# Patient Record
Sex: Female | Born: 1950
Health system: Southern US, Community
[De-identification: ages and names within clinical notes are randomized; demographics above are authoritative.]

## PROBLEM LIST (undated history)

## (undated) DIAGNOSIS — J329 Chronic sinusitis, unspecified: Secondary | ICD-10-CM

## (undated) DIAGNOSIS — E039 Hypothyroidism, unspecified: Secondary | ICD-10-CM

## (undated) DIAGNOSIS — E785 Hyperlipidemia, unspecified: Secondary | ICD-10-CM

## (undated) DIAGNOSIS — G4700 Insomnia, unspecified: Secondary | ICD-10-CM

## (undated) DIAGNOSIS — K649 Unspecified hemorrhoids: Secondary | ICD-10-CM

## (undated) DIAGNOSIS — R519 Headache, unspecified: Secondary | ICD-10-CM

## (undated) DIAGNOSIS — M8580 Other specified disorders of bone density and structure, unspecified site: Secondary | ICD-10-CM

## (undated) DIAGNOSIS — F411 Generalized anxiety disorder: Secondary | ICD-10-CM

## (undated) DIAGNOSIS — F909 Attention-deficit hyperactivity disorder, unspecified type: Secondary | ICD-10-CM

## (undated) DIAGNOSIS — R112 Nausea with vomiting, unspecified: Secondary | ICD-10-CM

## (undated) DIAGNOSIS — I10 Essential (primary) hypertension: Secondary | ICD-10-CM

## (undated) DIAGNOSIS — F32A Depression, unspecified: Secondary | ICD-10-CM

## (undated) DIAGNOSIS — J302 Other seasonal allergic rhinitis: Secondary | ICD-10-CM

## (undated) DIAGNOSIS — R7303 Prediabetes: Secondary | ICD-10-CM

## (undated) HISTORY — DX: Hyperlipidemia, unspecified: E78.5

## (undated) HISTORY — PX: AUGMENTATION MAMMAPLASTY: SUR837

## (undated) HISTORY — DX: Other specified disorders of bone density and structure, unspecified site: M85.80

## (undated) HISTORY — DX: Hypothyroidism, unspecified: E03.9

## (undated) HISTORY — DX: Generalized anxiety disorder: F41.1

## (undated) HISTORY — PX: LASIK: SHX215

## (undated) HISTORY — PX: VAGINAL HYSTERECTOMY: SHX2639

## (undated) HISTORY — DX: Chronic sinusitis, unspecified: J32.9

---

## 2003-02-24 ENCOUNTER — Other Ambulatory Visit: Admission: RE | Admit: 2003-02-24 | Discharge: 2003-02-24 | Payer: Self-pay | Admitting: Obstetrics and Gynecology

## 2003-02-26 ENCOUNTER — Encounter: Payer: Self-pay | Admitting: Obstetrics and Gynecology

## 2003-02-26 ENCOUNTER — Encounter: Admission: RE | Admit: 2003-02-26 | Discharge: 2003-02-26 | Payer: Self-pay | Admitting: Obstetrics and Gynecology

## 2004-04-19 ENCOUNTER — Other Ambulatory Visit: Admission: RE | Admit: 2004-04-19 | Discharge: 2004-04-19 | Payer: Self-pay | Admitting: Obstetrics and Gynecology

## 2004-04-22 ENCOUNTER — Encounter: Admission: RE | Admit: 2004-04-22 | Discharge: 2004-04-22 | Payer: Self-pay | Admitting: Obstetrics and Gynecology

## 2004-07-10 ENCOUNTER — Emergency Department: Payer: Self-pay | Admitting: Emergency Medicine

## 2004-07-25 ENCOUNTER — Ambulatory Visit (HOSPITAL_COMMUNITY): Admission: RE | Admit: 2004-07-25 | Discharge: 2004-07-25 | Payer: Self-pay | Admitting: Gastroenterology

## 2005-05-23 ENCOUNTER — Other Ambulatory Visit: Admission: RE | Admit: 2005-05-23 | Discharge: 2005-05-23 | Payer: Self-pay | Admitting: Obstetrics and Gynecology

## 2006-01-25 ENCOUNTER — Emergency Department (HOSPITAL_COMMUNITY): Admission: EM | Admit: 2006-01-25 | Discharge: 2006-01-25 | Payer: Self-pay | Admitting: Emergency Medicine

## 2006-03-18 ENCOUNTER — Emergency Department: Payer: Self-pay | Admitting: Emergency Medicine

## 2006-11-15 ENCOUNTER — Encounter: Admission: RE | Admit: 2006-11-15 | Discharge: 2006-11-15 | Payer: Self-pay | Admitting: Obstetrics and Gynecology

## 2010-03-16 ENCOUNTER — Ambulatory Visit: Payer: Self-pay | Admitting: Family Medicine

## 2010-03-16 DIAGNOSIS — H68019 Acute Eustachian salpingitis, unspecified ear: Secondary | ICD-10-CM | POA: Insufficient documentation

## 2010-05-20 ENCOUNTER — Ambulatory Visit: Payer: Self-pay | Admitting: Family Medicine

## 2010-07-10 ENCOUNTER — Ambulatory Visit: Payer: Self-pay | Admitting: Family Medicine

## 2010-07-10 DIAGNOSIS — J309 Allergic rhinitis, unspecified: Secondary | ICD-10-CM | POA: Insufficient documentation

## 2010-10-02 ENCOUNTER — Ambulatory Visit
Admission: RE | Admit: 2010-10-02 | Discharge: 2010-10-02 | Payer: Self-pay | Source: Home / Self Care | Attending: Family Medicine | Admitting: Family Medicine

## 2010-10-25 NOTE — Assessment & Plan Note (Signed)
Summary: EAR ACHE, SINUS/EVM   Vital Signs:  Patient profile:   60 year old female Height:      62.25 inches Weight:      135 pounds O2 Sat:      96 % on Room air Temp:     97.6 degrees F oral Pulse rate:   77 / minute Pulse rhythm:   regular Resp:     18 per minute BP sitting:   119 / 81  (right arm)  Vitals Entered By: Levonne Spiller EMT-P (May 20, 2010 11:42 AM)  O2 Flow:  Room air  Reason for Visit Ear Ache, Sinuses / rwt  Allergies (verified): No Known Drug Allergies   Complete Medication List: 1)  Celexa 10 Mg Tabs (Citalopram hydrobromide) 2)  Lipitor 10 Mg Tabs (Atorvastatin calcium) 3)  Azithromycin 250 Mg Tabs (Azithromycin) .... 2 by mouth day 1 then 1 by mouth daily x 7 days for infection  Other Orders: Solumedrol up to 125mg  (Z6109) Admin of Therapeutic Inj (IM or [place]) ([address]) Prescriptions: AZITHROMYCIN 250 MG TABS (AZITHROMYCIN) 2 by mouth day 1 then 1 by mouth daily x 7 days for infection  #9 x 0   Entered and Authorized by:   Tacey Ruiz MD   Signed by:   Tacey Ruiz MD on 05/20/2010   Method used:   [facility] to        [id number]       [address]       [place], [place]  [address]       Ph: [phone]       Fax: [phone]   RxID:   [id number]   History of Present Illness History from: patient Reason for visit: see chief complaint Chief Complaint: Ear Pain/sinus pressure History of Present Illness: For about a week patient has had pain and pressure behind her eyes and swelling. She has had bilateral ear pain, L > R. Has had thick post nasal drainage. No f/c. + halitosis and coated tongue. Has been around her 51 month old grandson and he was sick. Did feel better since last visit. She is using nasonex and claritin daily. Reports that pain is worse this time.     Plan New Medications/Changes: AZITHROMYCIN 250 MG TABS (AZITHROMYCIN) 2 by mouth day 1 then 1 by mouth daily x 7 days for infection  #9 x 0,  05/20/2010, Tacey Ruiz MD  New Orders: Solumedrol up to 125mg  [J2930] Admin of Therapeutic Inj (IM or [place]) [[address]644] Est. Patient Level II [03474] Planning Comments:   She was told that she may continue to take mucinex with increased fluid intake, nasal spray and loratadine. If the pain continues or recurs then it was suggested that she follow with her ENT.   The patient and/or caregiver has been counseled thoroughly with regard to medications prescribed including dosage, schedule, interactions, rationale for use, and possible side effects and they verbalize understanding.  Diagnoses and expected course of recovery discussed and will return if not improved as expected or if the condition worsens. Patient and/or caregiver verbalized understanding.       Medication Administration  Injection # 1:    Medication: Solumedrol up to 125mg     Diagnosis: ACUTE EUSTACHIAN SALPINGITIS (ICD-381.51)    Route: IM    Site: RUOQ gluteus    Exp Date: 10/25/2012    Lot #: Velva Harman    Mfr: Pfizer    Patient tolerated injection without complications    Given by:  Levonne Spiller EMT-P (May 20, 2010 12:06 PM)  Orders Added: 1)  Solumedrol up to 125mg  [J2930] 2)  Admin of Therapeutic Inj (IM or [place]) [96372] 3)  Est. Patient Level II [47425]     REVIEW OF SYSTEMS Constitutional Symptoms       Complains of fatigue.     Denies fever, chills, and night sweats.  Eyes       Complains of eye drainage.      Denies change in vision and eye pain. Ear/Nose/Throat/Mouth       Complains of ear pain, frequent runny nose, sinus problems, and hoarseness.      Denies ear discharge, dizziness, frequent nose bleeds, sore throat, and tooth pain or bleeding.  Respiratory       Denies dry cough, productive cough, wheezing, and shortness of breath.     Neurological       Complains of headaches.     Physical Exam General appearance: well developed, well nourished, no acute distress Eyes: swollen lids and below eyes +  swollen Ears: bilateral TMs retracted - left canal with erythema and painful bulge on bottom - tender with tug on the ear Oral/Pharynx: tongue normal, posterior pharynx without erythema or exudate Neck: neck supple,  trachea midline, no masses - tender below both ears, no nodes appreciated.  Chest/Lungs: no rales, wheezes, or rhonchi bilateral, breath sounds equal without effort Heart: regular rate and  rhythm, no murmur Skin: no obvious rashes or lesions MSE: oriented to time, place, and person   The patient was informed that there is no on-call provider or services available at this clinic during off-hours (when the clinic is closed).  If the patient developed a problem or concern that required immediate attention, the patient was advised to go the the nearest available urgent care or emergency department for medical care.  The patient verbalized understanding.     The risks, benefits and possible side effects of the treatments and tests were explained clearly to the patient and the patient verbalized understanding.

## 2010-10-25 NOTE — Assessment & Plan Note (Signed)
Summary: EAR INFECTION/EVM   Vital Signs:  Patient profile:   60 year old female Height:      62.25 inches Weight:      133 pounds BMI:     24.22 O2 Sat:      97 % on Room air Pulse rate:   78 / minute Resp:     16 per minute BP sitting:   123 / 83  (left arm)  Vitals Entered By: Adella Hare LPN (July 10, 2010 1:40 PM)  O2 Flow:  Room air CC: left ear pain, sinus pressure and drainage x 1 week Is Patient Diabetic? No Pain Assessment Patient in pain? no        Chief Complaint:  left ear pain and sinus pressure and drainage x 1 week.  History of Present Illness: This patient presented today because she's been concerned about left ear pain and sinus pressure with a lot of drainage. She reports that it has been present for approximately 10 days to 2 weeks. She reports that it is not improving. She reports that she typically will get these types of symptoms around this time of the year. She does have a history of allergic rhinitis. She does plan to travel next week to see family members and came to be evaluated for sinus infection. She says that when her teeth start her she knows that she is in trouble for having infection.  She reported that when she got an infection a couple months ago and had a shot of steroid that seemed to help her symptoms considerably. she received this when she saw Dr. Tacey Ruiz.     Current Medications (verified): 1)  Celexa 10 Mg Tabs (Citalopram Hydrobromide) 2)  Lipitor 10 Mg Tabs (Atorvastatin Calcium) 3)  Levothyroxine Sodium 50 Mcg Tabs (Levothyroxine Sodium) .... One Tab By Mouth Once Daily  Allergies (verified): No Known Drug Allergies  Past History:  Past Medical History: Allergic rhinitis history of eustachian tube dysfunction  Review of Systems       The patient complains of hoarseness and headaches.  The patient denies anorexia, fever, weight loss, weight gain, vision loss, decreased hearing, chest pain, syncope, dyspnea on  exertion, peripheral edema, prolonged cough, hemoptysis, abdominal pain, melena, hematochezia, severe indigestion/heartburn, hematuria, incontinence, genital sores, muscle weakness, suspicious skin lesions, transient blindness, difficulty walking, depression, unusual weight change, abnormal bleeding, enlarged lymph nodes, angioedema, breast masses, and testicular masses.         complains of tooth pain, left ear pain and congestion and postnasal drainage ENT:  Complains of decreased hearing, earache, nasal congestion, postnasal drainage, sinus pressure, and sore throat.  Physical Exam  General:  well developed, well nourished, no acute distress Head:  Normocephalic and atraumatic without obvious abnormalities. No apparent alopecia or balding. Eyes:  No corneal or conjunctival inflammation noted. EOMI. Perrla. Funduscopic exam benign, without hemorrhages, exudates or papilledema. Vision grossly normal. Ears:  External ear exam shows no significant lesions or deformities.  Otoscopic examination reveals clear canals, tympanic membranes are intact bilaterally without bulging, retraction, inflammation or discharge. Hearing is grossly normal bilaterally. Nose:  external erythema, nasal dischargemucosal pallor, mucosal erythema, mucosal edema, L frontal sinus tenderness, and L maxillary sinus tenderness.   Mouth:  no gingival abnormalities, fair dentition, pharyngeal exudate, posterior lymphoid hypertrophy, and postnasal drip.   Neck:  neck supple,  trachea midline, no masses - tender below both ears, no nodes appreciated.  Chest Wall:  No deformities, masses, or tenderness noted. Lungs:  Normal  respiratory effort, chest expands symmetrically. Lungs are clear to auscultation, no crackles or wheezes. Heart:  Normal rate and regular rhythm. S1 and S2 normal without gallop, murmur, click, rub or other extra sounds. Abdomen:  Bowel sounds positive,abdomen soft and non-tender without masses, organomegaly or  hernias noted. Pulses:  R and L carotid,radial,femoral,dorsalis pedis and posterior tibial pulses are full and equal bilaterally Neurologic:  No cranial nerve deficits noted. Station and gait are normal. Plantar reflexes are down-going bilaterally. DTRs are symmetrical throughout. Sensory, motor and coordinative functions appear intact.   Impression & Recommendations:  Problem # 1:  ACUTE SINUSITIS, UNSPECIFIED (ICD-461.9)  Her updated medication list for this problem includes:    Amoxicillin 875 Mg Tabs (Amoxicillin) .Marland Kitchen... Take 1 by mouth two times a day until all completed    Flonase 50 Mcg/act Susp (Fluticasone propionate) .Marland Kitchen..Marland Kitchen Two sprays per nostril once daily  Orders: Admin of Therapeutic Inj  intramuscular or subcutaneous (16109)  Problem # 2:  EAR PAIN, LEFT (ICD-388.70)  Her updated medication list for this problem includes:    Amoxicillin 875 Mg Tabs (Amoxicillin) .Marland Kitchen... Take 1 by mouth two times a day until all completed  Problem # 3:  ALLERGIC RHINITIS (ICD-477.9)  Her updated medication list for this problem includes:    Flonase 50 Mcg/act Susp (Fluticasone propionate) .Marland Kitchen..Marland Kitchen Two sprays per nostril once daily  Problem # 4:  ACUTE EUSTACHIAN SALPINGITIS (ICD-381.51)  Complete Medication List: 1)  Celexa 10 Mg Tabs (Citalopram hydrobromide) 2)  Lipitor 10 Mg Tabs (Atorvastatin calcium) 3)  Levothyroxine Sodium 50 Mcg Tabs (Levothyroxine sodium) .... One tab by mouth once daily 4)  Amoxicillin 875 Mg Tabs (Amoxicillin) .... Take 1 by mouth two times a day until all completed 5)  Flonase 50 Mcg/act Susp (Fluticasone propionate) .... Two sprays per nostril once daily  Patient Instructions: 1)  Take 400-600mg  of Ibuprofen (Advil, Motrin) with food every 4-6 hours as needed for relief of pain or comfort of fever. 2)  Oral Rehydration Solution: drink 1/2 ounce every 15 minutes. If tolerated afert 1 hour, drink 1 ounce every 15 minutes. As you can tolerate, keep adding 1/2  ounce every 15 minutes, up to a total of 2-4 ounces. Contact the office if unable to tolerate oral solution, if you keep vomiting, or you continue to have signs of dehydration. 3)  Take your antibiotic as prescribed until ALL of it is gone, but stop if you develop a rash or swelling and contact our office as soon as possible. 4)  Please schedule an appointment with your primary doctor in : 5-7 days 5)  The risks, benefits and possible side effects were clearly explained and discussed with the patient.  The patient verbalized clear understanding.  The patient was given instructions to return if symptoms don't improve, worsen or new changes develop.  If it is not during clinic hours and the patient cannot get back to this clinic then the patient was told to seek medical care at an available urgent care or emergency department.  The patient verbalized understanding.   6)  The patient was informed that there is no on-call provider or services available at this clinic during off-hours (when the clinic is closed).  If the patient developed a problem or concern that required immediate attention, the patient was advised to go the the nearest available urgent care or emergency department for medical care.  The patient verbalized understanding.   7)  It was clearly explained to the patient that this  [facility] is not intended to be a primary care clinic.  The patient is always better served by the continuity of care and the provider/patient relationships developed with their dedicated primary care provider.  The patient was told to be sure to follow up as soon as possible with their primary care provider to discuss treatments received and to receive further examination and testing.  The pt said she understood. Prescriptions: FLONASE 50 MCG/ACT SUSP (FLUTICASONE PROPIONATE) two sprays per nostril once daily  #1 x 1   Entered and Authorized by:   Standley Dakins MD   Signed by:   Standley Dakins MD on  07/10/2010   Method used:   Electronically to        [id number]       [address]       [place], [place]  [address]       Ph: [phone]       Fax: [phone]   RxID:   [id number] AMOXICILLIN 875 MG TABS (AMOXICILLIN) take 1 by mouth two times a day until all completed  #20 x 1   Entered and Authorized by:   Standley Dakins MD   Signed by:   Standley Dakins MD on 07/10/2010   Method used:   Electronically to        [id number]       [address]       [place], [place]  [address]       Ph: [phone]       Fax: [phone]   RxID:   [id number]    Medication Administration  Injection # 1:    Medication: Depo- Medrol 40mg     Diagnosis: ACUTE SINUSITIS, UNSPECIFIED (ICD-461.9)    Route: IM    Site: RUOQ gluteus    Exp Date: 4/12    Lot #: OBPUT    Mfr: Pharmacia    Comments: 40mg  given from 80 mg vial    Patient tolerated injection without complications    Given by: Adella Hare LPN (July 10, 2010 2:10 PM)  Orders Added: 1)  Admin of Therapeutic Inj  intramuscular or subcutaneous [96372]     Medication Administration  Injection # 1:    Medication: Depo- Medrol 40mg     Diagnosis: ACUTE SINUSITIS, UNSPECIFIED (ICD-461.9)    Route: IM    Site: RUOQ gluteus    Exp Date: 4/12    Lot #: OBPUT    Mfr: Pharmacia    Comments: 40mg  given from 80 mg vial    Patient tolerated injection without complications    Given by: Adella Hare LPN (July 10, 2010 2:10 PM)  Orders Added: 1)  Admin of Therapeutic Inj  intramuscular or subcutaneous [09323]

## 2010-10-25 NOTE — Assessment & Plan Note (Signed)
Summary: SINUS/EAR/EVM   Vital Signs:  Patient Profile:   60 Years Old Female CC:      Cold & URI symptoms Height:     62.25 inches Weight:      133 pounds BMI:     24.22 O2 Sat:      98 % O2 treatment:    Room Air Temp:     98.1 degrees F oral Pulse rate:   74 / minute Pulse rhythm:   regular Resp:     18 per minute BP sitting:   141 / 99  (left arm)  Pt. in pain?   yes    Location:   head    Intensity:   5    Type:       aching  Vitals Entered By: Levonne Spiller EMT-P (March 16, 2010 12:23 PM)              Is Patient Diabetic? No      Current Allergies: No known allergies History of Present Illness History from: patient Reason for visit: see chief complaint Chief Complaint: Cold & URI symptoms History of Present Illness: For about 1 week, has been having pain below her left ear. She has had sore throat and hoarsness for last 2-3 days. No f/c. + fatigue. + thick post nasal drainage. She takes loratadine every night. H/o sinus surgery years ago. No headaches now, had facial pressure when it first started.   REVIEW OF SYSTEMS Constitutional Symptoms      Denies fever, chills, night sweats, weight loss, weight gain, and fatigue.  Eyes       Denies change in vision, eye pain, eye discharge, glasses, contact lenses, and eye surgery. Ear/Nose/Throat/Mouth       Complains of ear pain, sinus problems, sore throat, and hoarseness.      Denies hearing loss/aids, change in hearing, ear discharge, dizziness, frequent runny nose, frequent nose bleeds, and tooth pain or bleeding.  Respiratory       Complains of productive cough.      Denies dry cough, wheezing, shortness of breath, asthma, bronchitis, and emphysema/COPD.      Comments: green Cardiovascular       Denies murmurs, chest pain, and tires easily with exhertion.    Gastrointestinal       Denies stomach pain, nausea/vomiting, diarrhea, constipation, blood in bowel movements, and indigestion. Genitourniary       Denies  painful urination, kidney stones, and loss of urinary control. Neurological       Denies paralysis, seizures, and fainting/blackouts. Musculoskeletal       Denies muscle pain, joint pain, joint stiffness, decreased range of motion, redness, swelling, muscle weakness, and gout.  Skin       Denies bruising, unusual mles/lumps or sores, and hair/skin or nail changes.  Psych       Denies mood changes, temper/anger issues, anxiety/stress, speech problems, depression, and sleep problems.  Past History:  Past Surgical History: Sinus surgery : about 2000  Family History: Mother: [id number] Father: [id number] Parkinsons  Social History: Alcohol use-yes Married Physical Exam General appearance: well developed, well nourished, no acute distress Head: non tender maxillary sinuses Ears: fluid noted without inflammation bilateral TM L>R Oral/Pharynx: tongue normal, posterior pharynx without erythema or exudate Neck: neck supple,  trachea midline, no masses - tender left cervical lymphnodes Chest/Lungs: no rales, wheezes, or rhonchi bilateral, breath sounds equal without effort Heart: regular rate and  rhythm, no murmur MSE: oriented to time, place, and person Assessment  New Problems: ACUTE EUSTACHIAN SALPINGITIS (ICD-381.51)   Plan New Medications/Changes: ZITHROMAX Z-PAK 250 MG TABS (AZITHROMYCIN) as directed  #1 pk x 0, 03/16/2010, Tacey Ruiz MD  New Orders: Solumedrol up to 125mg  [J2930] Admin of Therapeutic Inj (IM or [place]) [46962] Planning Comments:   Advised patient to continue taking loratadine OTC or to try zytec otc.   The patient and/or caregiver has been counseled thoroughly with regard to medications prescribed including dosage, schedule, interactions, rationale for use, and possible side effects and they verbalize understanding.  Diagnoses and expected course of recovery discussed and will return if not improved as expected or if the condition worsens. Patient and/or caregiver  verbalized understanding.  Prescriptions: ZITHROMAX Z-PAK 250 MG TABS (AZITHROMYCIN) as directed  #1 pk x 0   Entered and Authorized by:   Tacey Ruiz MD   Signed by:   Tacey Ruiz MD on 03/16/2010   Method used:   Electronically to        [id number]       [address]       [place], [place]  [address]       Ph: [phone]       Fax: [phone]   RxID:   [id number]   Orders Added: 1)  Solumedrol up to 125mg  [J2930] 2)  Admin of Therapeutic Inj (IM or [place]) [84166]  The risks, benefits and possible side effects of the treatments and tests were explained clearly to the patient and the patient verbalized understanding.  The patient was informed that there is no on-call provider or services available at this clinic during off-hours (when the clinic is closed).  If the patient developed a problem or concern that required immediate attention, the patient was advised to go the the nearest available urgent care or emergency department for medical care.  The patient verbalized understanding.

## 2010-10-27 NOTE — Assessment & Plan Note (Signed)
Summary: SINUS INFECTION/EVM   Vital Signs:  Patient profile:   60 year old female Height:      63 inches Weight:      138 pounds BMI:     24.53 O2 Sat:      96 % on Room air Temp:     98.2 degrees F oral Pulse rate:   87 / minute BP sitting:   139 / 79  (left arm) Cuff size:   regular  Vitals Entered By: Haze Boyden, CMA (October 02, 2010 3:19 PM)  O2 Flow:  Room air  Serial Vital Signs/Assessments:  Time      Position  BP       Pulse  Resp  Temp     By 330                 129/78                         Haze Boyden, CMA  CC: sinus infection   Chief Complaint:  sinus infection.  History of Present Illness: The patient is presenting today with complaints of 10 days of sinus pressure and drainage.  She also reports frontal and maxillary pain and pressure. She has been taking Sudafed with only minimal relief.  She has been having allergy symptoms as well including sneezing, runny nose and ears feel full especially the left ear.  No fever but pt has had pain in the teeth.  She also feels tired.  She has been using Xyzal 5 mg every night.  She has not been using the fluticasone spray recently because of having run out of the medication.    Current Medications (verified): 1)  Celexa 10 Mg Tabs (Citalopram Hydrobromide) 2)  Lipitor 10 Mg Tabs (Atorvastatin Calcium) 3)  Levothyroxine Sodium 50 Mcg Tabs (Levothyroxine Sodium) .... One Tab By Mouth Once Daily 4)  Xyzal 5 Mg Tabs (Levocetirizine Dihydrochloride) .... Take 1/2-1 Tablet At Bedtime As Needed  Allergies (verified): No Known Drug Allergies  Past History:  Family History: Last updated: 03/16/2010 Mother: A85 Father: D18 Parkinsons  Social History: Last updated: 03/16/2010 Alcohol use-yes Married  Past medical, surgical, family and social histories (including risk factors) reviewed, and no changes noted (except as noted below).  Past Medical History: Reviewed history from 07/10/2010 and no changes  required. Allergic rhinitis history of eustachian tube dysfunction  Past Surgical History: Reviewed history from 03/16/2010 and no changes required. Sinus surgery : about 2000 History of Present Illness Chief Complaint: sinus infection    Family History: Reviewed history from 03/16/2010 and no changes required. Mother: A73 Father: D78 Parkinsons  Social History: Reviewed history from 03/16/2010 and no changes required. Alcohol use-yes Married  Review of Systems Eyes:  Denies blurring, discharge, double vision, eye irritation, eye pain, halos, itching, light sensitivity, red eye, vision loss-1 eye, and vision loss-both eyes. ENT:  Complains of earache, nasal congestion, postnasal drainage, and sinus pressure; denies difficulty swallowing, ear discharge, hoarseness, nosebleeds, and ringing in ears. CV:  Denies bluish discoloration of lips or nails, chest pain or discomfort, difficulty breathing at night, difficulty breathing while lying down, fainting, fatigue, leg cramps with exertion, lightheadness, near fainting, palpitations, shortness of breath with exertion, swelling of feet, swelling of hands, and weight gain. Resp:  Denies chest discomfort, chest pain with inspiration, cough, coughing up blood, excessive snoring, hypersomnolence, morning headaches, pleuritic, shortness of breath, sputum productive, and wheezing. GI:  Denies abdominal pain, bloody  stools, change in bowel habits, constipation, dark tarry stools, diarrhea, excessive appetite, gas, hemorrhoids, indigestion, loss of appetite, nausea, vomiting, vomiting blood, and yellowish skin color. MS:  Denies joint pain, joint redness, joint swelling, loss of strength, low back pain, mid back pain, muscle aches, muscle , cramps, muscle weakness, stiffness, and thoracic pain.  Physical Exam  General:  Well-developed,well-nourished,in no acute distress; alert,appropriate and cooperative throughout examination Head:  Normocephalic  and atraumatic without obvious abnormalities. No apparent alopecia or balding. Eyes:  No corneal or conjunctival inflammation noted. EOMI. Perrla. Funduscopic exam benign, without hemorrhages, exudates or papilledema. Vision grossly normal. Ears:  fluid behind both TMs  Nose:  bilateral swollen, red nasal turbinates with congestion seen that is thick and yellow Mouth:  Oral mucosa and oropharynx without lesions or exudates.  Teeth in good repair. Neck:  No deformities, masses, or tenderness noted. Lungs:  Normal respiratory effort, chest expands symmetrically. Lungs are clear to auscultation, no crackles or wheezes. Heart:  Normal rate and regular rhythm. S1 and S2 normal without gallop, murmur, click, rub or other extra sounds. Neurologic:  No cranial nerve deficits noted. Station and gait are normal. Plantar reflexes are down-going bilaterally. DTRs are symmetrical throughout. Sensory, motor and coordinative functions appear intact. Cervical Nodes:  No lymphadenopathy noted Psych:  Cognition and judgment appear intact. Alert and cooperative with normal attention span and concentration. No apparent delusions, illusions, hallucinations   Problems:  Medical Problems Added: 1)  Dx of Acute Sinusitis, Unspecified  (ICD-461.9)  Impression & Recommendations:  Problem # 1:  ACUTE SINUSITIS, UNSPECIFIED (ICD-461.9)  Her updated medication list for this problem includes:    Amoxicillin 500 Mg Caps (Amoxicillin) .Marland Kitchen... 2 by mouth q 8 hours until completed    Fluticasone Propionate 50 Mcg/act Susp (Fluticasone propionate) .Marland Kitchen... 2 sprays per nostril once daily  Problem # 2:  ALLERGIC RHINITIS (ICD-477.9)  Her updated medication list for this problem includes:    Xyzal 5 Mg Tabs (Levocetirizine dihydrochloride) .Marland Kitchen... Take 1/2-1 tablet at bedtime as needed    Fluticasone Propionate 50 Mcg/act Susp (Fluticasone propionate) .Marland Kitchen... 2 sprays per nostril once daily  Orders: Depo- Medrol 40mg   (J1030)  Complete Medication List: 1)  Celexa 10 Mg Tabs (Citalopram hydrobromide) 2)  Lipitor 10 Mg Tabs (Atorvastatin calcium) 3)  Levothyroxine Sodium 50 Mcg Tabs (Levothyroxine sodium) .... One tab by mouth once daily 4)  Xyzal 5 Mg Tabs (Levocetirizine dihydrochloride) .... Take 1/2-1 tablet at bedtime as needed 5)  Amoxicillin 500 Mg Caps (Amoxicillin) .... 2 by mouth q 8 hours until completed 6)  Fluticasone Propionate 50 Mcg/act Susp (Fluticasone propionate) .... 2 sprays per nostril once daily  Patient Instructions: 1)  Take your antibiotic as prescribed until ALL of it is gone, but stop if you develop a rash or swelling and contact our office as soon as possible. 2)  Acute sinusitis symptoms for less than 10 days are not helped by antibiotics.Use warm moist compresses, and over the counter decongestants ( only as directed). Call if no improvement in 5-7 days, sooner if increasing pain, fever, or new symptoms. 3)  Check your Blood Pressure regularly. If it is above: 140/90 you should make an appointment. 4)  Go to the pharmacy and pick up your prescription (s).  It may take up to 30 mins for electronic prescriptions to be delivered to the pharmacy.  Please call if your pharmacy has not received your prescriptions after 30 minutes.   5)  The patient was informed that  there is no on-call provider or services available at this clinic during off-hours (when the clinic is closed).  If the patient developed a problem or concern that required immediate attention, the patient was advised to go the the nearest available urgent care or emergency department for medical care.  The patient verbalized understanding.    Prescriptions: FLUTICASONE PROPIONATE 50 MCG/ACT SUSP (FLUTICASONE PROPIONATE) 2 sprays per nostril once daily  #1 x 1   Entered and Authorized by:   Standley Dakins MD   Signed by:   Standley Dakins MD on 10/02/2010   Method used:   Electronically to        [id number]       [address]       [place], [place]  [address]       Ph: [phone]       Fax: [phone]   RxID:   [id number] AMOXICILLIN 500 MG CAPS (AMOXICILLIN) 2 by mouth q 8 hours until completed  #60 x 0   Entered and Authorized by:   Standley Dakins MD   Signed by:   Standley Dakins MD on 10/02/2010   Method used:   Electronically to        [id number] (retail)       [address]       [place], [place]  [address]       Ph: [phone]       Fax: [phone]   RxID:   [id number]    The risks, benefits and possible side effects were clearly explained and discussed with the patient.  The patient verbalized clear understanding.  The patient was given instructions to return if symptoms don't improve, worsen or new changes develop.  If it is not during clinic hours and the patient cannot get back to this clinic then the patient was told to seek medical care at an available urgent care or emergency department.  The patient verbalized understanding.    Medication Administration  Injection # 1:    Medication: Depo- Medrol 40mg     Route: IM    Site: RUOQ gluteus    Exp Date: 04/25/2011    Lot #: [id number]    Mfr: [employer]    Comments: pt showed no adverse reaction to the injection    Given by: Haze Boyden, CMA (October 02, 2010 3:45 PM)  Orders Added: 1)  Depo- Medrol 40mg  [J1030]

## 2010-11-01 ENCOUNTER — Telehealth (INDEPENDENT_AMBULATORY_CARE_PROVIDER_SITE_OTHER): Payer: Self-pay | Admitting: *Deleted

## 2010-11-10 NOTE — Progress Notes (Signed)
  Phone Note Call from Patient   Caller: Patient Reason for Call: Refill Medication Summary of Call: The patient called stating she needs a refill on antibiotics and nose spray.  You can send the prescription to [facility].  You can contact the patient at [phone] Initial call taken by: Dorna Leitz,  November 01, 2010 2:19 PM  Follow-up for Phone Call        Please call pt. back and have to get her RX to fax a Med refill to our office. Follow-up by: Levonne Spiller EMT-P,  November 01, 2010 2:57 PM

## 2010-12-14 ENCOUNTER — Ambulatory Visit: Payer: BC Managed Care – PPO | Admitting: Internal Medicine

## 2010-12-14 ENCOUNTER — Encounter: Payer: Self-pay | Admitting: Internal Medicine

## 2010-12-14 DIAGNOSIS — J309 Allergic rhinitis, unspecified: Secondary | ICD-10-CM

## 2010-12-14 DIAGNOSIS — H6980 Other specified disorders of Eustachian tube, unspecified ear: Secondary | ICD-10-CM

## 2010-12-22 NOTE — Assessment & Plan Note (Signed)
Summary: ear stop up    Prior Medication List:  CELEXA 10 MG TABS (CITALOPRAM HYDROBROMIDE)  LIPITOR 10 MG TABS (ATORVASTATIN CALCIUM)  LEVOTHYROXINE SODIUM 50 MCG TABS (LEVOTHYROXINE SODIUM) one tab by mouth once daily XYZAL 5 MG TABS (LEVOCETIRIZINE DIHYDROCHLORIDE) take 1/2-1 tablet at bedtime as needed FLUTICASONE PROPIONATE 50 MCG/ACT SUSP (FLUTICASONE PROPIONATE) 2 sprays per nostril once daily   Current Allergies: No known allergies History of Present Illness Chief Complaint: ears stopped up since facelift few days ago History of Present Illness: allergies particularly bad this spring and ran out of flonase. Ears feel full and wants them checked. Hearing sl altered. No pain  REVIEW OF SYSTEMS Constitutional Symptoms      Denies fever, chills, night sweats, weight loss, weight gain, and fatigue.  Eyes       Denies change in vision, eye pain, eye discharge, glasses, contact lenses, and eye surgery. Ear/Nose/Throat/Mouth       Denies hearing loss/aids, change in hearing, ear pain, ear discharge, dizziness, frequent runny nose, frequent nose bleeds, sinus problems, sore throat, hoarseness, and tooth pain or bleeding.  Respiratory       Denies dry cough, productive cough, wheezing, shortness of breath, asthma, bronchitis, and emphysema/COPD.  Cardiovascular       Denies murmurs, chest pain, and tires easily with exhertion.    Gastrointestinal       Denies stomach pain, nausea/vomiting, diarrhea, constipation, blood in bowel movements, and indigestion. Genitourniary       Denies painful urination, kidney stones, and loss of urinary control. Neurological       Denies paralysis, seizures, and fainting/blackouts. Musculoskeletal       Denies muscle pain, joint pain, joint stiffness, decreased range of motion, redness, swelling, muscle weakness, and gout.  Skin       Denies bruising, unusual mles/lumps or sores, and hair/skin or nail changes.  Psych       Denies mood changes,  temper/anger issues, anxiety/stress, speech problems, depression, and sleep problems.  Past History:  Past Medical History: Last updated: 07/10/2010 Allergic rhinitis history of eustachian tube dysfunction  Past Surgical History: Sinus surgery : about 2000 facelift 3/12 [facility] Physical Exam General appearance: well developed, well nourished, no acute distress Head: some bruising of face, mild from surg. Min swelling Eyes: conjunctivae and lids normal Pupils: equal, round, reactive to light Ears: normal TM's, move with some effort. Surg scars extend into EAC's. Not red or discharge Nasal: pale, boggy, swollen nasal turbinates Oral/Pharynx: tongue normal, posterior pharynx with clear pnd Neck: neck supple,  trachea midline, no nodes Chest/Lungs: no rales, wheezes, or rhonchi bilateral, breath sounds equal without effort Neurological: grossly intact and non-focal MSE: oriented to time, place, and person Assessment New Problems: DYSFUNCTION OF EUSTACHIAN TUBE (ICD-381.81) ALLERGIC RHINITIS (ICD-477.9)   Plan New Medications/Changes: FLUTICASONE PROPIONATE 50 MCG/ACT SUSP (FLUTICASONE PROPIONATE) 2 sprays per nostril once daily  #1 x 2, 12/14/2010, J. Juline Patch MD   The patient and/or caregiver has been counseled thoroughly with regard to medications prescribed including dosage, schedule, interactions, rationale for use, and possible side effects and they verbalize understanding.  Diagnoses and expected course of recovery discussed and will return if not improved as expected or if the condition worsens. Patient and/or caregiver verbalized understanding.  Prescriptions: FLUTICASONE PROPIONATE 50 MCG/ACT SUSP (FLUTICASONE PROPIONATE) 2 sprays per nostril once daily  #1 x 2   Entered and Authorized by:   J. Juline Patch MD   Signed by:   Shela Commons. Juline Patch MD  on 12/14/2010   Method used:   Electronically to        [id number]       [address]        [place], [place]  [address]       Ph: [phone]       Fax: [phone]   RxID:   [id number]   Patient Instructions: 1)  mucinex-D every 12 hrs. 2)  afrin generic 2 sprays in each nostril every 12 hours for 3 days maximum followed by 3 days of non-use. Continue cycle as needed to control nasal or ear congestion  3)  cont antihistamine.\par 4)  steam

## 2010-12-27 NOTE — Letter (Signed)
Summary: History Form  History Form   Imported By: Eugenio Hoes 12/19/2010 11:00:25  _____________________________________________________________________  External Attachment:    Type:   Image     Comment:   External Document

## 2011-02-10 NOTE — Op Note (Signed)
NAME:  Andrea [name], Andrea [name] NO.:  [id number]   MEDICAL RECORD NO.:  [id number]          PATIENT TYPE:  AMB   LOCATION:  ENDO                         FACILITY:  [facility]   PHYSICIAN:  Anselmo Rod, M.D.  DATE OF BIRTH:  [birth year]   DATE OF PROCEDURE:  07/25/2004  DATE OF DISCHARGE:                                 OPERATIVE REPORT   PROCEDURE:  Screening colonoscopy.   ENDOSCOPIST:  Anselmo Rod, M.D.   INSTRUMENT:  Olympus video colonoscope.   INDICATIONS FOR PROCEDURE:  A 60 year old white female with a family history  of colon cancer in a maternal aunt, undergoing screening colonoscopy to rule  out any polyps, masses, etc.   PRE-PROCEDURE PREPARATION:  Informed consent was procured from the patient.  Patient fasted for 8 hours prior to the procedure and prepped with a bottle  of magnesium citrate and a gallon of NuLytely the night prior to the  procedure.  Pre-procedure physical:  Patient had stable vital signs, neck  supple, chest clear to auscultation, S1/S2 regular, abdomen soft with normal  bowel sounds.   DESCRIPTION OF PROCEDURE:  The patient was placed in the left lateral  decubitus position, sedated with 100 mg of Demerol and 7 mg of Versed in  slow, incremental doses.  Once the patient was adequately sedated and  maintained on low flow oxygen, continuous cardiac monitoring; the Olympus  video colonoscope was advanced from the rectum to the cecum.  The patient  had a somewhat tortuous colon. Gentle abdominal pressure was applied to  reach the cecum. The appendiceal orifice and the ileocecal valve were  clearly visualized, photographed.  No masses or polyps were seen.  Sigmoid  diverticula was noted.  Small internal hemorrhoids were seen on retroflexion  in the rectum.  The patient tolerated the procedure well without  complications.   IMPRESSION:  1.  Small internal hemorrhoids.  2.  Sigmoid diverticulosis.  3.  No masses or polyps seen.   RECOMMENDATIONS:  1.  Continue a high fiber diet with liberal fluids intake.  2.  Repeat colonoscopy in the next 5 years unless the patient develops any      abnormal symptoms in the interim.  3.  Outpatient follow up as the need arises in the future.       JNM/MEDQ  D:  07/25/2004  T:  07/25/2004  Job:  [id number]   cc:   Marcelino Duster L. Vincente Poli, M.D.  [address]  [place]  [place] [address]  Fax: [phone]   Roland Earl  [address] [place]  [place] [address]  Fax: [phone]

## 2011-09-26 HISTORY — PX: HUMERUS FRACTURE SURGERY: SHX670

## 2012-04-09 ENCOUNTER — Emergency Department: Payer: Self-pay | Admitting: Internal Medicine

## 2012-04-16 ENCOUNTER — Ambulatory Visit: Payer: Self-pay | Admitting: Orthopedic Surgery

## 2013-10-08 ENCOUNTER — Other Ambulatory Visit: Payer: Self-pay | Admitting: Obstetrics and Gynecology

## 2013-10-08 DIAGNOSIS — R928 Other abnormal and inconclusive findings on diagnostic imaging of breast: Secondary | ICD-10-CM

## 2013-10-09 ENCOUNTER — Ambulatory Visit
Admission: RE | Admit: 2013-10-09 | Discharge: 2013-10-09 | Disposition: A | Discharge status: Home / Self Care | Payer: BC Managed Care – PPO | Source: Ambulatory Visit | Attending: Obstetrics and Gynecology | Admitting: Obstetrics and Gynecology

## 2013-10-09 DIAGNOSIS — R928 Other abnormal and inconclusive findings on diagnostic imaging of breast: Secondary | ICD-10-CM

## 2013-10-13 ENCOUNTER — Other Ambulatory Visit: Payer: BC Managed Care – PPO

## 2013-10-17 ENCOUNTER — Other Ambulatory Visit: Payer: BC Managed Care – PPO

## 2013-11-18 ENCOUNTER — Ambulatory Visit: Payer: Self-pay | Admitting: Otolaryngology

## 2014-05-29 ENCOUNTER — Other Ambulatory Visit: Payer: Self-pay | Admitting: Obstetrics and Gynecology

## 2014-05-29 DIAGNOSIS — N6489 Other specified disorders of breast: Secondary | ICD-10-CM

## 2014-07-01 ENCOUNTER — Ambulatory Visit
Admission: RE | Admit: 2014-07-01 | Discharge: 2014-07-01 | Disposition: A | Discharge status: Home / Self Care | Payer: BC Managed Care – PPO | Source: Ambulatory Visit | Attending: Obstetrics and Gynecology | Admitting: Obstetrics and Gynecology

## 2014-07-01 ENCOUNTER — Other Ambulatory Visit: Payer: Self-pay | Admitting: Obstetrics and Gynecology

## 2014-07-01 ENCOUNTER — Encounter (INDEPENDENT_AMBULATORY_CARE_PROVIDER_SITE_OTHER): Payer: Self-pay

## 2014-07-01 DIAGNOSIS — N6489 Other specified disorders of breast: Secondary | ICD-10-CM

## 2014-08-17 ENCOUNTER — Other Ambulatory Visit: Payer: Self-pay

## 2014-08-17 DIAGNOSIS — Z1231 Encounter for screening mammogram for malignant neoplasm of breast: Secondary | ICD-10-CM

## 2014-10-05 ENCOUNTER — Ambulatory Visit
Admission: RE | Admit: 2014-10-05 | Discharge: 2014-10-05 | Disposition: A | Discharge status: Home / Self Care | Payer: BLUE CROSS/BLUE SHIELD | Source: Ambulatory Visit

## 2014-10-05 ENCOUNTER — Other Ambulatory Visit: Payer: Self-pay

## 2014-10-05 DIAGNOSIS — Z1231 Encounter for screening mammogram for malignant neoplasm of breast: Secondary | ICD-10-CM

## 2015-01-12 NOTE — Op Note (Signed)
PATIENT NAME:  Andrea [name], Andrea [name] MR#:  [id number] DATE OF BIRTH:  [birth year]  DATE OF PROCEDURE:  04/16/2012  PREOPERATIVE DIAGNOSIS: Right proximal humerus fracture, comminuted, displaced.   POSTOPERATIVE DIAGNOSIS: Right proximal humerus fracture, comminuted, displaced.   PROCEDURE: Open reduction and internal fixation of right proximal humerus.   ANESTHESIA: General.   SURGEON: Laurene Footman, MD   DESCRIPTION OF PROCEDURE:  The patient was brought to the operating room and after adequate anesthesia was obtained, the patient was placed in the semirecumbent position with the shoulder table with the right arm draped free and the spider attachment to hold the arm during the procedure. After prepping and draping in the usual sterile fashion and C-arm being brought in to make sure we had good visualization of the fracture, appropriate patient identification and time-out procedures were completed. A lateral incision was made over the distal acromion extending down the arm and subcutaneous flaps developed. The junction between the anterior and lateral thirds of the deltoid were identified without much difficulty and this interval developed. The axillary nerve was identified approximately 65 mm from the distal end of the acromion, and a vessel loop placed around this to protect it during the remainder of the procedure. It was elevated off the bone for subsequent plate placement. Distal to the nerve, the lateral aspect of the humeral shaft was also exposed for subsequent placement of cortical screws. Next, at the proximal end of the incision bursa was debrided and a large hematoma evacuated to provide exposure of the proximal fragment. There was some comminution. There was a very large anterior medial spike of the shaft. After sutures were placed within the tuberosities, head rotation could be controlled better, and with a reduction clamp to hold the shaft fragment at its apex, the shaft could be reduced to  the head on both AP and lateral imaging. The Biomet S3 locking plate was then placed in the appropriate position with a K-wire fixing it in the center of the head. Two distal cortical screws were placed and there was solid fixation with these two screws. The proximal locking pegs were then placed, drilling through the fast guides, drilling the outer cortex, then hand drilling, and then placing the smooth locking pegs. After the six peg holes were filled and the arm was placed through a range of motion under fluoroscopy, it appeared that there was essentially anatomical reduction. The sutures placed through the greater tuberosity and supraspinatus were attached to the superior aspect of the plate, through one of the suture holes at the proximal end of the plate to help maintain alignment and prevent any possible displacement of the cuff. The wound was thoroughly irrigated. The axillary nerve was intact at the close of the case overlying the plate. The wound was closed using 0 Vicryl to close the interval on the deltoid superficially, 2-0 Vicryl subcutaneously. Another subcutaneous 4-0 Vicryl and a subcuticular 4-0 Monocryl followed by Dermabond. After this had dried, Telfa, ABD, and tape were applied along with a sling. The patient was sent to the recovery room in stable condition.   ESTIMATED BLOOD LOSS: 250 mL.   COMPLICATIONS: None.   SPECIMEN: None.   IMPLANT: Biomet S3 locking proximal humeral plate, three-hole.   CONDITION: To recovery room stable.    ____________________________ Laurene Footman, MD [email or web address] D: 04/16/2012 21:17:57 ET T: 04/17/2012 09:33:03 ET JOB#: [id number]  cc: Laurene Footman, MD, <Dictator> Laurene Footman MD ELECTRONICALLY SIGNED 04/17/2012 12:50

## 2015-09-02 ENCOUNTER — Other Ambulatory Visit: Payer: Self-pay

## 2015-09-02 DIAGNOSIS — Z9882 Breast implant status: Secondary | ICD-10-CM

## 2015-09-02 DIAGNOSIS — Z1231 Encounter for screening mammogram for malignant neoplasm of breast: Secondary | ICD-10-CM

## 2015-10-19 ENCOUNTER — Ambulatory Visit
Admission: RE | Admit: 2015-10-19 | Discharge: 2015-10-19 | Disposition: A | Discharge status: Home / Self Care | Payer: BLUE CROSS/BLUE SHIELD | Source: Ambulatory Visit

## 2015-10-19 DIAGNOSIS — Z1231 Encounter for screening mammogram for malignant neoplasm of breast: Secondary | ICD-10-CM

## 2015-10-19 DIAGNOSIS — Z9882 Breast implant status: Secondary | ICD-10-CM

## 2016-07-10 ENCOUNTER — Other Ambulatory Visit: Payer: Self-pay | Admitting: Obstetrics and Gynecology

## 2016-07-10 DIAGNOSIS — Z1231 Encounter for screening mammogram for malignant neoplasm of breast: Secondary | ICD-10-CM

## 2016-10-19 ENCOUNTER — Ambulatory Visit
Admission: RE | Admit: 2016-10-19 | Discharge: 2016-10-19 | Disposition: A | Discharge status: Home / Self Care | Payer: 59 | Source: Ambulatory Visit | Attending: Obstetrics and Gynecology | Admitting: Obstetrics and Gynecology

## 2016-10-19 DIAGNOSIS — Z1231 Encounter for screening mammogram for malignant neoplasm of breast: Secondary | ICD-10-CM

## 2017-07-16 ENCOUNTER — Other Ambulatory Visit: Payer: Self-pay | Admitting: Obstetrics and Gynecology

## 2017-07-16 DIAGNOSIS — Z1231 Encounter for screening mammogram for malignant neoplasm of breast: Secondary | ICD-10-CM

## 2017-10-23 ENCOUNTER — Ambulatory Visit
Admission: RE | Admit: 2017-10-23 | Discharge: 2017-10-23 | Disposition: A | Discharge status: Home / Self Care | Payer: 59 | Source: Ambulatory Visit | Attending: Obstetrics and Gynecology | Admitting: Obstetrics and Gynecology

## 2017-10-23 ENCOUNTER — Ambulatory Visit: Payer: 59

## 2017-10-23 DIAGNOSIS — Z1231 Encounter for screening mammogram for malignant neoplasm of breast: Secondary | ICD-10-CM

## 2018-03-05 DIAGNOSIS — R6882 Decreased libido: Secondary | ICD-10-CM | POA: Diagnosis not present

## 2018-03-13 ENCOUNTER — Ambulatory Visit (INDEPENDENT_AMBULATORY_CARE_PROVIDER_SITE_OTHER): Payer: 59 | Admitting: Primary Care

## 2018-03-13 ENCOUNTER — Encounter (INDEPENDENT_AMBULATORY_CARE_PROVIDER_SITE_OTHER): Payer: Self-pay

## 2018-03-13 ENCOUNTER — Encounter: Payer: Self-pay | Admitting: Primary Care

## 2018-03-13 VITALS — BP 119/64 | HR 82 | Temp 98.3°F | Ht 62.25 in | Wt 140.2 lb

## 2018-03-13 DIAGNOSIS — E785 Hyperlipidemia, unspecified: Secondary | ICD-10-CM

## 2018-03-13 DIAGNOSIS — B9689 Other specified bacterial agents as the cause of diseases classified elsewhere: Secondary | ICD-10-CM | POA: Diagnosis not present

## 2018-03-13 DIAGNOSIS — J019 Acute sinusitis, unspecified: Secondary | ICD-10-CM | POA: Diagnosis not present

## 2018-03-13 DIAGNOSIS — M8580 Other specified disorders of bone density and structure, unspecified site: Secondary | ICD-10-CM

## 2018-03-13 DIAGNOSIS — E039 Hypothyroidism, unspecified: Secondary | ICD-10-CM

## 2018-03-13 DIAGNOSIS — F411 Generalized anxiety disorder: Secondary | ICD-10-CM | POA: Diagnosis not present

## 2018-03-13 MED ORDER — AMOXICILLIN-POT CLAVULANATE 875-125 MG PO TABS
1.0000 | ORAL_TABLET | Freq: Two times a day (BID) | ORAL | 0 refills | Status: DC
Start: 2018-03-13 — End: 2018-05-15

## 2018-03-13 NOTE — Progress Notes (Signed)
Subjective:    Patient ID: Andrea [name], female    DOB: Apr 20, 1951, 67 y.o.   MRN: [id number]  HPI  Andrea [name] is a 67 year old female who presents today to establish care and discuss the problems mentioned below. Will obtain old records. She follows with her Gynecologist for her annual exams with her last evaluation being in January 2019  1) Hyperlipidemia: Currently managed on atorvastatin 20 mg. Last lipid panel was in January 2019 and was normal.  2) GAD: Currently managed on Lexapro 20 mg. She is currently following with Dr. Clovis Pu annually. Overall feels well managed.   3) Hypothyroidism: Currently managed on levothyroxine 50 mcg tablets. She takes her levothyroxine at bedtime.   4) Osteopenia: Currently managed on Evista and calcium and vitamin D. She exercises daily. She endorses that her last bone density scan was earlier this year and improved.   5) Sinus Pressure: Located to bilateral maxillary sinuses for which she's noticed for the past 2 weeks. She's doing sinus rinses and Flonase with some improvement. She's getting thick green mucous from her nasal cavity. She denies fevers, sore throat, cough. She does follow with ENT on an irregular basis for recurrent sinus infections, no recent evaluation.  Review of Systems  Eyes: Negative for visual disturbance.  Respiratory: Negative for shortness of breath.   Cardiovascular: Negative for chest pain.  Neurological: Negative for dizziness and headaches.  Psychiatric/Behavioral: The patient is not nervous/anxious.        Past Medical History:  Diagnosis Date  . GAD (generalized anxiety disorder)   . Hyperlipidemia   . Hypothyroidism   . Osteopenia   . Sinusitis      Social History   Socioeconomic History  . Marital status: Married    Spouse name: Not on file  . Number of children: Not on file  . Years of education: Not on file  . Highest education level: Not on file  Occupational History  . Not on file  Social Needs    . Financial resource strain: Not on file  . Food insecurity:    Worry: Not on file    Inability: Not on file  . Transportation needs:    Medical: Not on file    Non-medical: Not on file  Tobacco Use  . Smoking status: Not on file  Substance and Sexual Activity  . Alcohol use: Not on file  . Drug use: Not on file  . Sexual activity: Not on file  Lifestyle  . Physical activity:    Days per week: Not on file    Minutes per session: Not on file  . Stress: Not on file  Relationships  . Social connections:    Talks on phone: Not on file    Gets together: Not on file    Attends religious service: Not on file    Active member of club or organization: Not on file    Attends meetings of clubs or organizations: Not on file    Relationship status: Not on file  . Intimate partner violence:    Fear of current or ex partner: Not on file    Emotionally abused: Not on file    Physically abused: Not on file    Forced sexual activity: Not on file  Other Topics Concern  . Not on file  Social History Narrative   Married.   1 child. 2 grandchildren.   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   Enjoys exercising, spending time with family  Past Surgical History:  Procedure Laterality Date  . AUGMENTATION MAMMAPLASTY    . HUMERUS FRACTURE SURGERY  2013    Family History  Problem Relation Age of Onset  . Arthritis Mother   . COPD Mother   . Hypercholesterolemia Mother   . Dementia Mother   . Stroke Mother   . Prostate cancer Brother   . Cancer Maternal Grandmother        Oral    No Known Allergies  Current Outpatient Medications on File Prior to Visit  Medication Sig Dispense Refill  . atorvastatin (LIPITOR) 20 MG tablet     . calcium citrate-vitamin D (CITRACAL+D) 315-200 MG-UNIT tablet Take by mouth.    . cycloSPORINE (RESTASIS) 0.05 % ophthalmic emulsion Restasis 0.05 % eye drops in a dropperette    . escitalopram (LEXAPRO) 20 MG tablet     . fluticasone (FLONASE) 50  MCG/ACT nasal spray fluticasone propionate 50 mcg/actuation nasal spray,suspension    . levothyroxine (SYNTHROID, LEVOTHROID) 50 MCG tablet Take 50 mcg by mouth daily before breakfast.    . raloxifene (EVISTA) 60 MG tablet raloxifene 60 mg tablet    . RESTASIS 0.05 % ophthalmic emulsion      No current facility-administered medications on file prior to visit.     BP 119/64   Pulse 82   Temp 98.3 F (36.8 C) (Oral)   Ht 5' 2.25" (1.581 m)   Wt 140 lb 4 oz (63.6 kg)   SpO2 96%   BMI 25.45 kg/m    Objective:   Physical Exam  Constitutional: She appears well-nourished. She does not appear ill.  HENT:  Right Ear: Tympanic membrane and ear canal normal.  Left Ear: Tympanic membrane and ear canal normal.  Nose: Mucosal edema present. Right sinus exhibits maxillary sinus tenderness. Right sinus exhibits no frontal sinus tenderness. Left sinus exhibits maxillary sinus tenderness. Left sinus exhibits no frontal sinus tenderness.  Mouth/Throat: Oropharynx is clear and moist.  Neck: Neck supple.  Cardiovascular: Normal rate and regular rhythm.  Respiratory: Effort normal and breath sounds normal. She has no wheezes.  Skin: Skin is warm and dry.  Psychiatric: She has a normal mood and affect.           Assessment & Plan:  Acute Sinusitis:  Sinus pressure x 2 weeks, temporary improvement with OTC treatment. Exam today suspicious for acute sinusitis. Rx for Augmentin course sent to pharmacy. Continue nasal rinses and Flonase PRN.  Pleas Koch, NP

## 2018-03-13 NOTE — Patient Instructions (Signed)
Start Augmentin antibiotics for the infection Take 1 tablet by mouth twice daily for 10 days.  It was a pleasure to meet you today! Please don't hesitate to call or message me with any questions. Welcome to [facility]!

## 2018-03-13 NOTE — Assessment & Plan Note (Signed)
Managed on atorvastatin, continue same. Will obtain records for recent lipid panel .

## 2018-03-13 NOTE — Assessment & Plan Note (Signed)
Doing well on Evista, endorses last bone density can improved. Continue to monitor, follows with GYN

## 2018-03-13 NOTE — Assessment & Plan Note (Signed)
Discussed to take levothyroxine every morning with water only, no food or other medications for 30 minutes. She verbalized understanding. Endorses TSH from January 2019 normal, will obtain records.

## 2018-03-13 NOTE — Assessment & Plan Note (Signed)
Feels well managed on on Lexapro, continue same.

## 2018-05-14 ENCOUNTER — Ambulatory Visit: Payer: 59 | Admitting: Family Medicine

## 2018-05-14 DIAGNOSIS — Z0289 Encounter for other administrative examinations: Secondary | ICD-10-CM

## 2018-05-15 ENCOUNTER — Ambulatory Visit (INDEPENDENT_AMBULATORY_CARE_PROVIDER_SITE_OTHER): Payer: Medicare Other | Admitting: Family Medicine

## 2018-05-15 ENCOUNTER — Encounter: Payer: Self-pay | Admitting: Family Medicine

## 2018-05-15 VITALS — BP 126/68 | HR 72 | Temp 98.1°F | Ht 62.25 in | Wt 138.8 lb

## 2018-05-15 DIAGNOSIS — J0100 Acute maxillary sinusitis, unspecified: Secondary | ICD-10-CM | POA: Diagnosis not present

## 2018-05-15 DIAGNOSIS — J019 Acute sinusitis, unspecified: Secondary | ICD-10-CM | POA: Diagnosis not present

## 2018-05-15 DIAGNOSIS — B9689 Other specified bacterial agents as the cause of diseases classified elsewhere: Secondary | ICD-10-CM | POA: Diagnosis not present

## 2018-05-15 MED ORDER — AMOXICILLIN-POT CLAVULANATE 875-125 MG PO TABS: 1.0000 | ORAL_TABLET | Freq: Two times a day (BID) | ORAL | 0 refills | Status: DC

## 2018-05-15 NOTE — Progress Notes (Signed)
Subjective:    Patient ID: Andrea [name], female    DOB: 07-09-1951, 67 y.o.   MRN: [id number]  HPI Here for uri symptoms including cough and nasal congestion   Caught her sister's cold   Has had symptoms for 5-7 days  Not a smoker   Very congested  Green nasal d/c and phlegm Prod cough  No wheezing   Facial pain under eyes and her upper teeth hurt  Temp: 98.1 F (36.7 C)   Took some dayquil/nyquil   Drinking a lot of water    Patient Active Problem List   Diagnosis Date Noted  . Acute sinusitis 05/15/2018  . Hypothyroidism 03/13/2018  . Hyperlipidemia 03/13/2018  . GAD (generalized anxiety disorder) 03/13/2018  . Osteopenia 03/13/2018  . DYSFUNCTION OF EUSTACHIAN TUBE 12/14/2010  . ALLERGIC RHINITIS 07/10/2010   Past Medical History:  Diagnosis Date  . GAD (generalized anxiety disorder)   . Hyperlipidemia   . Hypothyroidism   . Osteopenia   . Sinusitis    Past Surgical History:  Procedure Laterality Date  . AUGMENTATION MAMMAPLASTY    . HUMERUS FRACTURE SURGERY  2013   Social History   Tobacco Use  . Smoking status: Never Smoker  . Smokeless tobacco: Never Used  Substance Use Topics  . Alcohol use: Not on file  . Drug use: Not on file   Family History  Problem Relation Age of Onset  . Arthritis Mother   . COPD Mother   . Hypercholesterolemia Mother   . Dementia Mother   . Stroke Mother   . Prostate cancer Brother   . Cancer Maternal Grandmother        Oral   No Known Allergies Current Outpatient Medications on File Prior to Visit  Medication Sig Dispense Refill  . atorvastatin (LIPITOR) 20 MG tablet     . calcium citrate-vitamin D (CITRACAL+D) 315-200 MG-UNIT tablet Take by mouth.    . cycloSPORINE (RESTASIS) 0.05 % ophthalmic emulsion Restasis 0.05 % eye drops in a dropperette    . escitalopram (LEXAPRO) 20 MG tablet     . fluticasone (FLONASE) 50 MCG/ACT nasal spray fluticasone propionate 50 mcg/actuation nasal spray,suspension    .  levothyroxine (SYNTHROID, LEVOTHROID) 50 MCG tablet Take 50 mcg by mouth daily before breakfast.    . raloxifene (EVISTA) 60 MG tablet raloxifene 60 mg tablet     No current facility-administered medications on file prior to visit.     Review of Systems  Constitutional: Positive for appetite change. Negative for fatigue and fever.  HENT: Positive for congestion, ear pain, postnasal drip, rhinorrhea, sinus pressure and sore throat. Negative for nosebleeds.   Eyes: Negative for pain, redness and itching.  Respiratory: Positive for cough. Negative for shortness of breath and wheezing.   Cardiovascular: Negative for chest pain.  Gastrointestinal: Negative for abdominal pain, diarrhea, nausea and vomiting.  Endocrine: Negative for polyuria.  Genitourinary: Negative for dysuria, frequency and urgency.  Musculoskeletal: Negative for arthralgias and myalgias.  Allergic/Immunologic: Negative for immunocompromised state.  Neurological: Positive for headaches. Negative for dizziness, tremors, syncope, weakness and numbness.  Hematological: Negative for adenopathy. Does not bruise/bleed easily.  Psychiatric/Behavioral: Negative for dysphoric mood. The patient is not nervous/anxious.        Objective:   Physical Exam  Constitutional: She appears well-developed and well-nourished. No distress.  Well appearing   HENT:  Head: Normocephalic and atraumatic.  Right Ear: External ear normal.  Left Ear: External ear normal.  Mouth/Throat: Oropharynx is clear and moist.  No oropharyngeal exudate.  Nares are injected and congested  Bilateral maxillary sinus tenderness (worse on the L)  Mild sinus tenderness- frontal bilat also Post nasal drip   Eyes: Pupils are equal, round, and reactive to light. Conjunctivae and EOM are normal. Right eye exhibits no discharge. Left eye exhibits no discharge.  Neck: Normal range of motion. Neck supple.  Cardiovascular: Normal rate and regular rhythm.  Pulmonary/Chest:  Effort normal and breath sounds normal. No stridor. No respiratory distress. She has no wheezes. She has no rales.  Good air exch  Lymphadenopathy:    She has no cervical adenopathy.  Neurological: She is alert. No cranial nerve deficit.  Skin: Skin is warm and dry. No rash noted.  Psychiatric: She has a normal mood and affect.          Assessment & Plan:   Problem List Items Addressed This Visit      Respiratory   Acute sinusitis - Primary    Maxillary primarily - s/p uri  Disc symptomatic care - see instructions on AVS  Cover with augmentin as directed Disc use of saline/warm compresses and expectorant prn Update if not starting to improve in a week or if worsening    Meds ordered this encounter  Medications  . amoxicillin-clavulanate (AUGMENTIN) 875-125 MG tablet    Sig: Take 1 tablet by mouth 2 (two) times daily.    Dispense:  14 tablet    Refill:  0          Relevant Medications   amoxicillin-clavulanate (AUGMENTIN) 875-125 MG tablet    Other Visit Diagnoses    Acute bacterial sinusitis       Relevant Medications   amoxicillin-clavulanate (AUGMENTIN) 875-125 MG tablet

## 2018-05-15 NOTE — Assessment & Plan Note (Signed)
Maxillary primarily - s/p uri  Disc symptomatic care - see instructions on AVS  Cover with augmentin as directed Disc use of saline/warm compresses and expectorant prn Update if not starting to improve in a week or if worsening    Meds ordered this encounter  Medications  . amoxicillin-clavulanate (AUGMENTIN) 875-125 MG tablet    Sig: Take 1 tablet by mouth 2 (two) times daily.    Dispense:  14 tablet    Refill:  0

## 2018-05-15 NOTE — Patient Instructions (Signed)
Drink fluids Breathe steam  Use netti pot or nasal saline  mucinex or other expectorant will help clear the mucous from sinuses and lungs Extra rest   Update if not starting to improve in a week or if worsening

## 2018-05-17 ENCOUNTER — Ambulatory Visit: Payer: No Typology Code available for payment source | Admitting: Family Medicine

## 2018-05-29 ENCOUNTER — Other Ambulatory Visit: Payer: Self-pay | Admitting: Primary Care

## 2018-05-29 DIAGNOSIS — J019 Acute sinusitis, unspecified: Principal | ICD-10-CM

## 2018-05-29 DIAGNOSIS — B9689 Other specified bacterial agents as the cause of diseases classified elsewhere: Secondary | ICD-10-CM

## 2018-05-29 MED ORDER — AMOXICILLIN-POT CLAVULANATE 875-125 MG PO TABS: 1.0000 | ORAL_TABLET | Freq: Two times a day (BID) | ORAL | 0 refills | Status: DC

## 2018-05-29 NOTE — Telephone Encounter (Signed)
Pt notified Rx sent and advise of Dr. Tower's comments  

## 2018-05-29 NOTE — Telephone Encounter (Signed)
I spoke with pt; pt still has head congestion in sinus area, still has prod cough with green phlegm, still facial pain, not as bad but still there. No wheezing or fever. [facility].

## 2018-05-29 NOTE — Telephone Encounter (Signed)
Copied from [place] [id number]. Topic: Quick Communication - Rx Refill/Question >> May 29, 2018  1:54 PM Reyne Dumas L wrote: Medication: amoxicillin-clavulanate (AUGMENTIN) 875-125 MG tablet  Has the patient contacted their pharmacy? No - pt isn't better and would like another round of antibiotics. (Agent: If no, request that the patient contact the pharmacy for the refill.) (Agent: If yes, when and what did the pharmacy advise?)  Preferred Pharmacy (with phone number or street name): [id number] [name], [place] [phone] (Phone) [phone] (Fax)  Agent: Please be advised that RX refills may take up to 3 business days. We ask that you follow-up with your pharmacy.

## 2018-05-29 NOTE — Telephone Encounter (Signed)
Let her know I extended it for 5 more days  If still no improvement after that please f/u with pcp

## 2018-07-23 DIAGNOSIS — Z23 Encounter for immunization: Secondary | ICD-10-CM | POA: Diagnosis not present

## 2018-08-08 ENCOUNTER — Encounter: Payer: Self-pay | Admitting: Psychiatry

## 2018-08-08 ENCOUNTER — Ambulatory Visit (INDEPENDENT_AMBULATORY_CARE_PROVIDER_SITE_OTHER): Payer: Medicare Other | Admitting: Psychiatry

## 2018-08-08 DIAGNOSIS — F325 Major depressive disorder, single episode, in full remission: Secondary | ICD-10-CM | POA: Diagnosis not present

## 2018-08-08 DIAGNOSIS — F411 Generalized anxiety disorder: Secondary | ICD-10-CM | POA: Diagnosis not present

## 2018-08-08 MED ORDER — ALPRAZOLAM 0.5 MG PO TABS: 0.5000 mg | ORAL_TABLET | Freq: Every evening | ORAL | 5 refills | Status: DC | PRN

## 2018-08-08 MED ORDER — ESCITALOPRAM OXALATE 20 MG PO TABS: 20.0000 mg | ORAL_TABLET | Freq: Every day | ORAL | 3 refills | Status: DC

## 2018-08-08 NOTE — Progress Notes (Signed)
[name] [id number] [birth year] 67 y.o.  Subjective:   Patient ID:  Andrea [name] is a 67 y.o. (DOB 1951/05/05) female.  Chief Complaint:  Chief Complaint  Patient presents with  . Follow-up    depression and anxiety    HPI Andrea [name] presents to the office today for follow-up of MDE and anxiety disorder. Good response to medication and no SE.  Exercising is good medicine.  If doesn't then doesn't feel as good. Satisfied.  Patient reports stable mood and denies depressed or irritable moods.  Patient denies any recent difficulty with anxiety.  Patient denies difficulty with sleep initiation or maintenance. Denies appetite disturbance.  Patient reports that energy and motivation have been good.  Patient denies any difficulty with concentration.  Patient denies any suicidal ideation.     Review of Systems:  Review of Systems  Neurological: Negative for tremors and weakness.  Psychiatric/Behavioral: Negative for agitation, behavioral problems, confusion, decreased concentration, dysphoric mood, hallucinations, self-injury, sleep disturbance and suicidal ideas. The patient is not nervous/anxious and is not hyperactive.     Medications: I have reviewed the patient's current medications.  Current Outpatient Medications  Medication Sig Dispense Refill  . atorvastatin (LIPITOR) 20 MG tablet     . calcium citrate-vitamin D (CITRACAL+D) 315-200 MG-UNIT tablet Take by mouth.    . cycloSPORINE (RESTASIS) 0.05 % ophthalmic emulsion Restasis 0.05 % eye drops in a dropperette    . escitalopram (LEXAPRO) 20 MG tablet Take 20 mg by mouth daily.     . fluticasone (FLONASE) 50 MCG/ACT nasal spray fluticasone propionate 50 mcg/actuation nasal spray,suspension    . levothyroxine (SYNTHROID, LEVOTHROID) 50 MCG tablet Take 50 mcg by mouth daily before breakfast.    . raloxifene (EVISTA) 60 MG tablet raloxifene 60 mg tablet    . ALPRAZolam (XANAX) 0.5 MG tablet Take 0.5 mg by mouth at bedtime as  needed for anxiety.    Marland Kitchen amoxicillin-clavulanate (AUGMENTIN) 875-125 MG tablet Take 1 tablet by mouth 2 (two) times daily. (Patient not taking: Reported on 08/08/2018) 10 tablet 0   No current facility-administered medications for this visit.     Medication Side Effects: None  Allergies: No Known Allergies  Past Medical History:  Diagnosis Date  . GAD (generalized anxiety disorder)   . Hyperlipidemia   . Hypothyroidism   . Osteopenia   . Sinusitis     Family History  Problem Relation Age of Onset  . Arthritis Mother   . COPD Mother   . Hypercholesterolemia Mother   . Dementia Mother   . Stroke Mother   . Prostate cancer Brother   . Cancer Maternal Grandmother        Oral    Social History   Socioeconomic History  . Marital status: Married    Spouse name: Not on file  . Number of children: Not on file  . Years of education: Not on file  . Highest education level: Not on file  Occupational History  . Not on file  Social Needs  . Financial resource strain: Not on file  . Food insecurity:    Worry: Not on file    Inability: Not on file  . Transportation needs:    Medical: Not on file    Non-medical: Not on file  Tobacco Use  . Smoking status: Never Smoker  . Smokeless tobacco: Never Used  Substance and Sexual Activity  . Alcohol use: Not on file  . Drug use: Not on file  . Sexual activity: Not on  file  Lifestyle  . Physical activity:    Days per week: Not on file    Minutes per session: Not on file  . Stress: Not on file  Relationships  . Social connections:    Talks on phone: Not on file    Gets together: Not on file    Attends religious service: Not on file    Active member of club or organization: Not on file    Attends meetings of clubs or organizations: Not on file    Relationship status: Not on file  . Intimate partner violence:    Fear of current or ex partner: Not on file    Emotionally abused: Not on file    Physically abused: Not on file     Forced sexual activity: Not on file  Other Topics Concern  . Not on file  Social History Narrative   Married.   1 child. 2 grandchildren.   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   Enjoys exercising, spending time with family    Past Medical History, Surgical history, Social history, and Family history were reviewed and updated as appropriate.   3 grandsons.  Please see review of systems for further details on the patient's review from today.   Objective:   Physical Exam:  There were no vitals taken for this visit.  Physical Exam  Constitutional: She is oriented to person, place, and time. She appears well-developed. No distress.  Musculoskeletal: She exhibits no deformity.  Neurological: She is alert and oriented to person, place, and time. She displays no tremor. Coordination and gait normal.  Psychiatric: She has a normal mood and affect. Her speech is normal and behavior is normal. Judgment and thought content normal. Her mood appears not anxious. Her affect is not angry, not blunt, not labile and not inappropriate. Cognition and memory are normal. She does not exhibit a depressed mood. She expresses no homicidal and no suicidal ideation. She expresses no suicidal plans and no homicidal plans.  Insight intact. No auditory or visual hallucinations. No delusions.     Lab Review:  No results found for: NA, K, CL, CO2, GLUCOSE, BUN, CREATININE, CALCIUM, PROT, ALBUMIN, AST, ALT, ALKPHOS, BILITOT, GFRNONAA, GFRAA  No results found for: WBC, RBC, HGB, HCT, PLT, MCV, MCH, MCHC, RDW, LYMPHSABS, MONOABS, EOSABS, BASOSABS  No results found for: POCLITH, LITHIUM   No results found for: PHENYTOIN, PHENOBARB, VALPROATE, CBMZ   .res Assessment: Plan:    Major depression in complete remission ([place])  Generalized anxiety disorder   Greater than 50% of face to face time with patient was spent on counseling and coordination of care. We discussed her recurrent depression is  well-controlled and anxiety.  No complaints and doesn't want any changes.  Disc poss FU with PCP.  She wants to continue here.  No Bz abuse.  Using very little.  This appointment was 15 minutes  FU 1 year.  Lynder Parents, MD, DFAPA   Please see After Visit Summary for patient specific instructions.  No future appointments.  No orders of the defined types were placed in this encounter.     -------------------------------

## 2018-08-14 ENCOUNTER — Other Ambulatory Visit: Payer: Self-pay | Admitting: Obstetrics and Gynecology

## 2018-08-14 DIAGNOSIS — Z1231 Encounter for screening mammogram for malignant neoplasm of breast: Secondary | ICD-10-CM

## 2018-09-20 DIAGNOSIS — J019 Acute sinusitis, unspecified: Secondary | ICD-10-CM | POA: Diagnosis not present

## 2018-09-20 DIAGNOSIS — R05 Cough: Secondary | ICD-10-CM | POA: Diagnosis not present

## 2018-10-30 ENCOUNTER — Ambulatory Visit: Payer: Medicare Other

## 2018-10-31 ENCOUNTER — Ambulatory Visit: Payer: Medicare Other

## 2018-11-06 DIAGNOSIS — Z131 Encounter for screening for diabetes mellitus: Secondary | ICD-10-CM | POA: Diagnosis not present

## 2018-11-06 DIAGNOSIS — Z1321 Encounter for screening for nutritional disorder: Secondary | ICD-10-CM | POA: Diagnosis not present

## 2018-11-06 DIAGNOSIS — E785 Hyperlipidemia, unspecified: Secondary | ICD-10-CM | POA: Diagnosis not present

## 2018-11-06 DIAGNOSIS — E039 Hypothyroidism, unspecified: Secondary | ICD-10-CM | POA: Diagnosis not present

## 2018-11-06 DIAGNOSIS — Z13228 Encounter for screening for other metabolic disorders: Secondary | ICD-10-CM | POA: Diagnosis not present

## 2018-12-04 ENCOUNTER — Ambulatory Visit
Admission: RE | Admit: 2018-12-04 | Discharge: 2018-12-04 | Disposition: A | Discharge status: Home / Self Care | Payer: Medicare Other | Source: Ambulatory Visit | Attending: Obstetrics and Gynecology | Admitting: Obstetrics and Gynecology

## 2018-12-04 ENCOUNTER — Other Ambulatory Visit: Payer: Self-pay

## 2018-12-04 DIAGNOSIS — Z1231 Encounter for screening mammogram for malignant neoplasm of breast: Secondary | ICD-10-CM

## 2018-12-04 DIAGNOSIS — Z01419 Encounter for gynecological examination (general) (routine) without abnormal findings: Secondary | ICD-10-CM | POA: Diagnosis not present

## 2018-12-04 DIAGNOSIS — Z6825 Body mass index (BMI) 25.0-25.9, adult: Secondary | ICD-10-CM | POA: Diagnosis not present

## 2019-02-02 DIAGNOSIS — F419 Anxiety disorder, unspecified: Secondary | ICD-10-CM | POA: Diagnosis not present

## 2019-02-02 DIAGNOSIS — R911 Solitary pulmonary nodule: Secondary | ICD-10-CM | POA: Diagnosis not present

## 2019-02-02 DIAGNOSIS — A419 Sepsis, unspecified organism: Secondary | ICD-10-CM | POA: Diagnosis not present

## 2019-02-02 DIAGNOSIS — E785 Hyperlipidemia, unspecified: Secondary | ICD-10-CM | POA: Diagnosis not present

## 2019-02-02 DIAGNOSIS — R1031 Right lower quadrant pain: Secondary | ICD-10-CM | POA: Diagnosis not present

## 2019-02-02 DIAGNOSIS — N390 Urinary tract infection, site not specified: Secondary | ICD-10-CM | POA: Diagnosis not present

## 2019-02-02 DIAGNOSIS — J019 Acute sinusitis, unspecified: Secondary | ICD-10-CM | POA: Diagnosis not present

## 2019-02-02 DIAGNOSIS — Z79899 Other long term (current) drug therapy: Secondary | ICD-10-CM | POA: Diagnosis not present

## 2019-02-02 DIAGNOSIS — R509 Fever, unspecified: Secondary | ICD-10-CM | POA: Diagnosis not present

## 2019-02-02 DIAGNOSIS — E039 Hypothyroidism, unspecified: Secondary | ICD-10-CM | POA: Diagnosis not present

## 2019-02-02 DIAGNOSIS — N201 Calculus of ureter: Secondary | ICD-10-CM | POA: Diagnosis not present

## 2019-02-02 DIAGNOSIS — N10 Acute tubulo-interstitial nephritis: Secondary | ICD-10-CM | POA: Diagnosis not present

## 2019-02-02 DIAGNOSIS — N1330 Unspecified hydronephrosis: Secondary | ICD-10-CM | POA: Diagnosis not present

## 2019-02-03 DIAGNOSIS — J019 Acute sinusitis, unspecified: Secondary | ICD-10-CM | POA: Diagnosis present

## 2019-02-03 DIAGNOSIS — B9620 Unspecified Escherichia coli [E. coli] as the cause of diseases classified elsewhere: Secondary | ICD-10-CM | POA: Diagnosis not present

## 2019-02-03 DIAGNOSIS — E039 Hypothyroidism, unspecified: Secondary | ICD-10-CM | POA: Diagnosis not present

## 2019-02-03 DIAGNOSIS — N390 Urinary tract infection, site not specified: Secondary | ICD-10-CM | POA: Diagnosis not present

## 2019-02-03 DIAGNOSIS — R509 Fever, unspecified: Secondary | ICD-10-CM | POA: Diagnosis not present

## 2019-02-03 DIAGNOSIS — K5730 Diverticulosis of large intestine without perforation or abscess without bleeding: Secondary | ICD-10-CM | POA: Diagnosis present

## 2019-02-03 DIAGNOSIS — R911 Solitary pulmonary nodule: Secondary | ICD-10-CM | POA: Diagnosis present

## 2019-02-03 DIAGNOSIS — R5381 Other malaise: Secondary | ICD-10-CM | POA: Diagnosis present

## 2019-02-03 DIAGNOSIS — F419 Anxiety disorder, unspecified: Secondary | ICD-10-CM | POA: Diagnosis present

## 2019-02-03 DIAGNOSIS — R05 Cough: Secondary | ICD-10-CM | POA: Diagnosis not present

## 2019-02-03 DIAGNOSIS — R1031 Right lower quadrant pain: Secondary | ICD-10-CM | POA: Diagnosis not present

## 2019-02-03 DIAGNOSIS — Z79899 Other long term (current) drug therapy: Secondary | ICD-10-CM | POA: Diagnosis not present

## 2019-02-03 DIAGNOSIS — E785 Hyperlipidemia, unspecified: Secondary | ICD-10-CM | POA: Diagnosis not present

## 2019-02-03 DIAGNOSIS — N111 Chronic obstructive pyelonephritis: Secondary | ICD-10-CM | POA: Diagnosis not present

## 2019-02-03 DIAGNOSIS — A419 Sepsis, unspecified organism: Secondary | ICD-10-CM | POA: Diagnosis present

## 2019-02-06 DIAGNOSIS — R509 Fever, unspecified: Secondary | ICD-10-CM | POA: Diagnosis not present

## 2019-02-06 DIAGNOSIS — R05 Cough: Secondary | ICD-10-CM | POA: Diagnosis not present

## 2019-02-19 DIAGNOSIS — E039 Hypothyroidism, unspecified: Secondary | ICD-10-CM | POA: Diagnosis not present

## 2019-03-18 ENCOUNTER — Encounter: Payer: Self-pay | Admitting: Primary Care

## 2019-03-18 ENCOUNTER — Ambulatory Visit (INDEPENDENT_AMBULATORY_CARE_PROVIDER_SITE_OTHER): Payer: Medicare Other | Admitting: Primary Care

## 2019-03-18 DIAGNOSIS — R911 Solitary pulmonary nodule: Secondary | ICD-10-CM

## 2019-03-18 NOTE — Progress Notes (Signed)
Subjective:    Patient ID: Andrea [name], female    DOB: [birth year], 68 y.o.   MRN: [id number]  HPI  Virtual Visit via Video Note  I connected with Andrea [name] on 03/18/19 at  2:20 PM EDT by a video enabled telemedicine application and verified that I am speaking with the correct person using two identifiers.  Location: Patient: Home Provider: Office   I discussed the limitations of evaluation and management by telemedicine and the availability of in person appointments. The patient expressed understanding and agreed to proceed.  History of Present Illness:  Andrea [name] is a 68 year old female who presents today for emergency department follow up.  She was hospitalized in [facility]) May 10th-14th for renal stones, UTI, and severe sinusitis. During her hospital stay she underwent a CT can of her abdomen/pelvis to rule out appendicitis and the scan noted an incidental finding of pulmonary nodule to left lower lobe. She was told during her hospital stay that she would need repeat CT scan in 12 months.   Since her hospital stay she is feeling much better. She has no symptoms of fatigue, fever, urinary symptoms. She also denies hemoptysis, cough, unexplained weight loss. She has never smoked. She does not have her hospital records, nor do I. She is wondering if 12 months is too long to wait for a repeat CT scan.   Observations/Objective:  Alert and oriented. Appears well, not sickly. No distress. Speaking in complete sentences.   Assessment and Plan:  Admitted to [facility] in [place] for four days. Since then has recovered well. Pulmonary nodule as an incidental finding per patient. We will have her sign a release form so we can obtain records. Discussed that typically 12 months is recommended when monitoring pulmonary nodules due to slow growth. I will obtain CT scan records and review.  For now we will plan on repeat CT chest in May 2021. No alarm  signs during exam and HPI.  Follow Up Instructions:  Please return to our office to sign a release form for hospital records.  I will be in touch regarding your repeat CT scan once I've reviewed.  It was a pleasure to see you today!    I discussed the assessment and treatment plan with the patient. The patient was provided an opportunity to ask questions and all were answered. The patient agreed with the plan and demonstrated an understanding of the instructions.   The patient was advised to call back or seek an in-person evaluation if the symptoms worsen or if the condition fails to improve as anticipated.     Andrea Koch, NP    Review of Systems  Constitutional: Negative for unexpected weight change.  HENT: Negative for congestion.   Respiratory: Negative for cough and shortness of breath.   Genitourinary: Negative for dysuria, frequency and hematuria.  Neurological: Negative for dizziness and weakness.       Past Medical History:  Diagnosis Date  . GAD (generalized anxiety disorder)   . Hyperlipidemia   . Hypothyroidism   . Osteopenia   . Sinusitis      Social History   Socioeconomic History  . Marital status: Married    Spouse name: Not on file  . Number of children: Not on file  . Years of education: Not on file  . Highest education level: Not on file  Occupational History  . Not on file  Social Needs  . Financial resource [name]: Not on  file  . Food insecurity    Worry: Not on file    Inability: Not on file  . Transportation needs    Medical: Not on file    Non-medical: Not on file  Tobacco Use  . Smoking status: Never Smoker  . Smokeless tobacco: Never Used  Substance and Sexual Activity  . Alcohol use: Not on file  . Drug use: Not on file  . Sexual activity: Not on file  Lifestyle  . Physical activity    Days per week: Not on file    Minutes per session: Not on file  . Stress: Not on file  Relationships  . Social Product manager on phone: Not on file    Gets together: Not on file    Attends religious service: Not on file    Active member of club or organization: Not on file    Attends meetings of clubs or organizations: Not on file    Relationship status: Not on file  . Intimate partner violence    Fear of current or ex partner: Not on file    Emotionally abused: Not on file    Physically abused: Not on file    Forced sexual activity: Not on file  Other Topics Concern  . Not on file  Social History Narrative   Married.   1 child. 2 grandchildren.   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   Enjoys exercising, spending time with family    Past Surgical History:  Procedure Laterality Date  . AUGMENTATION MAMMAPLASTY    . HUMERUS FRACTURE SURGERY  2013    Family History  Problem Relation Age of Onset  . Arthritis Mother   . COPD Mother   . Hypercholesterolemia Mother   . Dementia Mother   . Stroke Mother   . Prostate cancer Brother   . Cancer Maternal Grandmother        Oral    No Known Allergies  Current Outpatient Medications on File Prior to Visit  Medication Sig Dispense Refill  . ALPRAZolam (XANAX) 0.5 MG tablet Take 1 tablet (0.5 mg total) by mouth at bedtime as needed for anxiety. 30 tablet 5  . amoxicillin-clavulanate (AUGMENTIN) 875-125 MG tablet Take 1 tablet by mouth 2 (two) times daily. (Patient not taking: Reported on 08/08/2018) 10 tablet 0  . atorvastatin (LIPITOR) 20 MG tablet     . calcium citrate-vitamin D (CITRACAL+D) 315-200 MG-UNIT tablet Take by mouth.    . cycloSPORINE (RESTASIS) 0.05 % ophthalmic emulsion Restasis 0.05 % eye drops in a dropperette    . escitalopram (LEXAPRO) 20 MG tablet Take 1 tablet (20 mg total) by mouth daily. 90 tablet 3  . fluticasone (FLONASE) 50 MCG/ACT nasal spray fluticasone propionate 50 mcg/actuation nasal spray,suspension    . levothyroxine (SYNTHROID, LEVOTHROID) 50 MCG tablet Take 50 mcg by mouth daily before breakfast.    .  raloxifene (EVISTA) 60 MG tablet raloxifene 60 mg tablet     No current facility-administered medications on file prior to visit.     There were no vitals taken for this visit.   Objective:   Physical Exam  Constitutional: She is oriented to person, place, and time. She appears well-nourished. She does not have a sickly appearance. She does not appear ill.  Respiratory: Effort normal.  Neurological: She is alert and oriented to person, place, and time.  Psychiatric: She has a normal mood and affect.  Assessment & Plan:

## 2019-03-18 NOTE — Patient Instructions (Signed)
Please return to our office to sign a release form for hospital records.  I will be in touch regarding your repeat CT scan once I've reviewed.  It was a pleasure to see you today!

## 2019-03-18 NOTE — Assessment & Plan Note (Signed)
  Admitted to [facility] in [place] for four days. Since then has recovered well. Pulmonary nodule as an incidental finding per patient. We will have her sign a release form so we can obtain records. Discussed that typically 12 months is recommended when monitoring pulmonary nodules due to slow growth. I will obtain CT scan records and review.  For now we will plan on repeat CT chest in May 2021. No alarm signs during exam and HPI.

## 2019-03-21 ENCOUNTER — Telehealth: Payer: Self-pay | Admitting: Primary Care

## 2019-03-21 NOTE — Telephone Encounter (Signed)
Please notify patient that I reviewed her CT scan from her hospital stay in May and saw the lung nodule. We will repeat the CT of her chest in May 2021.

## 2019-03-24 NOTE — Telephone Encounter (Signed)
Spoken and notified patient of Kate Clark's comments. Patient verbalized understanding.  

## 2019-04-23 ENCOUNTER — Other Ambulatory Visit: Payer: Self-pay

## 2019-04-28 ENCOUNTER — Other Ambulatory Visit: Payer: Self-pay | Admitting: Psychiatry

## 2019-04-28 DIAGNOSIS — B078 Other viral warts: Secondary | ICD-10-CM | POA: Diagnosis not present

## 2019-04-28 DIAGNOSIS — Z09 Encounter for follow-up examination after completed treatment for conditions other than malignant neoplasm: Secondary | ICD-10-CM | POA: Diagnosis not present

## 2019-04-28 DIAGNOSIS — D485 Neoplasm of uncertain behavior of skin: Secondary | ICD-10-CM | POA: Diagnosis not present

## 2019-04-28 DIAGNOSIS — Z08 Encounter for follow-up examination after completed treatment for malignant neoplasm: Secondary | ICD-10-CM | POA: Diagnosis not present

## 2019-04-28 DIAGNOSIS — L538 Other specified erythematous conditions: Secondary | ICD-10-CM | POA: Diagnosis not present

## 2019-04-28 DIAGNOSIS — R238 Other skin changes: Secondary | ICD-10-CM | POA: Diagnosis not present

## 2019-04-28 DIAGNOSIS — Z872 Personal history of diseases of the skin and subcutaneous tissue: Secondary | ICD-10-CM | POA: Diagnosis not present

## 2019-04-28 DIAGNOSIS — Z85828 Personal history of other malignant neoplasm of skin: Secondary | ICD-10-CM | POA: Diagnosis not present

## 2019-04-28 DIAGNOSIS — D2262 Melanocytic nevi of left upper limb, including shoulder: Secondary | ICD-10-CM | POA: Diagnosis not present

## 2019-05-29 DIAGNOSIS — Z20828 Contact with and (suspected) exposure to other viral communicable diseases: Secondary | ICD-10-CM | POA: Diagnosis not present

## 2019-06-24 ENCOUNTER — Encounter: Payer: Self-pay | Admitting: Family Medicine

## 2019-06-24 ENCOUNTER — Ambulatory Visit (INDEPENDENT_AMBULATORY_CARE_PROVIDER_SITE_OTHER): Payer: Medicare Other | Admitting: Family Medicine

## 2019-06-24 ENCOUNTER — Other Ambulatory Visit: Payer: Self-pay

## 2019-06-24 VITALS — BP 120/72 | HR 77 | Temp 97.6°F | Ht 62.25 in | Wt 135.5 lb

## 2019-06-24 DIAGNOSIS — T2101XS Burn of unspecified degree of chest wall, sequela: Secondary | ICD-10-CM | POA: Diagnosis not present

## 2019-06-24 DIAGNOSIS — S21012A Laceration without foreign body of left breast, initial encounter: Secondary | ICD-10-CM

## 2019-06-24 DIAGNOSIS — Z23 Encounter for immunization: Secondary | ICD-10-CM

## 2019-06-24 HISTORY — DX: Burn of unspecified degree of chest wall, sequela: T21.01XS

## 2019-06-24 HISTORY — DX: Laceration without foreign body of left breast, initial encounter: S21.012A

## 2019-06-24 NOTE — Assessment & Plan Note (Signed)
L breast-2 already debrided blisters with open area in the middle and no s/s of infection  Continue soap/water cleanse Protect from clothing with clean gauze Continue neosporin bid Td shot today  Disc s/s of skin infection to watch for -inc size/redness/pain or drainage
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
## 2019-07-04 DIAGNOSIS — Z20828 Contact with and (suspected) exposure to other viral communicable diseases: Secondary | ICD-10-CM | POA: Diagnosis not present

## 2019-07-17 ENCOUNTER — Ambulatory Visit (INDEPENDENT_AMBULATORY_CARE_PROVIDER_SITE_OTHER): Payer: Medicare Other | Admitting: Family Medicine

## 2019-07-17 ENCOUNTER — Encounter: Payer: Self-pay | Admitting: Family Medicine

## 2019-07-17 ENCOUNTER — Other Ambulatory Visit: Payer: Self-pay

## 2019-07-17 VITALS — BP 116/66 | HR 81 | Temp 98.0°F | Ht 62.25 in | Wt 135.0 lb

## 2019-07-17 DIAGNOSIS — R350 Frequency of micturition: Secondary | ICD-10-CM | POA: Diagnosis not present

## 2019-07-17 LAB — POC URINALSYSI DIPSTICK (AUTOMATED)
Bilirubin, UA: NEGATIVE
Glucose, UA: POSITIVE — AB
Ketones, UA: NEGATIVE
Nitrite, UA: NEGATIVE
Protein, UA: NEGATIVE
Spec Grav, UA: 1.01 (ref 1.010–1.025)
Urobilinogen, UA: 0.2 E.U./dL
pH, UA: 6 (ref 5.0–8.0)

## 2019-07-17 MED ORDER — CEPHALEXIN 500 MG PO CAPS: 500.0000 mg | ORAL_CAPSULE | Freq: Two times a day (BID) | ORAL | 0 refills | Status: DC

## 2019-07-17 NOTE — Patient Instructions (Signed)
Drink plenty of water and start the antibiotics today.  We'll contact you with your lab report.  Take care.   

## 2019-07-17 NOTE — Progress Notes (Signed)
Dysuria: yes, urinary pressure and frequency.   duration of symptoms: since yesterday.  abdominal pain: some lower pressure Fevers: no back pain:no vomiting:no  Meds, vitals, and allergies reviewed.   Per HPI unless specifically indicated in ROS section   GEN: nad, alert and oriented HEENT: ncat NECK: supple CV: rrr.  PULM: ctab, no inc wob ABD: soft, +bs, suprapubic area not tender EXT: no edema SKIN: well perfused.  BACK: no CVA pain

## 2019-07-19 LAB — URINE CULTURE
MICRO NUMBER:: 1018933
SPECIMEN QUALITY:: ADEQUATE

## 2019-07-20 DIAGNOSIS — R350 Frequency of micturition: Secondary | ICD-10-CM | POA: Insufficient documentation

## 2019-07-20 NOTE — Assessment & Plan Note (Signed)
Presumed cystitis.  Nontoxic.  Still okay for outpatient follow-up.  Discussed options.  Check urine culture.  Urinalysis discussed with patient.  Start Keflex today.  Drink plenty fluids.  Update me as needed.  She agrees.

## 2019-08-09 ENCOUNTER — Other Ambulatory Visit: Payer: Self-pay | Admitting: Psychiatry

## 2019-08-11 DIAGNOSIS — Z1159 Encounter for screening for other viral diseases: Secondary | ICD-10-CM | POA: Diagnosis not present

## 2019-08-20 DIAGNOSIS — Z03818 Encounter for observation for suspected exposure to other biological agents ruled out: Secondary | ICD-10-CM | POA: Diagnosis not present

## 2019-09-09 ENCOUNTER — Telehealth: Payer: Self-pay | Admitting: Primary Care

## 2019-09-09 NOTE — Telephone Encounter (Addendum)
Please notify patient that I can enter these labs electronically and release to [facility] closer to her appointment. If so then have her call us 1 week prior to her appointment date with me. Will this work for her?

## 2019-09-09 NOTE — Telephone Encounter (Signed)
Pt called [facility]  and wanted to know if kate could do a pap for her.  She stated she is on medicare and wanted to know if she still needs complete physical with pap

## 2019-09-09 NOTE — Telephone Encounter (Signed)
Spoken and notified patient of Kate Clark's comments. Patient verbalized understanding.  

## 2019-09-09 NOTE — Telephone Encounter (Signed)
Pt called back scheduled her medicare wellness 3/17 and cpx medicare with kate 3/24.  She would like an order for her labs mailed to her so she can go to [facility] prior to her 3/24 appointment

## 2019-09-09 NOTE — Telephone Encounter (Signed)
Pap smears are discontinued after the age of 11 unless her last pap was abnormal.  I'm happy to order her annual mammograms and her bone density tests when due.

## 2019-09-10 ENCOUNTER — Other Ambulatory Visit: Payer: Self-pay | Admitting: Psychiatry

## 2019-09-10 NOTE — Telephone Encounter (Signed)
Spoken and notified patient of [name] comments. Patient stated that she would rather get the printed lab orders. She will call a couple weeks before her appointment and pick up the orders

## 2019-10-03 ENCOUNTER — Other Ambulatory Visit: Payer: Self-pay | Admitting: Psychiatry

## 2019-10-14 ENCOUNTER — Other Ambulatory Visit: Payer: Self-pay

## 2019-10-14 ENCOUNTER — Encounter: Payer: Self-pay | Admitting: Psychiatry

## 2019-10-14 ENCOUNTER — Ambulatory Visit (INDEPENDENT_AMBULATORY_CARE_PROVIDER_SITE_OTHER): Payer: Medicare Other | Admitting: Psychiatry

## 2019-10-14 DIAGNOSIS — F325 Major depressive disorder, single episode, in full remission: Secondary | ICD-10-CM

## 2019-10-14 DIAGNOSIS — F411 Generalized anxiety disorder: Secondary | ICD-10-CM | POA: Diagnosis not present

## 2019-10-14 MED ORDER — ESCITALOPRAM OXALATE 20 MG PO TABS: 20.0000 mg | ORAL_TABLET | Freq: Every day | ORAL | 3 refills | Status: DC

## 2019-10-14 MED ORDER — ALPRAZOLAM 0.5 MG PO TABS: 0.5000 mg | ORAL_TABLET | Freq: Every evening | ORAL | 0 refills | Status: DC | PRN

## 2019-10-14 NOTE — Progress Notes (Signed)
Andrea [name] [id number] 1951/02/12 69 y.o.  Subjective:   Patient ID:  Andrea [name] is a 69 y.o. (DOB 1951-04-21) female.  Chief Complaint:  Chief Complaint  Patient presents with  . Follow-up    Medication Management  . Depression    Medication Management  . Anxiety    Medication Management    HPI [name] presents to the office today for follow-up of MDE and anxiety disorder.  Last seen August 13, 2018.  No meds were changed.  Been on Lexapro 20 since early 2017.  Wonderful overall.  Good with [employer] diet.    Rare alprazolam for HS.  Stress moving to condo and would have crying spell over the move and some problems she was running into dealing with it.  Hard with change.  But taking time to adjust to the idea of moving from her house of 23 years.  Initially refused but he left it up to her and she's come to peace about it.  [name]    Good response to medication and no SE.  Exercising is good medicine.  If doesn't then doesn't feel as good. Satisfied.  Patient reports stable mood and denies depressed or irritable moods.  Patient denies any recent difficulty with anxiety.  Patient denies difficulty with sleep initiation or maintenance. Denies appetite disturbance.  Patient reports that energy and motivation have been good. Patient denies any difficulty with concentration.  Patient denies any suicidal ideation.   [name] with a little dementia.  Past Psychiatric Medication Trials: Sertraline, fluoxetine, citalopram 50, nefazodone, mirtazapine, Lexapro 20, Viibryd 30, Wellbutrin 300, methyl folate  buspirone, alprazolam, clonazepam, Under the care of [facility] since January 2001  Review of Systems:  Review of Systems  Neurological: Negative for tremors and weakness.  Psychiatric/Behavioral: Negative for agitation, behavioral problems, confusion, decreased concentration, dysphoric mood, hallucinations, self-injury, sleep disturbance and suicidal ideas.  The patient is not nervous/anxious and is not hyperactive.     Medications: I have reviewed the patient's current medications.  Current Outpatient Medications  Medication Sig Dispense Refill  . ALPRAZolam (XANAX) 0.5 MG tablet Take 1 tablet (0.5 mg total) by mouth at bedtime as needed for anxiety. 90 tablet 0  . atorvastatin (LIPITOR) 20 MG tablet Take 20 mg by mouth.     . calcium citrate-vitamin D (CITRACAL+D) 315-200 MG-UNIT tablet Take by mouth.    . cycloSPORINE (RESTASIS) 0.05 % ophthalmic emulsion Restasis 0.05 % eye drops in a dropperette    . escitalopram (LEXAPRO) 20 MG tablet Take 1 tablet (20 mg total) by mouth daily. 90 tablet 3  . fluticasone (FLONASE) 50 MCG/ACT nasal spray fluticasone propionate 50 mcg/actuation nasal spray,suspension    . levothyroxine (SYNTHROID, LEVOTHROID) 50 MCG tablet Take 50 mcg by mouth daily before breakfast.    . raloxifene (EVISTA) 60 MG tablet raloxifene 60 mg tablet     No current facility-administered medications for this visit.    Medication Side Effects: None  Allergies: No Known Allergies  Past Medical History:  Diagnosis Date  . GAD (generalized anxiety disorder)   . Hyperlipidemia   . Hypothyroidism   . Osteopenia   . Sinusitis     Family History  Problem Relation Age of Onset  . Arthritis Mother   . COPD Mother   . Hypercholesterolemia Mother   . Dementia Mother   . Stroke Mother   . Prostate cancer Brother   . Cancer Maternal Grandmother        Oral  Social History   Socioeconomic History  . Marital status: Married    Spouse name: Not on file  . Number of children: Not on file  . Years of education: Not on file  . Highest education level: Not on file  Occupational History  . Not on file  Tobacco Use  . Smoking status: Never Smoker  . Smokeless tobacco: Never Used  Substance and Sexual Activity  . Alcohol use: Not on file  . Drug use: Not on file  . Sexual activity: Not on file  Other Topics Concern   . Not on file  Social History Narrative   Married.   1 child. 2 grandchildren.   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   Enjoys exercising, spending time with family   Social Determinants of Health   Financial Resource Strain:   . Difficulty of Paying Living Expenses: Not on file  Food Insecurity:   . Worried About Charity fundraiser in the Last Year: Not on file  . Ran Out of Food in the Last Year: Not on file  Transportation Needs:   . Lack of Transportation (Medical): Not on file  . Lack of Transportation (Non-Medical): Not on file  Physical Activity:   . Days of Exercise per Week: Not on file  . Minutes of Exercise per Session: Not on file  Stress:   . Feeling of Stress : Not on file  Social Connections:   . Frequency of Communication with Friends and Family: Not on file  . Frequency of Social Gatherings with Friends and Family: Not on file  . Attends Religious Services: Not on file  . Active Member of Clubs or Organizations: Not on file  . Attends Archivist Meetings: Not on file  . Marital Status: Not on file  Intimate Partner Violence:   . Fear of Current or Ex-Partner: Not on file  . Emotionally Abused: Not on file  . Physically Abused: Not on file  . Sexually Abused: Not on file    Past Medical History, Surgical history, Social history, and Family history were reviewed and updated as appropriate.   3 grandsons.  Please see review of systems for further details on the patient's review from today.   Objective:   Physical Exam:  There were no vitals taken for this visit.  Physical Exam Constitutional:      General: She is not in acute distress.    Appearance: She is well-developed.  Musculoskeletal:        General: No deformity.  Neurological:     Mental Status: She is alert and oriented to person, place, and time.     Motor: No tremor.     Coordination: Coordination normal.     Gait: Gait normal.  Psychiatric:        Attention and  Perception: She is attentive. She does not perceive auditory hallucinations.        Mood and Affect: Mood is not anxious or depressed. Affect is not labile, blunt, angry or inappropriate.        Speech: Speech normal.        Behavior: Behavior normal.        Thought Content: Thought content normal. Thought content does not include homicidal or suicidal ideation. Thought content does not include homicidal or suicidal plan.        Cognition and Memory: Cognition and memory normal.        Judgment: Judgment normal.     Comments: Insight intact.  No auditory or visual hallucinations. No delusions.      Lab Review:  No results found for: NA, K, CL, CO2, GLUCOSE, BUN, CREATININE, CALCIUM, PROT, ALBUMIN, AST, ALT, ALKPHOS, BILITOT, GFRNONAA, GFRAA  No results found for: WBC, RBC, HGB, HCT, PLT, MCV, MCH, MCHC, RDW, LYMPHSABS, MONOABS, EOSABS, BASOSABS  No results found for: POCLITH, LITHIUM   No results found for: PHENYTOIN, PHENOBARB, VALPROATE, CBMZ   .res Assessment: Plan:    Major depression in complete remission ([place]) - Plan: escitalopram (LEXAPRO) 20 MG tablet  Generalized anxiety disorder - Plan: escitalopram (LEXAPRO) 20 MG tablet, ALPRAZolam (XANAX) 0.5 MG tablet   Greater than 50% of face to face time with patient was spent on counseling and coordination of care. We discussed her recurrent depression is well-controlled and anxiety.  No complaints and doesn't want any changes.  Disc poss FU with PCP.  She wants to continue here.  No Bz abuse.  Using very little.  Continue Lexapro 20  This appointment was 15 minutes  FU 1 year.  Lynder Parents, MD, DFAPA   Please see After Visit Summary for patient specific instructions.  Future Appointments  Date Time Provider [place]  12/10/2019  2:45 PM [facility] NURSE HEALTH ADVISOR [facility]  12/16/2019  2:40 PM Pleas Koch, NP [facility]    No orders of the defined types were placed in this encounter.      -------------------------------

## 2019-10-15 ENCOUNTER — Other Ambulatory Visit: Payer: Self-pay | Admitting: Obstetrics and Gynecology

## 2019-10-15 DIAGNOSIS — Z1231 Encounter for screening mammogram for malignant neoplasm of breast: Secondary | ICD-10-CM

## 2019-10-27 DIAGNOSIS — Z23 Encounter for immunization: Secondary | ICD-10-CM | POA: Diagnosis not present

## 2019-10-31 DIAGNOSIS — Z20822 Contact with and (suspected) exposure to covid-19: Secondary | ICD-10-CM | POA: Diagnosis not present

## 2019-11-07 ENCOUNTER — Ambulatory Visit: Payer: Medicare Other

## 2019-11-18 DIAGNOSIS — Z23 Encounter for immunization: Secondary | ICD-10-CM | POA: Diagnosis not present

## 2019-11-28 DIAGNOSIS — Z20828 Contact with and (suspected) exposure to other viral communicable diseases: Secondary | ICD-10-CM | POA: Diagnosis not present

## 2019-11-28 DIAGNOSIS — Z03818 Encounter for observation for suspected exposure to other biological agents ruled out: Secondary | ICD-10-CM | POA: Diagnosis not present

## 2019-12-10 ENCOUNTER — Ambulatory Visit: Payer: Medicare Other

## 2019-12-12 DIAGNOSIS — E785 Hyperlipidemia, unspecified: Secondary | ICD-10-CM | POA: Diagnosis not present

## 2019-12-12 DIAGNOSIS — Z131 Encounter for screening for diabetes mellitus: Secondary | ICD-10-CM | POA: Diagnosis not present

## 2019-12-12 DIAGNOSIS — Z13228 Encounter for screening for other metabolic disorders: Secondary | ICD-10-CM | POA: Diagnosis not present

## 2019-12-12 DIAGNOSIS — Z1321 Encounter for screening for nutritional disorder: Secondary | ICD-10-CM | POA: Diagnosis not present

## 2019-12-12 DIAGNOSIS — E039 Hypothyroidism, unspecified: Secondary | ICD-10-CM | POA: Diagnosis not present

## 2019-12-16 ENCOUNTER — Ambulatory Visit
Admission: RE | Admit: 2019-12-16 | Discharge: 2019-12-16 | Disposition: A | Discharge status: Home / Self Care | Payer: Medicare Other | Source: Ambulatory Visit | Attending: Obstetrics and Gynecology | Admitting: Obstetrics and Gynecology

## 2019-12-16 ENCOUNTER — Other Ambulatory Visit: Payer: Self-pay

## 2019-12-16 ENCOUNTER — Ambulatory Visit: Payer: Medicare Other | Admitting: Primary Care

## 2019-12-16 DIAGNOSIS — Z1231 Encounter for screening mammogram for malignant neoplasm of breast: Secondary | ICD-10-CM

## 2019-12-16 DIAGNOSIS — M955 Acquired deformity of pelvis: Secondary | ICD-10-CM | POA: Diagnosis not present

## 2019-12-16 DIAGNOSIS — Z6823 Body mass index (BMI) 23.0-23.9, adult: Secondary | ICD-10-CM | POA: Diagnosis not present

## 2019-12-16 DIAGNOSIS — Z124 Encounter for screening for malignant neoplasm of cervix: Secondary | ICD-10-CM | POA: Diagnosis not present

## 2019-12-16 DIAGNOSIS — M62830 Muscle spasm of back: Secondary | ICD-10-CM | POA: Diagnosis not present

## 2019-12-16 DIAGNOSIS — N958 Other specified menopausal and perimenopausal disorders: Secondary | ICD-10-CM | POA: Diagnosis not present

## 2019-12-16 DIAGNOSIS — M9903 Segmental and somatic dysfunction of lumbar region: Secondary | ICD-10-CM | POA: Diagnosis not present

## 2019-12-16 DIAGNOSIS — M9905 Segmental and somatic dysfunction of pelvic region: Secondary | ICD-10-CM | POA: Diagnosis not present

## 2019-12-16 DIAGNOSIS — M545 Low back pain: Secondary | ICD-10-CM | POA: Diagnosis not present

## 2019-12-16 DIAGNOSIS — M8588 Other specified disorders of bone density and structure, other site: Secondary | ICD-10-CM | POA: Diagnosis not present

## 2019-12-17 DIAGNOSIS — M62830 Muscle spasm of back: Secondary | ICD-10-CM | POA: Diagnosis not present

## 2019-12-17 DIAGNOSIS — M9903 Segmental and somatic dysfunction of lumbar region: Secondary | ICD-10-CM | POA: Diagnosis not present

## 2019-12-17 DIAGNOSIS — M9905 Segmental and somatic dysfunction of pelvic region: Secondary | ICD-10-CM | POA: Diagnosis not present

## 2019-12-17 DIAGNOSIS — M955 Acquired deformity of pelvis: Secondary | ICD-10-CM | POA: Diagnosis not present

## 2019-12-18 DIAGNOSIS — M9903 Segmental and somatic dysfunction of lumbar region: Secondary | ICD-10-CM | POA: Diagnosis not present

## 2019-12-18 DIAGNOSIS — M955 Acquired deformity of pelvis: Secondary | ICD-10-CM | POA: Diagnosis not present

## 2019-12-18 DIAGNOSIS — M9905 Segmental and somatic dysfunction of pelvic region: Secondary | ICD-10-CM | POA: Diagnosis not present

## 2019-12-18 DIAGNOSIS — M62830 Muscle spasm of back: Secondary | ICD-10-CM | POA: Diagnosis not present

## 2019-12-19 DIAGNOSIS — M62830 Muscle spasm of back: Secondary | ICD-10-CM | POA: Diagnosis not present

## 2019-12-19 DIAGNOSIS — M955 Acquired deformity of pelvis: Secondary | ICD-10-CM | POA: Diagnosis not present

## 2019-12-19 DIAGNOSIS — Z03818 Encounter for observation for suspected exposure to other biological agents ruled out: Secondary | ICD-10-CM | POA: Diagnosis not present

## 2019-12-19 DIAGNOSIS — Z20828 Contact with and (suspected) exposure to other viral communicable diseases: Secondary | ICD-10-CM | POA: Diagnosis not present

## 2019-12-19 DIAGNOSIS — M9905 Segmental and somatic dysfunction of pelvic region: Secondary | ICD-10-CM | POA: Diagnosis not present

## 2019-12-19 DIAGNOSIS — M9903 Segmental and somatic dysfunction of lumbar region: Secondary | ICD-10-CM | POA: Diagnosis not present

## 2019-12-31 ENCOUNTER — Encounter: Payer: Self-pay | Admitting: Primary Care

## 2019-12-31 ENCOUNTER — Ambulatory Visit (INDEPENDENT_AMBULATORY_CARE_PROVIDER_SITE_OTHER): Payer: Medicare Other | Admitting: Primary Care

## 2019-12-31 VITALS — BP 122/82 | HR 68 | Temp 96.8°F

## 2019-12-31 DIAGNOSIS — E785 Hyperlipidemia, unspecified: Secondary | ICD-10-CM

## 2019-12-31 DIAGNOSIS — M8580 Other specified disorders of bone density and structure, unspecified site: Secondary | ICD-10-CM | POA: Diagnosis not present

## 2019-12-31 DIAGNOSIS — F411 Generalized anxiety disorder: Secondary | ICD-10-CM | POA: Diagnosis not present

## 2019-12-31 DIAGNOSIS — E039 Hypothyroidism, unspecified: Secondary | ICD-10-CM | POA: Diagnosis not present

## 2019-12-31 DIAGNOSIS — R911 Solitary pulmonary nodule: Secondary | ICD-10-CM

## 2019-12-31 NOTE — Assessment & Plan Note (Signed)
Recent TSH of 2.5 on levothyroxine 50 mcg. Continue same.  Lab report scanned.

## 2019-12-31 NOTE — Progress Notes (Signed)
Subjective:    Patient ID: Andrea [name], female    DOB: 1951/07/17, 69 y.o.   MRN: [id number]  HPI  This visit occurred during the SARS-CoV-2 public health emergency.  Safety protocols were in place, including screening questions prior to the visit, additional usage of staff PPE, and extensive cleaning of exam room while observing appropriate contact time as indicated for disinfecting solutions.   [name] is a 69 year old female with a history of hypothyroidism, hyperlipidemia, GAD, pulmonary nodule who presents today for follow up of lung nodule.   Incidental finding of 4 mm subpleural nodule in the left lower lobe during a hospital visit in May 2020 through [facility] for pyelonephritis.  Recommendation was for repeat CT chest in 12 months.   She denies cough, shortness of breath, unexplained weight loss, family history of lung disease/cancer. She smoked socially for about one year in college.   She mostly follows with her GYN for routine physical and medication refills. She has labs with her today that were drawn in March 2021, TSH stable.   She does follow with psychiatry and feels well managed on Lexapro.   Review of Systems  Constitutional: Negative for appetite change and unexpected weight change.  HENT: Negative for congestion.   Respiratory: Negative for cough and shortness of breath.   Cardiovascular: Negative for chest pain.       Past Medical History:  Diagnosis Date  . GAD (generalized anxiety disorder)   . Hyperlipidemia   . Hypothyroidism   . Osteopenia   . Sinusitis      Social History   Socioeconomic History  . Marital status: Married    Spouse name: Not on file  . Number of children: Not on file  . Years of education: Not on file  . Highest education level: Not on file  Occupational History  . Not on file  Tobacco Use  . Smoking status: Never Smoker  . Smokeless tobacco: Never Used  Substance and Sexual Activity  . Alcohol use: Not on file  . Drug use:  Not on file  . Sexual activity: Not on file  Other Topics Concern  . Not on file  Social History Narrative   Married.   1 child. 2 grandchildren.   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   Enjoys exercising, spending time with family   Social Determinants of Health   Financial Resource Strain:   . Difficulty of Paying Living Expenses:   Food Insecurity:   . Worried About Charity fundraiser in the Last Year:   . Arboriculturist in the Last Year:   Transportation Needs:   . Film/video editor (Medical):   Marland Kitchen Lack of Transportation (Non-Medical):   Physical Activity:   . Days of Exercise per Week:   . Minutes of Exercise per Session:   Stress:   . Feeling of Stress :   Social Connections:   . Frequency of Communication with Friends and Family:   . Frequency of Social Gatherings with Friends and Family:   . Attends Religious Services:   . Active Member of Clubs or Organizations:   . Attends Archivist Meetings:   Marland Kitchen Marital Status:   Intimate Partner Violence:   . Fear of Current or Ex-Partner:   . Emotionally Abused:   Marland Kitchen Physically Abused:   . Sexually Abused:     Past Surgical History:  Procedure Laterality Date  . AUGMENTATION MAMMAPLASTY    . HUMERUS  FRACTURE SURGERY  2013    Family History  Problem Relation Age of Onset  . Arthritis Mother   . COPD Mother   . Hypercholesterolemia Mother   . Dementia Mother   . Stroke Mother   . Prostate cancer Brother   . Cancer Maternal Grandmother        Oral    No Known Allergies  Current Outpatient Medications on File Prior to Visit  Medication Sig Dispense Refill  . atorvastatin (LIPITOR) 20 MG tablet Take 20 mg by mouth.     . calcium citrate-vitamin D (CITRACAL+D) 315-200 MG-UNIT tablet Take by mouth.    . escitalopram (LEXAPRO) 20 MG tablet Take 1 tablet (20 mg total) by mouth daily. 90 tablet 3  . fluticasone (FLONASE) 50 MCG/ACT nasal spray fluticasone propionate 50 mcg/actuation nasal  spray,suspension    . levothyroxine (SYNTHROID, LEVOTHROID) 50 MCG tablet Take 50 mcg by mouth daily before breakfast.    . raloxifene (EVISTA) 60 MG tablet raloxifene 60 mg tablet    . ALPRAZolam (XANAX) 0.5 MG tablet Take 1 tablet (0.5 mg total) by mouth at bedtime as needed for anxiety. (Patient not taking: Reported on 12/31/2019) 90 tablet 0  . XIIDRA 5 % SOLN      No current facility-administered medications on file prior to visit.    BP 122/82   Pulse 68   Temp (!) 96.8 F (36 C) (Temporal)   SpO2 98%    Objective:   Physical Exam  Constitutional: She appears well-nourished.  Cardiovascular: Normal rate and regular rhythm.  Respiratory: Effort normal and breath sounds normal.  Musculoskeletal:     Cervical back: Neck supple.  Skin: Skin is warm and dry.  Psychiatric: She has a normal mood and affect.           Assessment & Plan:

## 2019-12-31 NOTE — Assessment & Plan Note (Signed)
Endorses recent bone density scan has improved. Continue Evista.

## 2019-12-31 NOTE — Assessment & Plan Note (Signed)
Not on medication, recent lipid panel unremarkable. See scanned report.

## 2019-12-31 NOTE — Assessment & Plan Note (Signed)
Doing well on Lexapro, not using Xanax.  Following with psychiatry.

## 2019-12-31 NOTE — Assessment & Plan Note (Signed)
CT chest reviewed from [facility]. Orders placed for lung nodule follow up, CT scan.

## 2019-12-31 NOTE — Patient Instructions (Signed)
You will be contacted regarding your CT scan.  Please let us know if you have not been contacted within two weeks.   It was a pleasure to see you today!

## 2020-01-07 ENCOUNTER — Ambulatory Visit: Payer: Medicare Other

## 2020-01-14 ENCOUNTER — Ambulatory Visit
Admission: RE | Admit: 2020-01-14 | Discharge: 2020-01-14 | Disposition: A | Discharge status: Home / Self Care | Payer: Medicare Other | Source: Ambulatory Visit | Attending: Primary Care | Admitting: Primary Care

## 2020-01-14 ENCOUNTER — Other Ambulatory Visit: Payer: Self-pay

## 2020-01-14 DIAGNOSIS — R918 Other nonspecific abnormal finding of lung field: Secondary | ICD-10-CM | POA: Diagnosis not present

## 2020-01-14 DIAGNOSIS — R911 Solitary pulmonary nodule: Secondary | ICD-10-CM | POA: Insufficient documentation

## 2020-01-19 ENCOUNTER — Telehealth: Payer: Self-pay

## 2020-01-19 NOTE — Telephone Encounter (Signed)
Rosaria Ferries, gave me the number to Radiology at [phone]  Study Notes shows  Dimple Casey, RT on 01/14/2020 12:57 PM  F/U CT Abd/Pelvis without done 02/02/2019 @ [place]. She states she is currently asymptomatic. NKi No hx CA. Hx Breast Implants for elective reasons. Never a smoker.       I was told that they are waiting for the prior CT from [facility] before competing.They did requested again this morning.

## 2020-01-19 NOTE — Telephone Encounter (Signed)
Andrea [name], Please notify patient that I have not yet received the results. We will look into this and get back to her. Andrea [name], Is there a reason? She had this done on 01/14/20.

## 2020-01-19 NOTE — Telephone Encounter (Signed)
Sorry. Yes, I did notified patient. Forget it in the prior notes.

## 2020-01-19 NOTE — Telephone Encounter (Signed)
Pt left v/m that she had a MRI of lung last wk and has not had results yet. Per chart review tab pt had CT chest on 01/14/20. Pt request cb with results. Sending note to Gentry Fitz NP and Vallarie Mare CMA.

## 2020-01-19 NOTE — Telephone Encounter (Signed)
Did we update the patient?

## 2020-02-27 ENCOUNTER — Telehealth: Payer: Self-pay | Admitting: Psychiatry

## 2020-02-27 NOTE — Telephone Encounter (Signed)
Tried to reach patient at 3 pm, no answer and unable to LM.

## 2020-02-27 NOTE — Telephone Encounter (Signed)
Patient called back and she's been having temporary stress, but it's not bad. Things are great, she's working out and feeling okay for the most part. It's been going on for a bit so she agrees to the increase Lexapro 20 mg 1.5 tablets daily. She has been taking alprazolam at hs occasionally to help sleep better and it does help.   Advised to call back if symptoms don't improved or worsen.

## 2020-02-27 NOTE — Telephone Encounter (Signed)
If the anxiety is just occasional then using alprazolam as needed would be an okay thing to do.  If it is been fairly persistent for a couple of weeks or more than the simplest solution would be to increase the Lexapro to 1-1/2 tablets daily.  Historically she has done well on Lexapro 20 mg daily for an extended period of time.  If she is under temporary stress then once that is resolved we could drop it back to 1 daily.  If this does not work as expected then she should schedule an earlier appointment.

## 2020-02-27 NOTE — Telephone Encounter (Signed)
Pt experiencing more anxiety than she normally has. Pt would like to know what are some options that she has as far as her medication. Pt will be in an appt today from 10-12. Please call later.

## 2020-03-25 ENCOUNTER — Telehealth: Payer: Self-pay | Admitting: Psychiatry

## 2020-03-25 ENCOUNTER — Other Ambulatory Visit: Payer: Self-pay

## 2020-03-25 DIAGNOSIS — F411 Generalized anxiety disorder: Secondary | ICD-10-CM

## 2020-03-25 DIAGNOSIS — F325 Major depressive disorder, single episode, in full remission: Secondary | ICD-10-CM

## 2020-03-25 MED ORDER — ESCITALOPRAM OXALATE 20 MG PO TABS: ORAL_TABLET | ORAL | 3 refills | Status: DC

## 2020-03-25 MED ORDER — ESCITALOPRAM OXALATE 10 MG PO TABS: ORAL_TABLET | ORAL | 3 refills | Status: DC

## 2020-03-25 NOTE — Telephone Encounter (Signed)
2 Rx's sent for Lexapro 20 mg and Lexapro 10 mg, 90 day supply with refills

## 2020-03-25 NOTE — Telephone Encounter (Signed)
Andrea [name] called to get refill of her Lexapro.  You increased her dose to 30mg  so she will run out early and the pharmacy won't fill it until 7/18 based on the present prescription.  If there isn't a 30mg  pill, can she have a 20mg  and a 10mg  so she doesn't have to split the pills.  [facility] [address] In [place]

## 2020-03-30 DIAGNOSIS — L570 Actinic keratosis: Secondary | ICD-10-CM | POA: Diagnosis not present

## 2020-04-07 IMAGING — MG DIGITAL SCREENING BILATERAL MAMMOGRAM WITH IMPLANTS, CAD AND TOM
8 of 12 series · 8 of 28 positions shown · non-contrast
Comparison: Previous exam(s).

CLINICAL DATA: Screening.

EXAM:
DIGITAL SCREENING BILATERAL MAMMOGRAM WITH IMPLANTS, CAD AND TOMO
The patient has retropectoral implants. Standard and implant
displaced views were performed.

[R MLO]
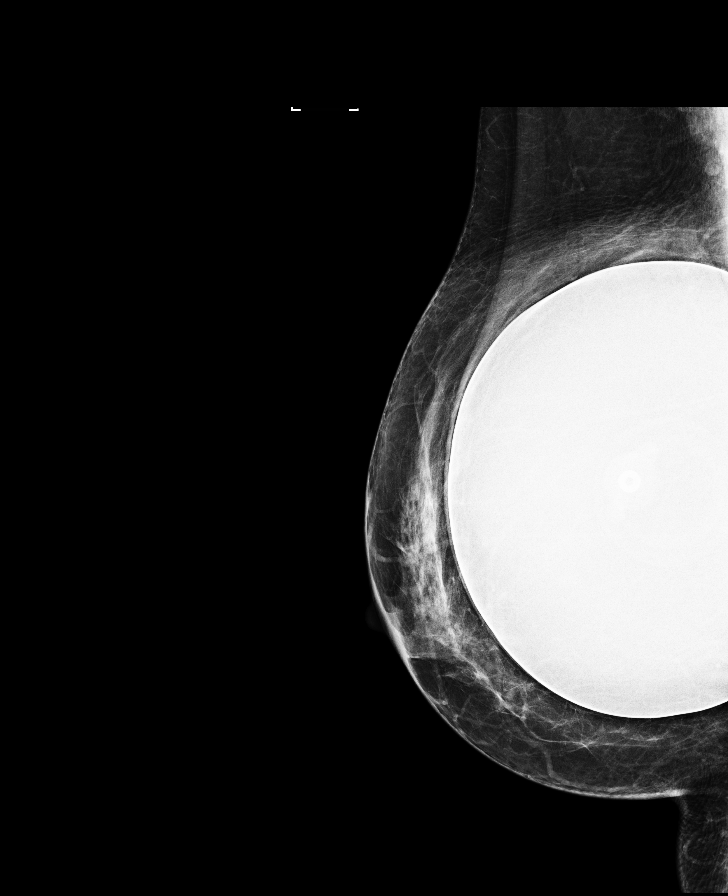

[L CC]
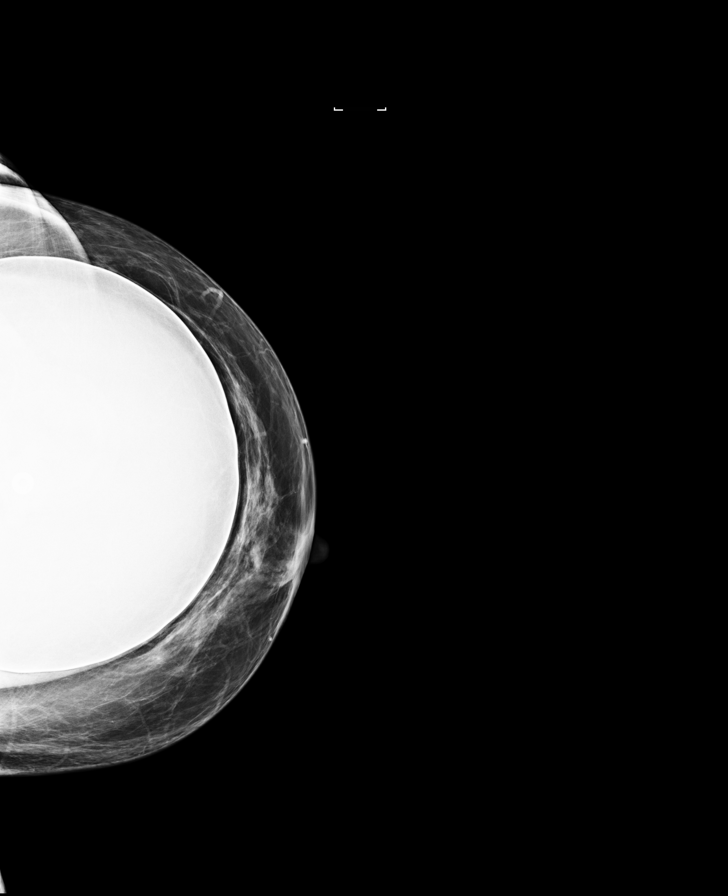

[L MLO]
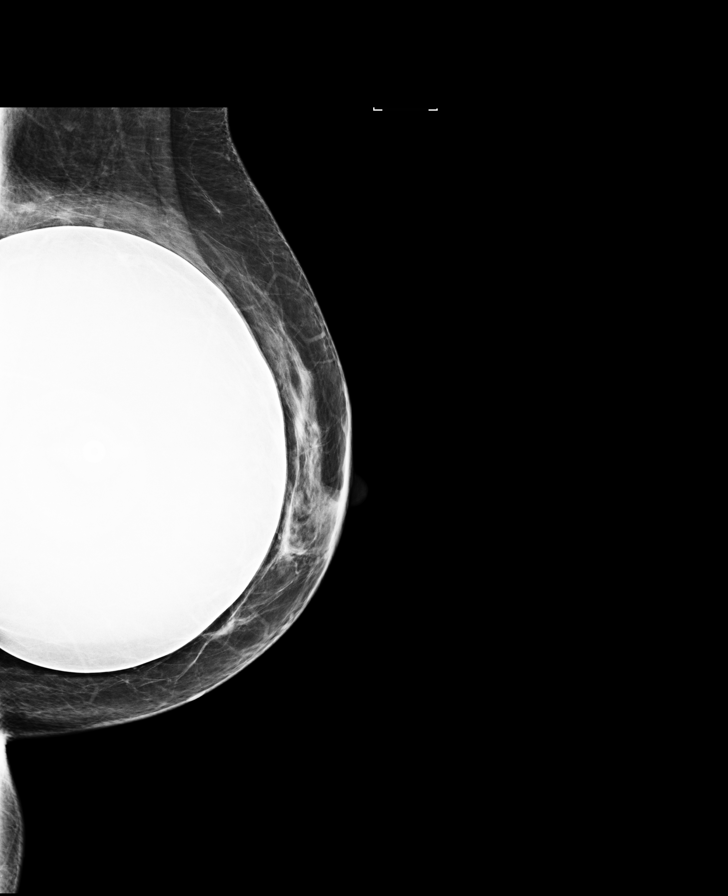

[R CC]
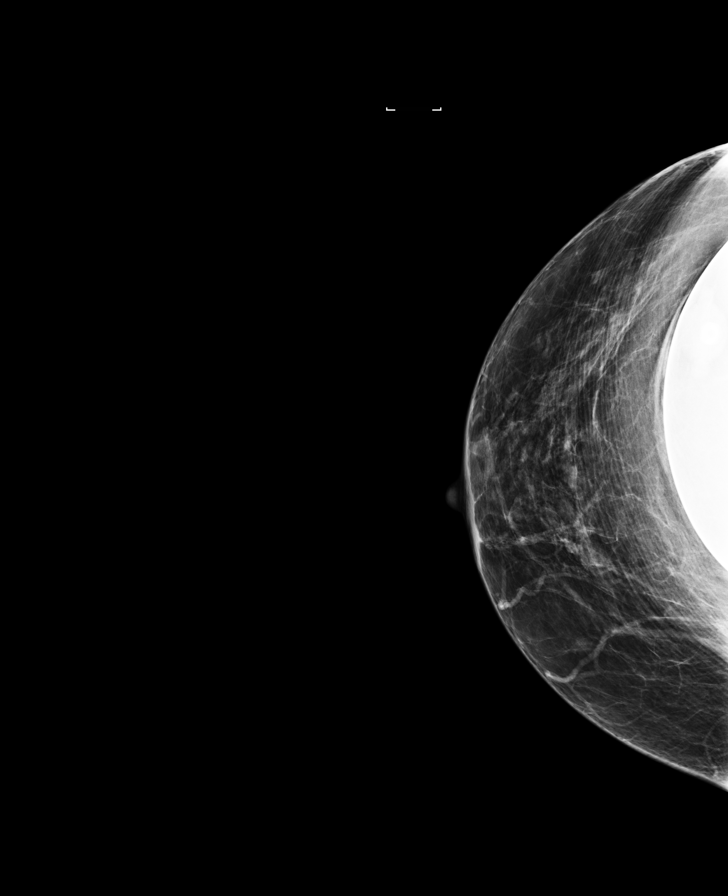

[R CC synth-2D]
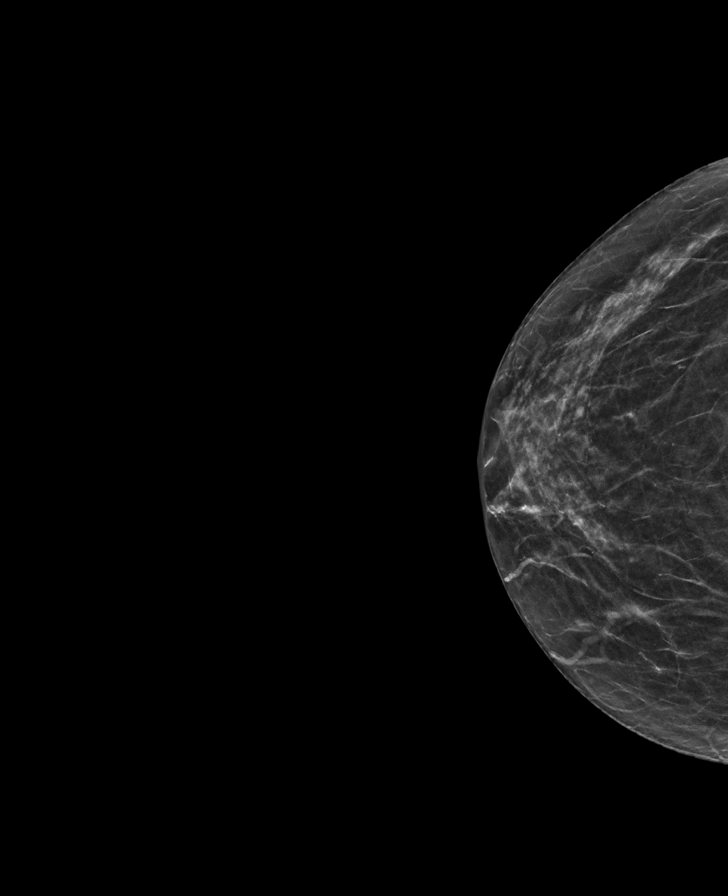

[L CC synth-2D]
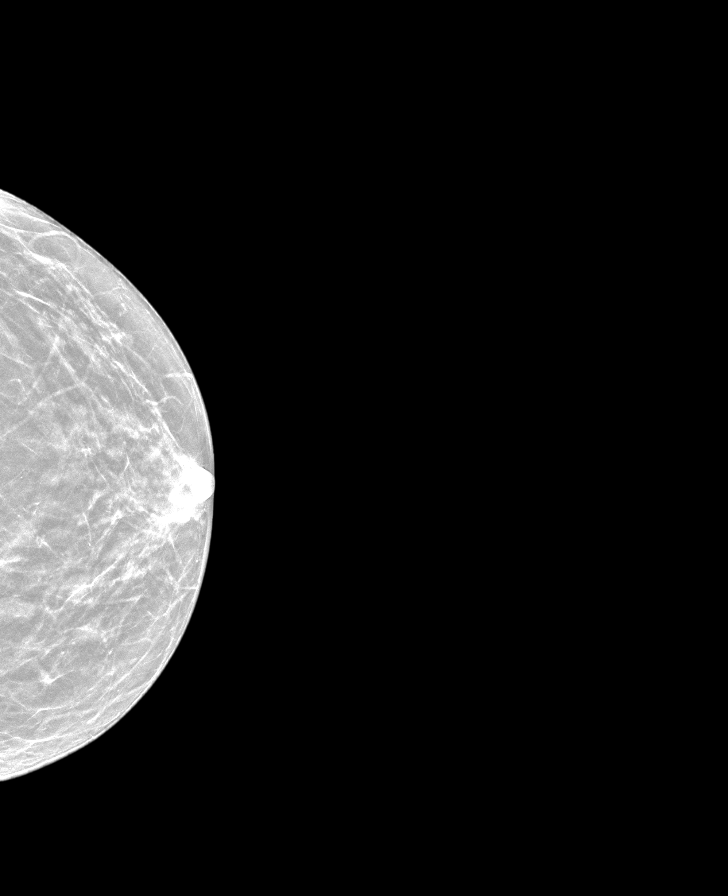

[L MLO synth-2D]
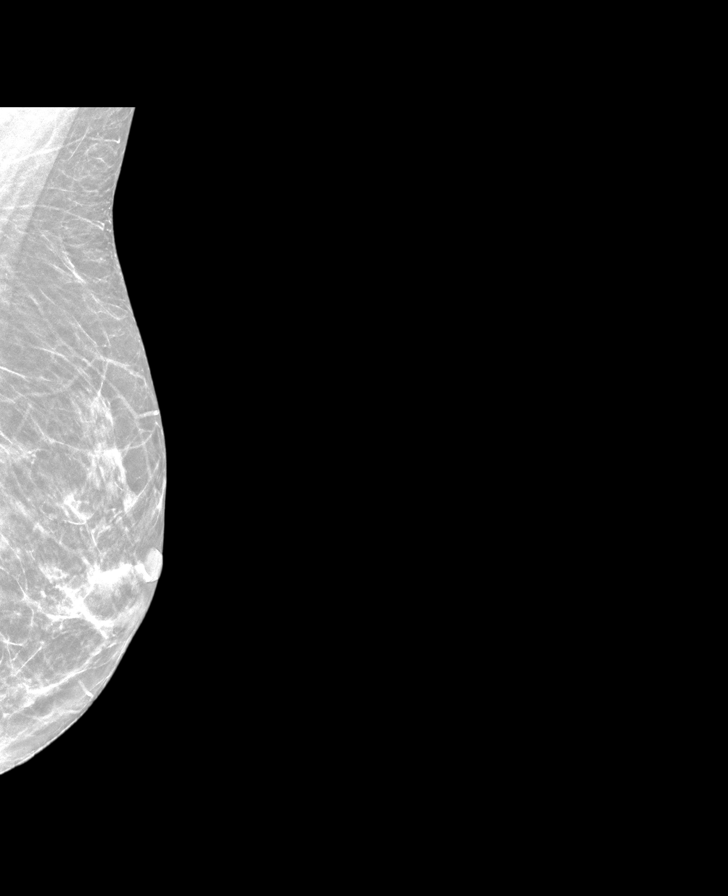

[R MLO synth-2D]
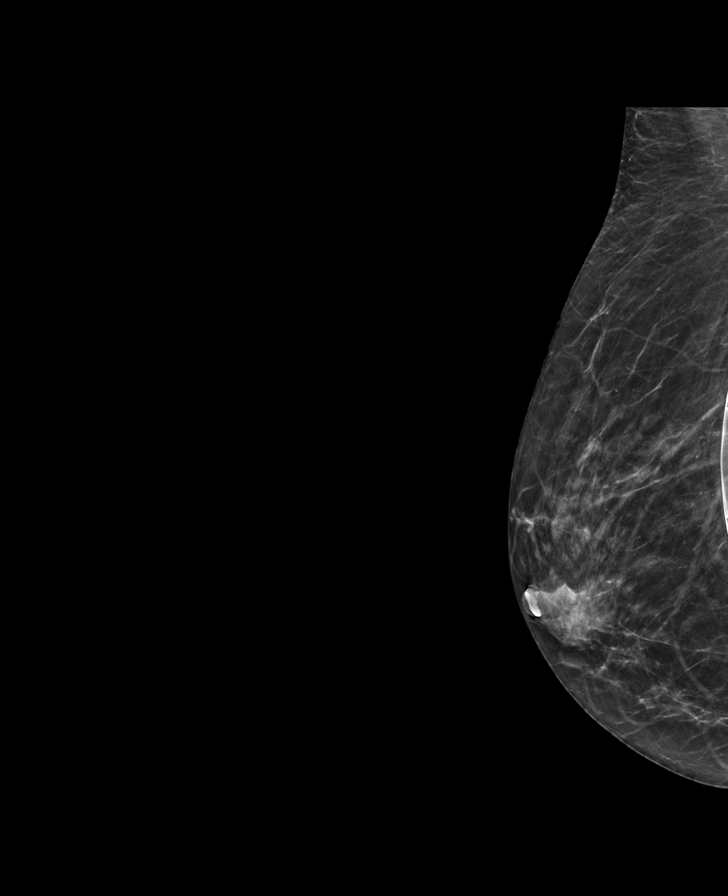

[8 of 28 positions shown; findings below may reference images not displayed]

ACR Breast Density Category b: There are scattered areas of
fibroglandular density.
FINDINGS: There are no findings suspicious for malignancy. Images were
processed with CAD.
IMPRESSION: No mammographic evidence of malignancy. A result letter of this
screening mammogram will be mailed directly to the patient.

RECOMMENDATION:
Screening mammogram in one year. (Code:60-[id number])

BI-RADS CATEGORY  1:  Negative.

## 2020-04-13 ENCOUNTER — Other Ambulatory Visit: Payer: Self-pay | Admitting: Psychiatry

## 2020-04-13 DIAGNOSIS — F411 Generalized anxiety disorder: Secondary | ICD-10-CM

## 2020-05-03 ENCOUNTER — Telehealth: Payer: Self-pay

## 2020-05-03 NOTE — Telephone Encounter (Signed)
Prior authorization submitted and approved for ALPRAZOLAM 0.5 MG #90/90 DAY, effective 04/03/2020-05/03/2021, PA# [id number] with [facility] [employer] ID# [id number]

## 2020-05-13 ENCOUNTER — Telehealth: Payer: Self-pay | Admitting: Primary Care

## 2020-05-13 DIAGNOSIS — R911 Solitary pulmonary nodule: Secondary | ICD-10-CM

## 2020-05-13 NOTE — Telephone Encounter (Signed)
Orders placed. Imaging center will contact her regarding scheduling.

## 2020-05-13 NOTE — Telephone Encounter (Signed)
Pt needs orders place to repeat CT Chest Scan per her last CT Scan. She said if possible can it be scheduled the week of September 13th. She is not in [place] much due to her renovating a house.

## 2020-05-26 ENCOUNTER — Ambulatory Visit: Payer: Medicare Other

## 2020-06-08 ENCOUNTER — Ambulatory Visit
Admission: RE | Admit: 2020-06-08 | Discharge: 2020-06-08 | Disposition: A | Discharge status: Home / Self Care | Payer: Medicare Other | Source: Ambulatory Visit | Attending: Primary Care | Admitting: Primary Care

## 2020-06-08 ENCOUNTER — Other Ambulatory Visit: Payer: Self-pay

## 2020-06-08 DIAGNOSIS — I700 Atherosclerosis of aorta: Secondary | ICD-10-CM | POA: Diagnosis not present

## 2020-06-08 DIAGNOSIS — R911 Solitary pulmonary nodule: Secondary | ICD-10-CM | POA: Diagnosis not present

## 2020-06-08 DIAGNOSIS — J984 Other disorders of lung: Secondary | ICD-10-CM | POA: Diagnosis not present

## 2020-06-08 DIAGNOSIS — R918 Other nonspecific abnormal finding of lung field: Secondary | ICD-10-CM | POA: Diagnosis not present

## 2020-06-15 ENCOUNTER — Other Ambulatory Visit: Payer: Self-pay | Admitting: Psychiatry

## 2020-06-15 DIAGNOSIS — H6692 Otitis media, unspecified, left ear: Secondary | ICD-10-CM | POA: Diagnosis not present

## 2020-06-15 DIAGNOSIS — F411 Generalized anxiety disorder: Secondary | ICD-10-CM

## 2020-07-01 DIAGNOSIS — J019 Acute sinusitis, unspecified: Secondary | ICD-10-CM | POA: Diagnosis not present

## 2020-07-05 DIAGNOSIS — L570 Actinic keratosis: Secondary | ICD-10-CM | POA: Diagnosis not present

## 2020-07-05 DIAGNOSIS — B078 Other viral warts: Secondary | ICD-10-CM | POA: Diagnosis not present

## 2020-07-05 DIAGNOSIS — L538 Other specified erythematous conditions: Secondary | ICD-10-CM | POA: Diagnosis not present

## 2020-09-02 ENCOUNTER — Other Ambulatory Visit: Payer: Self-pay | Admitting: Obstetrics and Gynecology

## 2020-09-02 DIAGNOSIS — Z1231 Encounter for screening mammogram for malignant neoplasm of breast: Secondary | ICD-10-CM

## 2020-09-24 DIAGNOSIS — Z23 Encounter for immunization: Secondary | ICD-10-CM | POA: Diagnosis not present

## 2020-10-07 DIAGNOSIS — Z86010 Personal history of colonic polyps: Secondary | ICD-10-CM | POA: Diagnosis not present

## 2020-10-07 DIAGNOSIS — Z800 Family history of malignant neoplasm of digestive organs: Secondary | ICD-10-CM | POA: Diagnosis not present

## 2020-10-07 DIAGNOSIS — Z1211 Encounter for screening for malignant neoplasm of colon: Secondary | ICD-10-CM | POA: Diagnosis not present

## 2020-10-07 DIAGNOSIS — K5904 Chronic idiopathic constipation: Secondary | ICD-10-CM | POA: Diagnosis not present

## 2020-10-07 DIAGNOSIS — R194 Change in bowel habit: Secondary | ICD-10-CM | POA: Diagnosis not present

## 2020-10-13 ENCOUNTER — Ambulatory Visit: Payer: Medicare Other | Admitting: Psychiatry

## 2020-10-13 DIAGNOSIS — D122 Benign neoplasm of ascending colon: Secondary | ICD-10-CM | POA: Diagnosis not present

## 2020-10-13 DIAGNOSIS — Z1211 Encounter for screening for malignant neoplasm of colon: Secondary | ICD-10-CM | POA: Diagnosis not present

## 2020-10-13 DIAGNOSIS — K635 Polyp of colon: Secondary | ICD-10-CM | POA: Diagnosis not present

## 2020-10-13 DIAGNOSIS — K5730 Diverticulosis of large intestine without perforation or abscess without bleeding: Secondary | ICD-10-CM | POA: Diagnosis not present

## 2020-10-13 HISTORY — PX: COLONOSCOPY: SHX174

## 2020-10-15 DIAGNOSIS — L720 Epidermal cyst: Secondary | ICD-10-CM | POA: Diagnosis not present

## 2020-10-15 DIAGNOSIS — D0462 Carcinoma in situ of skin of left upper limb, including shoulder: Secondary | ICD-10-CM | POA: Diagnosis not present

## 2020-10-15 DIAGNOSIS — D485 Neoplasm of uncertain behavior of skin: Secondary | ICD-10-CM | POA: Diagnosis not present

## 2020-11-02 DIAGNOSIS — C44629 Squamous cell carcinoma of skin of left upper limb, including shoulder: Secondary | ICD-10-CM | POA: Diagnosis not present

## 2020-11-05 ENCOUNTER — Ambulatory Visit: Payer: Self-pay

## 2020-11-05 ENCOUNTER — Other Ambulatory Visit: Payer: Self-pay

## 2020-11-17 DIAGNOSIS — M955 Acquired deformity of pelvis: Secondary | ICD-10-CM | POA: Diagnosis not present

## 2020-11-17 DIAGNOSIS — M9905 Segmental and somatic dysfunction of pelvic region: Secondary | ICD-10-CM | POA: Diagnosis not present

## 2020-11-17 DIAGNOSIS — M62830 Muscle spasm of back: Secondary | ICD-10-CM | POA: Diagnosis not present

## 2020-11-17 DIAGNOSIS — M9903 Segmental and somatic dysfunction of lumbar region: Secondary | ICD-10-CM | POA: Diagnosis not present

## 2020-11-22 ENCOUNTER — Ambulatory Visit: Payer: Medicare Other

## 2020-11-30 DIAGNOSIS — G510 Bell's palsy: Secondary | ICD-10-CM | POA: Diagnosis not present

## 2020-11-30 DIAGNOSIS — K131 Cheek and lip biting: Secondary | ICD-10-CM | POA: Diagnosis not present

## 2020-12-05 DIAGNOSIS — N390 Urinary tract infection, site not specified: Secondary | ICD-10-CM | POA: Diagnosis not present

## 2020-12-05 DIAGNOSIS — M549 Dorsalgia, unspecified: Secondary | ICD-10-CM | POA: Diagnosis not present

## 2020-12-05 DIAGNOSIS — J0100 Acute maxillary sinusitis, unspecified: Secondary | ICD-10-CM | POA: Diagnosis not present

## 2020-12-05 DIAGNOSIS — J3489 Other specified disorders of nose and nasal sinuses: Secondary | ICD-10-CM | POA: Diagnosis not present

## 2020-12-05 DIAGNOSIS — R300 Dysuria: Secondary | ICD-10-CM | POA: Diagnosis not present

## 2020-12-06 ENCOUNTER — Other Ambulatory Visit: Payer: Self-pay

## 2020-12-06 ENCOUNTER — Encounter: Payer: Self-pay | Admitting: Psychiatry

## 2020-12-06 ENCOUNTER — Ambulatory Visit (INDEPENDENT_AMBULATORY_CARE_PROVIDER_SITE_OTHER): Payer: Medicare Other | Admitting: Psychiatry

## 2020-12-06 DIAGNOSIS — F411 Generalized anxiety disorder: Secondary | ICD-10-CM

## 2020-12-06 DIAGNOSIS — F325 Major depressive disorder, single episode, in full remission: Secondary | ICD-10-CM | POA: Diagnosis not present

## 2020-12-06 MED ORDER — ESCITALOPRAM OXALATE 20 MG PO TABS: ORAL_TABLET | ORAL | 3 refills | Status: DC

## 2020-12-06 NOTE — Progress Notes (Signed)
Andrea [name] [id number] 08/18/1951 70 y.o.  Subjective:   Patient ID:  Andrea [name] is a 70 y.o. (DOB 08-23-1951) female.  Chief Complaint:  Chief Complaint  Patient presents with  . Follow-up  . Major depression in complete remission ([place])  . Depression  . Anxiety    HPI Andrea [name] presents to the office today for follow-up of MDE and anxiety disorder.  Last seen 09/2019.  No meds were changed.  Been on Lexapro 20 since early 2017.  02/27/2020 phone call from patient: Pt experiencing more anxiety than she normally has. Pt would like to know what are some options that she has as far as her medication. Pt will be in an appt  MD response: If the anxiety is just occasional then using alprazolam as needed would be an okay thing to do.  If it is been fairly persistent for a couple of weeks or more than the simplest solution would be to increase the Lexapro to 1-1/2 tablets daily.  Historically she has done well on Lexapro 20 mg daily for an extended period of time.  If she is under temporary stress then once that is resolved we could drop it back to 1 daily.  If this does not work as expected then she should schedule an earlier appointment. Pt response: Patient called back and she's been having temporary stress, but it's not bad. Things are great, she's working out and feeling okay for the most part. It's been going on for a bit so she agrees to the increase Lexapro 20 mg 1.5 tablets daily. She has been taking alprazolam at hs occasionally to help sleep better and it does help.   12/06/2020 appointment with the following noted:  No covid.  H Covid and recovered.   Had increased Lexapro to 30 mg for 90 days and saw a difference but started seeing weight gain and was doing oK and able to drop back and been OK. Still renovating a house for a year.  Sold her house and mourns the house. Calmed down.  Kept 3 gkids and dogs 10 days.   [place] for new house May 1.  Rare alprazolam for  HS.  Stress moving to condo and would have crying spell over the move and some problems she was running into dealing with it.  Hard with change.  But taking time to adjust to the idea of moving from her house of 23 years.  Initially refused but he left it up to her and she's come to peace about it.  [name]    Good response to medication and no SE.  Exercising is good medicine.  If doesn't then doesn't feel as good. Satisfied.  Patient reports stable mood and denies depressed or irritable moods.  Patient denies any recent difficulty with anxiety.  Patient denies difficulty with sleep initiation or maintenance. Denies appetite disturbance.  Patient reports that energy and motivation have been good. Patient denies any difficulty with concentration.  Patient denies any suicidal ideation.   [name] with a little dementia.  Past Psychiatric Medication Trials: Sertraline, fluoxetine, citalopram 50, nefazodone, mirtazapine, Lexapro 20, Viibryd 30, Wellbutrin 300, methyl folate  buspirone, alprazolam, clonazepam, Under the care of [facility] since January 2001  Review of Systems:  Review of Systems  Cardiovascular: Negative for palpitations.  Neurological: Negative for tremors and weakness.  Psychiatric/Behavioral: Negative for agitation, behavioral problems, confusion, decreased concentration, dysphoric mood, hallucinations, self-injury, sleep disturbance and suicidal ideas. The patient is not  nervous/anxious and is not hyperactive.     Medications: I have reviewed the patient's current medications.  Current Outpatient Medications  Medication Sig Dispense Refill  . ALPRAZolam (XANAX) 0.5 MG tablet TAKE 1 TABLET BY MOUTH AT BEDTIME AS NEEDED FOR ANXIETY. (Patient taking differently: Pt rarely takes.) 90 tablet 0  . atorvastatin (LIPITOR) 20 MG tablet Take 20 mg by mouth.     . calcium citrate-vitamin D (CITRACAL+D) 315-200 MG-UNIT tablet Take by mouth.    . fluticasone  (FLONASE) 50 MCG/ACT nasal spray fluticasone propionate 50 mcg/actuation nasal spray,suspension    . levothyroxine (SYNTHROID, LEVOTHROID) 50 MCG tablet Take 50 mcg by mouth daily before breakfast.    . raloxifene (EVISTA) 60 MG tablet raloxifene 60 mg tablet    . XIIDRA 5 % SOLN     . escitalopram (LEXAPRO) 20 MG tablet Pt takes 1 tab daily. 90 tablet 3   No current facility-administered medications for this visit.    Medication Side Effects: None  Allergies: No Known Allergies  Past Medical History:  Diagnosis Date  . GAD (generalized anxiety disorder)   . Hyperlipidemia   . Hypothyroidism   . Osteopenia   . Sinusitis     Family History  Problem Relation Age of Onset  . Arthritis Mother   . COPD Mother   . Hypercholesterolemia Mother   . Dementia Mother   . Stroke Mother   . Prostate cancer Brother   . Cancer Maternal Grandmother        Oral    Social History   Socioeconomic History  . Marital status: Married    Spouse name: Not on file  . Number of children: Not on file  . Years of education: Not on file  . Highest education level: Not on file  Occupational History  . Not on file  Tobacco Use  . Smoking status: Never Smoker  . Smokeless tobacco: Never Used  Substance and Sexual Activity  . Alcohol use: Not on file  . Drug use: Not on file  . Sexual activity: Not on file  Other Topics Concern  . Not on file  Social History Narrative   Married.   1 child. 2 grandchildren.   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   Enjoys exercising, spending time with family   Social Determinants of Health   Financial Resource Strain: Not on file  Food Insecurity: Not on file  Transportation Needs: Not on file  Physical Activity: Not on file  Stress: Not on file  Social Connections: Not on file  Intimate Partner Violence: Not on file    Past Medical History, Surgical history, Social history, and Family history were reviewed and updated as appropriate.   3  grandsons.  Please see review of systems for further details on the patient's review from today.   Objective:   Physical Exam:  There were no vitals taken for this visit.  Physical Exam Constitutional:      General: She is not in acute distress.    Appearance: She is well-developed.  Musculoskeletal:        General: No deformity.  Neurological:     Mental Status: She is alert and oriented to person, place, and time.     Motor: No tremor.     Coordination: Coordination normal.     Gait: Gait normal.  Psychiatric:        Attention and Perception: She is attentive. She does not perceive auditory hallucinations.  Mood and Affect: Mood is not anxious or depressed. Affect is not labile, blunt, angry or inappropriate.        Speech: Speech normal.        Behavior: Behavior normal.        Thought Content: Thought content normal. Thought content does not include homicidal or suicidal ideation. Thought content does not include homicidal or suicidal plan.        Cognition and Memory: Cognition and memory normal.        Judgment: Judgment normal.     Comments: Insight intact. No auditory or visual hallucinations. No delusions.      Lab Review:  No results found for: NA, K, CL, CO2, GLUCOSE, BUN, CREATININE, CALCIUM, PROT, ALBUMIN, AST, ALT, ALKPHOS, BILITOT, GFRNONAA, GFRAA  No results found for: WBC, RBC, HGB, HCT, PLT, MCV, MCH, MCHC, RDW, LYMPHSABS, MONOABS, EOSABS, BASOSABS  No results found for: POCLITH, LITHIUM   No results found for: PHENYTOIN, PHENOBARB, VALPROATE, CBMZ   .res Assessment: Plan:    Major depression in complete remission ([place]) - Plan: escitalopram (LEXAPRO) 20 MG tablet  Generalized anxiety disorder - Plan: escitalopram (LEXAPRO) 20 MG tablet   Greater than 50% of face to face time with patient was spent on counseling and coordination of care. We discussed her recurrent depression is well-controlled and anxiety.  No complaints and doesn't want any  changes.  Disc poss FU with PCP.  She wants to continue here.  No Bz abuse.  Using very little.  Continue Lexapro 20.    This appointment was 15 minutes  FU 1 year.  Lynder Parents, MD, DFAPA   Please see After Visit Summary for patient specific instructions.  Future Appointments  Date Time Provider [place]  12/28/2020  1:00 PM [facility] 2 [facility]    No orders of the defined types were placed in this encounter.     -------------------------------

## 2020-12-07 DIAGNOSIS — G510 Bell's palsy: Secondary | ICD-10-CM | POA: Diagnosis not present

## 2020-12-07 DIAGNOSIS — K131 Cheek and lip biting: Secondary | ICD-10-CM | POA: Diagnosis not present

## 2020-12-08 ENCOUNTER — Telehealth: Payer: Self-pay | Admitting: Primary Care

## 2020-12-08 NOTE — Telephone Encounter (Signed)
Pt called in wanted to know about getting a chest x-ray she normally gets it done every 6 months

## 2020-12-09 NOTE — Telephone Encounter (Signed)
Please notify patient that she is due for follow-up with me in April, we can discuss and schedule at that time.  Have her set up an appointment with me at her convenience.

## 2020-12-09 NOTE — Telephone Encounter (Signed)
Called patient reviewed message did not have callander will call back later to set up appointment.

## 2020-12-09 NOTE — Telephone Encounter (Signed)
Will route to PCP for review, should not have been sent to me

## 2020-12-17 DIAGNOSIS — E039 Hypothyroidism, unspecified: Secondary | ICD-10-CM | POA: Diagnosis not present

## 2020-12-17 DIAGNOSIS — Z13228 Encounter for screening for other metabolic disorders: Secondary | ICD-10-CM | POA: Diagnosis not present

## 2020-12-17 DIAGNOSIS — Z131 Encounter for screening for diabetes mellitus: Secondary | ICD-10-CM | POA: Diagnosis not present

## 2020-12-17 DIAGNOSIS — E559 Vitamin D deficiency, unspecified: Secondary | ICD-10-CM | POA: Diagnosis not present

## 2020-12-27 DIAGNOSIS — Z01419 Encounter for gynecological examination (general) (routine) without abnormal findings: Secondary | ICD-10-CM | POA: Diagnosis not present

## 2020-12-27 DIAGNOSIS — Z6824 Body mass index (BMI) 24.0-24.9, adult: Secondary | ICD-10-CM | POA: Diagnosis not present

## 2020-12-28 ENCOUNTER — Ambulatory Visit
Admission: RE | Admit: 2020-12-28 | Discharge: 2020-12-28 | Disposition: A | Discharge status: Home / Self Care | Payer: Medicare Other | Source: Ambulatory Visit | Attending: Obstetrics and Gynecology | Admitting: Obstetrics and Gynecology

## 2020-12-28 ENCOUNTER — Other Ambulatory Visit: Payer: Self-pay

## 2020-12-28 DIAGNOSIS — Z1231 Encounter for screening mammogram for malignant neoplasm of breast: Secondary | ICD-10-CM | POA: Diagnosis not present

## 2021-01-10 ENCOUNTER — Other Ambulatory Visit: Payer: Self-pay | Admitting: Psychiatry

## 2021-01-17 ENCOUNTER — Other Ambulatory Visit: Payer: Self-pay | Admitting: Psychiatry

## 2021-01-27 DIAGNOSIS — U071 COVID-19: Secondary | ICD-10-CM | POA: Diagnosis not present

## 2021-01-27 DIAGNOSIS — H6120 Impacted cerumen, unspecified ear: Secondary | ICD-10-CM | POA: Diagnosis not present

## 2021-02-07 DIAGNOSIS — J0140 Acute pansinusitis, unspecified: Secondary | ICD-10-CM | POA: Diagnosis not present

## 2021-02-27 DIAGNOSIS — R300 Dysuria: Secondary | ICD-10-CM | POA: Diagnosis not present

## 2021-02-27 DIAGNOSIS — N390 Urinary tract infection, site not specified: Secondary | ICD-10-CM | POA: Diagnosis not present

## 2021-03-16 ENCOUNTER — Other Ambulatory Visit: Payer: Self-pay | Admitting: Psychiatry

## 2021-04-05 DIAGNOSIS — D2271 Melanocytic nevi of right lower limb, including hip: Secondary | ICD-10-CM | POA: Diagnosis not present

## 2021-04-05 DIAGNOSIS — D2261 Melanocytic nevi of right upper limb, including shoulder: Secondary | ICD-10-CM | POA: Diagnosis not present

## 2021-04-05 DIAGNOSIS — Z872 Personal history of diseases of the skin and subcutaneous tissue: Secondary | ICD-10-CM | POA: Diagnosis not present

## 2021-04-05 DIAGNOSIS — L821 Other seborrheic keratosis: Secondary | ICD-10-CM | POA: Diagnosis not present

## 2021-04-05 DIAGNOSIS — D2262 Melanocytic nevi of left upper limb, including shoulder: Secondary | ICD-10-CM | POA: Diagnosis not present

## 2021-04-05 DIAGNOSIS — Z85828 Personal history of other malignant neoplasm of skin: Secondary | ICD-10-CM | POA: Diagnosis not present

## 2021-04-05 DIAGNOSIS — Z09 Encounter for follow-up examination after completed treatment for conditions other than malignant neoplasm: Secondary | ICD-10-CM | POA: Diagnosis not present

## 2021-04-09 ENCOUNTER — Other Ambulatory Visit: Payer: Self-pay | Admitting: Psychiatry

## 2021-04-12 DIAGNOSIS — U071 COVID-19: Secondary | ICD-10-CM | POA: Diagnosis not present

## 2021-04-20 ENCOUNTER — Telehealth: Payer: Self-pay | Admitting: Psychiatry

## 2021-04-20 NOTE — Telephone Encounter (Signed)
Pt called and said that she feels that the lexapro is not working. She feels like she needs a different medicine. She doesn't want to increase the lexapro anymore. Please call her at [phone]

## 2021-04-20 NOTE — Telephone Encounter (Signed)
LM to call back to discuss.

## 2021-04-21 NOTE — Telephone Encounter (Signed)
Pt stated she is not in a state of emergency or self harm and this can wait until you return.She thinks she should try a different med because she is worried and can't move past stuff.She thinks she is just burned out from recently renovating a house.She takes Lexapro 20 mg and 10 mg to equal 30 mg daily and she does not want to increase.

## 2021-04-26 ENCOUNTER — Telehealth: Payer: Self-pay | Admitting: Psychiatry

## 2021-04-26 NOTE — Telephone Encounter (Signed)
RTC LM Alianys Chacko Cottle, MD, DFAPA  

## 2021-04-26 NOTE — Telephone Encounter (Signed)
Pt  left a message stating that  she would like to talk to traci again. Please give her a call at [phone]

## 2021-04-29 NOTE — Telephone Encounter (Signed)
RTC twice.  LM

## 2021-05-02 NOTE — Telephone Encounter (Signed)
She called back and thanks for calling her. She feels that she is doing much better now. No need to call her

## 2021-05-03 NOTE — Telephone Encounter (Signed)
PT now says she does not need to be seen earlier than scheduled> remove her from cancellation list

## 2021-05-04 DIAGNOSIS — S80812A Abrasion, left lower leg, initial encounter: Secondary | ICD-10-CM | POA: Diagnosis not present

## 2021-05-04 DIAGNOSIS — S50312A Abrasion of left elbow, initial encounter: Secondary | ICD-10-CM | POA: Diagnosis not present

## 2021-05-04 DIAGNOSIS — S60811A Abrasion of right wrist, initial encounter: Secondary | ICD-10-CM | POA: Diagnosis not present

## 2021-05-04 DIAGNOSIS — W19XXXA Unspecified fall, initial encounter: Secondary | ICD-10-CM | POA: Diagnosis not present

## 2021-05-12 ENCOUNTER — Telehealth: Payer: Self-pay | Admitting: Psychiatry

## 2021-05-12 NOTE — Telephone Encounter (Signed)
LouAnn would like for someone to call her about her Lexapro. She has a couple of questions to ask. Her phone number is [phone].

## 2021-05-23 ENCOUNTER — Telehealth: Payer: Self-pay | Admitting: Primary Care

## 2021-05-23 NOTE — Telephone Encounter (Signed)
Pt called to have an order placed for her lung scan done at [facility] outpatient imaging

## 2021-05-24 NOTE — Telephone Encounter (Signed)
Called pt to schedule appt. Pt did not answer so I left a VM.

## 2021-05-24 NOTE — Telephone Encounter (Signed)
Please notify patient that I am very happy to place the order, however, I have not seen her since April of 2021 and she will need a follow up visit first.   Please schedule her for general follow up in any open slot.

## 2021-05-25 DIAGNOSIS — J019 Acute sinusitis, unspecified: Secondary | ICD-10-CM | POA: Diagnosis not present

## 2021-05-25 DIAGNOSIS — H6121 Impacted cerumen, right ear: Secondary | ICD-10-CM | POA: Diagnosis not present

## 2021-06-14 ENCOUNTER — Encounter: Payer: Self-pay | Admitting: Primary Care

## 2021-06-14 ENCOUNTER — Other Ambulatory Visit: Payer: Self-pay

## 2021-06-14 ENCOUNTER — Telehealth (INDEPENDENT_AMBULATORY_CARE_PROVIDER_SITE_OTHER): Payer: Medicare Other | Admitting: Primary Care

## 2021-06-14 VITALS — Ht 62.25 in | Wt 128.0 lb

## 2021-06-14 DIAGNOSIS — R911 Solitary pulmonary nodule: Secondary | ICD-10-CM | POA: Diagnosis not present

## 2021-06-14 DIAGNOSIS — E039 Hypothyroidism, unspecified: Secondary | ICD-10-CM

## 2021-06-14 DIAGNOSIS — M8580 Other specified disorders of bone density and structure, unspecified site: Secondary | ICD-10-CM

## 2021-06-14 DIAGNOSIS — F411 Generalized anxiety disorder: Secondary | ICD-10-CM | POA: Diagnosis not present

## 2021-06-14 NOTE — Assessment & Plan Note (Addendum)
Compliant to Evista 60 mg, though density scan up-to-date per patient.  Follows with GYN who is out of her system.

## 2021-06-14 NOTE — Assessment & Plan Note (Signed)
Well-controlled on Lexapro 20 mg daily, no recent use of alprazolam.  Follows with psychiatry, continue Lexapro 20 mg daily.

## 2021-06-14 NOTE — Progress Notes (Signed)
Patient ID: Andrea [name], female    DOB: 06-May-1951, 70 y.o.   MRN: [id number]  Virtual visit completed through [employer], a video enabled telemedicine application. Due to national recommendations of social distancing due to COVID-19, a virtual visit is felt to be most appropriate for this patient at this time. Reviewed limitations, risks, security and privacy concerns of performing a virtual visit and the availability of in person appointments. I also reviewed that there may be a patient responsible charge related to this service. The patient agreed to proceed.   Patient location: home Provider location: [place] at [facility], office Persons participating in this virtual visit: patient, provider   If any vitals were documented, they were collected by patient at home unless specified below.    Ht 5' 2.25" (1.581 m)   Wt 128 lb (58.1 kg)   BMI 23.22 kg/m    CC: Follow up Subjective:   HPI: Andrea [name] is a 70 y.o. female with a history of hypothyroidism, hyperlipidemia, GAD, pulmonary nodule presenting on 06/14/2021 for follow up of chronic conditions and for repeat CT chest.  Due for repeat CT chest for monitoring of bilateral pulmonary nodules with the largest measuring 6 mm to the left upper lobe.  She denies chest pain, shortness of breath, unexplained weight loss.  Follows with GYN for hypothyroidism, managed on levothyroxine 50 mcg. Last TSH was 1.5 in January 2022. Also managed on Evista 60 mg daily for osteopenia. Bone density scan is UTD per patient.   Following with psychiatry for anxiety. Currently managed on Lexapro 20 mg daily and alprazolam 0.5 mg PRN. She never uses her alprazolam.   BP Readings from Last 3 Encounters:  12/31/19 122/82  07/17/19 116/66  06/24/19 120/72            Relevant past medical, surgical, family and social history reviewed and updated as indicated. Interim medical history since our last visit reviewed. Allergies and medications  reviewed and updated. Outpatient Medications Prior to Visit  Medication Sig Dispense Refill   ALPRAZolam (XANAX) 0.5 MG tablet TAKE 1 TABLET BY MOUTH AT BEDTIME AS NEEDED FOR ANXIETY. (Patient taking differently: Pt rarely takes.) 90 tablet 0   atorvastatin (LIPITOR) 20 MG tablet Take 20 mg by mouth.      calcium citrate-vitamin D (CITRACAL+D) 315-200 MG-UNIT tablet Take by mouth.     escitalopram (LEXAPRO) 20 MG tablet Pt takes 1 tab daily. 90 tablet 3   fluticasone (FLONASE) 50 MCG/ACT nasal spray fluticasone propionate 50 mcg/actuation nasal spray,suspension     levothyroxine (SYNTHROID, LEVOTHROID) 50 MCG tablet Take 50 mcg by mouth daily before breakfast.     raloxifene (EVISTA) 60 MG tablet raloxifene 60 mg tablet     XIIDRA 5 % SOLN      No facility-administered medications prior to visit.     Per HPI unless specifically indicated in ROS section below Review of Systems  Constitutional:  Negative for unexpected weight change.  Respiratory:  Negative for shortness of breath.   Cardiovascular:  Negative for chest pain.  Psychiatric/Behavioral:  The patient is not nervous/anxious.   Objective:  Ht 5' 2.25" (1.581 m)   Wt 128 lb (58.1 kg)   BMI 23.22 kg/m   Wt Readings from Last 3 Encounters:  06/14/21 128 lb (58.1 kg)  07/17/19 135 lb (61.2 kg)  06/24/19 135 lb 8 oz (61.5 kg)       Physical exam: Gen: alert, NAD, not ill appearing Pulm: speaks in complete sentences without  increased work of breathing Psych: normal mood, normal thought content      Results for orders placed or performed in visit on 07/17/19  Urine Culture   Specimen: Urine  Result Value Ref Range   MICRO NUMBER: [id number]    SPECIMEN QUALITY: Adequate    Sample Source URINE    STATUS: FINAL    ISOLATE 1: Escherichia coli (A)       Susceptibility   Escherichia coli - URINE CULTURE, REFLEX    AMOX/CLAVULANIC 8 Sensitive     AMPICILLIN >=32 Resistant     AMPICILLIN/SULBACTAM 8 Sensitive      CEFAZOLIN* <=4 Not Reportable      * For infections other than uncomplicated UTIcaused by E. coli, K. pneumoniae or P. mirabilis:Cefazolin is resistant if MIC > or = 8 mcg/mL.(Distinguishing susceptible versus intermediatefor isolates with MIC < or = 4 mcg/mL requiresadditional testing.)For uncomplicated UTI caused by E. coli,K. pneumoniae or P. mirabilis: Cefazolin issusceptible if MIC <32 mcg/mL and predictssusceptible to the oral agents cefaclor, cefdinir,cefpodoxime, cefprozil, cefuroxime, cephalexinand loracarbef.    CEFEPIME <=1 Sensitive     CEFTRIAXONE <=1 Sensitive     CIPROFLOXACIN <=0.25 Sensitive     LEVOFLOXACIN <=0.12 Sensitive     ERTAPENEM <=0.5 Sensitive     GENTAMICIN <=1 Sensitive     IMIPENEM <=0.25 Sensitive     NITROFURANTOIN <=16 Sensitive     PIP/TAZO <=4 Sensitive     TOBRAMYCIN <=1 Sensitive     TRIMETH/SULFA* <=20 Sensitive      * For infections other than uncomplicated UTIcaused by E. coli, K. pneumoniae or P. mirabilis:Cefazolin is resistant if MIC > or = 8 mcg/mL.(Distinguishing susceptible versus intermediatefor isolates with MIC < or = 4 mcg/mL requiresadditional testing.)For uncomplicated UTI caused by E. coli,K. pneumoniae or P. mirabilis: Cefazolin issusceptible if MIC <32 mcg/mL and predictssusceptible to the oral agents cefaclor, cefdinir,cefpodoxime, cefprozil, cefuroxime, cephalexinand loracarbef.Legend:S = Susceptible  I = IntermediateR = Resistant  NS = Not susceptible* = Not tested  NR = Not reported**NN = See antimicrobic comments  POCT Urinalysis Dipstick (Automated)  Result Value Ref Range   Color, UA Yellow    Clarity, UA Cloudy    Glucose, UA Positive (A) Negative   Bilirubin, UA Neg    Ketones, UA Neg    Spec Grav, UA 1.010 1.010 - 1.025   Blood, UA 1+    pH, UA 6.0 5.0 - 8.0   Protein, UA Negative Negative   Urobilinogen, UA 0.2 0.2 or 1.0 E.U./dL   Nitrite, UA Neg    Leukocytes, UA Large (3+) (A) Negative   Assessment & Plan:   Problem  List Items Addressed This Visit       Endocrine   Hypothyroidism    Following with GYN, TSH from January 2022 stable. Continue levothyroxine 50 mcg        Musculoskeletal and Integument   Osteopenia    Compliant to Evista 60 mg, though density scan up-to-date per patient.  Follows with GYN who is out of her system.        Other   GAD (generalized anxiety disorder)    Well-controlled on Lexapro 20 mg daily, no recent use of alprazolam.  Follows with psychiatry, continue Lexapro 20 mg daily.      Pulmonary nodule, left - Primary    Due for follow-up CT chest without contrast. Orders placed.  Await results.      Relevant Orders   CT Chest Wo Contrast  No orders of the defined types were placed in this encounter.  Orders Placed This Encounter  Procedures   CT Chest Wo Contrast    70 year old female needing repeat CT chest for evaluation of left upper lobe lung nodule.    Standing Status:   Future    Standing Expiration Date:   06/14/2022    Order Specific Question:   Preferred imaging location?    Answer:   [facility]    I discussed the assessment and treatment plan with the patient. The patient was provided an opportunity to ask questions and all were answered. The patient agreed with the plan and demonstrated an understanding of the instructions. The patient was advised to call back or seek an in-person evaluation if the symptoms worsen or if the condition fails to improve as anticipated.  Follow up plan:  You will be contacted regarding your CT chest.  Please let us know if you have not been contacted within two weeks.   It was a pleasure to see you today!    Pleas Koch, NP

## 2021-06-14 NOTE — Assessment & Plan Note (Signed)
Following with GYN, TSH from January 2022 stable. Continue levothyroxine 50 mcg

## 2021-06-14 NOTE — Assessment & Plan Note (Signed)
Due for follow-up CT chest without contrast. Orders placed.  Await results.

## 2021-06-20 ENCOUNTER — Telehealth: Payer: Self-pay | Admitting: Psychiatry

## 2021-06-20 ENCOUNTER — Telehealth: Payer: Self-pay | Admitting: Primary Care

## 2021-06-20 NOTE — Telephone Encounter (Signed)
Pt called stating that Carlis Abbott was supposed to put a XRAY order in and to call if she hasn't heard anything, and would like to get it done for this week since she was in town

## 2021-06-20 NOTE — Telephone Encounter (Signed)
Left message to return call to our office.  Her CT has been approved. If she has not received call to make appointment yet. She can call [phone] option 3 then 2

## 2021-06-20 NOTE — Telephone Encounter (Signed)
Put on cancellation list 

## 2021-06-20 NOTE — Telephone Encounter (Signed)
Want me to call her? I know you have dealt with several phone messages.

## 2021-06-20 NOTE — Telephone Encounter (Signed)
Pt called and feels like the Lexapro she is taking isn't working.  Pls call her back.

## 2021-06-22 ENCOUNTER — Telehealth: Payer: Self-pay | Admitting: Primary Care

## 2021-06-22 ENCOUNTER — Ambulatory Visit
Admission: RE | Admit: 2021-06-22 | Discharge: 2021-06-22 | Disposition: A | Discharge status: Home / Self Care | Payer: Medicare Other | Source: Ambulatory Visit | Attending: Primary Care | Admitting: Primary Care

## 2021-06-22 ENCOUNTER — Other Ambulatory Visit: Payer: Self-pay

## 2021-06-22 DIAGNOSIS — R911 Solitary pulmonary nodule: Secondary | ICD-10-CM | POA: Diagnosis not present

## 2021-06-22 DIAGNOSIS — I700 Atherosclerosis of aorta: Secondary | ICD-10-CM | POA: Diagnosis not present

## 2021-06-22 DIAGNOSIS — R918 Other nonspecific abnormal finding of lung field: Secondary | ICD-10-CM | POA: Diagnosis not present

## 2021-06-22 NOTE — Telephone Encounter (Signed)
Pt returning call

## 2021-06-22 NOTE — Telephone Encounter (Signed)
Left message to return call to our office.  

## 2021-06-23 ENCOUNTER — Other Ambulatory Visit: Payer: Self-pay | Admitting: Primary Care

## 2021-06-23 DIAGNOSIS — E785 Hyperlipidemia, unspecified: Secondary | ICD-10-CM

## 2021-06-23 NOTE — Telephone Encounter (Signed)
See open message

## 2021-06-27 NOTE — Telephone Encounter (Signed)
Reviewed chart CT has been completed no further action needed at this time.

## 2021-06-30 ENCOUNTER — Other Ambulatory Visit: Payer: Self-pay | Admitting: *Deleted

## 2021-06-30 ENCOUNTER — Telehealth: Payer: Self-pay

## 2021-06-30 DIAGNOSIS — E785 Hyperlipidemia, unspecified: Secondary | ICD-10-CM | POA: Diagnosis not present

## 2021-06-30 NOTE — Telephone Encounter (Signed)
[place] Night - Client Nonclinical Telephone Record AccessNurse Client [facility] Night - Client Client Site [place] Physician Alma Friendly - NP Contact Type Call Who Is Calling Patient / Member / Family / Caregiver Caller Name [place] Phone Number [phone] Patient Name Andrea [name] Patient DOB 01/16/1951 Call Type Message Only Information Provided Reason for Call Request for General Office Information Initial Comment Needs to get order for blood test, is at the lab now waiting, pls call back Additional Comment Fax number to the lab# [phone] Disp. Time Disposition Final User 06/30/2021 7:53:58 AM General Information Provided Yes Donato Heinz Call Closed By: Donato Heinz Transaction Date/Time: 06/30/2021 7:49:31 AM (ET)

## 2021-06-30 NOTE — Telephone Encounter (Signed)
I tried calling pt x 2 with no answer; t;here is an active order for labs in pts chart; sending note to [facility] CMA.

## 2021-06-30 NOTE — Telephone Encounter (Signed)
Called patient she has had taken care of no further action needed by our office.

## 2021-07-01 LAB — LIPID PANEL
Chol/HDL Ratio: 2.9 ratio (ref 0.0–4.4)
Cholesterol, Total: 172 mg/dL (ref 100–199)
HDL: 59 mg/dL (ref >39)
LDL Chol Calc (NIH): 100 mg/dL — ABNORMAL HIGH (ref 0–99)
Triglycerides: 69 mg/dL (ref 0–149)
VLDL Cholesterol Cal: 13 mg/dL (ref 5–40)

## 2021-07-11 ENCOUNTER — Ambulatory Visit: Payer: Medicare Other | Admitting: Psychiatry

## 2021-08-01 ENCOUNTER — Ambulatory Visit (INDEPENDENT_AMBULATORY_CARE_PROVIDER_SITE_OTHER): Payer: Medicare Other | Admitting: Psychiatry

## 2021-08-01 ENCOUNTER — Encounter: Payer: Self-pay | Admitting: Psychiatry

## 2021-08-01 ENCOUNTER — Other Ambulatory Visit: Payer: Self-pay

## 2021-08-01 DIAGNOSIS — F411 Generalized anxiety disorder: Secondary | ICD-10-CM | POA: Diagnosis not present

## 2021-08-01 DIAGNOSIS — F3341 Major depressive disorder, recurrent, in partial remission: Secondary | ICD-10-CM

## 2021-08-01 MED ORDER — ARIPIPRAZOLE 5 MG PO TABS: ORAL_TABLET | ORAL | 1 refills | Status: DC

## 2021-08-01 NOTE — Progress Notes (Addendum)
Andrea [name] [id number] 03-31-1951 70 y.o.  Subjective:   Patient ID:  Andrea [name] is a 70 y.o. (DOB Jul 03, 1951) female.  Chief Complaint:  Chief Complaint  Patient presents with   Follow-up   Depression   Anxiety    HPI Andrea [name] presents to the office today for follow-up of MDE and anxiety disorder.  seen 09/2019.  No meds were changed.  Been on Lexapro 20 since early 2017.  02/27/2020 phone call from patient: Pt experiencing more anxiety than she normally has. Pt would like to know what are some options that she has as far as her medication. Pt will be in an appt  MD response: If the anxiety is just occasional then using alprazolam as needed would be an okay thing to do.  If it is been fairly persistent for a couple of weeks or more than the simplest solution would be to increase the Lexapro to 1-1/2 tablets daily.  Historically she has done well on Lexapro 20 mg daily for an extended period of time.  If she is under temporary stress then once that is resolved we could drop it back to 1 daily.  If this does not work as expected then she should schedule an earlier appointment. Pt response: Patient called back and she's been having temporary stress, but it's not bad. Things are great, she's working out and feeling okay for the most part. It's been going on for a bit so she agrees to the increase Lexapro 20 mg 1.5 tablets daily. She has been taking alprazolam at hs occasionally to help sleep better and it does help.   12/06/2020 appointment with the following noted:  No covid.  H Covid and recovered.   Had increased Lexapro to 30 mg for 90 days and saw a difference but started seeing weight gain and was doing oK and able to drop back and been OK. Still renovating a house for a year.  Sold her house and mourns the house. Calmed down.  Kept 3 gkids and dogs 10 days.   [place] for new house May 1. Rare alprazolam for HS. Stress moving to condo and would have crying spell over the  move and some problems she was running into dealing with it.  Hard with change.  But taking time to adjust to the idea of moving from her house of 23 years.  Initially refused but he left it up to her and she's come to peace about it.  [name]   Good response to medication and no SE.  Exercising is good medicine.  If doesn't then doesn't feel as good. Satisfied. Plan: no med changes  08/01/21  appt noted:  seen with [name] died 08-03-23.  A lot of ups and downs per D.  Hard dips. Got worse when retired and dealing with the new  house.  Had trouble getting rid of things from the old house causing regrets over sellling the house.  D notes doesn't do well with instability  and stressors.  Ruminates on past mistakes. Blamed [name] for the  difficulty renovating the house.  Neg self talk and beats herself up. When not enough sleep will get depressed.  Does better if busy.    Dreads things.  Patient denies difficulty with sleep initiation or maintenance. Denies appetite disturbance.  Patient reports that energy and motivation have been good. Patient denies any difficulty with concentration.  Patient denies any suicidal ideation.   [name] with a little dementia.  Past Psychiatric Medication Trials: Sertraline, fluoxetine, citalopram 50, nefazodone, mirtazapine, Lexapro 30 weight gain, Viibryd 30, Wellbutrin 300,  methyl folate buspirone, alprazolam, clonazepam, Under the care of [facility] since January 2001  Sister does well on lamotrigine.  Review of Systems:  Review of Systems  Cardiovascular:  Negative for palpitations.  Neurological:  Negative for tremors and weakness.  Psychiatric/Behavioral:  Positive for decreased concentration and dysphoric mood. Negative for agitation, behavioral problems, confusion, hallucinations, self-injury, sleep disturbance and suicidal ideas. The patient is nervous/anxious. The patient is not hyperactive.    Medications: I have  reviewed the patient's current medications.  Current Outpatient Medications  Medication Sig Dispense Refill   ALPRAZolam (XANAX) 0.5 MG tablet TAKE 1 TABLET BY MOUTH AT BEDTIME AS NEEDED FOR ANXIETY. (Patient taking differently: Pt rarely takes.) 90 tablet 0   atorvastatin (LIPITOR) 20 MG tablet Take 20 mg by mouth.      calcium citrate-vitamin D (CITRACAL+D) 315-200 MG-UNIT tablet Take by mouth.     escitalopram (LEXAPRO) 20 MG tablet Pt takes 1 tab daily. 90 tablet 3   fluticasone (FLONASE) 50 MCG/ACT nasal spray fluticasone propionate 50 mcg/actuation nasal spray,suspension     levothyroxine (SYNTHROID, LEVOTHROID) 50 MCG tablet Take 50 mcg by mouth daily before breakfast.     raloxifene (EVISTA) 60 MG tablet raloxifene 60 mg tablet     XIIDRA 5 % SOLN      No current facility-administered medications for this visit.    Medication Side Effects: None  Allergies:  Allergies  Allergen Reactions   Hydrocodone-Acetaminophen Other (See Comments)    Passes out   Morphine Other (See Comments)    Passes out    Past Medical History:  Diagnosis Date   GAD (generalized anxiety disorder)    Hyperlipidemia    Hypothyroidism    Osteopenia    Sinusitis     Family History  Problem Relation Age of Onset   Arthritis Mother    COPD Mother    Hypercholesterolemia Mother    Dementia Mother    Stroke Mother    Prostate cancer Brother    Cancer Maternal Grandmother        Oral    Social History   Socioeconomic History   Marital status: Married    Spouse name: Not on file   Number of children: Not on file   Years of education: Not on file   Highest education level: Not on file  Occupational History   Not on file  Tobacco Use   Smoking status: Never   Smokeless tobacco: Never  Substance and Sexual Activity   Alcohol use: Not on file   Drug use: Not on file   Sexual activity: Not on file  Other Topics Concern   Not on file  Social History Narrative   Married.   1 child. 2  grandchildren.   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   Enjoys exercising, spending time with family   Social Determinants of Health   Financial Resource Strain: Not on file  Food Insecurity: Not on file  Transportation Needs: Not on file  Physical Activity: Not on file  Stress: Not on file  Social Connections: Not on file  Intimate Partner Violence: Not on file    Past Medical History, Surgical history, Social history, and Family history were reviewed and updated as appropriate.   3 grandsons.  Please see review of systems for further details on the patient's review from today.   Objective:  Physical Exam:  There were no vitals taken for this visit.  Physical Exam Constitutional:      General: She is not in acute distress.    Appearance: She is well-developed.  Musculoskeletal:        General: No deformity.  Neurological:     Mental Status: She is alert and oriented to person, place, and time.     Motor: No tremor.     Coordination: Coordination normal.     Gait: Gait normal.  Psychiatric:        Attention and Perception: She is attentive. She does not perceive auditory hallucinations.        Mood and Affect: Mood is anxious and depressed. Affect is not labile, blunt, angry or inappropriate.        Speech: Speech normal.        Behavior: Behavior normal.        Thought Content: Thought content normal. Thought content does not include homicidal or suicidal ideation. Thought content does not include homicidal or suicidal plan.        Cognition and Memory: Cognition and memory normal.        Judgment: Judgment normal.     Comments: Insight intact. No auditory or visual hallucinations. No delusions.     Lab Review:  No results found for: NA, K, CL, CO2, GLUCOSE, BUN, CREATININE, CALCIUM, PROT, ALBUMIN, AST, ALT, ALKPHOS, BILITOT, GFRNONAA, GFRAA  No results found for: WBC, RBC, HGB, HCT, PLT, MCV, MCH, MCHC, RDW, LYMPHSABS, MONOABS, EOSABS, BASOSABS  No results  found for: POCLITH, LITHIUM   No results found for: PHENYTOIN, PHENOBARB, VALPROATE, CBMZ   .res Assessment: Plan:    Recurrent major depression in partial remission ([place])  Generalized anxiety disorder   Greater than 50% of face to face time with patient was spent on counseling and coordination of care. We discussed her recurrent depression worse lately No Bz abuse.  Using very little.  Abilify 2.5 mg daily for a week, then 5 mg daily. Discussed potential metabolic side effects associated with atypical antipsychotics, as well as potential risk for movement side effects. Advised pt to contact office if movement side effects occur.   Continue Lexapro 20.    Rec counseling to deal with grief.    FU 1 mo  Lynder Parents, MD, DFAPA   Please see After Visit Summary for patient specific instructions.  Future Appointments  Date Time Provider [place]  09/14/2021  2:30 PM Cottle, Billey Co., MD [facility] None  12/06/2021  1:00 PM Cottle, Billey Co., MD [facility] None    No orders of the defined types were placed in this encounter.     -------------------------------

## 2021-08-06 DIAGNOSIS — Z20822 Contact with and (suspected) exposure to covid-19: Secondary | ICD-10-CM | POA: Diagnosis not present

## 2021-08-14 ENCOUNTER — Ambulatory Visit: Payer: Self-pay

## 2021-08-14 DIAGNOSIS — J209 Acute bronchitis, unspecified: Secondary | ICD-10-CM | POA: Diagnosis not present

## 2021-08-14 DIAGNOSIS — J019 Acute sinusitis, unspecified: Secondary | ICD-10-CM | POA: Diagnosis not present

## 2021-08-14 DIAGNOSIS — B9689 Other specified bacterial agents as the cause of diseases classified elsewhere: Secondary | ICD-10-CM | POA: Diagnosis not present

## 2021-08-17 ENCOUNTER — Other Ambulatory Visit: Payer: Self-pay | Admitting: Psychiatry

## 2021-08-17 DIAGNOSIS — F411 Generalized anxiety disorder: Secondary | ICD-10-CM

## 2021-08-23 ENCOUNTER — Other Ambulatory Visit: Payer: Self-pay | Admitting: Psychiatry

## 2021-08-23 DIAGNOSIS — F411 Generalized anxiety disorder: Secondary | ICD-10-CM

## 2021-08-23 DIAGNOSIS — F3341 Major depressive disorder, recurrent, in partial remission: Secondary | ICD-10-CM

## 2021-08-27 DIAGNOSIS — J101 Influenza due to other identified influenza virus with other respiratory manifestations: Secondary | ICD-10-CM | POA: Diagnosis not present

## 2021-08-27 DIAGNOSIS — Z20822 Contact with and (suspected) exposure to covid-19: Secondary | ICD-10-CM | POA: Diagnosis not present

## 2021-08-31 ENCOUNTER — Other Ambulatory Visit: Payer: Self-pay

## 2021-08-31 ENCOUNTER — Ambulatory Visit (INDEPENDENT_AMBULATORY_CARE_PROVIDER_SITE_OTHER): Payer: Medicare Other | Admitting: Psychiatry

## 2021-08-31 ENCOUNTER — Encounter: Payer: Self-pay | Admitting: Psychiatry

## 2021-08-31 DIAGNOSIS — F3341 Major depressive disorder, recurrent, in partial remission: Secondary | ICD-10-CM | POA: Diagnosis not present

## 2021-08-31 DIAGNOSIS — F5105 Insomnia due to other mental disorder: Secondary | ICD-10-CM

## 2021-08-31 DIAGNOSIS — F411 Generalized anxiety disorder: Secondary | ICD-10-CM

## 2021-08-31 MED ORDER — ALPRAZOLAM 0.5 MG PO TABS: 0.5000 mg | ORAL_TABLET | Freq: Every evening | ORAL | 1 refills | Status: DC | PRN

## 2021-08-31 MED ORDER — ARIPIPRAZOLE 2 MG PO TABS: 2.0000 mg | ORAL_TABLET | Freq: Every day | ORAL | 0 refills | Status: DC

## 2021-08-31 NOTE — Progress Notes (Signed)
Andrea [name] [id number] [birth year] 70 y.o.  Subjective:   Patient ID:  Andrea [name] is a 70 y.o. (DOB 1951/06/11) female.  Chief Complaint:  Chief Complaint  Patient presents with   Follow-up   Depression   Sleeping Problem   Medication Problem    HPI Andrea [name] presents to the office today for follow-up of MDE and anxiety disorder.  seen 09/2019.  No meds were changed.  Been on Lexapro 20 since early 2017.  02/27/2020 phone call from patient: Pt experiencing more anxiety than she normally has. Pt would like to know what are some options that she has as far as her medication. Pt will be in an appt  MD response: If the anxiety is just occasional then using alprazolam as needed would be an okay thing to do.  If it is been fairly persistent for a couple of weeks or more than the simplest solution would be to increase the Lexapro to 1-1/2 tablets daily.  Historically she has done well on Lexapro 20 mg daily for an extended period of time.  If she is under temporary stress then once that is resolved we could drop it back to 1 daily.  If this does not work as expected then she should schedule an earlier appointment. Pt response: Patient called back and she's been having temporary stress, but it's not bad. Things are great, she's working out and feeling okay for the most part. It's been going on for a bit so she agrees to the increase Lexapro 20 mg 1.5 tablets daily. She has been taking alprazolam at hs occasionally to help sleep better and it does help.   12/06/2020 appointment with the following noted:  No covid.  H Covid and recovered.   Had increased Lexapro to 30 mg for 90 days and saw a difference but started seeing weight gain and was doing oK and able to drop back and been OK. Still renovating a house for a year.  Sold her house and mourns the house. Calmed down.  Kept 3 gkids and dogs 10 days.   [place] for new house May 1. Rare alprazolam for HS. Stress moving to condo and would  have crying spell over the move and some problems she was running into dealing with it.  Hard with change.  But taking time to adjust to the idea of moving from her house of 23 years.  Initially refused but he left it up to her and she's come to peace about it.  [name]   Good response to medication and no SE.  Exercising is good medicine.  If doesn't then doesn't feel as good. Satisfied. Plan: no med changes  08/01/21  appt noted:  seen with [name] died 08/17/2023.  A lot of ups and downs per D.  Hard dips. Got worse when retired and dealing with the new  house.  Had trouble getting rid of things from the old house causing regrets over sellling the house.  D notes doesn't do well with instability  and stressors.  Ruminates on past mistakes. Blamed [name] for the  difficulty renovating the house.  Neg self talk and beats herself up. When not enough sleep will get depressed.  Does better if busy.   Dreads things.  Patient denies difficulty with sleep initiation or maintenance. Denies appetite disturbance.  Patient reports that energy and motivation have been good. Patient denies any difficulty with concentration.  Patient denies any suicidal ideation.  Plan: Abilify 2.5  mg daily for a week, then 5 mg daily. Continue Lexapro 20 mg daily  08/31/2021 appt noted: It worked immediately.  Insomnia with EMA. Normally 9 hours.  It's like I'm high.  Not tired.  Average 3-4 hours. Not depressed and no crazy negative thoughts.  Good response.  Energetic but up in middle of night.   Sister has had cycling depression that has responded well to lamotrigine with poor response to SSRIs [name] with a little dementia.  Past Psychiatric Medication Trials: Sertraline, fluoxetine, citalopram 50, nefazodone, mirtazapine, Lexapro 30 weight gain, Viibryd 30, Wellbutrin 300,  methyl folate buspirone, alprazolam, clonazepam, Under the care of [facility] since January 2001  Sister does well on  lamotrigine.  Review of Systems:  Review of Systems  Cardiovascular:  Negative for palpitations.  Neurological:  Negative for tremors and weakness.  Psychiatric/Behavioral:  Positive for decreased concentration and dysphoric mood. Negative for agitation, behavioral problems, confusion, hallucinations, self-injury, sleep disturbance and suicidal ideas. The patient is nervous/anxious. The patient is not hyperactive.    Medications: I have reviewed the patient's current medications.  Current Outpatient Medications  Medication Sig Dispense Refill   ARIPiprazole (ABILIFY) 2 MG tablet Take 1 tablet (2 mg total) by mouth daily. 30 tablet 0   atorvastatin (LIPITOR) 20 MG tablet Take 20 mg by mouth.      calcium citrate-vitamin D (CITRACAL+D) 315-200 MG-UNIT tablet Take by mouth.     escitalopram (LEXAPRO) 20 MG tablet Pt takes 1 tab daily. 90 tablet 3   fluticasone (FLONASE) 50 MCG/ACT nasal spray fluticasone propionate 50 mcg/actuation nasal spray,suspension     levothyroxine (SYNTHROID, LEVOTHROID) 50 MCG tablet Take 50 mcg by mouth daily before breakfast.     raloxifene (EVISTA) 60 MG tablet raloxifene 60 mg tablet     XIIDRA 5 % SOLN      ALPRAZolam (XANAX) 0.5 MG tablet Take 1 tablet (0.5 mg total) by mouth at bedtime as needed for anxiety. (Patient not taking: Reported on 08/31/2021) 30 tablet 1   No current facility-administered medications for this visit.    Medication Side Effects: None  Allergies:  Allergies  Allergen Reactions   Hydrocodone-Acetaminophen Other (See Comments)    Passes out   Morphine Other (See Comments)    Passes out    Past Medical History:  Diagnosis Date   GAD (generalized anxiety disorder)    Hyperlipidemia    Hypothyroidism    Osteopenia    Sinusitis     Family History  Problem Relation Age of Onset   Arthritis Mother    COPD Mother    Hypercholesterolemia Mother    Dementia Mother    Stroke Mother    Prostate cancer Brother    Cancer  Maternal Grandmother        Oral    Social History   Socioeconomic History   Marital status: Married    Spouse name: Not on file   Number of children: Not on file   Years of education: Not on file   Highest education level: Not on file  Occupational History   Not on file  Tobacco Use   Smoking status: Never   Smokeless tobacco: Never  Substance and Sexual Activity   Alcohol use: Not on file   Drug use: Not on file   Sexual activity: Not on file  Other Topics Concern   Not on file  Social History Narrative   Married.   1 child. 2 grandchildren.   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   Enjoys exercising, spending time with family   Social Determinants of Health   Financial Resource Strain: Not on file  Food Insecurity: Not on file  Transportation Needs: Not on file  Physical Activity: Not on file  Stress: Not on file  Social Connections: Not on file  Intimate Partner Violence: Not on file    Past Medical History, Surgical history, Social history, and Family history were reviewed and updated as appropriate.   3 grandsons.  Please see review of systems for further details on the patient's review from today.   Objective:   Physical Exam:  There were no vitals taken for this visit.  Physical Exam Constitutional:      General: She is not in acute distress.    Appearance: She is well-developed.  Musculoskeletal:        General: No deformity.  Neurological:     Mental Status: She is alert and oriented to person, place, and time.     Motor: No tremor.     Coordination: Coordination normal.     Gait: Gait normal.  Psychiatric:        Attention and Perception: She is attentive. She does not perceive auditory hallucinations.        Mood and Affect: Mood is anxious and depressed. Affect is not labile, blunt, angry or inappropriate.        Speech: Speech normal.        Behavior: Behavior normal.        Thought Content: Thought content normal. Thought content  does not include homicidal or suicidal ideation. Thought content does not include homicidal or suicidal plan.        Cognition and Memory: Cognition and memory normal.        Judgment: Judgment normal.     Comments: Insight intact. No auditory or visual hallucinations. No delusions.     Lab Review:  No results found for: NA, K, CL, CO2, GLUCOSE, BUN, CREATININE, CALCIUM, PROT, ALBUMIN, AST, ALT, ALKPHOS, BILITOT, GFRNONAA, GFRAA  No results found for: WBC, RBC, HGB, HCT, PLT, MCV, MCH, MCHC, RDW, LYMPHSABS, MONOABS, EOSABS, BASOSABS  No results found for: POCLITH, LITHIUM   No results found for: PHENYTOIN, PHENOBARB, VALPROATE, CBMZ   .res Assessment: Plan:    Recurrent major depression in partial remission ([place]) - Plan: ARIPiprazole (ABILIFY) 2 MG tablet  Generalized anxiety disorder  Insomnia due to mental condition - Plan: ALPRAZolam (XANAX) 0.5 MG tablet   Greater than 50% of 30-minute face to face time with patient was spent on counseling and coordination of care.  Recent worsening mood as noted before.  Abilify 5 was added. Sister has had cycling depression that has responded well to lamotrigine with poor response to SSRIs Her depression responded very well to Abilify with complete remission within a few days.  The anhedonia irritability and negative thoughts resolved completely.  Her interest in things and social interest in particular improved dramatically.  However she had significant insomnia as a side effect.  We discussed the pros and cons of stopping it versus reducing it.  She wants to be on the least amount of medicine possible.  We discussed the concept of tachyphylaxis with SSRIs.  We discussed the possibility of switching to an alternative SSRI if she has recurrent depressive symptoms off of Abilify.  She wants to try to stop it.  Okay to stop Abilify because of insomnia.  If the depression recurs she can resume Abilify 2 mg daily  or every other day to try to avoid  the insomnia where she can contact our office and we can discussed the possibility of an alternative antidepressant such as Trintellix or duloxetine.  Recommend she take Xanax 0.5 mg nightly until the insomnia resolves and then taper off the Xanax.  Continue Lexapro 20.    Rec counseling to deal with grief.    FU 2 to 3 months depending on how she does without Abilify.  Lynder Parents, MD, DFAPA   Please see After Visit Summary for patient specific instructions.  Future Appointments  Date Time Provider [place]  12/06/2021  1:00 PM Cottle, Billey Co., MD [facility] None    No orders of the defined types were placed in this encounter.     -------------------------------

## 2021-09-14 ENCOUNTER — Ambulatory Visit: Payer: Medicare Other | Admitting: Psychiatry

## 2021-09-16 ENCOUNTER — Other Ambulatory Visit: Payer: Self-pay | Admitting: Psychiatry

## 2021-09-16 DIAGNOSIS — F3341 Major depressive disorder, recurrent, in partial remission: Secondary | ICD-10-CM

## 2021-09-17 DIAGNOSIS — N390 Urinary tract infection, site not specified: Secondary | ICD-10-CM | POA: Diagnosis not present

## 2021-09-21 NOTE — Telephone Encounter (Signed)
Please review note from [place]

## 2021-09-21 NOTE — Telephone Encounter (Signed)
Please check if pt still taking? Pt was to discontinue but restart if depression worsened

## 2021-09-21 NOTE — Telephone Encounter (Signed)
Called patient to see if she was still taking. She said she is not. She said it kept her up all night for days and that she does not see herself ever taking it again. She states all she is taking is the 20 mg Lexapro and is doing very well on that.

## 2021-09-29 ENCOUNTER — Encounter: Payer: Self-pay | Admitting: Psychiatry

## 2021-09-29 ENCOUNTER — Ambulatory Visit (INDEPENDENT_AMBULATORY_CARE_PROVIDER_SITE_OTHER): Payer: Medicare Other | Admitting: Psychiatry

## 2021-09-29 ENCOUNTER — Other Ambulatory Visit: Payer: Self-pay

## 2021-09-29 DIAGNOSIS — F5105 Insomnia due to other mental disorder: Secondary | ICD-10-CM | POA: Diagnosis not present

## 2021-09-29 DIAGNOSIS — F411 Generalized anxiety disorder: Secondary | ICD-10-CM

## 2021-09-29 DIAGNOSIS — F3341 Major depressive disorder, recurrent, in partial remission: Secondary | ICD-10-CM | POA: Diagnosis not present

## 2021-09-29 NOTE — Progress Notes (Signed)
Andrea [name] [id number] October 30, 1950 71 y.o.  Subjective:   Patient ID:  Andrea [name] is a 71 y.o. (DOB [birth year]) female.  Chief Complaint:  Chief Complaint  Patient presents with   Follow-up   Depression    HPI Andrea [name] presents to the office today for follow-up of MDE and anxiety disorder.  seen 09/2019.  No meds were changed.  Been on Lexapro 20 since early 2017.  02/27/2020 phone call from patient: Pt experiencing more anxiety than she normally has. Pt would like to know what are some options that she has as far as her medication. Pt will be in an appt  MD response: If the anxiety is just occasional then using alprazolam as needed would be an okay thing to do.  If it is been fairly persistent for a couple of weeks or more than the simplest solution would be to increase the Lexapro to 1-1/2 tablets daily.  Historically she has done well on Lexapro 20 mg daily for an extended period of time.  If she is under temporary stress then once that is resolved we could drop it back to 1 daily.  If this does not work as expected then she should schedule an earlier appointment. Pt response: Patient called back and she's been having temporary stress, but it's not bad. Things are great, she's working out and feeling okay for the most part. It's been going on for a bit so she agrees to the increase Lexapro 20 mg 1.5 tablets daily. She has been taking alprazolam at hs occasionally to help sleep better and it does help.   12/06/2020 appointment with the following noted:  No covid.  H Covid and recovered.   Had increased Lexapro to 30 mg for 90 days and saw a difference but started seeing weight gain and was doing oK and able to drop back and been OK. Still renovating a house for a year.  Sold her house and mourns the house. Calmed down.  Kept 3 gkids and dogs 10 days.   [place] for new house May 1. Rare alprazolam for HS. Stress moving to condo and would have crying spell over the move and some  problems she was running into dealing with it.  Hard with change.  But taking time to adjust to the idea of moving from her house of 23 years.  Initially refused but he left it up to her and she's come to peace about it.  [name]   Good response to medication and no SE.  Exercising is good medicine.  If doesn't then doesn't feel as good. Satisfied. Plan: no med changes  08/01/21  appt noted:  seen with [name] died 2023/08/01.  A lot of ups and downs per D.  Hard dips. Got worse when retired and dealing with the new  house.  Had trouble getting rid of things from the old house causing regrets over sellling the house.  D notes doesn't do well with instability  and stressors.  Ruminates on past mistakes. Blamed [name] for the  difficulty renovating the house.  Neg self talk and beats herself up. When not enough sleep will get depressed.  Does better if busy.   Dreads things.  Patient denies difficulty with sleep initiation or maintenance. Denies appetite disturbance.  Patient reports that energy and motivation have been good. Patient denies any difficulty with concentration.  Patient denies any suicidal ideation.  Plan: Abilify 2.5 mg daily for a week, then 5 mg  daily. Continue Lexapro 20 mg daily  08/31/2021 appt noted: It worked immediately.  Insomnia with EMA. Normally 9 hours.  It's like I'm high.  Not tired.  Average 3-4 hours. Not depressed and no crazy negative thoughts.  Good response.  Energetic but up in middle of night. Plan: Okay to stop Abilify because of insomnia.   09/29/2021 appointment with the following noted: Parties were great.  Sleep problem resolved.  [place] the [place]. Maybe the last week or 2 less motivation and socialization.  Not laying in the bed.  Just not as good as she was.  Not as outgoing as normal. No alcohol now.  Only had 1-2 at night.  Not ruminating.   Willing to restart Abilify at lower dose 2 mg daily. 3 grandsons   Sister has had cycling  depression that has responded well to lamotrigine with poor response to SSRIs [name] with a little dementia.  Past Psychiatric Medication Trials: Sertraline, fluoxetine, citalopram 50, nefazodone, mirtazapine, Lexapro 30 weight gain, Viibryd 30, Wellbutrin 300,  methyl folate buspirone, alprazolam, clonazepam, Under the care of [facility] since January 2001  Sister does well on lamotrigine.  Review of Systems:  Review of Systems  Cardiovascular:  Negative for palpitations.  Neurological:  Negative for tremors and weakness.  Psychiatric/Behavioral:  Positive for decreased concentration and dysphoric mood. Negative for agitation, behavioral problems, confusion, hallucinations, self-injury, sleep disturbance and suicidal ideas. The patient is nervous/anxious. The patient is not hyperactive.    Medications: I have reviewed the patient's current medications.  Current Outpatient Medications  Medication Sig Dispense Refill   ALPRAZolam (XANAX) 0.5 MG tablet Take 1 tablet (0.5 mg total) by mouth at bedtime as needed for anxiety. 30 tablet 1   atorvastatin (LIPITOR) 20 MG tablet Take 20 mg by mouth.      calcium citrate-vitamin D (CITRACAL+D) 315-200 MG-UNIT tablet Take by mouth.     escitalopram (LEXAPRO) 20 MG tablet Pt takes 1 tab daily. 90 tablet 3   fluticasone (FLONASE) 50 MCG/ACT nasal spray fluticasone propionate 50 mcg/actuation nasal spray,suspension     levothyroxine (SYNTHROID, LEVOTHROID) 50 MCG tablet Take 50 mcg by mouth daily before breakfast.     raloxifene (EVISTA) 60 MG tablet raloxifene 60 mg tablet     XIIDRA 5 % SOLN      ARIPiprazole (ABILIFY) 2 MG tablet Take 1 tablet (2 mg total) by mouth daily. (Patient not taking: Reported on 09/29/2021) 30 tablet 0   No current facility-administered medications for this visit.    Medication Side Effects: None  Allergies:  Allergies  Allergen Reactions   Hydrocodone-Acetaminophen Other (See Comments)    Passes  out   Morphine Other (See Comments)    Passes out    Past Medical History:  Diagnosis Date   GAD (generalized anxiety disorder)    Hyperlipidemia    Hypothyroidism    Osteopenia    Sinusitis     Family History  Problem Relation Age of Onset   Arthritis Mother    COPD Mother    Hypercholesterolemia Mother    Dementia Mother    Stroke Mother    Prostate cancer Brother    Cancer Maternal Grandmother        Oral    Social History   Socioeconomic History   Marital status: Married    Spouse name: Not on file   Number of children: Not on file   Years of education: Not on file   Highest  education level: Not on file  Occupational History   Not on file  Tobacco Use   Smoking status: Never   Smokeless tobacco: Never  Substance and Sexual Activity   Alcohol use: Not on file   Drug use: Not on file   Sexual activity: Not on file  Other Topics Concern   Not on file  Social History Narrative   Married.   1 child. 2 grandchildren.   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   Enjoys exercising, spending time with family   Social Determinants of Health   Financial Resource Strain: Not on file  Food Insecurity: Not on file  Transportation Needs: Not on file  Physical Activity: Not on file  Stress: Not on file  Social Connections: Not on file  Intimate Partner Violence: Not on file    Past Medical History, Surgical history, Social history, and Family history were reviewed and updated as appropriate.   3 grandsons.  Please see review of systems for further details on the patient's review from today.   Objective:   Physical Exam:  There were no vitals taken for this visit.  Physical Exam Constitutional:      General: She is not in acute distress.    Appearance: She is well-developed.  Musculoskeletal:        General: No deformity.  Neurological:     Mental Status: She is alert and oriented to person, place, and time.     Motor: No tremor.     Coordination:  Coordination normal.     Gait: Gait normal.  Psychiatric:        Attention and Perception: She is attentive. She does not perceive auditory hallucinations.        Mood and Affect: Mood is anxious and depressed. Affect is not labile, blunt, angry or inappropriate.        Speech: Speech normal.        Behavior: Behavior normal.        Thought Content: Thought content normal. Thought content does not include homicidal or suicidal ideation. Thought content does not include homicidal or suicidal plan.        Cognition and Memory: Cognition and memory normal.        Judgment: Judgment normal.     Comments: Insight intact. No auditory or visual hallucinations. No delusions.     Lab Review:  No results found for: NA, K, CL, CO2, GLUCOSE, BUN, CREATININE, CALCIUM, PROT, ALBUMIN, AST, ALT, ALKPHOS, BILITOT, GFRNONAA, GFRAA  No results found for: WBC, RBC, HGB, HCT, PLT, MCV, MCH, MCHC, RDW, LYMPHSABS, MONOABS, EOSABS, BASOSABS  No results found for: POCLITH, LITHIUM   No results found for: PHENYTOIN, PHENOBARB, VALPROATE, CBMZ   .res Assessment: Plan:    Recurrent major depression in partial remission ([place])  Generalized anxiety disorder  Insomnia due to mental condition   Greater than 50% of 30-minute face to face time with patient was spent on counseling and coordination of care.  Recent worsening mood as noted before.  Abilify 5 was added. Sister has had cycling depression that has responded well to lamotrigine with poor response to SSRIs Her depression responded very well to Abilify with complete remission within a few days.  The anhedonia irritability and negative thoughts resolved completely.  Her interest in things and social interest in particular improved dramatically.  However she had significant insomnia as a side effect.  We discussed the pros and cons of stopping it versus reducing it.  She wants to  be on the least amount of medicine possible.  We discussed the concept of  tachyphylaxis with SSRIs.  We discussed the possibility of switching to an alternative SSRI if she has recurrent depressive symptoms off of Abilify.  She wants to try to stop it.  Relapse depression recurs she can resume Abilify  but lower dose 2 mg daily or every other day to try to avoid the insomnia where she can contact our office and we can discussed the possibility of an alternative antidepressant such as Trintellix or duloxetine.  No Xanax needed.  Continue Lexapro 20.    Rec counseling to deal with grief.    FU 2 mos  Lynder Parents, MD, DFAPA   Please see After Visit Summary for patient specific instructions.  Future Appointments  Date Time Provider [place]  12/06/2021  1:00 PM Cottle, Billey Co., MD [facility] None    No orders of the defined types were placed in this encounter.      -------------------------------

## 2021-10-08 DIAGNOSIS — M545 Low back pain, unspecified: Secondary | ICD-10-CM | POA: Diagnosis not present

## 2021-10-10 ENCOUNTER — Telehealth: Payer: Self-pay

## 2021-10-10 NOTE — Telephone Encounter (Signed)
Prior authorization submitted and approved for ALPRAZOLAM 0.5 MG effective 09/10/2021-10/10/2022, PA# [id number] with [facility]

## 2021-10-26 ENCOUNTER — Other Ambulatory Visit: Payer: Self-pay | Admitting: Psychiatry

## 2021-10-26 ENCOUNTER — Telehealth: Payer: Self-pay | Admitting: Psychiatry

## 2021-10-26 DIAGNOSIS — F3341 Major depressive disorder, recurrent, in partial remission: Secondary | ICD-10-CM

## 2021-10-26 NOTE — Telephone Encounter (Signed)
Rx sent 

## 2021-10-26 NOTE — Telephone Encounter (Signed)
Next visit is 12/06/21. Requesting Abilify refill. She is totally out at this time. Please call to:  [id number] [name], [place]  Phone:  [phone]  Fax:  [phone]

## 2021-11-02 ENCOUNTER — Other Ambulatory Visit: Payer: Self-pay | Admitting: Psychiatry

## 2021-11-02 MED ORDER — DULOXETINE HCL 30 MG PO CPEP: ORAL_CAPSULE | ORAL | 0 refills | Status: DC

## 2021-11-02 NOTE — Telephone Encounter (Signed)
Yes we discussed the potential switch of medicine to duloxetine.  It should be a fairly easy switch.  The instructions are as follows: Stop Abilify now. Add duloxetine 30 mg capsule 1 daily for 1 week and then increase to 2 daily of the 30 mg capsules for a total of 60 mg. When she gets the refill it will be one of the 60 mg capsules after she runs out of the 30 mg capsules. As soon as she starts duloxetine the Lexapro tablet in half for 1 week and then stop it.  If she has any problems with the switch let us know.  There should be less side effects with duloxetine than Lexapro and specifically less weight gain risk.  RX sent for duloxetine 30 mg

## 2021-11-02 NOTE — Telephone Encounter (Signed)
Patient called in stating that at last appt with [facility] they discussed changing her to a different medicine and that she could just take one instead of taking Lexapro and Abilify which she states  are causing her to gain weight. She would like to know when she can start taking a her new medication. Pls call to discuss [phone]

## 2021-11-02 NOTE — Telephone Encounter (Signed)
Pt informed

## 2021-11-02 NOTE — Telephone Encounter (Signed)
Please review

## 2021-11-09 ENCOUNTER — Other Ambulatory Visit: Payer: Self-pay | Admitting: Psychiatry

## 2021-11-12 ENCOUNTER — Other Ambulatory Visit: Payer: Self-pay | Admitting: Psychiatry

## 2021-11-16 ENCOUNTER — Other Ambulatory Visit: Payer: Self-pay | Admitting: Psychiatry

## 2021-11-17 ENCOUNTER — Other Ambulatory Visit: Payer: Self-pay | Admitting: Obstetrics and Gynecology

## 2021-11-17 ENCOUNTER — Other Ambulatory Visit: Payer: Self-pay | Admitting: Psychiatry

## 2021-11-17 DIAGNOSIS — Z1231 Encounter for screening mammogram for malignant neoplasm of breast: Secondary | ICD-10-CM

## 2021-12-01 ENCOUNTER — Telehealth: Payer: Self-pay | Admitting: Psychiatry

## 2021-12-01 NOTE — Telephone Encounter (Signed)
Pt called 10:58 reporting the Cymbalta 60 mg is not doing the job. Very anxious. Not herself. Apt 4/5. [facility] [name]. Contact # [phone] ?

## 2021-12-01 NOTE — Telephone Encounter (Signed)
Pt has been taking 20 mg BID for about a month.She said it's helping very little.She is depressed and does not want to go anywhere,and wants her husband home all the time even though she is normally independent.She wants to know if dose should be increased or med changed

## 2021-12-01 NOTE — Telephone Encounter (Signed)
She does not have duloxetine 20 mg capsules.  She has the 30 mg capsules and is apparently taking 2 daily.  It is not helping her depression. ?Please make sure she is tolerating it okay. ?If so increase duloxetine to 3 daily of the 30 mg capsules ?

## 2021-12-02 ENCOUNTER — Other Ambulatory Visit: Payer: Self-pay

## 2021-12-02 MED ORDER — DULOXETINE HCL 30 MG PO CPEP: 90.0000 mg | ORAL_CAPSULE | Freq: Every day | ORAL | 0 refills | Status: DC

## 2021-12-02 NOTE — Telephone Encounter (Signed)
Pt informed

## 2021-12-06 ENCOUNTER — Ambulatory Visit: Payer: Medicare Other | Admitting: Psychiatry

## 2021-12-08 ENCOUNTER — Other Ambulatory Visit: Payer: Self-pay | Admitting: Psychiatry

## 2021-12-08 ENCOUNTER — Telehealth: Payer: Self-pay | Admitting: Psychiatry

## 2021-12-08 MED ORDER — LAMOTRIGINE 25 MG PO TABS: ORAL_TABLET | ORAL | 0 refills | Status: DC

## 2021-12-08 NOTE — Telephone Encounter (Signed)
She would like to try it.[facility] send to [facility] in [place] I have added to her chart

## 2021-12-08 NOTE — Telephone Encounter (Signed)
Pt stated you increased her Cymbalta about 3 weeks ago and she does not see a difference.She is very sad and depressed still and this is not her normal self.She thinks she needs to change meds

## 2021-12-08 NOTE — Telephone Encounter (Signed)
Spoke with patient at 11:49 regarding an issue with her Cymbalta. Andrea [name] states that CC upped her dosage from '60mg'$  to '90mg'$ . States that since she has just been sad and wants to know if she's on the right medication as this is not her usual personality. She is trying to find motivation to do anything. Pls rtc [phone] ?

## 2021-12-08 NOTE — Telephone Encounter (Signed)
I want her to continue the duloxetine 90 mg until her appointment with me because she may still get some benefit that she has not seen yet.  However we will make a change that could help her. ?There is a very mild medicine called lamotrigine that has no weight gain risk no sleepiness risk.  The only side effect risk is a 1 and 5000 and risk of a severe rash but that is obviously not very common.  I would suggest that we pursue this medication.  If it works we can possibly eliminate the duloxetine but should not stop the duloxetine until it works.  I see her in a couple of weeks.  If she agrees I will send in the prescription.  Please let me know ?

## 2021-12-08 NOTE — Telephone Encounter (Signed)
Sent Rx lamotrigine ?

## 2021-12-08 NOTE — Progress Notes (Signed)
Patient's sister has recurrent depression nonresponsive to antidepressants and poor tolerance of antidepressants but that has been responsive to lamotrigine.  The patient has had several antidepressants and lost benefit. ?

## 2021-12-16 ENCOUNTER — Telehealth: Payer: Self-pay | Admitting: Psychiatry

## 2021-12-16 NOTE — Telephone Encounter (Signed)
She has not met on the lamotrigine long enough to get benefit.  We have to go up in the dosage slowly because there is a risk of serious rash if we go up too quickly. ? ?Changing the antidepressant will not work quickly either. ? ?I would recommend she come and get some samples of Rexulti 1 mg tablets.  That has the potential to help within a week.  I know she lives in [place] and it is not easy to get here but she will not be able to get the medicine if ascended into the pharmacy because we will have to do a PA.  We will probably have to fight with the insurance company to get it.  It would be best if she came and tried samples first. ?

## 2021-12-16 NOTE — Telephone Encounter (Signed)
Patient returned call. [place] the beach and may be there for another week so will not be able to get samples. She will call when she gets back and some pick up samples.  ?

## 2021-12-16 NOTE — Telephone Encounter (Signed)
Called patient again. Did not get an answer, but did not leave another VM.  ?

## 2021-12-16 NOTE — Telephone Encounter (Signed)
LVM to RC 

## 2021-12-16 NOTE — Telephone Encounter (Signed)
Patient said she is still depressed, sad, no motivation, and is isolating. She is sleeping well, but sometimes uses Xanax if needed.  She said the only possible SE she has seen is dry mouth and said that she may have had some dry mouth prior. She knows she hasn't been on the Lamictal long, but said she needs to get some relief, that she needs to get back to herself.  ?

## 2021-12-16 NOTE — Telephone Encounter (Signed)
Andrea [name] called this morning at 10:00am to report that her medications are not working.  You added a new medication and increased her cymbalta but she not seeing any difference and getting no relief.  Please call to discuss.  Next appt 12/28/21 ?

## 2021-12-21 ENCOUNTER — Telehealth: Payer: Self-pay | Admitting: Psychiatry

## 2021-12-21 NOTE — Telephone Encounter (Signed)
Patient notified that samples would be pulled, that Rexulti has the potential to show benefit after a week, but Lamictal will take longer.  ?

## 2021-12-21 NOTE — Telephone Encounter (Signed)
I have started 2 things to try to help her with the depression.  One is potentially quick acting, the Rexulti and the other takes several weeks which is the lamotrigine.  We will decide which one works and get rid of the other 1. ?

## 2021-12-21 NOTE — Telephone Encounter (Signed)
Patient will pick up 3/30.  ?

## 2021-12-21 NOTE — Telephone Encounter (Signed)
Andrea [name] requesting to pick up samples of Rexulti. She stated she was told to call office for availability before she came in. Contact information is # [phone]. ?

## 2021-12-27 ENCOUNTER — Other Ambulatory Visit: Payer: Self-pay | Admitting: Psychiatry

## 2021-12-28 ENCOUNTER — Encounter: Payer: Self-pay | Admitting: Psychiatry

## 2021-12-28 ENCOUNTER — Ambulatory Visit (INDEPENDENT_AMBULATORY_CARE_PROVIDER_SITE_OTHER): Payer: Medicare Other | Admitting: Psychiatry

## 2021-12-28 DIAGNOSIS — F411 Generalized anxiety disorder: Secondary | ICD-10-CM

## 2021-12-28 DIAGNOSIS — F5105 Insomnia due to other mental disorder: Secondary | ICD-10-CM

## 2021-12-28 DIAGNOSIS — F332 Major depressive disorder, recurrent severe without psychotic features: Secondary | ICD-10-CM | POA: Diagnosis not present

## 2021-12-28 NOTE — Patient Instructions (Addendum)
Continue lamotrigine as prescribed ?Increase Rexulti to 1 mg daily ? ?Switch to Trintellix:  reduce duloxetine to 2 of the 30 mg capsules and start Trintellix 5 mg daily for 5 days,  ?Then Increase Trintellix to 10 mg daily and reduce duloxetine to 1 capsule for 1 week. ?Then stop duloxetine. ? ?[place] ?[facility]  ?

## 2021-12-28 NOTE — Progress Notes (Signed)
Andrea [name] ?[id number] ?1950/10/28 ?71 y.o. ? ?Subjective:  ? ?Patient ID:  Andrea [name] is a 71 y.o. (DOB 08/18/1951) female. ? ?Chief Complaint:  ?Chief Complaint  ?Patient presents with  ? Follow-up  ? Depression  ? Anxiety  ? ? ?HPI ?Andrea [name] presents to the office today for follow-up of MDE and anxiety disorder. ? ?seen 09/2019.  No meds were changed.  Been on Lexapro 20 since early 2017. ? ?02/27/2020 phone call from patient: Pt experiencing more anxiety than she normally has. Pt would like to know what are some options that she has as far as her medication. Pt will be in an appt  ?MD response: If the anxiety is just occasional then using alprazolam as needed would be an okay thing to do.  If it is been fairly persistent for a couple of weeks or more than the simplest solution would be to increase the Lexapro to 1-1/2 tablets daily.  Historically she has done well on Lexapro 20 mg daily for an extended period of time.  If she is under temporary stress then once that is resolved we could drop it back to 1 daily.  If this does not work as expected then she should schedule an earlier appointment. ?Pt response: Patient called back and she's been having temporary stress, but it's not bad. Things are great, she's working out and feeling okay for the most part. It's been going on for a bit so she agrees to the increase Lexapro 20 mg 1.5 tablets daily. She has been taking alprazolam at hs occasionally to help sleep better and it does help.  ? ?12/06/2020 appointment with the following noted:  ?No covid.  H Covid and recovered.   ?Had increased Lexapro to 30 mg for 90 days and saw a difference but started seeing weight gain and was doing oK and able to drop back and been OK. ?Still renovating a house for a year.  Sold her house and mourns the house. ?Calmed down.  Kept 3 gkids and dogs 10 days.   ?[name] ?Hopes for new house May 1. ?Rare alprazolam for HS. ?Stress moving to condo and would have crying spell over the  move and some problems she was running into dealing with it.  Hard with change.  But taking time to adjust to the idea of moving from her house of 23 years.  Initially refused but he left it up to her and she's come to peace about it.  [name]   ?Good response to medication and no SE.  Exercising is good medicine.  If doesn't then doesn't feel as good. Satisfied. ?Plan: no med changes ? ?08/01/21  appt noted:  seen with D Chelsea ?Mo died August 02, 2023.  ?A lot of ups and downs per D.  Hard dips. Got worse when retired and dealing with the new  house.  Had trouble getting rid of things from the old house causing regrets over sellling the house.  D notes doesn't do well with instability  and stressors.  Ruminates on past mistakes. ?Blamed [name] for the  difficulty renovating the house.  Neg self talk and beats herself up. ?When not enough sleep will get depressed.  Does better if busy. ?  Dreads things.  Patient denies difficulty with sleep initiation or maintenance. Denies appetite disturbance.  Patient reports that energy and motivation have been good. Patient denies any difficulty with concentration.  Patient denies any suicidal ideation.  ?Plan: Abilify 2.5 mg daily for a week,  then 5 mg daily. ?Continue Lexapro 20 mg daily ? ?08/31/2021 appt noted: ?It worked immediately.  Insomnia with EMA. Normally 9 hours.  It's like I'm high.  Not tired.  Average 3-4 hours. ?Not depressed and no crazy negative thoughts.  Good response.  Energetic but up in middle of night. ?Plan: Okay to stop Abilify because of insomnia.  ? ?09/29/2021 appointment with the following noted: ?Parties were great.  Sleep problem resolved.  ?A lot of rest at the beach. ?Maybe the last week or 2 less motivation and socialization.  Not laying in the bed.  Just not as good as she was.  Not as outgoing as normal. ?No alcohol now.  Only had 1-2 at night.  Not ruminating.   ?Willing to restart Abilify at lower dose 2 mg daily. ?3 grandsons ? Plan: Relapse  depression recurs she can resume Abilify  but lower dose 2 mg daily or every other day to try to avoid the insomnia where she can contact our office and we can discussed the possibility of an alternative antidepressant such as Trintellix or duloxetine. ? ?12/28/2021 appointment noted: ?Several phone calls since she was here.  Abilify plus Lexapro failed.  Switch to duloxetine 90 mg which she started 12/02/2018 2023 ?Started lamotrigine. ?Also started Rexulti ?Sister has had cycling depression that has responded well to lamotrigine with poor response to SSRIs ?[name] with a little dementia. ?SE tremor 90 mg duloxetine, ? Wt gain over a few weeks.Marland Kitchen ?Exercise and wt control ?Social isolation, low self esteem, worry about the way she looks and not usually that way. ?Crying better with duloxetine.  More dependent on H.   Anhedonia.  Black hole.  Never this low. No SI ?Having to use Xanax.   ?Asks about day treatment and [facility] ? ? ?Past Psychiatric Medication Trials: Sertraline, fluoxetine, citalopram 50, nefazodone, mirtazapine, Lexapro 30 weight gain, Viibryd 30, Wellbutrin 300, Duloxetine 90 ?Abilify, Rexulti 0.5 ?methyl folate ?buspirone, alprazolam, clonazepam, ?Under the care of [facility] since January 2001 ? ?Sister does well on lamotrigine. ? ?Review of Systems:  ?Review of Systems  ?Cardiovascular:  Negative for palpitations.  ?Neurological:  Negative for tremors.  ?Psychiatric/Behavioral:  Positive for decreased concentration, dysphoric mood and sleep disturbance. Negative for agitation, behavioral problems, confusion, hallucinations, self-injury and suicidal ideas. The patient is nervous/anxious. The patient is not hyperactive.   ? ?Medications: I have reviewed the patient's current medications. ? ?Current Outpatient Medications  ?Medication Sig Dispense Refill  ? ALPRAZolam (XANAX) 0.5 MG tablet Take 1 tablet (0.5 mg total) by mouth at bedtime as needed for anxiety. 30 tablet 1  ? atorvastatin  (LIPITOR) 20 MG tablet Take 20 mg by mouth.     ? Brexpiprazole (REXULTI) 0.5 MG TABS Take 0.5 mg by mouth daily.    ? calcium citrate-vitamin D (CITRACAL+D) 315-200 MG-UNIT tablet Take by mouth.    ? DULoxetine (CYMBALTA) 30 MG capsule Take 3 capsules (90 mg total) by mouth daily. 90 capsule 0  ? fluticasone (FLONASE) 50 MCG/ACT nasal spray fluticasone propionate 50 mcg/actuation nasal spray,suspension    ? lamoTRIgine (LAMICTAL) 25 MG tablet 1 daily for 2 weeks, then 2 daily for 2 weeks, then 4 daily (Patient taking differently: Take 50 mg by mouth daily. 1 daily for 2 weeks, then 2 daily for 2 weeks, then 4 daily) 120 tablet 0  ? levothyroxine (SYNTHROID, LEVOTHROID) 50 MCG tablet Take 50 mcg by mouth daily before breakfast.    ? raloxifene (EVISTA) 60 MG  tablet raloxifene 60 mg tablet    ? XIIDRA 5 % SOLN     ? ?No current facility-administered medications for this visit.  ? ? ?Medication Side Effects: None ? ?Allergies:  ?Allergies  ?Allergen Reactions  ? Hydrocodone-Acetaminophen Other (See Comments)  ?  Passes out  ? Morphine Other (See Comments)  ?  Passes out  ? ? ?Past Medical History:  ?Diagnosis Date  ? GAD (generalized anxiety disorder)   ? Hyperlipidemia   ? Hypothyroidism   ? Osteopenia   ? Sinusitis   ? ? ?Family History  ?Problem Relation Age of Onset  ? Arthritis Mother   ? COPD Mother   ? Hypercholesterolemia Mother   ? Dementia Mother   ? Stroke Mother   ? Prostate cancer Brother   ? Cancer Maternal Grandmother   ?     Oral  ? ? ?Social History  ? ?Socioeconomic History  ? Marital status: Married  ?  Spouse name: Not on file  ? Number of children: Not on file  ? Years of education: Not on file  ? Highest education level: Not on file  ?Occupational History  ? Not on file  ?Tobacco Use  ? Smoking status: Never  ? Smokeless tobacco: Never  ?Substance and Sexual Activity  ? Alcohol use: Not on file  ? Drug use: Not on file  ? Sexual activity: Not on file  ?Other Topics Concern  ? Not on file  ?Social  History Narrative  ? Married.  ? 1 child. 2 grandchildren.  ? 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  ? Enjoys exercising, spending time with family  ? ?Social Determinants of Health  ? ?Financial Resou

## 2021-12-30 ENCOUNTER — Other Ambulatory Visit: Payer: Self-pay | Admitting: Psychiatry

## 2021-12-30 DIAGNOSIS — F3341 Major depressive disorder, recurrent, in partial remission: Secondary | ICD-10-CM

## 2022-01-06 ENCOUNTER — Encounter: Payer: Medicare Other | Admitting: Psychiatry

## 2022-01-06 ENCOUNTER — Telehealth: Payer: Self-pay | Admitting: Psychiatry

## 2022-01-06 NOTE — Telephone Encounter (Signed)
Please see message. °

## 2022-01-06 NOTE — Telephone Encounter (Signed)
Pt jsut wants to check in- CC could not do appt with her today. She is taking 1- '30mg'$  Cymbalta, 2-'25mg'$  Lamotrigine, 1- '10mg'$  Trintellix, 1-1.'0mg'$  Rexulti- She is doing well and wants to make sure she needs to continue this med regimen as well as go up on lamotrigine.  ?

## 2022-01-06 NOTE — Telephone Encounter (Signed)
Patient notified. I provided the information to her and she wrote it down and repeated it back to me.  ?

## 2022-01-06 NOTE — Telephone Encounter (Signed)
After 10 days of duloxetine 30 mg daily she can stop it.  Continue Trintellix 10 mg daily. ?Take lamotrigine 25 mg tablets 2 daily for a total of 2 weeks then increase to 4 daily which is 100 mg.  When she runs out of that we will give a 100 mg tablet to take the place of the 425 mg tablets..  I am glad she is doing better ?

## 2022-01-10 DIAGNOSIS — E039 Hypothyroidism, unspecified: Secondary | ICD-10-CM | POA: Diagnosis not present

## 2022-01-10 DIAGNOSIS — Z13228 Encounter for screening for other metabolic disorders: Secondary | ICD-10-CM | POA: Diagnosis not present

## 2022-01-10 DIAGNOSIS — E559 Vitamin D deficiency, unspecified: Secondary | ICD-10-CM | POA: Diagnosis not present

## 2022-01-14 ENCOUNTER — Other Ambulatory Visit: Payer: Self-pay | Admitting: Psychiatry

## 2022-01-16 ENCOUNTER — Telehealth: Payer: Self-pay | Admitting: Psychiatry

## 2022-01-16 ENCOUNTER — Ambulatory Visit
Admission: RE | Admit: 2022-01-16 | Discharge: 2022-01-16 | Disposition: A | Discharge status: Home / Self Care | Payer: Medicare Other | Source: Ambulatory Visit | Attending: Obstetrics and Gynecology | Admitting: Obstetrics and Gynecology

## 2022-01-16 ENCOUNTER — Other Ambulatory Visit: Payer: Self-pay | Admitting: Psychiatry

## 2022-01-16 DIAGNOSIS — Z7983 Long term (current) use of bisphosphonates: Secondary | ICD-10-CM | POA: Diagnosis not present

## 2022-01-16 DIAGNOSIS — Z1231 Encounter for screening mammogram for malignant neoplasm of breast: Secondary | ICD-10-CM | POA: Diagnosis not present

## 2022-01-16 DIAGNOSIS — Z01419 Encounter for gynecological examination (general) (routine) without abnormal findings: Secondary | ICD-10-CM | POA: Diagnosis not present

## 2022-01-16 DIAGNOSIS — N958 Other specified menopausal and perimenopausal disorders: Secondary | ICD-10-CM | POA: Diagnosis not present

## 2022-01-16 DIAGNOSIS — F3341 Major depressive disorder, recurrent, in partial remission: Secondary | ICD-10-CM

## 2022-01-16 DIAGNOSIS — M8588 Other specified disorders of bone density and structure, other site: Secondary | ICD-10-CM | POA: Diagnosis not present

## 2022-01-16 DIAGNOSIS — Z6824 Body mass index (BMI) 24.0-24.9, adult: Secondary | ICD-10-CM | POA: Diagnosis not present

## 2022-01-16 DIAGNOSIS — Z124 Encounter for screening for malignant neoplasm of cervix: Secondary | ICD-10-CM | POA: Diagnosis not present

## 2022-01-16 MED ORDER — BREXPIPRAZOLE 1 MG PO TABS: 1.0000 mg | ORAL_TABLET | Freq: Every day | ORAL | 0 refills | Status: DC

## 2022-01-16 MED ORDER — LAMOTRIGINE 100 MG PO TABS: 100.0000 mg | ORAL_TABLET | Freq: Every day | ORAL | 0 refills | Status: DC

## 2022-01-16 MED ORDER — VORTIOXETINE HBR 10 MG PO TABS: 10.0000 mg | ORAL_TABLET | Freq: Every day | ORAL | 0 refills | Status: DC

## 2022-01-16 NOTE — Telephone Encounter (Signed)
Reviewed meds with patient. She needs refill on Rexulti 1 mg and needs Rx for Trintellix sent to pharmacy.  ?

## 2022-01-16 NOTE — Telephone Encounter (Signed)
Rx sent 

## 2022-01-16 NOTE — Telephone Encounter (Signed)
Pt would like a call back to confirm that she is taking the right meds and right qtys.  She said Dr. Clovis Pu had made changes and she just wants to makes she is taking things correctly. ?She said she is "doing great". ? ?Next appt 5/24 ?

## 2022-01-23 ENCOUNTER — Other Ambulatory Visit: Payer: Self-pay | Admitting: Psychiatry

## 2022-02-09 ENCOUNTER — Other Ambulatory Visit: Payer: Self-pay | Admitting: Psychiatry

## 2022-02-09 DIAGNOSIS — F3341 Major depressive disorder, recurrent, in partial remission: Secondary | ICD-10-CM

## 2022-02-14 ENCOUNTER — Other Ambulatory Visit: Payer: Self-pay | Admitting: Psychiatry

## 2022-02-15 ENCOUNTER — Encounter: Payer: Self-pay | Admitting: Psychiatry

## 2022-02-15 ENCOUNTER — Ambulatory Visit (INDEPENDENT_AMBULATORY_CARE_PROVIDER_SITE_OTHER): Payer: Medicare Other | Admitting: Psychiatry

## 2022-02-15 DIAGNOSIS — F5105 Insomnia due to other mental disorder: Secondary | ICD-10-CM

## 2022-02-15 DIAGNOSIS — F411 Generalized anxiety disorder: Secondary | ICD-10-CM | POA: Diagnosis not present

## 2022-02-15 DIAGNOSIS — F3341 Major depressive disorder, recurrent, in partial remission: Secondary | ICD-10-CM | POA: Diagnosis not present

## 2022-02-15 MED ORDER — LAMOTRIGINE 150 MG PO TABS: 150.0000 mg | ORAL_TABLET | Freq: Every day | ORAL | 1 refills | Status: DC

## 2022-02-15 MED ORDER — VORTIOXETINE HBR 10 MG PO TABS: 10.0000 mg | ORAL_TABLET | Freq: Every day | ORAL | 1 refills | Status: DC

## 2022-02-15 NOTE — Patient Instructions (Addendum)
Stop Rexulti Increase lamotrigine to 150 mg daily Call if you feel any more depression and we will increase Trintellix

## 2022-02-15 NOTE — Progress Notes (Addendum)
Andrea [name] [id number] 1951-06-27 71 y.o.  Subjective:   Patient ID:  Andrea [name] is a 71 y.o. (DOB [birth year]) female.  Chief Complaint:  Chief Complaint  Patient presents with   Follow-up   Depression   Anxiety    HPI [name] presents to the office today for follow-up of MDE and anxiety disorder.  seen 09/2019.  No meds were changed.  Been on Lexapro 20 since early 2017.  02/27/2020 phone call from patient: Pt experiencing more anxiety than she normally has. Pt would like to know what are some options that she has as far as her medication. Pt will be in an appt  MD response: If the anxiety is just occasional then using alprazolam as needed would be an okay thing to do.  If it is been fairly persistent for a couple of weeks or more than the simplest solution would be to increase the Lexapro to 1-1/2 tablets daily.  Historically she has done well on Lexapro 20 mg daily for an extended period of time.  If she is under temporary stress then once that is resolved we could drop it back to 1 daily.  If this does not work as expected then she should schedule an earlier appointment. Pt response: Patient called back and she's been having temporary stress, but it's not bad. Things are great, she's working out and feeling okay for the most part. It's been going on for a bit so she agrees to the increase Lexapro 20 mg 1.5 tablets daily. She has been taking alprazolam at hs occasionally to help sleep better and it does help.   12/06/2020 appointment with the following noted:  No covid.  H Covid and recovered.   Had increased Lexapro to 30 mg for 90 days and saw a difference but started seeing weight gain and was doing oK and able to drop back and been OK. Still renovating a house for a year.  Sold her house and mourns the house. Calmed down.  Kept 3 gkids and dogs 10 days.   [place] for new house May 1. Rare alprazolam for HS. Stress moving to condo and would have crying spell over the move  and some problems she was running into dealing with it.  Hard with change.  But taking time to adjust to the idea of moving from her house of 23 years.  Initially refused but he left it up to her and she's come to peace about it.  [name]   Good response to medication and no SE.  Exercising is good medicine.  If doesn't then doesn't feel as good. Satisfied. Plan: no med changes  08/01/21  appt noted:  seen with [name] died August 05, 2023.  A lot of ups and downs per D.  Hard dips. Got worse when retired and dealing with the new  house.  Had trouble getting rid of things from the old house causing regrets over sellling the house.  D notes doesn't do well with instability  and stressors.  Ruminates on past mistakes. Blamed [name] for the  difficulty renovating the house.  Neg self talk and beats herself up. When not enough sleep will get depressed.  Does better if busy.   Dreads things.  Patient denies difficulty with sleep initiation or maintenance. Denies appetite disturbance.  Patient reports that energy and motivation have been good. Patient denies any difficulty with concentration.  Patient denies any suicidal ideation.  Plan: Abilify 2.5 mg daily  for a week, then 5 mg daily. Continue Lexapro 20 mg daily  08/31/2021 appt noted: It worked immediately.  Insomnia with EMA. Normally 9 hours.  It's like I'm high.  Not tired.  Average 3-4 hours. Not depressed and no crazy negative thoughts.  Good response.  Energetic but up in middle of night. Plan: Okay to stop Abilify because of insomnia.   09/29/2021 appointment with the following noted: Parties were great.  Sleep problem resolved.  [place] of rest at the beach. Maybe the last week or 2 less motivation and socialization.  Not laying in the bed.  Just not as good as she was.  Not as outgoing as normal. No alcohol now.  Only had 1-2 at night.  Not ruminating.   Willing to restart Abilify at lower dose 2 mg daily. 3 grandsons  Plan: Relapse  depression recurs she can resume Abilify  but lower dose 2 mg daily or every other day to try to avoid the insomnia where she can contact our office and we can discussed the possibility of an alternative antidepressant such as Trintellix or duloxetine.  12/28/2021 appointment noted: Several phone calls since she was here.  Abilify plus Lexapro failed.  Switch to duloxetine 90 mg which she started 12/02/2018 2023 Started lamotrigine. Also started Rexulti Sister has had cycling depression that has responded well to lamotrigine with poor response to SSRIs [name] with a little dementia. SE tremor 90 mg duloxetine, ? Wt gain over a few weeks.. Exercise and wt control Social isolation, low self esteem, worry about the way she looks and not usually that way. Crying better with duloxetine.  More dependent on H.   Anhedonia.  Black hole.  Never this low. No SI Having to use Xanax.   Asks about day treatment and [place] Plan: rec TMS Continue lamotrigine as prescribed Increase Rexulti to 1 mg daily Switch to Trintellix:  reduce duloxetine to 2 of the 30 mg capsules and start Trintellix 5 mg daily for 5 days,  Then Increase Trintellix to 10 mg daily and reduce duloxetine to 1 capsule for 1 week. Then stop duloxetine.  02/15/22 appt noted: On Trintellix , Rexulti 1 and lamotrigine I feel good.  Walks 3 miles daily is great for mental health. [place] would not be covered by [facility]. Started feeling better after a week or 2 after the last visit. SE appetite increased. No nausea. Not crying anymore. Sleeping well and no longer ruminates.   Past Psychiatric Medication Trials: Sertraline, fluoxetine, citalopram 50, nefazodone, mirtazapine, Lexapro 30 weight gain, Viibryd 30, Wellbutrin 300, Duloxetine 90 SE tremor Abilify, Rexulti 0.5 methyl folate buspirone, alprazolam, clonazepam, Under the care of [facility] since January 2001  Sister does well on lamotrigine.  Review of Systems:  Review  of Systems  Cardiovascular:  Negative for palpitations.  Neurological:  Negative for tremors.  Psychiatric/Behavioral:  Positive for decreased concentration, dysphoric mood and sleep disturbance. Negative for agitation, behavioral problems, confusion, hallucinations, self-injury and suicidal ideas. The patient is nervous/anxious. The patient is not hyperactive.    Medications: I have reviewed the patient's current medications.  Current Outpatient Medications  Medication Sig Dispense Refill   ALPRAZolam (XANAX) 0.5 MG tablet Take 1 tablet (0.5 mg total) by mouth at bedtime as needed for anxiety. 30 tablet 1   atorvastatin (LIPITOR) 20 MG tablet Take 20 mg by mouth.      calcium citrate-vitamin D (CITRACAL+D) 315-200 MG-UNIT tablet Take by mouth.     fluticasone (FLONASE)  50 MCG/ACT nasal spray fluticasone propionate 50 mcg/actuation nasal spray,suspension     levothyroxine (SYNTHROID, LEVOTHROID) 50 MCG tablet Take 50 mcg by mouth daily before breakfast.     raloxifene (EVISTA) 60 MG tablet raloxifene 60 mg tablet     XIIDRA 5 % SOLN      lamoTRIgine (LAMICTAL) 150 MG tablet Take 1 tablet (150 mg total) by mouth daily. 90 tablet 1   vortioxetine HBr (TRINTELLIX) 10 MG TABS tablet Take 1 tablet (10 mg total) by mouth daily. 30 tablet 1   No current facility-administered medications for this visit.    Medication Side Effects: None  Allergies:  Allergies  Allergen Reactions   Hydrocodone-Acetaminophen Other (See Comments)    Passes out   Morphine Other (See Comments)    Passes out    Past Medical History:  Diagnosis Date   GAD (generalized anxiety disorder)    Hyperlipidemia    Hypothyroidism    Osteopenia    Sinusitis     Family History  Problem Relation Age of Onset   Arthritis Mother    COPD Mother    Hypercholesterolemia Mother    Dementia Mother    Stroke Mother    Prostate cancer Brother    Cancer Maternal Grandmother        Oral    Social History    Socioeconomic History   Marital status: Married    Spouse name: Not on file   Number of children: Not on file   Years of education: Not on file   Highest education level: Not on file  Occupational History   Not on file  Tobacco Use   Smoking status: Never   Smokeless tobacco: Never  Substance and Sexual Activity   Alcohol use: Not on file   Drug use: Not on file   Sexual activity: Not on file  Other Topics Concern   Not on file  Social History Narrative   Married.   1 child. 2 grandchildren.   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   Enjoys exercising, spending time with family   Social Determinants of Health   Financial Resource Strain: Not on file  Food Insecurity: Not on file  Transportation Needs: Not on file  Physical Activity: Not on file  Stress: Not on file  Social Connections: Not on file  Intimate Partner Violence: Not on file    Past Medical History, Surgical history, Social history, and Family history were reviewed and updated as appropriate.   3 grandsons.  Please see review of systems for further details on the patient's review from today.   Objective:   Physical Exam:  There were no vitals taken for this visit.  Physical Exam Constitutional:      General: She is not in acute distress.    Appearance: She is well-developed.  Musculoskeletal:        General: No deformity.  Neurological:     Mental Status: She is alert and oriented to person, place, and time.     Motor: No tremor.     Coordination: Coordination normal.     Gait: Gait normal.  Psychiatric:        Attention and Perception: She is attentive. She does not perceive auditory hallucinations.        Mood and Affect: Mood is anxious and depressed. Affect is tearful. Affect is not labile, blunt, angry or inappropriate.        Speech: Speech normal.        Behavior:  Behavior normal.        Thought Content: Thought content normal. Thought content is not delusional. Thought content  does not include homicidal or suicidal ideation. Thought content does not include suicidal plan.        Cognition and Memory: Cognition and memory normal.        Judgment: Judgment normal.     Comments: Insight intact. No auditory or visual hallucinations. No delusions.  Much worse    Lab Review:  No results found for: NA, K, CL, CO2, GLUCOSE, BUN, CREATININE, CALCIUM, PROT, ALBUMIN, AST, ALT, ALKPHOS, BILITOT, GFRNONAA, GFRAA  No results found for: WBC, RBC, HGB, HCT, PLT, MCV, MCH, MCHC, RDW, LYMPHSABS, MONOABS, EOSABS, BASOSABS  No results found for: POCLITH, LITHIUM   No results found for: PHENYTOIN, PHENOBARB, VALPROATE, CBMZ   .res Assessment: Plan:    Recurrent major depression in partial remission ([place]) - Plan: lamoTRIgine (LAMICTAL) 150 MG tablet  Generalized anxiety disorder  Insomnia due to mental condition   Greater than 50% of 30-minute face to face time with patient was spent on counseling and coordination of care.  Recent worsening mood as noted before.   Sister has had cycling depression that has responded well to lamotrigine with poor response to SSRIs Her depression responded very well to Abilify with complete remission within a few days.  The anhedonia irritability and negative thoughts resolved completely.  Her interest in things and social interest in particular improved dramatically.  However she had significant insomnia as a side effect and then relapsed with worst depression ever.   Relapse depression resolved.   No Xanax needed.  Continue trintellix 10 mg daily  Re lamotrigine and increase to 150 mg daily..    DC Rexulti DT weight gain  Rec [place] in [place]  Rec counseling  Needs a lot of reassurance   FU 2  mos  Lynder Parents, MD, DFAPA  Please see After Visit Summary for patient specific instructions.  Lynder Parents, MD, DFAPA   No future appointments.   No orders of the defined types were placed in this encounter.       -------------------------------

## 2022-02-22 ENCOUNTER — Telehealth: Payer: Self-pay | Admitting: Primary Care

## 2022-02-22 NOTE — Telephone Encounter (Signed)
Left message for patient to call back and schedule Medicare Annual Wellness Visit (AWV) either virtually or phone  awvi 02/24/19 per [facility]   please schedule at anytime with health coach  This should be a 45 minute visit.  I left my direct # [phone]

## 2022-02-23 ENCOUNTER — Other Ambulatory Visit: Payer: Self-pay | Admitting: Psychiatry

## 2022-02-24 ENCOUNTER — Ambulatory Visit (INDEPENDENT_AMBULATORY_CARE_PROVIDER_SITE_OTHER): Payer: Medicare Other

## 2022-02-24 VITALS — Ht 63.0 in | Wt 130.0 lb

## 2022-02-24 DIAGNOSIS — Z0000 Encounter for general adult medical examination without abnormal findings: Secondary | ICD-10-CM | POA: Diagnosis not present

## 2022-02-24 NOTE — Patient Instructions (Signed)
Andrea [name] , Thank you for taking time to come for your Medicare Wellness Visit. I appreciate your ongoing commitment to your health goals. Please review the following plan we discussed and let me know if I can assist you in the future.   Screening recommendations/referrals: Colonoscopy: completed 10/13/2020, due 10/13/2030 Mammogram: completed 01/16/2022, due 01/18/2023 Bone Density: completed 4/24/202  Recommended yearly ophthalmology/optometry visit for glaucoma screening and checkup Recommended yearly dental visit for hygiene and checkup  Vaccinations: Influenza vaccine: due 04/25/2022 Pneumococcal vaccine: due Tdap vaccine: complete 06/24/2019, due 06/23/2029 Shingles vaccine: completed   Covid-19: 11/18/2019, 10/27/2019  Advanced directives: Please bring a copy of your POA (Power of Attorney) and/or Living Will to your next appointment.   Conditions/risks identified: none  Next appointment: Follow up in one year for your annual wellness visit    Preventive Care 65 Years and Older, Female Preventive care refers to lifestyle choices and visits with your health care provider that can promote health and wellness. What does preventive care include? A yearly physical exam. This is also called an annual well check. Dental exams once or twice a year. Routine eye exams. Ask your health care provider how often you should have your eyes checked. Personal lifestyle choices, including: Daily care of your teeth and gums. Regular physical activity. Eating a healthy diet. Avoiding tobacco and drug use. Limiting alcohol use. Practicing safe sex. Taking low-dose aspirin every day. Taking vitamin and mineral supplements as recommended by your health care provider. What happens during an annual well check? The services and screenings done by your health care provider during your annual well check will depend on your age, overall health, lifestyle risk factors, and family history of  disease. Counseling  Your health care provider may ask you questions about your: Alcohol use. Tobacco use. Drug use. Emotional well-being. Home and relationship well-being. Sexual activity. Eating habits. History of falls. Memory and ability to understand (cognition). Work and work Statistician. Reproductive health. Screening  You may have the following tests or measurements: Height, weight, and BMI. Blood pressure. Lipid and cholesterol levels. These may be checked every 5 years, or more frequently if you are over 33 years old. Skin check. Lung cancer screening. You may have this screening every year starting at age 35 if you have a 30-pack-year history of smoking and currently smoke or have quit within the past 15 years. Fecal occult blood test (FOBT) of the stool. You may have this test every year starting at age 46. Flexible sigmoidoscopy or colonoscopy. You may have a sigmoidoscopy every 5 years or a colonoscopy every 10 years starting at age 28. Hepatitis C blood test. Hepatitis B blood test. Sexually transmitted disease (STD) testing. Diabetes screening. This is done by checking your blood sugar (glucose) after you have not eaten for a while (fasting). You may have this done every 1-3 years. Bone density scan. This is done to screen for osteoporosis. You may have this done starting at age 29. Mammogram. This may be done every 1-2 years. Talk to your health care provider about how often you should have regular mammograms. Talk with your health care provider about your test results, treatment options, and if necessary, the need for more tests. Vaccines  Your health care provider may recommend certain vaccines, such as: Influenza vaccine. This is recommended every year. Tetanus, diphtheria, and acellular pertussis (Tdap, Td) vaccine. You may need a Td booster every 10 years. Zoster vaccine. You may need this after age 15. Pneumococcal 13-valent conjugate (PCV13)  vaccine. One  dose is recommended after age 34. Pneumococcal polysaccharide (PPSV23) vaccine. One dose is recommended after age 43. Talk to your health care provider about which screenings and vaccines you need and how often you need them. This information is not intended to replace advice given to you by your health care provider. Make sure you discuss any questions you have with your health care provider. Document Released: 10/08/2015 Document Revised: 05/31/2016 Document Reviewed: 07/13/2015 Elsevier Interactive Patient Education  2017 [place] Prevention in the Home Falls can cause injuries. They can happen to people of all ages. There are many things you can do to make your home safe and to help prevent falls. What can I do on the outside of my home? Regularly fix the edges of walkways and driveways and fix any cracks. Remove anything that might make you trip as you walk through a door, such as a raised step or threshold. Trim any bushes or trees on the path to your home. Use bright outdoor lighting. Clear any walking paths of anything that might make someone trip, such as rocks or tools. Regularly check to see if handrails are loose or broken. Make sure that both sides of any steps have handrails. Any raised decks and porches should have guardrails on the edges. Have any leaves, snow, or ice cleared regularly. Use sand or salt on walking paths during winter. Clean up any spills in your garage right away. This includes oil or grease spills. What can I do in the bathroom? Use night lights. Install grab bars by the toilet and in the tub and shower. Do not use towel bars as grab bars. Use non-skid mats or decals in the tub or shower. If you need to sit down in the shower, use a plastic, non-slip stool. Keep the floor dry. Clean up any water that spills on the floor as soon as it happens. Remove soap buildup in the tub or shower regularly. Attach bath mats securely with double-sided  non-slip rug tape. Do not have throw rugs and other things on the floor that can make you trip. What can I do in the bedroom? Use night lights. Make sure that you have a light by your bed that is easy to reach. Do not use any sheets or blankets that are too big for your bed. They should not hang down onto the floor. Have a firm chair that has side arms. You can use this for support while you get dressed. Do not have throw rugs and other things on the floor that can make you trip. What can I do in the kitchen? Clean up any spills right away. Avoid walking on wet floors. Keep items that you use a lot in easy-to-reach places. If you need to reach something above you, use a strong step stool that has a grab bar. Keep electrical cords out of the way. Do not use floor polish or wax that makes floors slippery. If you must use wax, use non-skid floor wax. Do not have throw rugs and other things on the floor that can make you trip. What can I do with my stairs? Do not leave any items on the stairs. Make sure that there are handrails on both sides of the stairs and use them. Fix handrails that are broken or loose. Make sure that handrails are as long as the stairways. Check any carpeting to make sure that it is firmly attached to the stairs. Fix any carpet that is loose  or worn. Avoid having throw rugs at the top or bottom of the stairs. If you do have throw rugs, attach them to the floor with carpet tape. Make sure that you have a light switch at the top of the stairs and the bottom of the stairs. If you do not have them, ask someone to add them for you. What else can I do to help prevent falls? Wear shoes that: Do not have high heels. Have rubber bottoms. Are comfortable and fit you well. Are closed at the toe. Do not wear sandals. If you use a stepladder: Make sure that it is fully opened. Do not climb a closed stepladder. Make sure that both sides of the stepladder are locked into place. Ask  someone to hold it for you, if possible. Clearly mark and make sure that you can see: Any grab bars or handrails. First and last steps. Where the edge of each step is. Use tools that help you move around (mobility aids) if they are needed. These include: Canes. Walkers. Scooters. Crutches. Turn on the lights when you go into a dark area. Replace any light bulbs as soon as they burn out. Set up your furniture so you have a clear path. Avoid moving your furniture around. If any of your floors are uneven, fix them. If there are any pets around you, be aware of where they are. Review your medicines with your doctor. Some medicines can make you feel dizzy. This can increase your chance of falling. Ask your doctor what other things that you can do to help prevent falls. This information is not intended to replace advice given to you by your health care provider. Make sure you discuss any questions you have with your health care provider. Document Released: 07/08/2009 Document Revised: 02/17/2016 Document Reviewed: 10/16/2014 Elsevier Interactive Patient Education  2017 [employer].

## 2022-02-24 NOTE — Progress Notes (Signed)
I connected with Andrea [name] today by telephone and verified that I am speaking with the correct person using two identifiers. Location patient: home Location provider: work Persons participating in the virtual visit: Andrea [name], Glenna Durand LPN.   I discussed the limitations, risks, security and privacy concerns of performing an evaluation and management service by telephone and the availability of in person appointments. I also discussed with the patient that there may be a patient responsible charge related to this service. The patient expressed understanding and verbally consented to this telephonic visit.    Interactive audio and video telecommunications were attempted between this provider and patient, however failed, due to patient having technical difficulties OR patient did not have access to video capability.  We continued and completed visit with audio only.     Vital signs may be patient reported or missing.  Subjective:   Andrea [name] is a 71 y.o. female who presents for an Initial Medicare Annual Wellness Visit.  Review of Systems     Cardiac Risk Factors include: advanced age (>89mn, >>29women);dyslipidemia     Objective:    Today's Vitals   02/24/22 1141  Weight: 130 lb (59 kg)  Height: '5\' 3"'$  (1.6 m)   Body mass index is 23.03 kg/m.     02/24/2022   11:46 AM  Advanced Directives  Does Patient Have a Medical Advance Directive? Yes  Type of [place] will  Copy of HP[place]in Chart? No - copy requested    Current Medications (verified) Outpatient Encounter Medications as of 02/24/2022  Medication Sig   ALPRAZolam (XANAX) 0.5 MG tablet Take 1 tablet (0.5 mg total) by mouth at bedtime as needed for anxiety.   atorvastatin (LIPITOR) 20 MG tablet Take 20 mg by mouth.    calcium citrate-vitamin D (CITRACAL+D) 315-200 MG-UNIT tablet Take by mouth.   fluticasone (FLONASE) 50 MCG/ACT nasal spray  fluticasone propionate 50 mcg/actuation nasal spray,suspension   lamoTRIgine (LAMICTAL) 150 MG tablet Take 1 tablet (150 mg total) by mouth daily.   levothyroxine (SYNTHROID, LEVOTHROID) 50 MCG tablet Take 50 mcg by mouth daily before breakfast.   raloxifene (EVISTA) 60 MG tablet raloxifene 60 mg tablet   vortioxetine HBr (TRINTELLIX) 10 MG TABS tablet Take 1 tablet (10 mg total) by mouth daily.   XIIDRA 5 % SOLN    No facility-administered encounter medications on file as of 02/24/2022.    Allergies (verified) Hydrocodone-acetaminophen and Morphine   History: Past Medical History:  Diagnosis Date   GAD (generalized anxiety disorder)    Hyperlipidemia    Hypothyroidism    Osteopenia    Sinusitis    Past Surgical History:  Procedure Laterality Date   AUGMENTATION MAMMAPLASTY     HUMERUS FRACTURE SURGERY  2013   Family History  Problem Relation Age of Onset   Arthritis Mother    COPD Mother    Hypercholesterolemia Mother    Dementia Mother    Stroke Mother    Prostate cancer Brother    Cancer Maternal Grandmother        Oral   Social History   Socioeconomic History   Marital status: Married    Spouse name: Not on file   Number of children: Not on file   Years of education: Not on file   Highest education level: Not on file  Occupational History   Not on file  Tobacco Use   Smoking status: Never   Smokeless tobacco: Never  Vaping Use   Vaping Use: Never used  Substance and Sexual Activity   Alcohol use: Not Currently    Comment: social   Drug use: Never   Sexual activity: Not on file  Other Topics Concern   Not on file  Social History Narrative   Married.   1 child. 2 grandchildren.   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   Enjoys exercising, spending time with family   Social Determinants of Health   Financial Resource Strain: Low Risk    Difficulty of Paying Living Expenses: Not hard at all  Food Insecurity: No Food Insecurity   Worried About  Charity fundraiser in the Last Year: Never true   Arboriculturist in the Last Year: Never true  Transportation Needs: No Transportation Needs   Lack of Transportation (Medical): No   Lack of Transportation (Non-Medical): No  Physical Activity: Sufficiently Active   Days of Exercise per Week: 5 days   Minutes of Exercise per Session: 90 min  Stress: No Stress Concern Present   Feeling of Stress : Not at all  Social Connections: Not on file    Tobacco Counseling Counseling given: Not Answered   Clinical Intake:  Pre-visit preparation completed: Yes  Pain : No/denies pain     Nutritional Status: BMI of 19-24  Normal Nutritional Risks: None Diabetes: No  How often do you need to have someone help you when you read instructions, pamphlets, or other written materials from your doctor or pharmacy?: 1 - Never What is the last grade level you completed in school?: 14 yrs  Diabetic? no  Interpreter Needed?: No  Information entered by :: NAllen LPN   Activities of Daily Living    02/24/2022   11:47 AM 06/14/2021    3:10 PM  In your present state of health, do you have any difficulty performing the following activities:  Hearing? 0 0  Vision? 0 0  Difficulty concentrating or making decisions? 0 0  Walking or climbing stairs? 0 0  Dressing or bathing? 0 0  Doing errands, shopping? 0 0  Preparing Food and eating ? N   Using the Toilet? N   In the past six months, have you accidently leaked urine? N   Do you have problems with loss of bowel control? N   Managing your Medications? N   Managing your Finances? N   Housekeeping or managing your Housekeeping? N     Patient Care Team: Pleas Koch, NP as PCP - General (Internal Medicine) Dian Queen, MD as Consulting Physician (Obstetrics and Gynecology)  Indicate any recent Medical Services you may have received from other than [facility] providers in the past year (date may be approximate).     Assessment:   This  is a routine wellness examination for [place].  Hearing/Vision screen Vision Screening - Comments:: Regular eye exams, Dr. Wyatt Portela  Dietary issues and exercise activities discussed: Current Exercise Habits: Home exercise routine, Type of exercise: walking, Time (Minutes): > 60, Frequency (Times/Week): 5, Weekly Exercise (Minutes/Week): 0   Goals Addressed             This Visit's Progress    Patient Stated       02/24/2022, stay healthy       Depression Screen    02/24/2022   11:47 AM 06/14/2021    3:09 PM  PHQ 2/9 Scores  PHQ - 2 Score 0 0  PHQ- 9 Score  0    Fall  Risk    02/24/2022   11:46 AM 06/14/2021    3:09 PM 04/23/2019    3:29 PM  [place] in the past year? 0 0 0  Comment   Emmi Telephone Survey: data to providers prior to load  Number falls in past yr: 0 0   Injury with Fall? 0 0   Risk for fall due to : Medication side effect    Follow up Falls evaluation completed;Education provided;Falls prevention discussed      FALL RISK PREVENTION PERTAINING TO THE HOME:  Any stairs in or around the home? Yes  If so, are there any without handrails? No  Home free of loose throw rugs in walkways, pet beds, electrical cords, etc? Yes  Adequate lighting in your home to reduce risk of falls? Yes   ASSISTIVE DEVICES UTILIZED TO PREVENT FALLS:  Life alert? No  Use of a cane, walker or w/c? No  Grab bars in the bathroom? No  Shower chair or bench in shower? No  Elevated toilet seat or a handicapped toilet? Yes   TIMED UP AND GO:  Was the test performed? No .      Cognitive Function:        02/24/2022   11:48 AM  6CIT Screen  What Year? 0 points  What month? 0 points  What time? 0 points  Count back from 20 0 points  Months in reverse 0 points  Repeat phrase 0 points  Total Score 0 points    Immunizations Immunization History  Administered Date(s) Administered   Fluad Quad(high Dose 65+) 06/24/2019   PFIZER Comirnaty(Gray Top)Covid-19 Tri-Sucrose  Vaccine 10/27/2019, 11/18/2019   Pneumococcal Polysaccharide-23 10/09/2014   Td 06/24/2019   Zoster Recombinat (Shingrix) 10/11/2017    TDAP status: Up to date  Flu Vaccine status: Up to date  Pneumococcal vaccine status: Due, Education has been provided regarding the importance of this vaccine. Advised may receive this vaccine at local pharmacy or Health Dept. Aware to provide a copy of the vaccination record if obtained from local pharmacy or Health Dept. Verbalized acceptance and understanding.  Covid-19 vaccine status: Completed vaccines  Qualifies for Shingles Vaccine? Yes   Zostavax completed No   Shingrix Completed?: Yes  Screening Tests Health Maintenance  Topic Date Due   Hepatitis C Screening  Never done   Pneumonia Vaccine 4+ Years old (2 - PCV) 10/10/2015   Zoster Vaccines- Shingrix (2 of 2) 12/06/2017   COVID-19 Vaccine (4 - Booster for Pfizer series) 03/12/2022 (Originally 11/18/2020)   INFLUENZA VACCINE  04/25/2022   MAMMOGRAM  01/17/2024   TETANUS/TDAP  06/23/2029   COLONOSCOPY (Pts 45-65yr Insurance coverage will need to be confirmed)  10/13/2030   DEXA SCAN  Completed   HPV VACCINES  Aged Out    Health Maintenance  Health Maintenance Due  Topic Date Due   Hepatitis C Screening  Never done   Pneumonia Vaccine 69 Years old (2 - PCV) 10/10/2015   Zoster Vaccines- Shingrix (2 of 2) 12/06/2017    Colorectal cancer screening: Type of screening: Colonoscopy. Completed 10/13/2020. Repeat every 10 years  Mammogram status: Completed 01/16/2022. Repeat every year  Bone Density status: Completed 01/16/2022.   Lung Cancer Screening: (Low Dose CT Chest recommended if Age 69-80years, 30 pack-year currently smoking OR have quit w/in 15years.) does not qualify.   Lung Cancer Screening Referral: no  Additional Screening:  Hepatitis C Screening: does qualify;   Vision Screening: Recommended annual ophthalmology exams for  early detection of glaucoma and other  disorders of the eye. Is the patient up to date with their annual eye exam?  Yes  Who is the provider or what is the name of the office in which the patient attends annual eye exams? Dr. Wyatt Portela If pt is not established with a provider, would they like to be referred to a provider to establish care? No .   Dental Screening: Recommended annual dental exams for proper oral hygiene  Community Resource Referral / Chronic Care Management: CRR required this visit?  No   CCM required this visit?  No      Plan:     I have personally reviewed and noted the following in the patient's chart:   Medical and social history Use of alcohol, tobacco or illicit drugs  Current medications and supplements including opioid prescriptions. Patient is not currently taking opioid prescriptions. Functional ability and status Nutritional status Physical activity Advanced directives List of other physicians Hospitalizations, surgeries, and ER visits in previous 12 months Vitals Screenings to include cognitive, depression, and falls Referrals and appointments  In addition, I have reviewed and discussed with patient certain preventive protocols, quality metrics, and best practice recommendations. A written personalized care plan for preventive services as well as general preventive health recommendations were provided to patient.     [name], LPN   02/26/6802   Nurse Notes: none  Due to this being a virtual visit, the after visit summary with patients personalized plan was offered to patient via mail or my-chart. Patient would like to access on my-chart

## 2022-03-02 DIAGNOSIS — J018 Other acute sinusitis: Secondary | ICD-10-CM | POA: Diagnosis not present

## 2022-03-02 DIAGNOSIS — Z20822 Contact with and (suspected) exposure to covid-19: Secondary | ICD-10-CM | POA: Diagnosis not present

## 2022-03-02 DIAGNOSIS — R350 Frequency of micturition: Secondary | ICD-10-CM | POA: Diagnosis not present

## 2022-03-02 DIAGNOSIS — J988 Other specified respiratory disorders: Secondary | ICD-10-CM | POA: Diagnosis not present

## 2022-03-06 ENCOUNTER — Telehealth: Payer: Self-pay | Admitting: Psychiatry

## 2022-03-06 NOTE — Telephone Encounter (Signed)
Pt called at 9:15 am stating that she feels like her medicine is not working. She would like to talk to the nurse about that. Her 336 [phone]

## 2022-03-06 NOTE — Telephone Encounter (Signed)
Pt informed and she said she is not taking rexulti you stopped it last visit

## 2022-03-06 NOTE — Telephone Encounter (Signed)
My last note is not clear.  If she is still taking Rexulti 1 mg?  I want the documentation to be accurate.  There are 2 dosages of Trintellix and she is on the lower dose.  Have her increase the Trintellix to 20 mg daily.  She can use up what she has to make sure she tolerates it without nausea which is the main side effect.  If she does not have any problems with that then we will prescribe the 20 mg tablet to her.  Most of my patients are on 20 mg of Trintellix

## 2022-03-06 NOTE — Telephone Encounter (Signed)
I see that in my note now that I had DC Rexulti DT weight gain.

## 2022-03-06 NOTE — Telephone Encounter (Signed)
LVM to rtc 

## 2022-03-06 NOTE — Telephone Encounter (Signed)
Pt stated [place] the beach [place] 2 [place] and has been very sad and nervous.She thought it would pass but has only gotten worse.She stated she really needs something to help.She is taking rx as prescribed. Pharmacy [facility] [address] [place] [phone]

## 2022-03-08 DIAGNOSIS — L308 Other specified dermatitis: Secondary | ICD-10-CM | POA: Diagnosis not present

## 2022-03-10 ENCOUNTER — Telehealth: Payer: Self-pay | Admitting: Psychiatry

## 2022-03-10 NOTE — Telephone Encounter (Signed)
Pt LVM for Toni at 10:26a that she wanted to try the '20mg'$  of Trintellix.  She said she realizes it takes time to work, so she is ok with increasing the dosage.  Send to   [id number] [place] [address] [place] [address] Phone:  [phone] Fax:  [phone]  Next appt 7/19

## 2022-03-10 NOTE — Telephone Encounter (Signed)
Patient lvm returning Toni's call. She stated she does need a refill on the Trintellix. Per conversation she is not seeing a lot of improvement, but is aware it takes time. Refer to telephone encounter 6/12 if needed. Patient has a follow up appointment scheduled for 7/19.

## 2022-03-10 NOTE — Telephone Encounter (Signed)
Pulled samples and notified patient that Dr. Clovis Pu wanted her to try 20 mg dosing before sending in Rx. She said she is out of town and can't come get samples, will be back Tuesday and doesn't want to go without. She reports no SE thus far. She is requesting we go ahead and send in Rx for 20 mg.

## 2022-03-13 NOTE — Telephone Encounter (Signed)
Addendum to previous message from 03/10/22. Andrea [name] called and is currently taking Lamotrigine 50 mg and Trintellix 20 mg. She states she is having uncontrollable crying, depressed, no sleep and is having tremors. She told me she had no suicidal thoughts. Please call her at [phone].

## 2022-03-13 NOTE — Telephone Encounter (Signed)
If she thinks she is having side effects from Trintellix then stop it.  She is only been on the 20 mg for a short time.  If she is not having side effects then continue it.  Schedule her in the current opening appointment 9:30 AM on Wednesday

## 2022-03-15 ENCOUNTER — Ambulatory Visit (INDEPENDENT_AMBULATORY_CARE_PROVIDER_SITE_OTHER): Payer: Medicare Other | Admitting: Psychiatry

## 2022-03-15 ENCOUNTER — Encounter: Payer: Self-pay | Admitting: Psychiatry

## 2022-03-15 DIAGNOSIS — F5105 Insomnia due to other mental disorder: Secondary | ICD-10-CM

## 2022-03-15 DIAGNOSIS — F411 Generalized anxiety disorder: Secondary | ICD-10-CM

## 2022-03-15 DIAGNOSIS — F332 Major depressive disorder, recurrent severe without psychotic features: Secondary | ICD-10-CM

## 2022-03-15 MED ORDER — VORTIOXETINE HBR 20 MG PO TABS: 20.0000 mg | ORAL_TABLET | Freq: Every day | ORAL | 0 refills | Status: DC

## 2022-03-15 MED ORDER — CLONAZEPAM 1 MG PO TABS: 1.0000 mg | ORAL_TABLET | Freq: Every day | ORAL | 0 refills | Status: DC

## 2022-03-15 NOTE — Patient Instructions (Signed)
Vraylar 1 capsule every other day

## 2022-03-15 NOTE — Progress Notes (Signed)
Andrea [name] [id number] [birth year] 71 y.o.  Subjective:   Patient ID:  Andrea [name] is a 71 y.o. (DOB 08/14/1951) female.  Chief Complaint:  Chief Complaint  Patient presents with   Follow-up   Depression   Anxiety    HPI Andrea [name] presents to the office today for follow-up of MDE and anxiety disorder.  seen 09/2019.  No meds were changed.  Been on Lexapro 20 since early 2017.  02/27/2020 phone call from patient: Pt experiencing more anxiety than she normally has. Pt would like to know what are some options that she has as far as her medication. Pt will be in an appt  MD response: If the anxiety is just occasional then using alprazolam as needed would be an okay thing to do.  If it is been fairly persistent for a couple of weeks or more than the simplest solution would be to increase the Lexapro to 1-1/2 tablets daily.  Historically she has done well on Lexapro 20 mg daily for an extended period of time.  If she is under temporary stress then once that is resolved we could drop it back to 1 daily.  If this does not work as expected then she should schedule an earlier appointment. Pt response: Patient called back and she's been having temporary stress, but it's not bad. Things are great, she's working out and feeling okay for the most part. It's been going on for a bit so she agrees to the increase Lexapro 20 mg 1.5 tablets daily. She has been taking alprazolam at hs occasionally to help sleep better and it does help.   12/06/2020 appointment with the following noted:  No covid.  H Covid and recovered.   Had increased Lexapro to 30 mg for 90 days and saw a difference but started seeing weight gain and was doing oK and able to drop back and been OK. Still renovating a house for a year.  Sold her house and mourns the house. Calmed down.  Kept 3 gkids and dogs 10 days.   [place] for new house May 1. Rare alprazolam for HS. Stress moving to condo and would have crying spell over the move  and some problems she was running into dealing with it.  Hard with change.  But taking time to adjust to the idea of moving from her house of 23 years.  Initially refused but he left it up to her and she's come to peace about it.  Andrea [name].   Good response to medication and no SE.  Exercising is good medicine.  If doesn't then doesn't feel as good. Satisfied. Plan: no med changes  08/01/21  appt noted:  seen with Andrea [name] died 2023-08-12.  A lot of ups and downs per Andrea.  Hard dips. Got worse when retired and dealing with the new  house.  Had trouble getting rid of things from the old house causing regrets over sellling the house.  Andrea notes doesn't do well with instability  and stressors.  Ruminates on past mistakes. Blamed Andrea [name] for the  difficulty renovating the house.  Neg self talk and beats herself up. When not enough sleep will get depressed.  Does better if busy.   Dreads things.  Patient denies difficulty with sleep initiation or maintenance. Denies appetite disturbance.  Patient reports that energy and motivation have been good. Patient denies any difficulty with concentration.  Patient denies any suicidal ideation.  Plan: Abilify 2.5 mg daily  for a week, then 5 mg daily. Continue Lexapro 20 mg daily  08/31/2021 appt noted: It worked immediately.  Insomnia with EMA. Normally 9 hours.  It's like I'm high.  Not tired.  Average 3-4 hours. Not depressed and no crazy negative thoughts.  Good response.  Energetic but up in middle of night. Plan: Okay to stop Abilify because of insomnia.   09/29/2021 appointment with the following noted: Parties were great.  Sleep problem resolved.  [place] lot of rest at the beach. Maybe the last week or 2 less motivation and socialization.  Not laying in the bed.  Just not as good as she was.  Not as outgoing as normal. No alcohol now.  Only had 1-2 at night.  Not ruminating.   Willing to restart Abilify at lower dose 2 mg daily. 3 grandsons  Plan: Relapse  depression recurs she can resume Abilify  but lower dose 2 mg daily or every other day to try to avoid the insomnia where she can contact our office and we can discussed the possibility of an alternative antidepressant such as Trintellix or duloxetine.  12/28/2021 appointment noted: Several phone calls since she was here.  Abilify plus Lexapro failed.  Switch to duloxetine 90 mg which she started 12/02/2018 2023 Started lamotrigine. Also started Rexulti Sister has had cycling depression that has responded well to lamotrigine with poor response to SSRIs [name] with a little dementia. SE tremor 90 mg duloxetine, ? Wt gain over a few weeks.. Exercise and wt control Social isolation, low self esteem, worry about the way she looks and not usually that way. Crying better with duloxetine.  More dependent on H.   Anhedonia.  Black hole.  Never this low. No SI Having to use Xanax.   Asks about day treatment and [place] Plan: rec TMS Continue lamotrigine as prescribed Increase Rexulti to 1 mg daily Switch to Trintellix:  reduce duloxetine to 2 of the 30 mg capsules and start Trintellix 5 mg daily for 5 days,  Then Increase Trintellix to 10 mg daily and reduce duloxetine to 1 capsule for 1 week. Then stop duloxetine.  02/15/22 appt noted: On Trintellix , Rexulti 1 and lamotrigine I feel good.  Walks 3 miles daily is great for mental health. Startup would not be covered by [facility]. Started feeling better after a week or 2 after the last visit. SE appetite increased. No nausea. Not crying anymore. Sleeping well and no longer ruminates. Plan: Continue Trintellix but DC Rexulti due to weight gain.  Continue lamotrigine  03/06/2022 appointment with the following noted: Last 2 weeks has been very sad and nervous. Trintellix was increased to 20 mg daily  03/10/2022 complaining of ongoing depression, feeling jittery, negative thinking, early morning awakening, ruminating about past decisions.  Wanting to consider [place]  because she is desperate for improvement. Plan we will schedule urgent appointment  03/15/22 urgent appt noted:  seen with Andrea [name] Very depressed, worst ever.  Don't want to be alone, ongoing worry, ruminating on past decisions.  Racing negative thoughts.  No motivation.  Trouble staying asleep.  Getting 4 hours and used to 9 hours. Dread getting up. Using Xanax Increased Trintellix to 20 mg 9 days ago. H agrees sleep is critical. Isolating.   Need to be better by July 11.   Past Psychiatric Medication Trials: Sertraline, fluoxetine, citalopram 50, nefazodone, mirtazapine, Lexapro 30 weight gain, Viibryd 30, Wellbutrin 300, Duloxetine 90 SE tremor Abilify, Rexulti 0.5 methyl folate buspirone,  alprazolam, clonazepam,  Under the care of [facility] since January 2001  Sister does well on lamotrigine.  Review of Systems:  Review of Systems  Cardiovascular:  Negative for palpitations.  Gastrointestinal:  Negative for nausea.  Neurological:  Negative for tremors.  Psychiatric/Behavioral:  Positive for decreased concentration, dysphoric mood and sleep disturbance. Negative for agitation, behavioral problems, confusion, hallucinations, self-injury and suicidal ideas. The patient is nervous/anxious. The patient is not hyperactive.     Medications: I have reviewed the patient's current medications.  Current Outpatient Medications  Medication Sig Dispense Refill   ALPRAZolam (XANAX) 0.5 MG tablet Take 1 tablet (0.5 mg total) by mouth at bedtime as needed for anxiety. 30 tablet 1   atorvastatin (LIPITOR) 20 MG tablet Take 20 mg by mouth.      calcium citrate-vitamin Andrea (CITRACAL+Andrea) 315-200 MG-UNIT tablet Take by mouth.     clonazePAM (KLONOPIN) 1 MG tablet Take 1 tablet (1 mg total) by mouth at bedtime. 30 tablet 0   fluticasone (FLONASE) 50 MCG/ACT nasal spray fluticasone propionate 50 mcg/actuation nasal spray,suspension     lamoTRIgine (LAMICTAL) 150 MG tablet Take 1  tablet (150 mg total) by mouth daily. 90 tablet 1   levothyroxine (SYNTHROID, LEVOTHROID) 50 MCG tablet Take 50 mcg by mouth daily before breakfast.     raloxifene (EVISTA) 60 MG tablet raloxifene 60 mg tablet     vortioxetine HBr (TRINTELLIX) 10 MG TABS tablet Take 1 tablet (10 mg total) by mouth daily. (Patient taking differently: Take 10 mg by mouth daily. 2 daily) 30 tablet 1   XIIDRA 5 % SOLN      No current facility-administered medications for this visit.    Medication Side Effects: None  Allergies:  Allergies  Allergen Reactions   Hydrocodone-Acetaminophen Other (See Comments)    Passes out   Morphine Other (See Comments)    Passes out    Past Medical History:  Diagnosis Date   GAD (generalized anxiety disorder)    Hyperlipidemia    Hypothyroidism    Osteopenia    Sinusitis     Family History  Problem Relation Age of Onset   Arthritis Mother    COPD Mother    Hypercholesterolemia Mother    Dementia Mother    Stroke Mother    Prostate cancer Brother    Cancer Maternal Grandmother        Oral    Social History   Socioeconomic History   Marital status: Married    Spouse name: Not on file   Number of children: Not on file   Years of education: Not on file   Highest education level: Not on file  Occupational History   Not on file  Tobacco Use   Smoking status: Never   Smokeless tobacco: Never  Vaping Use   Vaping Use: Never used  Substance and Sexual Activity   Alcohol use: Not Currently    Comment: social   Drug use: Never   Sexual activity: Not on file  Other Topics Concern   Not on file  Social History Narrative   Married.   1 child. 2 grandchildren.   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   Enjoys exercising, spending time with family   Social Determinants of Health   Financial Resource Strain: Low Risk  (02/24/2022)   Overall Financial Resource Strain (CARDIA)    Difficulty of Paying Living Expenses: Not hard at all  Food Insecurity:  No Food Insecurity (02/24/2022)   Hunger Vital Sign  Worried About Charity fundraiser in the Last Year: Never true    [place] in the Last Year: Never true  Transportation Needs: No Transportation Needs (02/24/2022)   PRAPARE - Hydrologist (Medical): No    Lack of Transportation (Non-Medical): No  Physical Activity: Sufficiently Active (02/24/2022)   Exercise Vital Sign    Days of Exercise per Week: 5 days    Minutes of Exercise per Session: 90 min  Stress: No Stress Concern Present (02/24/2022)   [place]    Feeling of Stress : Not at all  Social Connections: Not on file  Intimate Partner Violence: Not on file    Past Medical History, Surgical history, Social history, and Family history were reviewed and updated as appropriate.   3 grandsons.  Please see review of systems for further details on the patient's review from today.   Objective:   Physical Exam:  There were no vitals taken for this visit.  Physical Exam Constitutional:      General: She is not in acute distress.    Appearance: She is well-developed.  Musculoskeletal:        General: No deformity.  Neurological:     Mental Status: She is alert and oriented to person, place, and time.     Motor: No tremor.     Coordination: Coordination normal.     Gait: Gait normal.  Psychiatric:        Attention and Perception: She is attentive. She does not perceive auditory hallucinations.        Mood and Affect: Mood is anxious and depressed. Affect is not labile, blunt, angry, tearful or inappropriate.        Speech: Speech normal.        Behavior: Behavior normal.        Thought Content: Thought content normal. Thought content is not delusional. Thought content does not include homicidal or suicidal ideation. Thought content does not include suicidal plan.        Cognition and Memory: Cognition and memory normal.         Judgment: Judgment normal.     Comments: Insight intact. No auditory or visual hallucinations. No delusions.  Much worse.  Severe depression     Lab Review:  No results found for: "NA", "K", "CL", "CO2", "GLUCOSE", "BUN", "CREATININE", "CALCIUM", "PROT", "ALBUMIN", "AST", "ALT", "ALKPHOS", "BILITOT", "GFRNONAA", "GFRAA"  No results found for: "WBC", "RBC", "HGB", "HCT", "PLT", "MCV", "MCH", "MCHC", "RDW", "LYMPHSABS", "MONOABS", "EOSABS", "BASOSABS"  No results found for: "POCLITH", "LITHIUM"   No results found for: "PHENYTOIN", "PHENOBARB", "VALPROATE", "CBMZ"   .res Assessment: Plan:    Severe episode of recurrent major depressive disorder, without psychotic features ([place])  Generalized anxiety disorder  Insomnia due to mental condition - Plan: clonazePAM (KLONOPIN) 1 MG tablet   Greater than 50% of 50-minute face to face time with patient was spent on counseling and coordination of care.  Recent worsening mood as noted before.  Sister has had cycling depression that has responded well to lamotrigine with poor response to SSRIs Her depression responded very well to Abilify with complete remission within a few days.  The anhedonia irritability and negative thoughts resolved completely.  Her interest in things and social interest in particular improved dramatically.  However she had significant insomnia as a side effect and then relapsed with worst depression ever.  She then responded to Rexulti 1 mg daily  however had weight gain and wanted to stop that.  Once stopping it she became depressed significantly within a week or 2.  She now describes this is the worst depression ever.  She has a lot of anxiety. She has extensive questions around faster acting antidepressant treatment such as Spravato and team [place].  She has a wedding she has to attend on July 11 and she is desperate to feel better because she does not feel like she can go in her current state.  She has some death thoughts but  no acute suicidal intent or plan.  Extensive discussion around quicker acting options such as TMS, Spravato in detail.  However given her positive response to Abilify and then Rexulti but unfortunate side effects we will prescribe Vraylar because of it being quick acting.  She is likely to be somewhat sensitive to it so we will go with a low dose.  Severe insomnia is contributing to depression due to early morning awakening.  She is taking Xanax 0.5 mg.  We need to try to find something longer acting that will keep her asleep.,  Therefore, Clonazepam 1 mg HS.  Continue trintellix 20 mg daily she is only been on this dose 9 days and it needs more time.  Re lamotrigine and increase to 150 mg daily.Arman Filter 1.5 mg every other day  Option [place]  Rec counseling  Needs a lot of reassurance   FU 2 weeks  Lynder Parents, MD, DFAPA  Please see After Visit Summary for patient specific instructions.  Lynder Parents, MD, DFAPA   Future Appointments  Date Time Provider [place]  04/12/2022  1:00 PM Cottle, Billey Co., MD CP-CP None     No orders of the defined types were placed in this encounter.      -------------------------------

## 2022-03-21 ENCOUNTER — Ambulatory Visit
Admission: EM | Admit: 2022-03-21 | Discharge: 2022-03-21 | Disposition: A | Discharge status: Home / Self Care | Payer: Medicare Other | Attending: Emergency Medicine | Admitting: Emergency Medicine

## 2022-03-21 ENCOUNTER — Ambulatory Visit: Payer: Medicare Other | Admitting: Family Medicine

## 2022-03-21 DIAGNOSIS — R1030 Lower abdominal pain, unspecified: Secondary | ICD-10-CM | POA: Insufficient documentation

## 2022-03-21 DIAGNOSIS — R030 Elevated blood-pressure reading, without diagnosis of hypertension: Secondary | ICD-10-CM | POA: Diagnosis not present

## 2022-03-21 DIAGNOSIS — R112 Nausea with vomiting, unspecified: Secondary | ICD-10-CM | POA: Diagnosis not present

## 2022-03-21 DIAGNOSIS — R300 Dysuria: Secondary | ICD-10-CM | POA: Diagnosis not present

## 2022-03-21 DIAGNOSIS — R350 Frequency of micturition: Secondary | ICD-10-CM | POA: Insufficient documentation

## 2022-03-21 LAB — POCT URINALYSIS DIP (MANUAL ENTRY)
Bilirubin, UA: NEGATIVE
Glucose, UA: NEGATIVE mg/dL
Ketones, POC UA: NEGATIVE mg/dL
Nitrite, UA: NEGATIVE
Protein Ur, POC: 100 mg/dL — AB
Spec Grav, UA: 1.015 (ref 1.010–1.025)
Urobilinogen, UA: 0.2 E.U./dL
pH, UA: 6.5 (ref 5.0–8.0)

## 2022-03-21 MED ORDER — ONDANSETRON 4 MG PO TBDP: 4.0000 mg | ORAL_TABLET | Freq: Three times a day (TID) | ORAL | 0 refills | Status: DC | PRN

## 2022-03-21 MED ORDER — CEPHALEXIN 500 MG PO CAPS: 500.0000 mg | ORAL_CAPSULE | Freq: Two times a day (BID) | ORAL | 0 refills | Status: AC
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
## 2022-03-22 LAB — URINE CULTURE: Culture: NO GROWTH

## 2022-03-24 ENCOUNTER — Encounter: Payer: Self-pay | Admitting: Psychiatry

## 2022-03-24 ENCOUNTER — Ambulatory Visit (INDEPENDENT_AMBULATORY_CARE_PROVIDER_SITE_OTHER): Payer: Medicare Other | Admitting: Psychiatry

## 2022-03-24 DIAGNOSIS — F411 Generalized anxiety disorder: Secondary | ICD-10-CM

## 2022-03-24 DIAGNOSIS — F5105 Insomnia due to other mental disorder: Secondary | ICD-10-CM

## 2022-03-24 DIAGNOSIS — F332 Major depressive disorder, recurrent severe without psychotic features: Secondary | ICD-10-CM | POA: Diagnosis not present

## 2022-03-24 NOTE — Progress Notes (Signed)
Andrea [name] [id number] [birth year] 71 y.o.  Subjective:   Patient ID:  Andrea [name] is a 71 y.o. (DOB 08/14/1951) female.  Chief Complaint:  Chief Complaint  Patient presents with   Follow-up   Depression   Anxiety    HPI Andrea [name] presents to the office today for follow-up of MDE and anxiety disorder.  seen 09/2019.  No meds were changed.  Been on Lexapro 20 since early 2017.  02/27/2020 phone call from patient: Pt experiencing more anxiety than she normally has. Pt would like to know what are some options that she has as far as her medication. Pt will be in an appt  MD response: If the anxiety is just occasional then using alprazolam as needed would be an okay thing to do.  If it is been fairly persistent for a couple of weeks or more than the simplest solution would be to increase the Lexapro to 1-1/2 tablets daily.  Historically she has done well on Lexapro 20 mg daily for an extended period of time.  If she is under temporary stress then once that is resolved we could drop it back to 1 daily.  If this does not work as expected then she should schedule an earlier appointment. Pt response: Patient called back and she's been having temporary stress, but it's not bad. Things are great, she's working out and feeling okay for the most part. It's been going on for a bit so she agrees to the increase Lexapro 20 mg 1.5 tablets daily. She has been taking alprazolam at hs occasionally to help sleep better and it does help.   12/06/2020 appointment with the following noted:  No covid.  H Covid and recovered.   Had increased Lexapro to 30 mg for 90 days and saw a difference but started seeing weight gain and was doing oK and able to drop back and been OK. Still renovating a house for a year.  Sold her house and mourns the house. Calmed down.  Kept 3 gkids and dogs 10 days.   [place] for new house May 1. Rare alprazolam for HS. Stress moving to condo and would have crying spell over the move  and some problems she was running into dealing with it.  Hard with change.  But taking time to adjust to the idea of moving from her house of 23 years.  Initially refused but he left it up to her and she's come to peace about it.  Andrea [name].   Good response to medication and no SE.  Exercising is good medicine.  If doesn't then doesn't feel as good. Satisfied. Plan: no med changes  08/01/21  appt noted:  seen with [name] died 2023-08-12.  A lot of ups and downs per D.  Hard dips. Got worse when retired and dealing with the new  house.  Had trouble getting rid of things from the old house causing regrets over sellling the house.  D notes doesn't do well with instability  and stressors.  Ruminates on past mistakes. Blamed Andrea [name] for the  difficulty renovating the house.  Neg self talk and beats herself up. When not enough sleep will get depressed.  Does better if busy.   Dreads things.  Patient denies difficulty with sleep initiation or maintenance. Denies appetite disturbance.  Patient reports that energy and motivation have been good. Patient denies any difficulty with concentration.  Patient denies any suicidal ideation.  Plan: Abilify 2.5 mg daily  for a week, then 5 mg daily. Continue Lexapro 20 mg daily  08/31/2021 appt noted: It worked immediately.  Insomnia with EMA. Normally 9 hours.  It's like I'm high.  Not tired.  Average 3-4 hours. Not depressed and no crazy negative thoughts.  Good response.  Energetic but up in middle of night. Plan: Okay to stop Abilify because of insomnia.   09/29/2021 appointment with the following noted: Parties were great.  Sleep problem resolved.  [place] lot of rest at the beach. Maybe the last week or 2 less motivation and socialization.  Not laying in the bed.  Just not as good as she was.  Not as outgoing as normal. No alcohol now.  Only had 1-2 at night.  Not ruminating.   Willing to restart Abilify at lower dose 2 mg daily. 3 grandsons  Plan: Relapse  depression recurs she can resume Abilify  but lower dose 2 mg daily or every other day to try to avoid the insomnia where she can contact our office and we can discussed the possibility of an alternative antidepressant such as Trintellix or duloxetine.  12/28/2021 appointment noted: Several phone calls since she was here.  Abilify plus Lexapro failed.  Switch to duloxetine 90 mg which she started 12/02/2018 2023 Started lamotrigine. Also started Rexulti Sister has had cycling depression that has responded well to lamotrigine with poor response to SSRIs [name] with a little dementia. SE tremor 90 mg duloxetine, ? Wt gain over a few weeks.. Exercise and wt control Social isolation, low self esteem, worry about the way she looks and not usually that way. Crying better with duloxetine.  More dependent on H.   Anhedonia.  Black hole.  Never this low. No SI Having to use Xanax.   Asks about day treatment and [place] Plan: rec TMS Continue lamotrigine as prescribed Increase Rexulti to 1 mg daily Switch to Trintellix:  reduce duloxetine to 2 of the 30 mg capsules and start Trintellix 5 mg daily for 5 days,  Then Increase Trintellix to 10 mg daily and reduce duloxetine to 1 capsule for 1 week. Then stop duloxetine.  02/15/22 appt noted: On Trintellix , Rexulti 1 and lamotrigine I feel good.  Walks 3 miles daily is great for mental health. Keith would not be covered by [facility]. Started feeling better after a week or 2 after the last visit. SE appetite increased. No nausea. Not crying anymore. Sleeping well and no longer ruminates. Plan: Continue Trintellix but DC Rexulti due to weight gain.  Continue lamotrigine  03/06/2022 appointment with the following noted: Last 2 weeks has been very sad and nervous. Trintellix was increased to 20 mg daily  03/10/2022 complaining of ongoing depression, feeling jittery, negative thinking, early morning awakening, ruminating about past decisions.  Wanting to consider [place]  because she is desperate for improvement. Plan we will schedule urgent appointment  03/15/22 urgent appt noted:  seen with Andrea [name] Very depressed, worst ever.  Don't want to be alone, ongoing worry, ruminating on past decisions.  Racing negative thoughts.  No motivation.  Trouble staying asleep.  Getting 4 hours and used to 9 hours. Dread getting up. Using Xanax Increased Trintellix to 20 mg 9 days ago. H agrees sleep is critical. Isolating.   Need to be better by July 11. Plan: Clonazepam 1 mg HS. Continue trintellix 20 mg daily she is only been on this dose 9 days and it needs more time. Re lamotrigine and increase to 150 mg daily.Marland Kitchen  Vraylar 1.5 mg every other day Option TMS  03/24/22 appt noted: So much better.  Thinking straighter and not negative and not sad.  Sleeping better 8-9 hours.. No crying. H notices progressively better every day.  Mind clarity is much better.   No SE noted.  No hangover.  No nausea. Found a therapist, Santiago Bumpers.   Feels well enough to go to [place] and kind of excited.  Back July 16.      Past Psychiatric Medication Trials: Sertraline, fluoxetine, citalopram 50, nefazodone, mirtazapine, Lexapro 30 weight gain, Viibryd 30, Wellbutrin 300, Duloxetine 90 SE tremor Abilify, Rexulti 0.5 methyl folate buspirone,  alprazolam, clonazepam, Under the care of [facility] since January 2001  Sister does well on lamotrigine.  Review of Systems:  Review of Systems  Cardiovascular:  Negative for palpitations.  Gastrointestinal:  Negative for nausea.  Neurological:  Negative for tremors.  Psychiatric/Behavioral:  Positive for dysphoric mood. Negative for agitation, behavioral problems, confusion, decreased concentration, hallucinations, self-injury, sleep disturbance and suicidal ideas. The patient is nervous/anxious. The patient is not hyperactive.     Medications: I have reviewed the patient's current medications.  Current  Outpatient Medications  Medication Sig Dispense Refill   calcium citrate-vitamin D (CITRACAL+D) 315-200 MG-UNIT tablet Take by mouth.     cariprazine (VRAYLAR) 1.5 MG capsule Take by mouth. Every other day     clonazePAM (KLONOPIN) 1 MG tablet Take 1 tablet (1 mg total) by mouth at bedtime. 30 tablet 0   fluticasone (FLONASE) 50 MCG/ACT nasal spray fluticasone propionate 50 mcg/actuation nasal spray,suspension     lamoTRIgine (LAMICTAL) 150 MG tablet Take 1 tablet (150 mg total) by mouth daily. 90 tablet 1   levothyroxine (SYNTHROID, LEVOTHROID) 50 MCG tablet Take 50 mcg by mouth daily before breakfast.     ondansetron (ZOFRAN-ODT) 4 MG disintegrating tablet Take 1 tablet (4 mg total) by mouth every 8 (eight) hours as needed for nausea or vomiting. 20 tablet 0   raloxifene (EVISTA) 60 MG tablet raloxifene 60 mg tablet     vortioxetine HBr (TRINTELLIX) 20 MG TABS tablet Take 1 tablet (20 mg total) by mouth daily. 30 tablet 0   XIIDRA 5 % SOLN      ALPRAZolam (XANAX) 0.5 MG tablet Take 1 tablet (0.5 mg total) by mouth at bedtime as needed for anxiety. (Patient not taking: Reported on 03/24/2022) 30 tablet 1   atorvastatin (LIPITOR) 20 MG tablet Take 20 mg by mouth.      cephALEXin (KEFLEX) 500 MG capsule Take 1 capsule (500 mg total) by mouth 2 (two) times daily for 5 days. (Patient not taking: Reported on 03/24/2022) 10 capsule 0   No current facility-administered medications for this visit.    Medication Side Effects: None  Allergies:  Allergies  Allergen Reactions   Hydrocodone-Acetaminophen Other (See Comments)    Passes out   Morphine Other (See Comments)    Passes out    Past Medical History:  Diagnosis Date   GAD (generalized anxiety disorder)    Hyperlipidemia    Hypothyroidism    Osteopenia    Sinusitis     Family History  Problem Relation Age of Onset   Arthritis Mother    COPD Mother    Hypercholesterolemia Mother    Dementia Mother    Stroke Mother    Prostate  cancer Brother    Cancer Maternal Grandmother        Oral    Social History   Socioeconomic History  Marital status: Married    Spouse name: Not on file   Number of children: Not on file   Years of education: Not on file   Highest education level: Not on file  Occupational History   Not on file  Tobacco Use   Smoking status: Never   Smokeless tobacco: Never  Vaping Use   Vaping Use: Never used  Substance and Sexual Activity   Alcohol use: Not Currently    Comment: social   Drug use: Never   Sexual activity: Not on file  Other Topics Concern   Not on file  Social History Narrative   Married.   1 child. 2 grandchildren.   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   Enjoys exercising, spending time with family   Social Determinants of Health   Financial Resource Strain: Low Risk  (02/24/2022)   Overall Financial Resource Strain (CARDIA)    Difficulty of Paying Living Expenses: Not hard at all  Food Insecurity: No Food Insecurity (02/24/2022)   Hunger Vital Sign    Worried About Running Out of Food in the Last Year: Never true    Ran Out of Food in the Last Year: Never true  Transportation Needs: No Transportation Needs (02/24/2022)   PRAPARE - Hydrologist (Medical): No    Lack of Transportation (Non-Medical): No  Physical Activity: Sufficiently Active (02/24/2022)   Exercise Vital Sign    Days of Exercise per Week: 5 days    Minutes of Exercise per Session: 90 min  Stress: No Stress Concern Present (02/24/2022)   [place]    Feeling of Stress : Not at all  Social Connections: Not on file  Intimate Partner Violence: Not on file    Past Medical History, Surgical history, Social history, and Family history were reviewed and updated as appropriate.   3 grandsons.  Please see review of systems for further details on the patient's review from today.   Objective:   Physical  Exam:  There were no vitals taken for this visit.  Physical Exam Constitutional:      General: She is not in acute distress.    Appearance: She is well-developed.  Musculoskeletal:        General: No deformity.  Neurological:     Mental Status: She is alert and oriented to person, place, and time.     Motor: No tremor.     Coordination: Coordination normal.     Gait: Gait normal.  Psychiatric:        Attention and Perception: She is attentive. She does not perceive auditory hallucinations.        Mood and Affect: Mood is anxious and depressed. Affect is not labile, blunt, angry, tearful or inappropriate.        Speech: Speech normal.        Behavior: Behavior normal.        Thought Content: Thought content normal. Thought content is not delusional. Thought content does not include homicidal or suicidal ideation. Thought content does not include suicidal plan.        Cognition and Memory: Cognition and memory normal.        Judgment: Judgment normal.     Comments: Insight intact. No auditory or visual hallucinations. No delusions.  Dramatically better mood and anxiety since adding Vraylar in the last 9 days.  Not 100%     Lab Review:  No results found for: "NA", "  K", "CL", "CO2", "GLUCOSE", "BUN", "CREATININE", "CALCIUM", "PROT", "ALBUMIN", "AST", "ALT", "ALKPHOS", "BILITOT", "GFRNONAA", "GFRAA"  No results found for: "WBC", "RBC", "HGB", "HCT", "PLT", "MCV", "MCH", "MCHC", "RDW", "LYMPHSABS", "MONOABS", "EOSABS", "BASOSABS"  No results found for: "POCLITH", "LITHIUM"   No results found for: "PHENYTOIN", "PHENOBARB", "VALPROATE", "CBMZ"   .res Assessment: Plan:    Severe episode of recurrent major depressive disorder, without psychotic features ([place])  Generalized anxiety disorder  Insomnia due to mental condition   Greater than 50% of 30-minute face to face time with patient was spent on counseling and coordination of care.  Recent worsening mood as noted before.  Dramatically better mood and anxiety since adding Vraylar and increasing Tritellix  in the last 9 days.  Not 100% Sister has had cycling depression that has responded well to lamotrigine with poor response to SSRIs  Hx depression responded very well to Abilify with complete remission within a few days.  The anhedonia irritability and negative thoughts resolved completely.  Her interest in things and social interest in particular improved dramatically.  However she had significant insomnia as a side effect and then relapsed with worst depression ever.  She then responded to Rexulti 1 mg daily however had weight gain and wanted to stop that.  Once stopping it she became depressed significantly within a week or 2.  She now describes this is the worst depression ever.  She has a lot of anxiety. .  She has a wedding she has to attend on July 11 and she is desperate to feel better because she can go in her current state.   Discussed potential metabolic side effects associated with atypical antipsychotics, as well as potential risk for movement side effects. Advised pt to contact office if movement side effects occur.   We discussed the short-term risks associated with benzodiazepines including sedation and increased fall risk among others.  Discussed long-term side effect risk including dependence, potential withdrawal symptoms, and the potential eventual dose-related risk of dementia.  But recent studies from 2020 dispute this association between benzodiazepines and dementia risk. Newer studies in 2020 do not support an association with dementia.  Clonazepam 1 mg HS. This resolved insomnia without SE  Continue trintellix 20 mg daily she is only been on this dose 20 days and it needs more time.  Re lamotriginecontinue 150 mg daily.Arman Filter 1.5 mg every other day  Option [place]  Rec counseling .  She has a scheduled appt.  FU July  Lynder Parents, MD, DFAPA  Please see After Visit Summary for  patient specific instructions.  Lynder Parents, MD, DFAPA   Future Appointments  Date Time Provider [place]  04/12/2022  1:00 PM Cottle, Billey Co., MD [facility] None     No orders of the defined types were placed in this encounter.      -------------------------------

## 2022-04-03 NOTE — Progress Notes (Signed)
cancelled    This encounter was created in error - please disregard.

## 2022-04-11 DIAGNOSIS — F331 Major depressive disorder, recurrent, moderate: Secondary | ICD-10-CM | POA: Diagnosis not present

## 2022-04-12 ENCOUNTER — Encounter: Payer: Self-pay | Admitting: Psychiatry

## 2022-04-12 ENCOUNTER — Ambulatory Visit (INDEPENDENT_AMBULATORY_CARE_PROVIDER_SITE_OTHER): Payer: Medicare Other | Admitting: Psychiatry

## 2022-04-12 VITALS — BP 142/85 | HR 73

## 2022-04-12 DIAGNOSIS — F332 Major depressive disorder, recurrent severe without psychotic features: Secondary | ICD-10-CM

## 2022-04-12 DIAGNOSIS — F5105 Insomnia due to other mental disorder: Secondary | ICD-10-CM | POA: Diagnosis not present

## 2022-04-12 DIAGNOSIS — F411 Generalized anxiety disorder: Secondary | ICD-10-CM

## 2022-04-12 DIAGNOSIS — F3341 Major depressive disorder, recurrent, in partial remission: Secondary | ICD-10-CM | POA: Diagnosis not present

## 2022-04-12 MED ORDER — CLONAZEPAM 0.5 MG PO TABS: 0.5000 mg | ORAL_TABLET | Freq: Every day | ORAL | 1 refills | Status: DC

## 2022-04-12 MED ORDER — CARIPRAZINE HCL 1.5 MG PO CAPS: 1.5000 mg | ORAL_CAPSULE | Freq: Every day | ORAL | 1 refills | Status: DC

## 2022-04-12 MED ORDER — VORTIOXETINE HBR 20 MG PO TABS: 20.0000 mg | ORAL_TABLET | Freq: Every day | ORAL | 2 refills | Status: DC

## 2022-04-12 NOTE — Progress Notes (Signed)
Andrea [name] [id number] [birth year] 71 y.o.  Subjective:   Patient ID:  Andrea [name] is a 71 y.o. (DOB 08/14/1951) female.  Chief Complaint:  Chief Complaint  Patient presents with   Follow-up   Depression   Anxiety    HPI Andrea [name] presents to the office today for follow-up of MDE and anxiety disorder.  seen 09/2019.  No meds were changed.  Been on Lexapro 20 since early 2017.  02/27/2020 phone call from patient: Pt experiencing more anxiety than she normally has. Pt would like to know what are some options that she has as far as her medication. Pt will be in an appt  MD response: If the anxiety is just occasional then using alprazolam as needed would be an okay thing to do.  If it is been fairly persistent for a couple of weeks or more than the simplest solution would be to increase the Lexapro to 1-1/2 tablets daily.  Historically she has done well on Lexapro 20 mg daily for an extended period of time.  If she is under temporary stress then once that is resolved we could drop it back to 1 daily.  If this does not work as expected then she should schedule an earlier appointment. Pt response: Patient called back and she's been having temporary stress, but it's not bad. Things are great, she's working out and feeling okay for the most part. It's been going on for a bit so she agrees to the increase Lexapro 20 mg 1.5 tablets daily. She has been taking alprazolam at hs occasionally to help sleep better and it does help.   12/06/2020 appointment with the following noted:  No covid.  H Covid and recovered.   Had increased Lexapro to 30 mg for 90 days and saw a difference but started seeing weight gain and was doing oK and able to drop back and been OK. Still renovating a house for a year.  Sold her house and mourns the house. Calmed down.  Kept 3 gkids and dogs 10 days.   [place] for new house May 1. Rare alprazolam for HS. Stress moving to condo and would have crying spell over the move  and some problems she was running into dealing with it.  Hard with change.  But taking time to adjust to the idea of moving from her house of 23 years.  Initially refused but he left it up to her and she's come to peace about it.  Andrea [name].   Good response to medication and no SE.  Exercising is good medicine.  If doesn't then doesn't feel as good. Satisfied. Plan: no med changes  08/01/21  appt noted:  seen with [name] died 2023-08-12.  A lot of ups and downs per D.  Hard dips. Got worse when retired and dealing with the new  house.  Had trouble getting rid of things from the old house causing regrets over sellling the house.  D notes doesn't do well with instability  and stressors.  Ruminates on past mistakes. Blamed Andrea [name] for the  difficulty renovating the house.  Neg self talk and beats herself up. When not enough sleep will get depressed.  Does better if busy.   Dreads things.  Patient denies difficulty with sleep initiation or maintenance. Denies appetite disturbance.  Patient reports that energy and motivation have been good. Patient denies any difficulty with concentration.  Patient denies any suicidal ideation.  Plan: Abilify 2.5 mg daily  for a week, then 5 mg daily. Continue Lexapro 20 mg daily  08/31/2021 appt noted: It worked immediately.  Insomnia with EMA. Normally 9 hours.  It's like I'm high.  Not tired.  Average 3-4 hours. Not depressed and no crazy negative thoughts.  Good response.  Energetic but up in middle of night. Plan: Okay to stop Abilify because of insomnia.   09/29/2021 appointment with the following noted: Parties were great.  Sleep problem resolved.  [place] lot of rest at the beach. Maybe the last week or 2 less motivation and socialization.  Not laying in the bed.  Just not as good as she was.  Not as outgoing as normal. No alcohol now.  Only had 1-2 at night.  Not ruminating.   Willing to restart Abilify at lower dose 2 mg daily. 3 grandsons  Plan: Relapse  depression recurs she can resume Abilify  but lower dose 2 mg daily or every other day to try to avoid the insomnia where she can contact our office and we can discussed the possibility of an alternative antidepressant such as Trintellix or duloxetine.  12/28/2021 appointment noted: Several phone calls since she was here.  Abilify plus Lexapro failed.  Switch to duloxetine 90 mg which she started 12/02/2018 2023 Started lamotrigine. Also started Rexulti Sister has had cycling depression that has responded well to lamotrigine with poor response to SSRIs [name] with a little dementia. SE tremor 90 mg duloxetine, ? Wt gain over a few weeks.. Exercise and wt control Social isolation, low self esteem, worry about the way she looks and not usually that way. Crying better with duloxetine.  More dependent on H.   Anhedonia.  Black hole.  Never this low. No SI Having to use Xanax.   Asks about day treatment and [place] Plan: rec TMS Continue lamotrigine as prescribed Increase Rexulti to 1 mg daily Switch to Trintellix:  reduce duloxetine to 2 of the 30 mg capsules and start Trintellix 5 mg daily for 5 days,  Then Increase Trintellix to 10 mg daily and reduce duloxetine to 1 capsule for 1 week. Then stop duloxetine.  02/15/22 appt noted: On Trintellix , Rexulti 1 and lamotrigine I feel good.  Walks 3 miles daily is great for mental health. Keith would not be covered by [facility]. Started feeling better after a week or 2 after the last visit. SE appetite increased. No nausea. Not crying anymore. Sleeping well and no longer ruminates. Plan: Continue Trintellix but DC Rexulti due to weight gain.  Continue lamotrigine  03/06/2022 appointment with the following noted: Last 2 weeks has been very sad and nervous. Trintellix was increased to 20 mg daily  03/10/2022 complaining of ongoing depression, feeling jittery, negative thinking, early morning awakening, ruminating about past decisions.  Wanting to consider [place]  because she is desperate for improvement. Plan we will schedule urgent appointment  03/15/22 urgent appt noted:  seen with Andrea [name] Very depressed, worst ever.  Don't want to be alone, ongoing worry, ruminating on past decisions.  Racing negative thoughts.  No motivation.  Trouble staying asleep.  Getting 4 hours and used to 9 hours. Dread getting up. Using Xanax Increased Trintellix to 20 mg 9 days ago. H agrees sleep is critical. Isolating.   Need to be better by July 11. Plan: Clonazepam 1 mg HS. Continue trintellix 20 mg daily she is only been on this dose 9 days and it needs more time. Re lamotrigine and increase to 150 mg daily.Marland Kitchen  Vraylar 1.5 mg every other day Option TMS  03/24/22 appt noted: So much better.  Thinking straighter and not negative and not sad.  Sleeping better 8-9 hours.. No crying. H notices progressively better every day.  Mind clarity is much better.   No SE noted.  No hangover.  No nausea. Found a therapist, Santiago Bumpers.   Feels well enough to go to [place] and kind of excited.  Back July 16.   Plan: Clonazepam 1 mg HS. This resolved insomnia without SE Continue trintellix 20 mg daily she is only been on this dose 20 days and it needs more time. Re lamotriginecontinue 150 mg daily.Arman Filter 1.5 mg every other day Option [place]  04/12/22 appt noted: Great.  Went to [place].  Was herself and enjoyed it.  Anxiety was managed.   [facility] and liked her. 3# wt gain.   Patient reports stable mood and denies depressed or irritable moods.  Patient denies any recent difficulty with anxiety.  Patient denies difficulty with sleep initiation or maintenance. Denies appetite disturbance.  Patient reports that energy and motivation have been good.  Patient denies any difficulty with concentration.  Patient denies any suicidal ideation. Sleeping well than not anxious.   Past Psychiatric Medication Trials: Sertraline, fluoxetine, citalopram 50,  nefazodone, mirtazapine, Lexapro 30 weight gain, Viibryd 30, Wellbutrin 300, Duloxetine 90 SE tremor Abilify, Rexulti 0.5 methyl folate buspirone,  alprazolam, clonazepam, Under the care of [facility] since January 2001  Sister does well on lamotrigine.  Review of Systems:  Review of Systems  Cardiovascular:  Negative for palpitations.  Gastrointestinal:  Negative for nausea.  Neurological:  Negative for tremors.  Psychiatric/Behavioral:  Positive for dysphoric mood. Negative for behavioral problems, confusion, decreased concentration, hallucinations, self-injury, sleep disturbance and suicidal ideas. The patient is nervous/anxious. The patient is not hyperactive.     Medications: I have reviewed the patient's current medications.  Current Outpatient Medications  Medication Sig Dispense Refill   atorvastatin (LIPITOR) 20 MG tablet Take 20 mg by mouth.      calcium citrate-vitamin D (CITRACAL+D) 315-200 MG-UNIT tablet Take by mouth.     fluticasone (FLONASE) 50 MCG/ACT nasal spray fluticasone propionate 50 mcg/actuation nasal spray,suspension     lamoTRIgine (LAMICTAL) 150 MG tablet Take 1 tablet (150 mg total) by mouth daily. 90 tablet 1   levothyroxine (SYNTHROID, LEVOTHROID) 50 MCG tablet Take 50 mcg by mouth daily before breakfast.     ondansetron (ZOFRAN-ODT) 4 MG disintegrating tablet Take 1 tablet (4 mg total) by mouth every 8 (eight) hours as needed for nausea or vomiting. 20 tablet 0   raloxifene (EVISTA) 60 MG tablet raloxifene 60 mg tablet     XIIDRA 5 % SOLN      cariprazine (VRAYLAR) 1.5 MG capsule Take 1 capsule (1.5 mg total) by mouth daily. 30 capsule 1   clonazePAM (KLONOPIN) 0.5 MG tablet Take 1-2 tablets (0.5-1 mg total) by mouth at bedtime. 45 tablet 1   vortioxetine HBr (TRINTELLIX) 20 MG TABS tablet Take 1 tablet (20 mg total) by mouth daily. 30 tablet 2   No current facility-administered medications for this visit.    Medication Side  Effects: None  Allergies:  Allergies  Allergen Reactions   Hydrocodone-Acetaminophen Other (See Comments)    Passes out   Morphine Other (See Comments)    Passes out    Past Medical History:  Diagnosis Date   GAD (generalized anxiety disorder)    Hyperlipidemia  Hypothyroidism    Osteopenia    Sinusitis     Family History  Problem Relation Age of Onset   Arthritis Mother    COPD Mother    Hypercholesterolemia Mother    Dementia Mother    Stroke Mother    Prostate cancer Brother    Cancer Maternal Grandmother        Oral    Social History   Socioeconomic History   Marital status: Married    Spouse name: Not on file   Number of children: Not on file   Years of education: Not on file   Highest education level: Not on file  Occupational History   Not on file  Tobacco Use   Smoking status: Never   Smokeless tobacco: Never  Vaping Use   Vaping Use: Never used  Substance and Sexual Activity   Alcohol use: Not Currently    Comment: social   Drug use: Never   Sexual activity: Not on file  Other Topics Concern   Not on file  Social History Narrative   Married.   1 child. 2 grandchildren.   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   Enjoys exercising, spending time with family   Social Determinants of Health   Financial Resource Strain: Low Risk  (02/24/2022)   Overall Financial Resource Strain (CARDIA)    Difficulty of Paying Living Expenses: Not hard at all  Food Insecurity: No Food Insecurity (02/24/2022)   Hunger Vital Sign    Worried About Running Out of Food in the Last Year: Never true    Ran Out of Food in the Last Year: Never true  Transportation Needs: No Transportation Needs (02/24/2022)   PRAPARE - Hydrologist (Medical): No    Lack of Transportation (Non-Medical): No  Physical Activity: Sufficiently Active (02/24/2022)   Exercise Vital Sign    Days of Exercise per Week: 5 days    Minutes of Exercise per Session: 90  min  Stress: No Stress Concern Present (02/24/2022)   [place]    Feeling of Stress : Not at all  Social Connections: Not on file  Intimate Partner Violence: Not on file    Past Medical History, Surgical history, Social history, and Family history were reviewed and updated as appropriate.   3 grandsons.  Please see review of systems for further details on the patient's review from today.   Objective:   Physical Exam:  BP (!) 142/85   Pulse 73   Physical Exam Constitutional:      General: She is not in acute distress.    Appearance: She is well-developed.  Musculoskeletal:        General: No deformity.  Neurological:     Mental Status: She is alert and oriented to person, place, and time.     Motor: No tremor.     Coordination: Coordination normal.     Gait: Gait normal.  Psychiatric:        Attention and Perception: She is attentive. She does not perceive auditory hallucinations.        Mood and Affect: Mood is anxious and depressed. Affect is not labile, blunt, angry, tearful or inappropriate.        Speech: Speech normal.        Behavior: Behavior normal.        Thought Content: Thought content normal. Thought content is not delusional. Thought content does not include homicidal or  suicidal ideation. Thought content does not include suicidal plan.        Cognition and Memory: Cognition and memory normal.        Judgment: Judgment normal.     Comments: Insight intact. No auditory or visual hallucinations. No delusions.  Dramatically better mood and anxiety since adding Vraylar      Lab Review:  No results found for: "NA", "K", "CL", "CO2", "GLUCOSE", "BUN", "CREATININE", "CALCIUM", "PROT", "ALBUMIN", "AST", "ALT", "ALKPHOS", "BILITOT", "GFRNONAA", "GFRAA"  No results found for: "WBC", "RBC", "HGB", "HCT", "PLT", "MCV", "MCH", "MCHC", "RDW", "LYMPHSABS", "MONOABS", "EOSABS", "BASOSABS"  No results  found for: "POCLITH", "LITHIUM"   No results found for: "PHENYTOIN", "PHENOBARB", "VALPROATE", "CBMZ"   .res Assessment: Plan:    Severe episode of recurrent major depressive disorder, without psychotic features ([place]) - Plan: cariprazine (VRAYLAR) 1.5 MG capsule, vortioxetine HBr (TRINTELLIX) 20 MG TABS tablet  Generalized anxiety disorder  Insomnia due to mental condition - Plan: clonazePAM (KLONOPIN) 0.5 MG tablet  Recurrent major depression in partial remission ([place])   Greater than 50% of 30-minute face to face time with patient was spent on counseling and coordination of care.  Recent worsening mood as noted before. Dramatically better mood and anxiety since adding Vraylar and increasing Tritellix   Sister has had cycling depression that has responded well to lamotrigine with poor response to SSRIs  Disc pros and cons of trying to reduce Vraylar now vs later.  She wants to defer DT no SE  Discussed potential metabolic side effects associated with atypical antipsychotics, as well as potential risk for movement side effects. Advised pt to contact office if movement side effects occur.   We discussed the short-term risks associated with benzodiazepines including sedation and increased fall risk among others.  Discussed long-term side effect risk including dependence, potential withdrawal symptoms, and the potential eventual dose-related risk of dementia.  But recent studies from 2020 dispute this association between benzodiazepines and dementia risk. Newer studies in 2020 do not support an association with dementia.  Reduce Clonazepam 1/2 -1 mg HS. This resolved insomnia without SE  Continue trintellix 20 mg daily she is only been on this dose 20 days and it needs more time.  Re lamotrigine continue 150 mg daily.Arman Filter 1.5 mg every other day  Rec counseling .  She has a scheduled appt.  FU next available  Lynder Parents, MD, DFAPA  Please see After Visit Summary for patient  specific instructions.  Lynder Parents, MD, DFAPA   No future appointments.    No orders of the defined types were placed in this encounter.      -------------------------------

## 2022-04-14 DIAGNOSIS — D2261 Melanocytic nevi of right upper limb, including shoulder: Secondary | ICD-10-CM | POA: Diagnosis not present

## 2022-04-14 DIAGNOSIS — L821 Other seborrheic keratosis: Secondary | ICD-10-CM | POA: Diagnosis not present

## 2022-04-14 DIAGNOSIS — D2272 Melanocytic nevi of left lower limb, including hip: Secondary | ICD-10-CM | POA: Diagnosis not present

## 2022-04-14 DIAGNOSIS — L814 Other melanin hyperpigmentation: Secondary | ICD-10-CM | POA: Diagnosis not present

## 2022-04-14 DIAGNOSIS — Z85828 Personal history of other malignant neoplasm of skin: Secondary | ICD-10-CM | POA: Diagnosis not present

## 2022-04-14 DIAGNOSIS — X32XXXA Exposure to sunlight, initial encounter: Secondary | ICD-10-CM | POA: Diagnosis not present

## 2022-04-14 DIAGNOSIS — D2262 Melanocytic nevi of left upper limb, including shoulder: Secondary | ICD-10-CM | POA: Diagnosis not present

## 2022-04-24 DIAGNOSIS — F331 Major depressive disorder, recurrent, moderate: Secondary | ICD-10-CM | POA: Diagnosis not present

## 2022-05-09 DIAGNOSIS — F331 Major depressive disorder, recurrent, moderate: Secondary | ICD-10-CM | POA: Diagnosis not present

## 2022-05-31 DIAGNOSIS — M9905 Segmental and somatic dysfunction of pelvic region: Secondary | ICD-10-CM | POA: Diagnosis not present

## 2022-05-31 DIAGNOSIS — M5137 Other intervertebral disc degeneration, lumbosacral region: Secondary | ICD-10-CM | POA: Diagnosis not present

## 2022-05-31 DIAGNOSIS — M955 Acquired deformity of pelvis: Secondary | ICD-10-CM | POA: Diagnosis not present

## 2022-05-31 DIAGNOSIS — M5136 Other intervertebral disc degeneration, lumbar region: Secondary | ICD-10-CM | POA: Diagnosis not present

## 2022-05-31 DIAGNOSIS — M5441 Lumbago with sciatica, right side: Secondary | ICD-10-CM | POA: Diagnosis not present

## 2022-05-31 DIAGNOSIS — M7918 Myalgia, other site: Secondary | ICD-10-CM | POA: Diagnosis not present

## 2022-05-31 DIAGNOSIS — M9904 Segmental and somatic dysfunction of sacral region: Secondary | ICD-10-CM | POA: Diagnosis not present

## 2022-05-31 DIAGNOSIS — M9903 Segmental and somatic dysfunction of lumbar region: Secondary | ICD-10-CM | POA: Diagnosis not present

## 2022-05-31 DIAGNOSIS — M5442 Lumbago with sciatica, left side: Secondary | ICD-10-CM | POA: Diagnosis not present

## 2022-05-31 DIAGNOSIS — M62830 Muscle spasm of back: Secondary | ICD-10-CM | POA: Diagnosis not present

## 2022-06-01 DIAGNOSIS — M9903 Segmental and somatic dysfunction of lumbar region: Secondary | ICD-10-CM | POA: Diagnosis not present

## 2022-06-01 DIAGNOSIS — M9905 Segmental and somatic dysfunction of pelvic region: Secondary | ICD-10-CM | POA: Diagnosis not present

## 2022-06-01 DIAGNOSIS — M955 Acquired deformity of pelvis: Secondary | ICD-10-CM | POA: Diagnosis not present

## 2022-06-01 DIAGNOSIS — M62830 Muscle spasm of back: Secondary | ICD-10-CM | POA: Diagnosis not present

## 2022-06-05 DIAGNOSIS — M62830 Muscle spasm of back: Secondary | ICD-10-CM | POA: Diagnosis not present

## 2022-06-05 DIAGNOSIS — M9905 Segmental and somatic dysfunction of pelvic region: Secondary | ICD-10-CM | POA: Diagnosis not present

## 2022-06-05 DIAGNOSIS — M9903 Segmental and somatic dysfunction of lumbar region: Secondary | ICD-10-CM | POA: Diagnosis not present

## 2022-06-05 DIAGNOSIS — M955 Acquired deformity of pelvis: Secondary | ICD-10-CM | POA: Diagnosis not present

## 2022-06-07 DIAGNOSIS — M9903 Segmental and somatic dysfunction of lumbar region: Secondary | ICD-10-CM | POA: Diagnosis not present

## 2022-06-07 DIAGNOSIS — M62830 Muscle spasm of back: Secondary | ICD-10-CM | POA: Diagnosis not present

## 2022-06-07 DIAGNOSIS — M955 Acquired deformity of pelvis: Secondary | ICD-10-CM | POA: Diagnosis not present

## 2022-06-07 DIAGNOSIS — M9905 Segmental and somatic dysfunction of pelvic region: Secondary | ICD-10-CM | POA: Diagnosis not present

## 2022-06-17 ENCOUNTER — Other Ambulatory Visit: Payer: Self-pay | Admitting: Psychiatry

## 2022-06-17 DIAGNOSIS — F332 Major depressive disorder, recurrent severe without psychotic features: Secondary | ICD-10-CM

## 2022-06-29 ENCOUNTER — Ambulatory Visit: Payer: Medicare Other | Admitting: Psychiatry

## 2022-07-09 ENCOUNTER — Other Ambulatory Visit: Payer: Self-pay | Admitting: Psychiatry

## 2022-07-09 DIAGNOSIS — F332 Major depressive disorder, recurrent severe without psychotic features: Secondary | ICD-10-CM

## 2022-07-10 ENCOUNTER — Telehealth: Payer: Self-pay

## 2022-07-12 ENCOUNTER — Other Ambulatory Visit: Payer: Self-pay | Admitting: Psychiatry

## 2022-07-12 DIAGNOSIS — F5105 Insomnia due to other mental disorder: Secondary | ICD-10-CM

## 2022-07-12 NOTE — Telephone Encounter (Signed)
Filled 8/27 appt 11/21

## 2022-07-12 NOTE — Telephone Encounter (Signed)
Patient notified of recommendations. 

## 2022-07-12 NOTE — Telephone Encounter (Signed)
The weight gain is probably from the [place].  Tell her to stop it.  It will take a couple of weeks for it to get out of her system. For the depression for now however increase lamotrigine to 1-1/2 of the 150 mg tablets.  That does not have any risk of weight gain.  If that does not work out well enough for the depression I have other options that we can discuss.

## 2022-07-12 NOTE — Telephone Encounter (Signed)
Pt called checking on the status of the refill. She took the last pill last night and will need more tonight. Please fill her klonopin

## 2022-08-01 ENCOUNTER — Telehealth: Payer: Self-pay

## 2022-08-01 NOTE — Telephone Encounter (Signed)
Send in RX of increase lamotrigine to 200 mg tab, 1 daily.  Increase Vraylar 1.5 mg to 1 daily instead of every other day. She has appt in 2 weeks and if she's not markedly better we will stop the ineffective meds and switch to something else.

## 2022-08-02 ENCOUNTER — Other Ambulatory Visit: Payer: Self-pay

## 2022-08-02 DIAGNOSIS — F3341 Major depressive disorder, recurrent, in partial remission: Secondary | ICD-10-CM

## 2022-08-02 MED ORDER — LAMOTRIGINE 200 MG PO TABS: 200.0000 mg | ORAL_TABLET | Freq: Every day | ORAL | 1 refills | Status: DC

## 2022-08-02 NOTE — Telephone Encounter (Signed)
Rx sent LVM to rtc

## 2022-08-02 NOTE — Telephone Encounter (Signed)
Pt informed

## 2022-08-09 DIAGNOSIS — M9905 Segmental and somatic dysfunction of pelvic region: Secondary | ICD-10-CM | POA: Diagnosis not present

## 2022-08-09 DIAGNOSIS — M62830 Muscle spasm of back: Secondary | ICD-10-CM | POA: Diagnosis not present

## 2022-08-09 DIAGNOSIS — M9903 Segmental and somatic dysfunction of lumbar region: Secondary | ICD-10-CM | POA: Diagnosis not present

## 2022-08-09 DIAGNOSIS — M955 Acquired deformity of pelvis: Secondary | ICD-10-CM | POA: Diagnosis not present

## 2022-08-11 ENCOUNTER — Ambulatory Visit
Admission: EM | Admit: 2022-08-11 | Discharge: 2022-08-11 | Disposition: A | Discharge status: Home / Self Care | Payer: Medicare Other | Attending: Emergency Medicine | Admitting: Emergency Medicine

## 2022-08-11 DIAGNOSIS — R350 Frequency of micturition: Secondary | ICD-10-CM | POA: Diagnosis not present

## 2022-08-11 LAB — POCT URINALYSIS DIP (MANUAL ENTRY)
Bilirubin, UA: NEGATIVE
Blood, UA: NEGATIVE
Glucose, UA: NEGATIVE mg/dL
Ketones, POC UA: NEGATIVE mg/dL
Leukocytes, UA: NEGATIVE
Nitrite, UA: NEGATIVE
Protein Ur, POC: NEGATIVE mg/dL
Spec Grav, UA: <=1.005 — AB (ref 1.010–1.025)
Urobilinogen, UA: 0.2 E.U./dL
pH, UA: 6 (ref 5.0–8.0)

## 2022-08-11 NOTE — ED Triage Notes (Signed)
Patient to Urgent Care with complaints of urinary frequency and urgency x4 days. Reports some discomfort in the right side of her groin.   Denies any known fevers. Denies any vaginal discharge.

## 2022-08-11 NOTE — Discharge Instructions (Addendum)
Follow up with your primary care provider if your symptoms are not improving.     
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
## 2022-08-15 ENCOUNTER — Ambulatory Visit (INDEPENDENT_AMBULATORY_CARE_PROVIDER_SITE_OTHER): Payer: Medicare Other | Admitting: Psychiatry

## 2022-08-15 ENCOUNTER — Encounter: Payer: Self-pay | Admitting: Psychiatry

## 2022-08-15 DIAGNOSIS — F411 Generalized anxiety disorder: Secondary | ICD-10-CM | POA: Diagnosis not present

## 2022-08-15 DIAGNOSIS — F332 Major depressive disorder, recurrent severe without psychotic features: Secondary | ICD-10-CM

## 2022-08-15 DIAGNOSIS — F3341 Major depressive disorder, recurrent, in partial remission: Secondary | ICD-10-CM | POA: Diagnosis not present

## 2022-08-15 DIAGNOSIS — F5105 Insomnia due to other mental disorder: Secondary | ICD-10-CM | POA: Diagnosis not present

## 2022-08-15 MED ORDER — CLONAZEPAM 0.5 MG PO TABS: 0.5000 mg | ORAL_TABLET | Freq: Every day | ORAL | 1 refills | Status: DC

## 2022-08-15 NOTE — Patient Instructions (Signed)
Stop Vraylar After Thanksgiving stop Trintellix Wait 3 days then Start Auvelity 1 in the morning for 1 week,  and if no side effects then increase Auvelity to 1 in the AM and 1 in the PM

## 2022-08-15 NOTE — Progress Notes (Signed)
Andrea [name] [id number] 1951-07-30 71 y.o.  Subjective:   Patient ID:  Andrea [name] is a 71 y.o. (DOB [birth year]) female.  Chief Complaint:  Chief Complaint  Patient presents with   Follow-up   Depression   Anxiety   Family Problem    HPI Andrea [name] presents to the office today for follow-up of MDE and anxiety disorder.  seen 09/2019.  No meds were changed.  Been on Lexapro 20 since early 2017.  02/27/2020 phone call from patient: Pt experiencing more anxiety than she normally has. Pt would like to know what are some options that she has as far as her medication. Pt will be in an appt  MD response: If the anxiety is just occasional then using alprazolam as needed would be an okay thing to do.  If it is been fairly persistent for a couple of weeks or more than the simplest solution would be to increase the Lexapro to 1-1/2 tablets daily.  Historically she has done well on Lexapro 20 mg daily for an extended period of time.  If she is under temporary stress then once that is resolved we could drop it back to 1 daily.  If this does not work as expected then she should schedule an earlier appointment. Pt response: Patient called back and she's been having temporary stress, but it's not bad. Things are great, she's working out and feeling okay for the most part. It's been going on for a bit so she agrees to the increase Lexapro 20 mg 1.5 tablets daily. She has been taking alprazolam at hs occasionally to help sleep better and it does help.   12/06/2020 appointment with the following noted:  No covid.  Andrea [name] Covid and recovered.   Had increased Lexapro to 30 mg for 90 days and saw a difference but started seeing weight gain and was doing oK and able to drop back and been OK. Still renovating a house for a year.  Sold her house and mourns the house. Calmed down.  Kept 3 gkids and dogs 10 days.   Andrea [name] for new house May 1. Rare alprazolam for HS. Stress moving to condo and would have crying  spell over the move and some problems she was running into dealing with it.  Hard with change.  But taking time to adjust to the idea of moving from her house of 23 years.  Initially refused but he left it up to her and she's come to peace about it.  Andrea [name].   Good response to medication and no SE.  Exercising is good medicine.  If doesn't then doesn't feel as good. Satisfied. Plan: no med changes  08/01/21  appt noted:  seen with Andrea [name] died July 31, 2023.  A lot of ups and downs per Andrea.  Hard dips. Got worse when retired and dealing with the new  house.  Had trouble getting rid of things from the old house causing regrets over sellling the house.  Andrea notes doesn't do well with instability  and stressors.  Ruminates on past mistakes. Blamed Andrea [name] for the  difficulty renovating the house.  Neg self talk and beats herself up. When not enough sleep will get depressed.  Does better if busy.   Dreads things.  Patient denies difficulty with sleep initiation or maintenance. Denies appetite disturbance.  Patient reports that energy and motivation have been good. Patient denies any difficulty with concentration.  Patient denies any suicidal ideation.  Plan:  Abilify 2.5 mg daily for a week, then 5 mg daily. Continue Lexapro 20 mg daily  08/31/2021 appt noted: It worked immediately.  Insomnia with EMA. Normally 9 hours.  It's like I'm high.  Not tired.  Average 3-4 hours. Not depressed and no crazy negative thoughts.  Good response.  Energetic but up in middle of night. Plan: Okay to stop Abilify because of insomnia.   09/29/2021 appointment with the following noted: Parties were great.  Sleep problem resolved.  A lot of rest at the beach. Maybe the last week or 2 less motivation and socialization.  Not laying in the bed.  Just not as good as she was.  Not as outgoing as normal. No alcohol now.  Only had 1-2 at night.  Not ruminating.   Willing to restart Abilify at lower dose 2 mg daily. 3 grandsons   Plan: Relapse depression recurs she can resume Abilify  but lower dose 2 mg daily or every other day to try to avoid the insomnia where she can contact our office and we can discussed the possibility of an alternative antidepressant such as Trintellix or duloxetine.  12/28/2021 appointment noted: Several phone calls since she was here.  Abilify plus Lexapro failed.  Switch to duloxetine 90 mg which she started 12/02/2018 2023 Started lamotrigine. Also started Rexulti Sister has had cycling depression that has responded well to lamotrigine with poor response to SSRIs [name] with a little dementia. SE tremor 90 mg duloxetine, ? Wt gain over a few weeks.. Exercise and wt control Social isolation, low self esteem, worry about the way she looks and not usually that way. Crying better with duloxetine.  More dependent on Andrea [name].   Anhedonia.  Black hole.  Never this low. No SI Having to use Xanax.   Asks about day treatment and [place] Plan: rec TMS Continue lamotrigine as prescribed Increase Rexulti to 1 mg daily Switch to Trintellix:  reduce duloxetine to 2 of the 30 mg capsules and start Trintellix 5 mg daily for 5 days,  Then Increase Trintellix to 10 mg daily and reduce duloxetine to 1 capsule for 1 week. Then stop duloxetine.  02/15/22 appt noted: On Trintellix , Rexulti 1 and lamotrigine I feel good.  Walks 3 miles daily is great for mental health. [employer] would not be covered by [facility]. Started feeling better after a week or 2 after the last visit. SE appetite increased. No nausea. Not crying anymore. Sleeping well and no longer ruminates. Plan: Continue Trintellix but DC Rexulti due to weight gain.  Continue lamotrigine  03/06/2022 appointment with the following noted: Last 2 weeks has been very sad and nervous. Trintellix was increased to 20 mg daily  03/10/2022 complaining of ongoing depression, feeling jittery, negative thinking, early morning awakening, ruminating about past decisions.  Wanting to  consider [place] because she is desperate for improvement. Plan we will schedule urgent appointment  03/15/22 urgent appt noted:  seen with Andrea [name] Very depressed, worst ever.  Don't want to be alone, ongoing worry, ruminating on past decisions.  Racing negative thoughts.  No motivation.  Trouble staying asleep.  Getting 4 hours and used to 9 hours. Dread getting up. Using Xanax Increased Trintellix to 20 mg 9 days ago. Andrea [name] agrees sleep is critical. Isolating.   Need to be better by July 11. Plan: Clonazepam 1 mg HS. Continue trintellix 20 mg daily she is only been on this dose 9 days and it needs more time. Re lamotrigine and increase to  150 mg daily.Arman Filter 1.5 mg every other day [facility]  03/24/22 appt noted: So much better.  Thinking straighter and not negative and not sad.  Sleeping better 8-9 hours.. No crying. Andrea [name] notices progressively better every day.  Mind clarity is much better.   No SE noted.  No hangover.  No nausea. Found a therapist, Santiago Bumpers.   Feels well enough to go to [place] and kind of excited.  Back July 16.   Plan: Clonazepam 1 mg HS. This resolved insomnia without SE Continue trintellix 20 mg daily she is only been on this dose 20 days and it needs more time. Re lamotriginecontinue 150 mg daily.Arman Filter 1.5 mg every other day [facility] [place]  04/12/22 appt noted: Great.  Went to [place].  Was herself and enjoyed it.  Anxiety was managed.   [facility] and liked her. 3# wt gain.   Patient reports stable mood and denies depressed or irritable moods.  Patient denies any recent difficulty with anxiety.  Patient denies difficulty with sleep initiation or maintenance. Denies appetite disturbance.  Patient reports that energy and motivation have been good.  Patient denies any difficulty with concentration.  Patient denies any suicidal ideation. Sleeping well than not anxious. Plan: Trintellix 20  07/10/22 TC depression returned  recently.  08/15/22 appt noted: Still blunted  and diminished motivation.  Some days better than others.  Going to gym.  Partially better.   Still has a lot of anxiety and doesn't want to be alone. Functioning but not normal.  Dreads parties.   SE tremor for a couple of week.sleep great.  Dep 7/10. Current Vraylar 1.5 mg daily, increase lamotrigine 200 mg daily, Trintellix 20 , clonazepam 0.5 mg HS   Past Psychiatric Medication Trials: Sertraline, fluoxetine, citalopram 50, nefazodone, mirtazapine, Lexapro 30 weight gain, Viibryd 30, Wellbutrin 300, Duloxetine 90 SE tremor Trintellix 20 Lamotrigine 200 Abilify, Rexulti 0.5 Vraylar 1.5 daily tremor  methyl folate buspirone,  alprazolam, clonazepam, Under the care of [facility] since January 2001  Sister does well on lamotrigine.  Review of Systems:  Review of Systems  Cardiovascular:  Negative for palpitations.  Gastrointestinal:  Negative for nausea.  Neurological:  Negative for tremors.  Psychiatric/Behavioral:  Positive for dysphoric mood. Negative for behavioral problems, confusion, decreased concentration, hallucinations, self-injury, sleep disturbance and suicidal ideas. The patient is nervous/anxious. The patient is not hyperactive.     Medications: I have reviewed the patient's current medications.  Current Outpatient Medications  Medication Sig Dispense Refill   atorvastatin (LIPITOR) 20 MG tablet Take 20 mg by mouth.      calcium citrate-vitamin Andrea (CITRACAL+Andrea) 315-200 MG-UNIT tablet Take by mouth.     clonazePAM (KLONOPIN) 0.5 MG tablet TAKE 1-2 TABLETS (0.5-1 MG TOTAL) BY MOUTH AT BEDTIME. 45 tablet 1   fluticasone (FLONASE) 50 MCG/ACT nasal spray fluticasone propionate 50 mcg/actuation nasal spray,suspension     lamoTRIgine (LAMICTAL) 200 MG tablet Take 1 tablet (200 mg total) by mouth daily. 90 tablet 1   levothyroxine (SYNTHROID, LEVOTHROID) 50 MCG tablet Take 50 mcg by mouth daily before  breakfast.     ondansetron (ZOFRAN-ODT) 4 MG disintegrating tablet Take 1 tablet (4 mg total) by mouth every 8 (eight) hours as needed for nausea or vomiting. 20 tablet 0   raloxifene (EVISTA) 60 MG tablet raloxifene 60 mg tablet     TRINTELLIX 20 MG TABS tablet TAKE 1 TABLET BY MOUTH EVERY DAY 30 tablet  2   VRAYLAR 1.5 MG capsule TAKE 1 CAPSULE BY MOUTH DAILY. 30 capsule 1   XIIDRA 5 % SOLN      No current facility-administered medications for this visit.    Medication Side Effects: None  Allergies:  Allergies  Allergen Reactions   Hydrocodone-Acetaminophen Other (See Comments)    Passes out   Morphine Other (See Comments)    Passes out    Past Medical History:  Diagnosis Date   GAD (generalized anxiety disorder)    Hyperlipidemia    Hypothyroidism    Osteopenia    Sinusitis     Family History  Problem Relation Age of Onset   Arthritis Mother    COPD Mother    Hypercholesterolemia Mother    Dementia Mother    Stroke Mother    Prostate cancer Brother    Cancer Maternal Grandmother        Oral    Social History   Socioeconomic History   Marital status: Married    Spouse name: Not on file   Number of children: Not on file   Years of education: Not on file   Highest education level: Not on file  Occupational History   Not on file  Tobacco Use   Smoking status: Never   Smokeless tobacco: Never  Vaping Use   Vaping Use: Never used  Substance and Sexual Activity   Alcohol use: Not Currently    Comment: social   Drug use: Never   Sexual activity: Not on file  Other Topics Concern   Not on file  Social History Narrative   Married.   1 child. 2 grandchildren.   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   Enjoys exercising, spending time with family   Social Determinants of Health   Financial Resource Strain: Low Risk  (02/24/2022)   Overall Financial Resource Strain (CARDIA)    Difficulty of Paying Living Expenses: Not hard at all  Food Insecurity: No  Food Insecurity (02/24/2022)   Hunger Vital Sign    Worried About Running Out of Food in the Last Year: Never true    Ran Out of Food in the Last Year: Never true  Transportation Needs: No Transportation Needs (02/24/2022)   PRAPARE - Hydrologist (Medical): No    Lack of Transportation (Non-Medical): No  Physical Activity: Sufficiently Active (02/24/2022)   Exercise Vital Sign    Days of Exercise per Week: 5 days    Minutes of Exercise per Session: 90 min  Stress: No Stress Concern Present (02/24/2022)   [place]    Feeling of Stress : Not at all  Social Connections: Not on file  Intimate Partner Violence: Not on file    Past Medical History, Surgical history, Social history, and Family history were reviewed and updated as appropriate.   3 grandsons.  Please see review of systems for further details on the patient's review from today.   Objective:   Physical Exam:  There were no vitals taken for this visit.  Physical Exam Constitutional:      General: She is not in acute distress.    Appearance: She is well-developed.  Musculoskeletal:        General: No deformity.  Neurological:     Mental Status: She is alert and oriented to person, place, and time.     Motor: No tremor.     Coordination: Coordination normal.  Gait: Gait normal.  Psychiatric:        Attention and Perception: She is attentive. She does not perceive auditory hallucinations.        Mood and Affect: Mood is anxious and depressed. Affect is not labile, blunt, angry, tearful or inappropriate.        Speech: Speech normal.        Behavior: Behavior normal.        Thought Content: Thought content normal. Thought content is not delusional. Thought content does not include homicidal or suicidal ideation. Thought content does not include suicidal plan.        Cognition and Memory: Cognition and memory normal.         Judgment: Judgment normal.     Comments: Insight intact. No auditory or visual hallucinations. No delusions.       Lab Review:  No results found for: "NA", "K", "CL", "CO2", "GLUCOSE", "BUN", "CREATININE", "CALCIUM", "PROT", "ALBUMIN", "AST", "ALT", "ALKPHOS", "BILITOT", "GFRNONAA", "GFRAA"  No results found for: "WBC", "RBC", "HGB", "HCT", "PLT", "MCV", "MCH", "MCHC", "RDW", "LYMPHSABS", "MONOABS", "EOSABS", "BASOSABS"  No results found for: "POCLITH", "LITHIUM"   No results found for: "PHENYTOIN", "PHENOBARB", "VALPROATE", "CBMZ"   .res Assessment: Plan:    Severe episode of recurrent major depressive disorder, without psychotic features ([place])  Generalized anxiety disorder  Insomnia due to mental condition  Recurrent major depression in partial remission ([place])   Greater than 50% of 30-minute face to face time with patient was spent on counseling and coordination of care.  Recent worsening mood as noted before. Dramatically better mood and anxiety since adding Vraylar and increasing Tritellix  .  Then lost benefit.  We discussed the short-term risks associated with benzodiazepines including sedation and increased fall risk among others.  Discussed long-term side effect risk including dependence, potential withdrawal symptoms, and the potential eventual dose-related risk of dementia.  But recent studies from 2020 dispute this association between benzodiazepines and dementia risk. Newer studies in 2020 do not support an association with dementia.  Extensive disc of options and consider lithium bc B responded and TR status. Defer.  Reduce Clonazepam 1/2  mg HS. This resolved insomnia without SE  Continue trintellix 20 mg daily only mildly effective.  lamotrigine continue 200 mg daily.[place] DT lost response  Auvelity Stop Vraylar After Thanksgiving stop Trintellix Wait 3 days then [employer] 1 in the morning for 1 week,  and if no side effects then increase  Auvelity to 1 in the AM and 1 in the PM  Rec counseling .  She has a scheduled appt.  FU 3 weeks Lynder Parents, MD, DFAPA  Please see After Visit Summary for patient specific instructions.  Lynder Parents, MD, DFAPA   No future appointments.    No orders of the defined types were placed in this encounter.      -------------------------------

## 2022-08-16 ENCOUNTER — Telehealth: Payer: Self-pay | Admitting: Psychiatry

## 2022-08-16 NOTE — Telephone Encounter (Signed)
Pt is hosting a party on 12/3.She wants to know if changing meds would be a good idea before then or if she should wait until after to make the changes

## 2022-08-16 NOTE — Telephone Encounter (Signed)
Andrea [name] was seen yesterday by Dr. Clovis Pu and would like [name] to call her about one of her medications. One of them was changed and needs to know something about it. Phone number is [phone].

## 2022-08-16 NOTE — Telephone Encounter (Signed)
Pt informed

## 2022-08-16 NOTE — Telephone Encounter (Signed)
She should have a print out of my instructions from yesterday.  Here's what it said: Stop Arman Filter (now) After Thanksgiving stop Trintellix Wait 3 days then Start Auvelity 1 in the morning for 1 week,  and if no side effects then increase Auvelity to 1 in the AM and 1 in the PM

## 2022-08-22 DIAGNOSIS — M62830 Muscle spasm of back: Secondary | ICD-10-CM | POA: Diagnosis not present

## 2022-08-22 DIAGNOSIS — M955 Acquired deformity of pelvis: Secondary | ICD-10-CM | POA: Diagnosis not present

## 2022-08-22 DIAGNOSIS — M9905 Segmental and somatic dysfunction of pelvic region: Secondary | ICD-10-CM | POA: Diagnosis not present

## 2022-08-22 DIAGNOSIS — M9903 Segmental and somatic dysfunction of lumbar region: Secondary | ICD-10-CM | POA: Diagnosis not present

## 2022-09-04 ENCOUNTER — Telehealth: Payer: Self-pay | Admitting: Psychiatry

## 2022-09-04 ENCOUNTER — Other Ambulatory Visit: Payer: Self-pay | Admitting: Psychiatry

## 2022-09-04 MED ORDER — AUVELITY 45-105 MG PO TBCR: 1.0000 | EXTENDED_RELEASE_TABLET | Freq: Two times a day (BID) | ORAL | 0 refills | Status: DC

## 2022-09-04 MED ORDER — LITHIUM CARBONATE ER 300 MG PO TBCR: 300.0000 mg | EXTENDED_RELEASE_TABLET | Freq: Every evening | ORAL | 0 refills | Status: DC

## 2022-09-04 NOTE — Telephone Encounter (Signed)
She is taking Auvelity 1 twice daily and lamotrigine 200 mg daily and the klonopin at bedtime.She wants to know if she should continue meds with the lithium or should she stop auvelity?

## 2022-09-04 NOTE — Telephone Encounter (Signed)
Please list her current psychiatric medications.  At this point she should be on Auvelity 1 twice daily and lamotrigine 200 mg daily.  She should also be off of Trintellix and Vraylar. I agree with the trial of lithium and it could possibly help her as soon as a week.  I will send in a low-dose but tell her if she does not feel better by the middle of next week then call us back and we will increase the dose. Lithium CR 300 mg tab, 1 HS

## 2022-09-04 NOTE — Telephone Encounter (Signed)
Andrea [name] called at 10:09 to report that she is not doing well.  Her medications are not working. You had discussed her starting Lithium.  She didn't want to do that but she thinks perhaps she needs to start it after all.  Please call to discuss.  Appt 1/30

## 2022-09-04 NOTE — Telephone Encounter (Signed)
Lithium is intended to improve the antidepressant effect of the antidepressant Auvelity.  Take lihtium with the other meds for now.  Once she is better then we might consider dropping something.

## 2022-09-04 NOTE — Progress Notes (Signed)
See phone note

## 2022-09-04 NOTE — Telephone Encounter (Signed)
Pt stated she is not doing well.She wakes up depressed and it is hard to function and pretend to smile.She does not want to be around other people.She feels like she should start lithium and wants to know how long it will take to take affect.

## 2022-09-05 NOTE — Telephone Encounter (Signed)
Pt informed

## 2022-09-08 ENCOUNTER — Ambulatory Visit: Payer: Medicare Other | Admitting: Psychiatry

## 2022-09-10 ENCOUNTER — Other Ambulatory Visit: Payer: Self-pay | Admitting: Psychiatry

## 2022-09-11 ENCOUNTER — Telehealth: Payer: Self-pay

## 2022-09-12 NOTE — Telephone Encounter (Signed)
I need to see her to decide what to do next.  Have her come Wed 330pm. Pls call today

## 2022-09-12 NOTE — Telephone Encounter (Signed)
Called her and she is scheduled for tomorrow at 3:30.

## 2022-09-13 ENCOUNTER — Ambulatory Visit (INDEPENDENT_AMBULATORY_CARE_PROVIDER_SITE_OTHER): Payer: Medicare Other | Admitting: Psychiatry

## 2022-09-13 ENCOUNTER — Encounter: Payer: Self-pay | Admitting: Psychiatry

## 2022-09-13 DIAGNOSIS — F5105 Insomnia due to other mental disorder: Secondary | ICD-10-CM

## 2022-09-13 DIAGNOSIS — F339 Major depressive disorder, recurrent, unspecified: Secondary | ICD-10-CM | POA: Diagnosis not present

## 2022-09-13 DIAGNOSIS — F331 Major depressive disorder, recurrent, moderate: Secondary | ICD-10-CM

## 2022-09-13 DIAGNOSIS — F411 Generalized anxiety disorder: Secondary | ICD-10-CM | POA: Diagnosis not present

## 2022-09-13 MED ORDER — NORTRIPTYLINE HCL 25 MG PO CAPS: ORAL_CAPSULE | ORAL | 1 refills | Status: DC

## 2022-09-13 MED ORDER — LAMOTRIGINE 100 MG PO TABS: 150.0000 mg | ORAL_TABLET | Freq: Every day | ORAL | 1 refills | Status: DC

## 2022-09-13 NOTE — Progress Notes (Signed)
Andrea [name] [id number] [birth year] 71 y.o.  Subjective:   Patient ID:  Andrea [name] is a 71 y.o. (DOB 08/14/1951) female.  Chief Complaint:  Chief Complaint  Patient presents with   Follow-up   Depression   Anxiety    HPI Andrea [name] presents to the office today for follow-up of MDE and anxiety disorder.  seen 09/2019.  No meds were changed.  Been on Lexapro 20 since early 2017.  02/27/2020 phone call from patient: Pt experiencing more anxiety than she normally has. Pt would like to know what are some options that she has as far as her medication. Pt will be in an appt  MD response: If the anxiety is just occasional then using alprazolam as needed would be an okay thing to do.  If it is been fairly persistent for a couple of weeks or more than the simplest solution would be to increase the Lexapro to 1-1/2 tablets daily.  Historically she has done well on Lexapro 20 mg daily for an extended period of time.  If she is under temporary stress then once that is resolved we could drop it back to 1 daily.  If this does not work as expected then she should schedule an earlier appointment. Pt response: Patient called back and she's been having temporary stress, but it's not bad. Things are great, she's working out and feeling okay for the most part. It's been going on for a bit so she agrees to the increase Lexapro 20 mg 1.5 tablets daily. She has been taking alprazolam at hs occasionally to help sleep better and it does help.   12/06/2020 appointment with the following noted:  No covid.  H Covid and recovered.   Had increased Lexapro to 30 mg for 90 days and saw a difference but started seeing weight gain and was doing oK and able to drop back and been OK. Still renovating a house for a year.  Sold her house and mourns the house. Calmed down.  Kept 3 gkids and dogs 10 days.   [place] for new house May 1. Rare alprazolam for HS. Stress moving to condo and would have crying spell over the move  and some problems she was running into dealing with it.  Hard with change.  But taking time to adjust to the idea of moving from her house of 23 years.  Initially refused but he left it up to her and she's come to peace about it.  Andrea [name].   Good response to medication and no SE.  Exercising is good medicine.  If doesn't then doesn't feel as good. Satisfied. Plan: no med changes  08/01/21  appt noted:  seen with [name] died 2023-08-12.  A lot of ups and downs per D.  Hard dips. Got worse when retired and dealing with the new  house.  Had trouble getting rid of things from the old house causing regrets over sellling the house.  D notes doesn't do well with instability  and stressors.  Ruminates on past mistakes. Blamed Andrea [name] for the  difficulty renovating the house.  Neg self talk and beats herself up. When not enough sleep will get depressed.  Does better if busy.   Dreads things.  Patient denies difficulty with sleep initiation or maintenance. Denies appetite disturbance.  Patient reports that energy and motivation have been good. Patient denies any difficulty with concentration.  Patient denies any suicidal ideation.  Plan: Abilify 2.5 mg daily  for a week, then 5 mg daily. Continue Lexapro 20 mg daily  08/31/2021 appt noted: It worked immediately.  Insomnia with EMA. Normally 9 hours.  It's like I'm high.  Not tired.  Average 3-4 hours. Not depressed and no crazy negative thoughts.  Good response.  Energetic but up in middle of night. Plan: Okay to stop Abilify because of insomnia.   09/29/2021 appointment with the following noted: Parties were great.  Sleep problem resolved.  A lot of rest at the beach. Maybe the last week or 2 less motivation and socialization.  Not laying in the bed.  Just not as good as she was.  Not as outgoing as normal. No alcohol now.  Only had 1-2 at night.  Not ruminating.   Willing to restart Abilify at lower dose 2 mg daily. 3 grandsons  Plan: Relapse  depression recurs she can resume Abilify  but lower dose 2 mg daily or every other day to try to avoid the insomnia where she can contact our office and we can discussed the possibility of an alternative antidepressant such as Trintellix or duloxetine.  12/28/2021 appointment noted: Several phone calls since she was here.  Abilify plus Lexapro failed.  Switch to duloxetine 90 mg which she started 12/02/2018 2023 Started lamotrigine. Also started Rexulti Sister has had cycling depression that has responded well to lamotrigine with poor response to SSRIs [name] with a little dementia. SE tremor 90 mg duloxetine, ? Wt gain over a few weeks.. Exercise and wt control Social isolation, low self esteem, worry about the way she looks and not usually that way. Crying better with duloxetine.  More dependent on H.   Anhedonia.  Black hole.  Never this low. No SI Having to use Xanax.   Asks about day treatment and [facility] Plan: rec TMS Continue lamotrigine as prescribed Increase Rexulti to 1 mg daily Switch to Trintellix:  reduce duloxetine to 2 of the 30 mg capsules and start Trintellix 5 mg daily for 5 days,  Then Increase Trintellix to 10 mg daily and reduce duloxetine to 1 capsule for 1 week. Then stop duloxetine.  02/15/22 appt noted: On Trintellix , Rexulti 1 and lamotrigine I feel good.  Walks 3 miles daily is great for mental health. Andrea [name] would not be covered by [facility]. Started feeling better after a week or 2 after the last visit. SE appetite increased. No nausea. Not crying anymore. Sleeping well and no longer ruminates. Plan: Continue Trintellix but DC Rexulti due to weight gain.  Continue lamotrigine  03/06/2022 appointment with the following noted: Last 2 weeks has been very sad and nervous. Trintellix was increased to 20 mg daily  03/10/2022 complaining of ongoing depression, feeling jittery, negative thinking, early morning awakening, ruminating about past decisions.  Wanting to consider Andrea [name]  because she is desperate for improvement. Plan we will schedule urgent appointment  03/15/22 urgent appt noted:  seen with Andrea [name] Very depressed, worst ever.  Don't want to be alone, ongoing worry, ruminating on past decisions.  Racing negative thoughts.  No motivation.  Trouble staying asleep.  Getting 4 hours and used to 9 hours. Dread getting up. Using Xanax Increased Trintellix to 20 mg 9 days ago. H agrees sleep is critical. Isolating.   Need to be better by July 11. Plan: Clonazepam 1 mg HS. Continue trintellix 20 mg daily she is only been on this dose 9 days and it needs more time. Re lamotrigine and increase to 150 mg daily.Marland Kitchen  Vraylar 1.5 mg every other day Option TMS  03/24/22 appt noted: So much better.  Thinking straighter and not negative and not sad.  Sleeping better 8-9 hours.. No crying. H notices progressively better every day.  Mind clarity is much better.   No SE noted.  No hangover.  No nausea. Found a therapist, Andrea [name].   Feels well enough to go to [place] and kind of excited.  Back July 16.   Plan: Clonazepam 1 mg HS. This resolved insomnia without SE Continue trintellix 20 mg daily she is only been on this dose 20 days and it needs more time. Re lamotriginecontinue 150 mg daily.Andrea [name] 1.5 mg every other day Option Andrea [name]  04/12/22 appt noted: Great.  Went to [place].  Was herself and enjoyed it.  Anxiety was managed.   [facility] and liked her. 3# wt gain.   Patient reports stable mood and denies depressed or irritable moods.  Patient denies any recent difficulty with anxiety.  Patient denies difficulty with sleep initiation or maintenance. Denies appetite disturbance.  Patient reports that energy and motivation have been good.  Patient denies any difficulty with concentration.  Patient denies any suicidal ideation. Sleeping well than not anxious. Plan: Trintellix 20  07/10/22 TC depression returned recently.  08/15/22 appt  noted: Still blunted  and diminished motivation.  Some days better than others.  Going to gym.  Partially better.   Still has a lot of anxiety and doesn't want to be alone. Functioning but not normal.  Dreads parties.   SE tremor for a couple of week.sleep great.  Dep 7/10. Current Vraylar 1.5 mg daily, increase lamotrigine 200 mg daily, Trintellix 20 , clonazepam 0.5 mg HS Plan: Reduce Clonazepam 1/2  mg HS. This resolved insomnia without SE Continue trintellix 20 mg daily only mildly effective. lamotrigine continue 200 mg daily.Andrea [name] lost response Auvelity Stop Vraylar After Thanksgiving stop Trintellix Wait 3 days then [employer] 1 in the morning for 1 week,  and if no side effects then increase Auvelity to 1 in the AM and 1 in the PM  09/04/22 TC: Complaining of persistent depression and wanting to do something else to help.  Added lithium CR 300 mg nightly  09/11/2022 phone call complaining of no improvement with Auvelity and lithium.  Appointment was moved up.  09/13/22 appt noted: Nothing changed. No better and no worse.  Awakens sad and tearful.  Sleeps well. Pushing herself to the gym.  Has a trainer. On clonazepam 0.5 mg HS She remains anxious when alone for no apparent reason.  Feelings of inferiority.  She is normally extroverted but now she is wanting to isolate from people.  All tasks seem monumental.  No enjoyment and not looking forward to anything.  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  She remains generally worried and anxious which is not typical for her.   Past Psychiatric Medication Trials: Sertraline, fluoxetine, citalopram 50, nefazodone, mirtazapine, Lexapro 30 weight gain, Viibryd 30, Wellbutrin 300, Duloxetine 90 SE tremor Trintellix 20 little response Auvelity twice daily for 3 weeks no response  Lamotrigine 200 no response Abilify, Rexulti 0.5 Vraylar 1.5 daily tremor and lost response Lithium 300  methyl folate buspirone,  alprazolam,  clonazepam, Under the care of [facility] since January 2001  Sister does well on lamotrigine.  Review of Systems:  Review of Systems  Cardiovascular:  Negative for palpitations.  Gastrointestinal:  Negative for constipation and nausea.  Neurological:  Negative for tremors.  Psychiatric/Behavioral:  Positive for dysphoric mood. Negative for behavioral problems, confusion, decreased concentration, hallucinations, self-injury, sleep disturbance and suicidal ideas. The patient is nervous/anxious. The patient is not hyperactive.     Medications: I have reviewed the patient's current medications.  Current Outpatient Medications  Medication Sig Dispense Refill   atorvastatin (LIPITOR) 20 MG tablet Take 20 mg by mouth.      calcium citrate-vitamin D (CITRACAL+D) 315-200 MG-UNIT tablet Take by mouth.     clonazePAM (KLONOPIN) 0.5 MG tablet Take 1-2 tablets (0.5-1 mg total) by mouth at bedtime. 45 tablet 1   fluticasone (FLONASE) 50 MCG/ACT nasal spray fluticasone propionate 50 mcg/actuation nasal spray,suspension     levothyroxine (SYNTHROID, LEVOTHROID) 50 MCG tablet Take 50 mcg by mouth daily before breakfast.     lithium carbonate (LITHOBID) 300 MG ER tablet Take 1 tablet (300 mg total) by mouth at bedtime. 90 tablet 0   nortriptyline (PAMELOR) 25 MG capsule 1 at night for 4 nights, then 2 at night for 4 nights then 3 at night 90 capsule 1   raloxifene (EVISTA) 60 MG tablet raloxifene 60 mg tablet     XIIDRA 5 % SOLN      lamoTRIgine (LAMICTAL) 100 MG tablet Take 1.5 tablets (150 mg total) by mouth daily. 45 tablet 1   No current facility-administered medications for this visit.    Medication Side Effects: None  Allergies:  Allergies  Allergen Reactions   Hydrocodone-Acetaminophen Other (See Comments)    Passes out   Morphine Other (See Comments)    Passes out    Past Medical History:  Diagnosis Date   GAD (generalized anxiety disorder)    Hyperlipidemia     Hypothyroidism    Osteopenia    Sinusitis     Family History  Problem Relation Age of Onset   Arthritis Mother    COPD Mother    Hypercholesterolemia Mother    Dementia Mother    Stroke Mother    Prostate cancer Brother    Cancer Maternal Grandmother        Oral    Social History   Socioeconomic History   Marital status: Married    Spouse name: Not on file   Number of children: Not on file   Years of education: Not on file   Highest education level: Not on file  Occupational History   Not on file  Tobacco Use   Smoking status: Never   Smokeless tobacco: Never  Vaping Use   Vaping Use: Never used  Substance and Sexual Activity   Alcohol use: Not Currently    Comment: social   Drug use: Never   Sexual activity: Not on file  Other Topics Concern   Not on file  Social History Narrative   Married.   1 child. 2 grandchildren.   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   Enjoys exercising, spending time with family   Social Determinants of Health   Financial Resource Strain: Low Risk  (02/24/2022)   Overall Financial Resource Strain (CARDIA)    Difficulty of Paying Living Expenses: Not hard at all  Food Insecurity: No Food Insecurity (02/24/2022)   Hunger Vital Sign    Worried About Running Out of Food in the Last Year: Never true    Ran Out of Food in the Last Year: Never true  Transportation Needs: No Transportation Needs (02/24/2022)   PRAPARE - Hydrologist (Medical): No  Lack of Transportation (Non-Medical): No  Physical Activity: Sufficiently Active (02/24/2022)   Exercise Vital Sign    Days of Exercise per Week: 5 days    Minutes of Exercise per Session: 90 min  Stress: No Stress Concern Present (02/24/2022)   [place]    Feeling of Stress : Not at all  Social Connections: Not on file  Intimate Partner Violence: Not on file    Past Medical History, Surgical  history, Social history, and Family history were reviewed and updated as appropriate.   3 grandsons.  Please see review of systems for further details on the patient's review from today.   Objective:   Physical Exam:  There were no vitals taken for this visit.  Physical Exam Constitutional:      General: She is not in acute distress.    Appearance: She is well-developed.  Musculoskeletal:        General: No deformity.  Neurological:     Mental Status: She is alert and oriented to person, place, and time.     Motor: No tremor.     Coordination: Coordination normal.     Gait: Gait normal.  Psychiatric:        Attention and Perception: She is attentive. She does not perceive auditory hallucinations.        Mood and Affect: Mood is anxious and depressed. Affect is not labile, blunt, angry, tearful or inappropriate.        Speech: Speech normal.        Behavior: Behavior normal.        Thought Content: Thought content normal. Thought content is not delusional. Thought content does not include homicidal or suicidal ideation. Thought content does not include suicidal plan.        Cognition and Memory: Cognition and memory normal.        Judgment: Judgment normal.     Comments: Insight intact. No auditory or visual hallucinations. No delusions.  No change in mood with change in antidepressant.     Lab Review:  No results found for: "NA", "K", "CL", "CO2", "GLUCOSE", "BUN", "CREATININE", "CALCIUM", "PROT", "ALBUMIN", "AST", "ALT", "ALKPHOS", "BILITOT", "GFRNONAA", "GFRAA"  No results found for: "WBC", "RBC", "HGB", "HCT", "PLT", "MCV", "MCH", "MCHC", "RDW", "LYMPHSABS", "MONOABS", "EOSABS", "BASOSABS"  No results found for: "POCLITH", "LITHIUM"   No results found for: "PHENYTOIN", "PHENOBARB", "VALPROATE", "CBMZ"   .res Assessment: Plan:    Major depressive disorder, recurrent episode, moderate (HCC) - Plan: lamoTRIgine (LAMICTAL) 100 MG tablet, nortriptyline (PAMELOR) 25 MG  capsule, Nortriptyline (Aventyl), Serum [[facility]]  Generalized anxiety disorder  Insomnia due to mental condition  Recurrent major depression resistant to treatment (Andrea [name])   Greater than 50% of 30-minute face to face time with patient was spent on counseling and coordination of care.  Recent worsening mood as noted before. Dramatically better mood and anxiety since adding Vraylar and increasing Tritellix  .  Then lost benefit. No benefit from Auvelity twice daily for 3 weeks.  We discussed the short-term risks associated with benzodiazepines including sedation and increased fall risk among others.  Discussed long-term side effect risk including dependence, potential withdrawal symptoms, and the potential eventual dose-related risk of dementia.  But recent studies from 2020 dispute this association between benzodiazepines and dementia risk. Newer studies in 2020 do not support an association with dementia.  Extensive disc of options.  She has failed multiple meds as noted above including all the usual categories of antidepressants with  the exception of tricyclic antidepressants and MAO inhibitors.  The next best tolerated option would be nortriptyline.  Discussed this at length as well as side effects.  Will get a blood level to optimize dosing.  Consider Genesight  Clonazepam 1/2  mg HS. This resolved insomnia without SE Stop Auvelity Start nortriptyline 1 of the 25 mg capsules at night for 4 nights then 2 at night for 4 nights then 3 at night, then wait 1 week and get the blood test in the morning at [facility] Reduce lamotrigine to 1 and 1/2 tablets daily   Lamotrigine has not been helpful to him 100 mg daily.  Start reducing lamotrigine 150 mg daily and will continue to taper at next appointment.  Discussed Spravato at length.  Describes side effects to her.  Described the process of administration and the commitment of time.  Describes a temporary benefit unless continued consistently.  If  she does not improve with nortriptyline it is recommended that she pursue Spravato administration.  Gave her a brochure to read.  Rec counseling .  She has a scheduled appt.  FU 3 weeks Andrea Parents, MD, DFAPA  Please see After Visit Summary for patient specific instructions.  Andrea Parents, MD, DFAPA   Future Appointments  Date Time Provider [place]  10/20/2022  2:00 PM Cottle, Billey Co., MD [facility] None  10/24/2022 11:30 AM Cottle, Billey Co., MD [facility] None      Orders Placed This Encounter  Procedures   Nortriptyline (Aventyl), Serum [[facility]]       -------------------------------

## 2022-09-13 NOTE — Patient Instructions (Addendum)
Stop [employer] nortriptyline 1 of the 25 mg capsules at night for 4 nights then 2 at night for 4 nights then 3 at night, then wait 1 week and get the blood test in the morning at [facility] Reduce lamotrigine to 1 and 1/2 tablets daily

## 2022-09-14 ENCOUNTER — Ambulatory Visit: Payer: Medicare Other | Admitting: Psychiatry

## 2022-09-21 ENCOUNTER — Telehealth: Payer: Self-pay | Admitting: Psychiatry

## 2022-09-21 NOTE — Telephone Encounter (Signed)
Rtc to pt and gave her info.She understands she has the other options and wants to discuss further with you.She wants to know if she could be having side effects from [employer]

## 2022-09-21 NOTE — Telephone Encounter (Signed)
Noted! Thank you

## 2022-09-21 NOTE — Telephone Encounter (Signed)
Pt called reporting started new med a week ago and never felt this bad. Crying, don't want to be left alone. RTC [phone]

## 2022-09-21 NOTE — Telephone Encounter (Signed)
Pt stated she can't live this way.This is the worst she has ever felt.She is currently taking 2 nortriptyline 25 mg daily and the lithium 150 mg daily.She stated she is depressed,crying,and does not want to be left alone but also does not want to be around people.She said that she feels no hope.

## 2022-09-21 NOTE — Telephone Encounter (Signed)
Pt called back in on 12/28- Not doing well. Needs a call back

## 2022-09-21 NOTE — Telephone Encounter (Signed)
She was seen last week and stopped Auvelity at that time.  Please verify that she did stop it as instructed.  It is long been out of her system so it is not causing side effects if she stopped as instructed.  She should be taking nortriptyline 2 at night and about ready to go to 3 at night.  What she is taking 3 of the 25 mg capsules for a week she is supposed to go get the blood test to measure the blood level of nortriptyline and we will adjust the dose from that point.

## 2022-09-21 NOTE — Telephone Encounter (Signed)
She has had a relapse of severe depression and has failed to respond to multiple medications and is feeling desperate.  I started her on a new antidepressant nortriptyline last week but she is not even at full dosage yet.  She needs to stick with the plan for nortriptyline and give it a chance.  As instructed, after she is on 75 mg for a week then we can get a blood level and may have some idea about how to adjust the medicine.  But it is not going to work quickly.  As I told her at the last appointment I have tried everything that I can that will work quickly.  I gave her information about Spravato.  I do not know if her insurance would cover it but if so that is a good option to pursue.  Please mention to her the other options are referring for [place] at [facility] or [facility] or the option of ECT at [facility] or [facility].  Those options have a chance to work quicker and the odds of success are better with any of those latter options.  I discussed this somewhat with her at her last appointment that she has treatment resistant depression and may need to consider options beyond medications.

## 2022-09-21 NOTE — Telephone Encounter (Signed)
RTC to pt and confirmed she did stop taking Auvelity as instructed. She is taking 2 of the 25 mg nortriptyline, she will go to 3 of tonight. She is aware about blood work. Encouraged her that she will start feeling better soon.

## 2022-09-26 ENCOUNTER — Telehealth: Payer: Self-pay | Admitting: Psychiatry

## 2022-09-26 NOTE — Telephone Encounter (Signed)
Pt stated she has not been doing well at all.She is currently tasking Lamictal 100 mg ,nortriptyline 75 mg, lithium 300 mg,and 2 klonopin HS.She has not been able to sleep and does not want to be left alone.She has been crying,panicked,nervous,and stated she is worn out and in critical condition.Please advise

## 2022-09-26 NOTE — Telephone Encounter (Signed)
Pt called at 9:55a.  She said she is not feeling well.  She wanted to speak to Dr Cottle's nurse.  She said she had terrible holidays.  I asked if it was medicine related.  She said Dr Clovis Pu has her on some meds but she didn't say if she thought was medicine related or not.  She said it was urgent.  I told her I would put this message in but also send instant message to clinical.

## 2022-09-26 NOTE — Telephone Encounter (Signed)
Have her come to an appt on 09/27/22 at 330 pm with her husband.

## 2022-09-27 ENCOUNTER — Ambulatory Visit (INDEPENDENT_AMBULATORY_CARE_PROVIDER_SITE_OTHER): Payer: Medicare Other | Admitting: Psychiatry

## 2022-09-27 ENCOUNTER — Encounter: Payer: Self-pay | Admitting: Psychiatry

## 2022-09-27 DIAGNOSIS — F339 Major depressive disorder, recurrent, unspecified: Secondary | ICD-10-CM | POA: Diagnosis not present

## 2022-09-27 DIAGNOSIS — F411 Generalized anxiety disorder: Secondary | ICD-10-CM

## 2022-09-27 DIAGNOSIS — F5105 Insomnia due to other mental disorder: Secondary | ICD-10-CM | POA: Diagnosis not present

## 2022-09-27 NOTE — Progress Notes (Addendum)
Andrea [name] [id number] 02-26-1951 72 y.o.  Subjective:   Patient ID:  Andrea [name] is a 72 y.o. (DOB [birth year]) female.  Chief Complaint:  Chief Complaint  Patient presents with   Depression   Anxiety   Medication Problem   Sleeping Problem    HPI Andrea [name] presents to the office today for follow-up of MDE and anxiety disorder.  seen 09/2019.  No meds were changed.  Been on Lexapro 20 since early 2017.  02/27/2020 phone call from patient: Pt experiencing more anxiety than she normally has. Pt would like to know what are some options that she has as far as her medication. Pt will be in an appt  MD response: If the anxiety is just occasional then using alprazolam as needed would be an okay thing to do.  If it is been fairly persistent for a couple of weeks or more than the simplest solution would be to increase the Lexapro to 1-1/2 tablets daily.  Historically she has done well on Lexapro 20 mg daily for an extended period of time.  If she is under temporary stress then once that is resolved we could drop it back to 1 daily.  If this does not work as expected then she should schedule an earlier appointment. Pt response: Patient called back and she's been having temporary stress, but it's not bad. Things are great, she's working out and feeling okay for the most part. It's been going on for a bit so she agrees to the increase Lexapro 20 mg 1.5 tablets daily. She has been taking alprazolam at hs occasionally to help sleep better and it does help.   12/06/2020 appointment with the following noted:  No covid.  H Covid and recovered.   Had increased Lexapro to 30 mg for 90 days and saw a difference but started seeing weight gain and was doing oK and able to drop back and been OK. Still renovating a house for a year.  Sold her house and mourns the house. Calmed down.  Kept 3 gkids and dogs 10 days.   [place] for new house May 1. Rare alprazolam for HS. Stress moving to condo and would have  crying spell over the move and some problems she was running into dealing with it.  Hard with change.  But taking time to adjust to the idea of moving from her house of 23 years.  Initially refused but he left it up to her and she's come to peace about it.  [name]   Good response to medication and no SE.  Exercising is good medicine.  If doesn't then doesn't feel as good. Satisfied. Plan: no med changes  08/01/21  appt noted:  seen with [name] died 2023-08-03.  A lot of ups and downs per D.  Hard dips. Got worse when retired and dealing with the new  house.  Had trouble getting rid of things from the old house causing regrets over sellling the house.  D notes doesn't do well with instability  and stressors.  Ruminates on past mistakes. Blamed [name] for the  difficulty renovating the house.  Neg self talk and beats herself up. When not enough sleep will get depressed.  Does better if busy.   Dreads things.  Patient denies difficulty with sleep initiation or maintenance. Denies appetite disturbance.  Patient reports that energy and motivation have been good. Patient denies any difficulty with concentration.  Patient denies any suicidal ideation.  Plan: Abilify 2.5 mg daily for a week, then 5 mg daily. Continue Lexapro 20 mg daily  08/31/2021 appt noted: It worked immediately.  Insomnia with EMA. Normally 9 hours.  It's like I'm high.  Not tired.  Average 3-4 hours. Not depressed and no crazy negative thoughts.  Good response.  Energetic but up in middle of night. Plan: Okay to stop Abilify because of insomnia.   09/29/2021 appointment with the following noted: Parties were great.  Sleep problem resolved.  A lot of rest at the beach. Maybe the last week or 2 less motivation and socialization.  Not laying in the bed.  Just not as good as she was.  Not as outgoing as normal. No alcohol now.  Only had 1-2 at night.  Not ruminating.   Willing to restart Abilify at lower dose 2 mg daily. 3  grandsons  Plan: Relapse depression recurs she can resume Abilify  but lower dose 2 mg daily or every other day to try to avoid the insomnia where she can contact our office and we can discussed the possibility of an alternative antidepressant such as Trintellix or duloxetine.  12/28/2021 appointment noted: Several phone calls since she was here.  Abilify plus Lexapro failed.  Switch to duloxetine 90 mg which she started 12/02/2018 2023 Started lamotrigine. Also started Rexulti Sister has had cycling depression that has responded well to lamotrigine with poor response to SSRIs [name] with a little dementia. SE tremor 90 mg duloxetine, ? Wt gain over a few weeks.. Exercise and wt control Social isolation, low self esteem, worry about the way she looks and not usually that way. Crying better with duloxetine.  More dependent on H.   Anhedonia.  Black hole.  Never this low. No SI Having to use Xanax.   Asks about day treatment and [place] Plan: rec TMS Continue lamotrigine as prescribed Increase Rexulti to 1 mg daily Switch to Trintellix:  reduce duloxetine to 2 of the 30 mg capsules and start Trintellix 5 mg daily for 5 days,  Then Increase Trintellix to 10 mg daily and reduce duloxetine to 1 capsule for 1 week. Then stop duloxetine.  02/15/22 appt noted: On Trintellix , Rexulti 1 and lamotrigine I feel good.  Walks 3 miles daily is great for mental health. [place] would not be covered by [facility]. Started feeling better after a week or 2 after the last visit. SE appetite increased. No nausea. Not crying anymore. Sleeping well and no longer ruminates. Plan: Continue Trintellix but DC Rexulti due to weight gain.  Continue lamotrigine  03/06/2022 appointment with the following noted: Last 2 weeks has been very sad and nervous. Trintellix was increased to 20 mg daily  03/10/2022 complaining of ongoing depression, feeling jittery, negative thinking, early morning awakening, ruminating about past decisions.   Wanting to consider [place] because she is desperate for improvement. Plan we will schedule urgent appointment  03/15/22 urgent appt noted:  seen with [name] Very depressed, worst ever.  Don't want to be alone, ongoing worry, ruminating on past decisions.  Racing negative thoughts.  No motivation.  Trouble staying asleep.  Getting 4 hours and used to 9 hours. Dread getting up. Using Xanax Increased Trintellix to 20 mg 9 days ago. H agrees sleep is critical. Isolating.   Need to be better by July 11. Plan: Clonazepam 1 mg HS. Continue trintellix 20 mg daily she is only been on this dose 9 days and it needs more time. Re lamotrigine and increase  to 150 mg daily.Arman Filter 1.5 mg every other day Option [place]  03/24/22 appt noted: So much better.  Thinking straighter and not negative and not sad.  Sleeping better 8-9 hours.. No crying. H notices progressively better every day.  Mind clarity is much better.   No SE noted.  No hangover.  No nausea. Found a therapist, Santiago Bumpers.   Feels well enough to go to [place] and kind of excited.  Back July 16.   Plan: Clonazepam 1 mg HS. This resolved insomnia without SE Continue trintellix 20 mg daily she is only been on this dose 20 days and it needs more time. Re lamotriginecontinue 150 mg daily.Arman Filter 1.5 mg every other day Option [place]  04/12/22 appt noted: Great.  Went to [place].  Was herself and enjoyed it.  Anxiety was managed.   [facility] and liked her. 3# wt gain.   Patient reports stable mood and denies depressed or irritable moods.  Patient denies any recent difficulty with anxiety.  Patient denies difficulty with sleep initiation or maintenance. Denies appetite disturbance.  Patient reports that energy and motivation have been good.  Patient denies any difficulty with concentration.  Patient denies any suicidal ideation. Sleeping well than not anxious. Plan: Trintellix 20  07/10/22 TC depression returned  recently.  08/15/22 appt noted: Still blunted  and diminished motivation.  Some days better than others.  Going to gym.  Partially better.   Still has a lot of anxiety and doesn't want to be alone. Functioning but not normal.  Dreads parties.   SE tremor for a couple of week.sleep great.  Dep 7/10. Current Vraylar 1.5 mg daily, increase lamotrigine 200 mg daily, Trintellix 20 , clonazepam 0.5 mg HS Plan: Reduce Clonazepam 1/2  mg HS. This resolved insomnia without SE Continue trintellix 20 mg daily only mildly effective. lamotrigine continue 200 mg daily.[place] DT lost response Auvelity Stop Vraylar After Thanksgiving stop Trintellix Wait 3 days then [employer] 1 in the morning for 1 week,  and if no side effects then increase Auvelity to 1 in the AM and 1 in the PM  09/04/22 TC: Complaining of persistent depression and wanting to do something else to help.  Added lithium CR 300 mg nightly  09/11/2022 phone call complaining of no improvement with Auvelity and lithium.  Appointment was moved up.  09/13/22 appt noted: Nothing changed. No better and no worse.  Awakens sad and tearful.  Sleeps well. Pushing herself to the gym.  Has a trainer. On clonazepam 0.5 mg HS She remains anxious when alone for no apparent reason.  Feelings of inferiority.  She is normally extroverted but now she is wanting to isolate from people.  All tasks seem monumental.  No enjoyment and not looking forward to anything.  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  She remains generally worried and anxious which is not typical for her. Plan: Clonazepam 1/2  mg HS. This resolved insomnia without SE Stop Auvelity Start nortriptyline 1 of the 25 mg capsules at night for 4 nights then 2 at night for 4 nights then 3 at night, then wait 1 week and get the blood test in the morning at [facility] Reduce lamotrigine to 1 and 1/2 tablets daily  Lamotrigine has not been helpful to him 100 mg daily.  Start reducing lamotrigine 150 mg  daily and will continue to taper at next appointment.  09/27/21 urgent appt :  she wanted urgent appt bc desperate to feel better.  Seen with H Called at least twice since here for the same reasons of depression. Not sleeping well even with clonazepam 1 mg HS. Doesn't want to be alone. Shakey.   On lithium 300 mg daily, nortriptyline 75 mg HS.   Doesn't want to live like this but not acutely suicidal. Tolerating meds.  Past Psychiatric Medication Trials: Sertraline, fluoxetine, citalopram 50, nefazodone, mirtazapine, Lexapro 30 weight gain, Viibryd 30, Wellbutrin 300, Duloxetine 90 SE tremor Trintellix 20 little response Auvelity twice daily for 3 weeks no response  Lamotrigine 200 no response Abilify, Rexulti 0.5 Vraylar 1.5 daily tremor and lost response Lithium 300  methyl folate buspirone,  alprazolam, clonazepam, Under the care of [facility] since January 2001  Sister does well on lamotrigine.  Review of Systems:  Review of Systems  Constitutional:  Positive for fatigue.  Cardiovascular:  Negative for palpitations.  Gastrointestinal:  Negative for constipation and nausea.  Neurological:  Negative for dizziness and tremors.  Psychiatric/Behavioral:  Positive for dysphoric mood. Negative for behavioral problems, confusion, decreased concentration, hallucinations, self-injury, sleep disturbance and suicidal ideas. The patient is nervous/anxious. The patient is not hyperactive.     Medications: I have reviewed the patient's current medications.  Current Outpatient Medications  Medication Sig Dispense Refill   clonazePAM (KLONOPIN) 0.5 MG tablet Take 1-2 tablets (0.5-1 mg total) by mouth at bedtime. 45 tablet 1   lamoTRIgine (LAMICTAL) 100 MG tablet Take 1.5 tablets (150 mg total) by mouth daily. 45 tablet 1   lithium carbonate (LITHOBID) 300 MG ER tablet Take 1 tablet (300 mg total) by mouth at bedtime. 90 tablet 0   nortriptyline (PAMELOR) 25 MG capsule  1 at night for 4 nights, then 2 at night for 4 nights then 3 at night (Patient taking differently: Takes 3 at night) 90 capsule 1   atorvastatin (LIPITOR) 20 MG tablet Take 20 mg by mouth.      calcium citrate-vitamin D (CITRACAL+D) 315-200 MG-UNIT tablet Take by mouth.     fluticasone (FLONASE) 50 MCG/ACT nasal spray fluticasone propionate 50 mcg/actuation nasal spray,suspension     levothyroxine (SYNTHROID, LEVOTHROID) 50 MCG tablet Take 50 mcg by mouth daily before breakfast.     raloxifene (EVISTA) 60 MG tablet raloxifene 60 mg tablet     XIIDRA 5 % SOLN      No current facility-administered medications for this visit.    Medication Side Effects: None  Allergies:  Allergies  Allergen Reactions   Hydrocodone-Acetaminophen Other (See Comments)    Passes out   Morphine Other (See Comments)    Passes out    Past Medical History:  Diagnosis Date   GAD (generalized anxiety disorder)    Hyperlipidemia    Hypothyroidism    Osteopenia    Sinusitis     Family History  Problem Relation Age of Onset   Arthritis Mother    COPD Mother    Hypercholesterolemia Mother    Dementia Mother    Stroke Mother    Prostate cancer Brother    Cancer Maternal Grandmother        Oral    Social History   Socioeconomic History   Marital status: Married    Spouse name: Not on file   Number of children: Not on file   Years of education: Not on file   Highest education level: Not on file  Occupational History   Not on file  Tobacco Use   Smoking  status: Never   Smokeless tobacco: Never  Vaping Use   Vaping Use: Never used  Substance and Sexual Activity   Alcohol use: Not Currently    Comment: social   Drug use: Never   Sexual activity: Not on file  Other Topics Concern   Not on file  Social History Narrative   Married.   1 child. 2 grandchildren.   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   Enjoys exercising, spending time with family   Social Determinants of Health    Financial Resource Strain: Low Risk  (02/24/2022)   Overall Financial Resource Strain (CARDIA)    Difficulty of Paying Living Expenses: Not hard at all  Food Insecurity: No Food Insecurity (02/24/2022)   Hunger Vital Sign    Worried About Running Out of Food in the Last Year: Never true    Ran Out of Food in the Last Year: Never true  Transportation Needs: No Transportation Needs (02/24/2022)   PRAPARE - Hydrologist (Medical): No    Lack of Transportation (Non-Medical): No  Physical Activity: Sufficiently Active (02/24/2022)   Exercise Vital Sign    Days of Exercise per Week: 5 days    Minutes of Exercise per Session: 90 min  Stress: No Stress Concern Present (02/24/2022)   [place]    Feeling of Stress : Not at all  Social Connections: Not on file  Intimate Partner Violence: Not on file    Past Medical History, Surgical history, Social history, and Family history were reviewed and updated as appropriate.   3 grandsons.  Please see review of systems for further details on the patient's review from today.   Objective:   Physical Exam:  There were no vitals taken for this visit.  Physical Exam Constitutional:      General: She is not in acute distress.    Appearance: She is well-developed.  Musculoskeletal:        General: No deformity.  Neurological:     Mental Status: She is alert and oriented to person, place, and time.     Motor: No tremor.     Coordination: Coordination normal.     Gait: Gait normal.  Psychiatric:        Attention and Perception: She is attentive. She does not perceive auditory hallucinations.        Mood and Affect: Mood is anxious and depressed. Affect is not labile, blunt, angry, tearful or inappropriate.        Speech: Speech normal.        Behavior: Behavior normal.        Thought Content: Thought content normal. Thought content is not delusional.  Thought content does not include homicidal or suicidal ideation. Thought content does not include suicidal plan.        Cognition and Memory: Cognition and memory normal.        Judgment: Judgment normal.     Comments: Insight intact. No auditory or visual hallucinations. No delusions.  No change in mood with change in antidepressant.     Lab Review:  No results found for: "NA", "K", "CL", "CO2", "GLUCOSE", "BUN", "CREATININE", "CALCIUM", "PROT", "ALBUMIN", "AST", "ALT", "ALKPHOS", "BILITOT", "GFRNONAA", "GFRAA"  No results found for: "WBC", "RBC", "HGB", "HCT", "PLT", "MCV", "MCH", "MCHC", "RDW", "LYMPHSABS", "MONOABS", "EOSABS", "BASOSABS"  No results found for: "POCLITH", "LITHIUM"   No results found for: "PHENYTOIN", "PHENOBARB", "VALPROATE", "CBMZ"   .res Assessment: Plan:  Recurrent major depression resistant to treatment [facility])  Generalized anxiety disorder  Insomnia due to mental condition   Greater than 50% of 50-minute face to face time with patient was spent on counseling and coordination of care. Seen emergently with pt and her H Recent worsening mood as noted before. Dramatically better mood and anxiety since adding Vraylar and increasing Tritellix  .  Then lost benefit. No benefit from Auvelity twice daily for 3 weeks.  We discussed the short-term risks associated with benzodiazepines including sedation and increased fall risk among others.  Discussed long-term side effect risk including dependence, potential withdrawal symptoms, and the potential eventual dose-related risk of dementia.  But recent studies from 2020 dispute this association between benzodiazepines and dementia risk. Newer studies in 2020 do not support an association with dementia.  Extensive disc of options.  She has failed multiple meds as noted above including all the usual categories of antidepressants with the exception of tricyclic antidepressants and MAO inhibitors.  The next best tolerated  option would be nortriptyline.  Discussed this at length as well as side effects.  Will get a blood level to optimize dosing.  Consider Genesight  Clonazepam 1 mg HS. Insomnia worse with depression  contginue nortriptyline 75 mg HS get the blood test in the morning at [facility]  Lamotrigine has not been helpful to her so will start a taper.   Discussed Spravato at length.  Describes side effects to her.  Described the process of administration and the commitment of time.  Describes a temporary benefit unless continued consistently.  If she does not improve with nortriptyline it is recommended that she pursue Spravato administration.  Gave her a brochure to read. Disc TMS option in detail including experimental one week study at [facility]. Disc ECT in detail.  Rec counseling .  She has a scheduled appt.  FU 3 weeks Lynder Parents, MD, DFAPA  Please see After Visit Summary for patient specific instructions.  Lynder Parents, MD, DFAPA   Future Appointments  Date Time Provider [place]  10/20/2022  2:00 PM Cottle, Billey Co., MD CP-[facility] None  10/24/2022 11:30 AM Cottle, Billey Co., MD [facility] None      No orders of the defined types were placed in this encounter.      -------------------------------

## 2022-09-28 ENCOUNTER — Telehealth: Payer: Self-pay | Admitting: Psychiatry

## 2022-09-28 DIAGNOSIS — F331 Major depressive disorder, recurrent, moderate: Secondary | ICD-10-CM | POA: Diagnosis not present

## 2022-09-28 NOTE — Telephone Encounter (Signed)
Pt's husband called.  Pt was here yesterday to see Dr. Clovis Pu.  They need the treatment notes for the last 6 months and the last 6 months of meds faxed to the [place] [facility] [place], [employer].  Fax number [phone]  Pls contact patient once records have been sent.

## 2022-09-29 NOTE — Telephone Encounter (Signed)
Leda Gauze is taking care of this.

## 2022-10-02 ENCOUNTER — Telehealth: Payer: Self-pay | Admitting: Psychiatry

## 2022-10-02 NOTE — Telephone Encounter (Signed)
Referral was faxed to [facility] for [place]

## 2022-10-03 ENCOUNTER — Telehealth: Payer: Self-pay

## 2022-10-03 ENCOUNTER — Other Ambulatory Visit: Payer: Self-pay

## 2022-10-03 LAB — NORTRIPTYLINE (AVENTYL), SERUM: Nortriptyline (Aventyl), Serum: 86 ng/mL (ref 50–150)

## 2022-10-03 MED ORDER — AMITRIPTYLINE HCL 50 MG PO TABS: 100.0000 mg | ORAL_TABLET | Freq: Every day | ORAL | 0 refills | Status: DC

## 2022-10-03 NOTE — Telephone Encounter (Signed)
Pt informed rx sent.

## 2022-10-03 NOTE — Telephone Encounter (Signed)
Her nortriptyline level was 86 in the normal range is 50 to 150 mg.  I think she may be able to get a little more benefit if she and can increase the nortriptyline to 100 mg at night.  She currently has 25 mg capsules so she can use up what she has taking 4 at night.  When she runs out we can send in a prescription for 50 mg capsules and it would be 2 at night. Just so she is aware, We faxed the letter of referral to [facility] psychiat[facility]

## 2022-10-04 ENCOUNTER — Telehealth: Payer: Self-pay | Admitting: Psychiatry

## 2022-10-04 NOTE — Telephone Encounter (Signed)
I dictated a letter that was sent.  I do not know if her records were sent with the letter.  If not you can copy my last progress note and that contains more than 6 months of records.  That's one of the reasons I write my notes this way

## 2022-10-04 NOTE — Telephone Encounter (Signed)
[facility] called and said that they need 6 months worth of records sent to them before they can look at the referrall . They need to gather enough information to determine if they will see her. Please send in those records

## 2022-10-04 NOTE — Telephone Encounter (Signed)
Please review

## 2022-10-05 ENCOUNTER — Other Ambulatory Visit: Payer: Self-pay | Admitting: Psychiatry

## 2022-10-05 DIAGNOSIS — F331 Major depressive disorder, recurrent, moderate: Secondary | ICD-10-CM

## 2022-10-07 ENCOUNTER — Other Ambulatory Visit: Payer: Self-pay | Admitting: Psychiatry

## 2022-10-16 ENCOUNTER — Telehealth: Payer: Self-pay | Admitting: Psychiatry

## 2022-10-16 ENCOUNTER — Other Ambulatory Visit: Payer: Self-pay | Admitting: Psychiatry

## 2022-10-16 DIAGNOSIS — F339 Major depressive disorder, recurrent, unspecified: Secondary | ICD-10-CM

## 2022-10-16 DIAGNOSIS — F5105 Insomnia due to other mental disorder: Secondary | ICD-10-CM

## 2022-10-16 MED ORDER — ESCITALOPRAM OXALATE 20 MG PO TABS: ORAL_TABLET | ORAL | 1 refills | Status: DC

## 2022-10-16 MED ORDER — ALPRAZOLAM 1 MG PO TABS: ORAL_TABLET | ORAL | 0 refills | Status: DC

## 2022-10-16 NOTE — Telephone Encounter (Signed)
RTC  Sched to go next Monday-Friday to [facility] for [facility] study, [facility] protocol.   On amitriptyline and has shakes and really nervous.  Trouble going to sleep without 2 clonazepam.  Asks for an alternative. Crying all the time and that's not me.  A/P:  can't switch clonazepam to non-BZ without WD Will switch antidepressants to a better tolerated med since is getting TMS next week.  Need to switch quickly before [place] starts.  Disc SE  Stop clonazepam and start alprazolam 1-1.5 mg HS fro TR insomnia. Reduce amitriptyline 1 nightly for 5 nights and stop it. Start 1/2 Lexapro for 5 days then 1 daily. If shakes are not better then DC lithium Cont lamotrigine for now  Lynder Parents, MD, DFAPA

## 2022-10-16 NOTE — Telephone Encounter (Signed)
Pt reporting not resting at night, very shaky and not feeling any better. RTC [phone]

## 2022-10-16 NOTE — Telephone Encounter (Signed)
Pt stated she has not been able to sleep and its causing her depression to be much worse.She is crying constantly,and can't write a check due to shaking so bad.She said this is the worse she has ever been.At night she takes  amitriptyline 50 mg 2 tabs Klonopin 0.5 mg 2 tabs Lithium 300 mg 1 tab

## 2022-10-17 DIAGNOSIS — E039 Hypothyroidism, unspecified: Secondary | ICD-10-CM | POA: Diagnosis not present

## 2022-10-17 NOTE — Telephone Encounter (Signed)
I spoke with her yesterday but I cannot find the note.  She is going to the [facility] next week for [facility].  She is not benefiting from the amitriptyline and I instructed her to reduce the amitriptyline to 1 at night and start one half Lexapro daily for 5 days then increase the Lexapro to 1 daily and stop the amitriptyline.  It is unlikely that 1 day of med change is having this effect.  She may be getting a tremor from the lithium.  Tell her just to stop the lithium.  She needs to continue with the med changes noted above.

## 2022-10-17 NOTE — Telephone Encounter (Signed)
Pt informed

## 2022-10-19 NOTE — Progress Notes (Signed)
Nortriptyline level 86 on 75 mg daily.

## 2022-10-20 ENCOUNTER — Telehealth: Payer: Self-pay

## 2022-10-20 ENCOUNTER — Ambulatory Visit: Payer: Medicare Other | Admitting: Psychiatry

## 2022-10-20 NOTE — Telephone Encounter (Signed)
Patient said her daughter's father died and the funeral is tomorrow. She is extremely anxious about that and wants to know if she can take another 1/2 alprazolam. She is also anxious about the [place] in [facility] on Monday. She did stop the clonazepam and she said the alprazolam did not help with sleep last night, but she is very anxious about the funeral.   I reviewed her meds. This is from your note on 1/23.    She is not benefiting from the amitriptyline and I instructed her to reduce the amitriptyline to 1 at night and start one half Lexapro daily for 5 days then increase the Lexapro to 1 daily and stop the amitriptyline.   She stopped the lithium d/t tremor but the tremor continues.  She said she did not read the instructions correctly as written above. She said she stopped the amitriptyline 5 days ago, but had not started the Lexapro. Today she took a whole 20 mg Lexapro. Told her she was supposed to only take 1/2 tab for 5 days and she was supposed to start it while weaning off the amitriptyline.

## 2022-10-20 NOTE — Telephone Encounter (Signed)
On 10/16/2022 she called complaining of not being able to sleep on clonazepam 1 mg at night as well as other anxious and depressive symptoms as noted.  She was to stop clonazepam and switch to alprazolam 1 mg tablet 1-1-1/2 at night for sleep.  Yes she can take 1/2 tablet of alprazolam when she goes to the funeral as long as she is not driving.  She stopped the amitriptyline but did not start the Lexapro as directed.  She starts [place] on Monday.  Therefore just start the Lexapro at 1/2 tablet daily and then follow up with me after she finishes [place].  So stay at Lexapro one half of a 20 mg tablet until we talk again.

## 2022-10-20 NOTE — Telephone Encounter (Signed)
Patient notified of recommendations. 

## 2022-10-24 ENCOUNTER — Ambulatory Visit: Payer: 59 | Admitting: Psychiatry

## 2022-10-27 DIAGNOSIS — F331 Major depressive disorder, recurrent, moderate: Secondary | ICD-10-CM | POA: Insufficient documentation

## 2022-10-30 ENCOUNTER — Telehealth: Payer: Self-pay | Admitting: Psychiatry

## 2022-10-30 NOTE — Telephone Encounter (Signed)
Spoke to pt and she stated after [place] treatments she is still struggling with crying and depression. She is asking to be worked in or seen asap.I will add to cancellation list.She said that you told her to stay on lexapro 10 mg until she see's you but instructions say increase to 20 mg after 5 days please advise.

## 2022-10-30 NOTE — Telephone Encounter (Signed)
Andrea [name] called at 9:45.  She finished 50 treatments of [name] on Friday.  She needs her medications adjusted.  Please call her to discuss.  She made appt for 3/12 which was the first available.  She is hoping she can be seen sooner.  But again call her to discuss medication adjustments.

## 2022-10-31 NOTE — Telephone Encounter (Signed)
Schedule her Friday at 11 am

## 2022-11-01 ENCOUNTER — Telehealth: Payer: Self-pay | Admitting: Psychiatry

## 2022-11-01 NOTE — Telephone Encounter (Signed)
I have scheduled Andrea [name] for Friday at 11:00

## 2022-11-03 ENCOUNTER — Ambulatory Visit (INDEPENDENT_AMBULATORY_CARE_PROVIDER_SITE_OTHER): Payer: Medicare Other | Admitting: Psychiatry

## 2022-11-03 ENCOUNTER — Other Ambulatory Visit: Payer: Self-pay | Admitting: Psychiatry

## 2022-11-03 ENCOUNTER — Encounter: Payer: Self-pay | Admitting: Psychiatry

## 2022-11-03 DIAGNOSIS — F339 Major depressive disorder, recurrent, unspecified: Secondary | ICD-10-CM | POA: Diagnosis not present

## 2022-11-03 DIAGNOSIS — F331 Major depressive disorder, recurrent, moderate: Secondary | ICD-10-CM | POA: Diagnosis not present

## 2022-11-03 DIAGNOSIS — F411 Generalized anxiety disorder: Secondary | ICD-10-CM

## 2022-11-03 DIAGNOSIS — F5105 Insomnia due to other mental disorder: Secondary | ICD-10-CM | POA: Diagnosis not present

## 2022-11-03 MED ORDER — LAMOTRIGINE 100 MG PO TABS: 100.0000 mg | ORAL_TABLET | Freq: Every day | ORAL | 1 refills | Status: DC

## 2022-11-03 MED ORDER — ALPRAZOLAM 1 MG PO TABS: ORAL_TABLET | ORAL | 1 refills | Status: DC

## 2022-11-03 NOTE — Patient Instructions (Signed)
Increase Lexapro 20 mg dailly Increase alprazolam to 0.5 mg three times daily and 1 mg nightly for anxiety and sleep Pramipexole 0.25 twice daily for 5 days and if no response then 0.5 mg twice daily Reduce lamotrigine 100 mg for 2 weeks then 50 mg daily for 2 weeks then stop it.

## 2022-11-03 NOTE — Progress Notes (Signed)
Andrea [name] [id number] 14-Oct-1950 72 y.o.  Subjective:   Patient ID:  Andrea [name] is a 72 y.o. (DOB 1951-08-22) female.  Chief Complaint:  Chief Complaint  Patient presents with   Follow-up   Depression   Fatigue    HPI Andrea [name] presents to the office today for follow-up of MDE and anxiety disorder.  seen 09/2019.  No meds were changed.  Been on Lexapro 20 since early 2017.  02/27/2020 phone call from patient: Pt experiencing more anxiety than she normally has. Pt would like to know what are some options that she has as far as her medication. Pt will be in an appt  MD response: If the anxiety is just occasional then using alprazolam as needed would be an okay thing to do.  If it is been fairly persistent for a couple of weeks or more than the simplest solution would be to increase the Lexapro to 1-1/2 tablets daily.  Historically she has done well on Lexapro 20 mg daily for an extended period of time.  If she is under temporary stress then once that is resolved we could drop it back to 1 daily.  If this does not work as expected then she should schedule an earlier appointment. Pt response: Patient called back and she's been having temporary stress, but it's not bad. Things are great, she's working out and feeling okay for the most part. It's been going on for a bit so she agrees to the increase Lexapro 20 mg 1.5 tablets daily. She has been taking alprazolam at hs occasionally to help sleep better and it does help.   12/06/2020 appointment with the following noted:  No covid.  H Covid and recovered.   Had increased Lexapro to 30 mg for 90 days and saw a difference but started seeing weight gain and was doing oK and able to drop back and been OK. Still renovating a house for a year.  Sold her house and mourns the house. Calmed down.  Kept 3 gkids and dogs 10 days.   [place] for new house May 1. Rare alprazolam for HS. Stress moving to condo and would have crying spell over the move  and some problems she was running into dealing with it.  Hard with change.  But taking time to adjust to the idea of moving from her house of 23 years.  Initially refused but he left it up to her and she's come to peace about it.  Andrea [name].   Good response to medication and no SE.  Exercising is good medicine.  If doesn't then doesn't feel as good. Satisfied. Plan: no med changes  08/01/21  appt noted:  seen with [name] died 2023/08/10.  A lot of ups and downs per D.  Hard dips. Got worse when retired and dealing with the new  house.  Had trouble getting rid of things from the old house causing regrets over sellling the house.  D notes doesn't do well with instability  and stressors.  Ruminates on past mistakes. Blamed [name] for the  difficulty renovating the house.  Neg self talk and beats herself up. When not enough sleep will get depressed.  Does better if busy.   Dreads things.  Patient denies difficulty with sleep initiation or maintenance. Denies appetite disturbance.  Patient reports that energy and motivation have been good. Patient denies any difficulty with concentration.  Patient denies any suicidal ideation.  Plan: Abilify 2.5 mg daily  for a week, then 5 mg daily. Continue Lexapro 20 mg daily  08/31/2021 appt noted: It worked immediately.  Insomnia with EMA. Normally 9 hours.  It's like I'm high.  Not tired.  Average 3-4 hours. Not depressed and no crazy negative thoughts.  Good response.  Energetic but up in middle of night. Plan: Okay to stop Abilify because of insomnia.   09/29/2021 appointment with the following noted: Parties were great.  Sleep problem resolved.  [place] lot of rest at the beach. Maybe the last week or 2 less motivation and socialization.  Not laying in the bed.  Just not as good as she was.  Not as outgoing as normal. No alcohol now.  Only had 1-2 at night.  Not ruminating.   Willing to restart Abilify at lower dose 2 mg daily. 3 grandsons  Plan: Relapse  depression recurs she can resume Abilify  but lower dose 2 mg daily or every other day to try to avoid the insomnia where she can contact our office and we can discussed the possibility of an alternative antidepressant such as Trintellix or duloxetine.  12/28/2021 appointment noted: Several phone calls since she was here.  Abilify plus Lexapro failed.  Switch to duloxetine 90 mg which she started 12/02/2018 2023 Started lamotrigine. Also started Rexulti Sister has had cycling depression that has responded well to lamotrigine with poor response to SSRIs [name] with a little dementia. SE tremor 90 mg duloxetine, ? Wt gain over a few weeks.. Exercise and wt control Social isolation, low self esteem, worry about the way she looks and not usually that way. Crying better with duloxetine.  More dependent on H.   Anhedonia.  Black hole.  Never this low. No SI Having to use Xanax.   Asks about day treatment and [place] Plan: rec TMS Continue lamotrigine as prescribed Increase Rexulti to 1 mg daily Switch to Trintellix:  reduce duloxetine to 2 of the 30 mg capsules and start Trintellix 5 mg daily for 5 days,  Then Increase Trintellix to 10 mg daily and reduce duloxetine to 1 capsule for 1 week. Then stop duloxetine.  02/15/22 appt noted: On Trintellix , Rexulti 1 and lamotrigine I feel good.  Walks 3 miles daily is great for mental health. Andrea [name] would not be covered by [facility]. Started feeling better after a week or 2 after the last visit. SE appetite increased. No nausea. Not crying anymore. Sleeping well and no longer ruminates. Plan: Continue Trintellix but DC Rexulti due to weight gain.  Continue lamotrigine  03/06/2022 appointment with the following noted: Last 2 weeks has been very sad and nervous. Trintellix was increased to 20 mg daily  03/10/2022 complaining of ongoing depression, feeling jittery, negative thinking, early morning awakening, ruminating about past decisions.  Wanting to consider [place]  because she is desperate for improvement. Plan we will schedule urgent appointment  03/15/22 urgent appt noted:  seen with Andrea [name] Very depressed, worst ever.  Don't want to be alone, ongoing worry, ruminating on past decisions.  Racing negative thoughts.  No motivation.  Trouble staying asleep.  Getting 4 hours and used to 9 hours. Dread getting up. Using Xanax Increased Trintellix to 20 mg 9 days ago. H agrees sleep is critical. Isolating.   Need to be better by July 11. Plan: Clonazepam 1 mg HS. Continue trintellix 20 mg daily she is only been on this dose 9 days and it needs more time. Re lamotrigine and increase to 150 mg daily.Marland Kitchen  Vraylar 1.5 mg every other day Option TMS  03/24/22 appt noted: So much better.  Thinking straighter and not negative and not sad.  Sleeping better 8-9 hours.. No crying. H notices progressively better every day.  Mind clarity is much better.   No SE noted.  No hangover.  No nausea. Found a therapist, Santiago Bumpers.   Feels well enough to go to [place] and kind of excited.  Back July 16.   Plan: Clonazepam 1 mg HS. This resolved insomnia without SE Continue trintellix 20 mg daily she is only been on this dose 20 days and it needs more time. Re lamotriginecontinue 150 mg daily.Arman Filter 1.5 mg every other day Option [place]  04/12/22 appt noted: Great.  Went to [place].  Was herself and enjoyed it.  Anxiety was managed.   [facility] and liked her. 3# wt gain.   Patient reports stable mood and denies depressed or irritable moods.  Patient denies any recent difficulty with anxiety.  Patient denies difficulty with sleep initiation or maintenance. Denies appetite disturbance.  Patient reports that energy and motivation have been good.  Patient denies any difficulty with concentration.  Patient denies any suicidal ideation. Sleeping well than not anxious. Plan: Trintellix 20  07/10/22 TC depression returned recently.  08/15/22 appt  noted: Still blunted  and diminished motivation.  Some days better than others.  Going to gym.  Partially better.   Still has a lot of anxiety and doesn't want to be alone. Functioning but not normal.  Dreads parties.   SE tremor for a couple of week.sleep great.  Dep 7/10. Current Vraylar 1.5 mg daily, increase lamotrigine 200 mg daily, Trintellix 20 , clonazepam 0.5 mg HS Plan: Reduce Clonazepam 1/2  mg HS. This resolved insomnia without SE Continue trintellix 20 mg daily only mildly effective. lamotrigine continue 200 mg daily.[place] DT lost response Auvelity Stop Vraylar After Thanksgiving stop Trintellix Wait 3 days then [employer] 1 in the morning for 1 week,  and if no side effects then increase Auvelity to 1 in the AM and 1 in the PM  09/04/22 TC: Complaining of persistent depression and wanting to do something else to help.  Added lithium CR 300 mg nightly  09/11/2022 phone call complaining of no improvement with Auvelity and lithium.  Appointment was moved up.  09/13/22 appt noted: Nothing changed. No better and no worse.  Awakens sad and tearful.  Sleeps well. Pushing herself to the gym.  Has a trainer. On clonazepam 0.5 mg HS She remains anxious when alone for no apparent reason.  Feelings of inferiority.  She is normally extroverted but now she is wanting to isolate from people.  All tasks seem monumental.  No enjoyment and not looking forward to anything.  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  She remains generally worried and anxious which is not typical for her. Plan: Clonazepam 1/2  mg HS. This resolved insomnia without SE Stop Auvelity Start nortriptyline 1 of the 25 mg capsules at night for 4 nights then 2 at night for 4 nights then 3 at night, then wait 1 week and get the blood test in the morning at [facility] Reduce lamotrigine to 1 and 1/2 tablets daily  Lamotrigine has not been helpful to him 100 mg daily.  Start reducing lamotrigine 150 mg daily and will continue to  taper at next appointment.  09/27/21 urgent appt :  she wanted urgent appt bc desperate to  feel better.  Seen with H Called at least twice since here for the same reasons of depression. Not sleeping well even with clonazepam 1 mg HS. Doesn't want to be alone. Shakey.   On lithium 300 mg daily, nortriptyline 75 mg HS.   Doesn't want to live like this but not acutely suicidal. Tolerating meds. Plan: pursue [place] protocol [place] at [facility] bc faster optin than alternatives  10/30/22 TC: finished 50 TMS tx in the week and wants appt ASAP bc still struggling with crying and depresssion..  11/03/22 urgent appt : Completed [place] last week. Tue-Thur better days and then Friday nose-dived. Not done well since got back.   H notes high anxiety and crying in AM and PM and doesn't want to be alone.  Xanxax seems to help. Xanax helps and taking 0.5 mg AM and 1 mg HS H notes memory is sharper and she's more alert after treatment. Very anxioius all the time.  Get up sad.  Exercising. Sleep good with Xanax 1 mg HS.  ECT-MADRS    Flowsheet Row Office Visit from 11/03/2022 in [facility]  MADRS Total Score 40      PHQ2-9    Flowsheet Row Clinical Support from 02/24/2022 in [place] at [facility] Video Visit from 06/14/2021 in [place] at [place]  PHQ-2 Total Score 0 0  PHQ-9 Total Score -- 0      Flowsheet Row ED from 08/11/2022 in [facility] at [facility]  ED from 03/21/2022 in [facility] at [place] No Risk No Risk        Past Psychiatric Medication Trials: Sertraline, fluoxetine, citalopram 50, nefazodone, Lexapro 30 weight gain,  Viibryd 30, Wellbutrin 300, Duloxetine 90 SE tremor, mirtazapine,  Trintellix 20 little response Auvelity twice daily for 3 weeks no response Nortriptyline 75 level 86 NR Auvelity BID  3 weeks NR  Lamotrigine 200 no response Abilify,  Rexulti 0.5 Vraylar 1.5 daily tremor and lost response Lithium 300 shakes  methyl folate buspirone,  alprazolam, clonazepam, Under the care of [facility] since January 2001  Sister does well on lamotrigine.  Review of Systems:  Review of Systems  Constitutional:  Positive for appetite change and fatigue.  Cardiovascular:  Negative for palpitations.  Gastrointestinal:  Negative for nausea.  Neurological:  Negative for dizziness, tremors and light-headedness.  Psychiatric/Behavioral:  Positive for dysphoric mood. Negative for behavioral problems, confusion, decreased concentration, hallucinations, self-injury, sleep disturbance and suicidal ideas. The patient is nervous/anxious. The patient is not hyperactive.     Medications: I have reviewed the patient's current medications.  Current Outpatient Medications  Medication Sig Dispense Refill   atorvastatin (LIPITOR) 20 MG tablet Take 20 mg by mouth.      calcium citrate-vitamin D (CITRACAL+D) 315-200 MG-UNIT tablet Take by mouth.     escitalopram (LEXAPRO) 20 MG tablet 1/2 tablet daily for 5 days then 1 tablet daily (Patient taking differently: Take 10 mg by mouth daily. 1/2 tablet daily for 5 days then 1 tablet daily) 30 tablet 1   fluticasone (FLONASE) 50 MCG/ACT nasal spray fluticasone propionate 50 mcg/actuation nasal spray,suspension     levothyroxine (SYNTHROID, LEVOTHROID) 50 MCG tablet Take 50 mcg by mouth daily before breakfast.     raloxifene (EVISTA) 60 MG tablet raloxifene 60 mg tablet     XIIDRA 5 % SOLN      ALPRAZolam (XANAX) 1 MG tablet 1-1 and  1/2 tablets for insomnia.  Stop clonazepam. 75 tablet 1   lamoTRIgine (LAMICTAL) 100 MG tablet Take 1 tablet (100 mg total) by mouth daily. 30 tablet 1   No current facility-administered medications for this visit.    Medication Side Effects: None  Allergies:  Allergies  Allergen Reactions   Hydrocodone-Acetaminophen Other (See Comments)    Passes out    Morphine Other (See Comments)    Passes out    Past Medical History:  Diagnosis Date   GAD (generalized anxiety disorder)    Hyperlipidemia    Hypothyroidism    Osteopenia    Sinusitis     Family History  Problem Relation Age of Onset   Arthritis Mother    COPD Mother    Hypercholesterolemia Mother    Dementia Mother    Stroke Mother    Prostate cancer Brother    Cancer Maternal Grandmother        Oral    Social History   Socioeconomic History   Marital status: Married    Spouse name: Not on file   Number of children: Not on file   Years of education: Not on file   Highest education level: Not on file  Occupational History   Not on file  Tobacco Use   Smoking status: Never   Smokeless tobacco: Never  Vaping Use   Vaping Use: Never used  Substance and Sexual Activity   Alcohol use: Not Currently    Comment: social   Drug use: Never   Sexual activity: Not on file  Other Topics Concern   Not on file  Social History Narrative   Married.   1 child. 2 grandchildren.   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   Enjoys exercising, spending time with family   Social Determinants of Health   Financial Resource Strain: Low Risk  (02/24/2022)   Overall Financial Resource Strain (CARDIA)    Difficulty of Paying Living Expenses: Not hard at all  Food Insecurity: No Food Insecurity (02/24/2022)   Hunger Vital Sign    Worried About Running Out of Food in the Last Year: Never true    Ran Out of Food in the Last Year: Never true  Transportation Needs: No Transportation Needs (02/24/2022)   PRAPARE - Hydrologist (Medical): No    Lack of Transportation (Non-Medical): No  Physical Activity: Sufficiently Active (02/24/2022)   Exercise Vital Sign    Days of Exercise per Week: 5 days    Minutes of Exercise per Session: 90 min  Stress: No Stress Concern Present (02/24/2022)   [place]    Feeling of Stress : Not at all  Social Connections: Not on file  Intimate Partner Violence: Not on file    Past Medical History, Surgical history, Social history, and Family history were reviewed and updated as appropriate.   3 grandsons.  Please see review of systems for further details on the patient's review from today.   Objective:   Physical Exam:  There were no vitals taken for this visit.  Physical Exam Constitutional:      General: She is not in acute distress.    Appearance: She is well-developed.  Musculoskeletal:        General: No deformity.  Neurological:     Mental Status: She is alert and oriented to person, place, and time.     Motor: No tremor.     Coordination: Coordination  normal.     Gait: Gait normal.  Psychiatric:        Attention and Perception: She is attentive. She does not perceive auditory hallucinations.        Mood and Affect: Mood is anxious and depressed. Affect is not labile, blunt, angry, tearful or inappropriate.        Speech: Speech normal.        Behavior: Behavior normal. Behavior is not slowed.        Thought Content: Thought content normal. Thought content is not delusional. Thought content does not include homicidal or suicidal ideation. Thought content does not include suicidal plan.        Cognition and Memory: Cognition and memory normal.        Judgment: Judgment normal.     Comments: Insight intact. No auditory or visual hallucinations. No delusions.  Persistent severe depression and anxiety     Lab Review:  No results found for: "NA", "K", "CL", "CO2", "GLUCOSE", "BUN", "CREATININE", "CALCIUM", "PROT", "ALBUMIN", "AST", "ALT", "ALKPHOS", "BILITOT", "GFRNONAA", "GFRAA"  No results found for: "WBC", "RBC", "HGB", "HCT", "PLT", "MCV", "MCH", "MCHC", "RDW", "LYMPHSABS", "MONOABS", "EOSABS", "BASOSABS"  No results found for: "POCLITH", "LITHIUM"   No results found for: "PHENYTOIN", "PHENOBARB", "VALPROATE",  "CBMZ"   .res Assessment: Plan:    Recurrent major depression resistant to treatment ([place])  Generalized anxiety disorder  Insomnia due to mental condition - Plan: ALPRAZolam (XANAX) 1 MG tablet  Major depressive disorder, recurrent episode, moderate ([place]) - Plan: lamoTRIgine (LAMICTAL) 100 MG tablet   Greater than 50% of 50-minute face to face time with patient was spent on counseling and coordination of care. Seen emergently with pt and her H Recent worsening mood as noted before.  Was better with intensive [place] for 3 days the crashed again.    We discussed the short-term risks associated with benzodiazepines including sedation and increased fall risk among others.  Discussed long-term side effect risk including dependence, potential withdrawal symptoms, and the potential eventual dose-related risk of dementia.  But recent studies from 2020 dispute this association between benzodiazepines and dementia risk. Newer studies in 2020 do not support an association with dementia.  Extensive disc of options for 40 mins.  She has failed multiple meds as noted above including all the usual categories of antidepressants with the exception of  MAO inhibitors.  Or pramipexole. Consider Latuda, Seroquel (not likely to be tolerated DT wt), OFC  Consider Genesight  Clonazepam 1 mg HS. Insomnia worse with depression  Discussed Spravato at length again.  Describes side effects to her.  Described the process of administration and the commitment of time.  Describes a temporary benefit unless continued consistently.  If she does not improve with nortriptyline it is recommended that she pursue Spravato administration.  Gave her a brochure to read.  Disc ECT in detail.  Increase Lexapro 20 mg dailly (should help crying and maybe anxiety but unlikely to resolve depression) Increase alprazolam to 0.5 mg TID and 1 mg HS for severe anxiety. H notes it helps. Pramipexole 0.25 BID for 5 days and if NR then 0.5 mg  Bid off label. Reduce lamotrigine 100 mg for 2 weeks then 50 mg daily for 2 weeks then stop it. She wants to pursue Spravato if pramipexole fails  Rec counseling .  She has a scheduled appt.  FU 3 weeks  Lynder Parents, MD, DFAPA  Please see After Visit Summary for patient specific instructions.  Lynder Parents, MD, DFAPA  Future Appointments  Date Time Provider [place]  12/05/2022 10:30 AM Cottle, Billey Co., MD CP-CP None      No orders of the defined types were placed in this encounter.      -------------------------------me

## 2022-11-06 ENCOUNTER — Other Ambulatory Visit: Payer: Self-pay

## 2022-11-06 ENCOUNTER — Emergency Department
Admission: EM | Admit: 2022-11-06 | Discharge: 2022-11-06 | Disposition: A | Discharge status: Home / Self Care | Payer: Medicare Other | Attending: Emergency Medicine | Admitting: Emergency Medicine

## 2022-11-06 ENCOUNTER — Emergency Department: Payer: Medicare Other

## 2022-11-06 DIAGNOSIS — R079 Chest pain, unspecified: Secondary | ICD-10-CM | POA: Diagnosis not present

## 2022-11-06 DIAGNOSIS — R0789 Other chest pain: Secondary | ICD-10-CM | POA: Insufficient documentation

## 2022-11-06 LAB — CBC
HCT: 38.4 % (ref 36.0–46.0)
Hemoglobin: 13 g/dL (ref 12.0–15.0)
MCH: 32 pg (ref 26.0–34.0)
MCHC: 33.9 g/dL (ref 30.0–36.0)
MCV: 94.6 fL (ref 80.0–100.0)
Platelets: 323 10*3/uL (ref 150–400)
RBC: 4.06 MIL/uL (ref 3.87–5.11)
RDW: 12.2 % (ref 11.5–15.5)
WBC: 8 10*3/uL (ref 4.0–10.5)
nRBC: 0 % (ref 0.0–0.2)

## 2022-11-06 LAB — BASIC METABOLIC PANEL
Anion gap: 9 (ref 5–15)
BUN: 12 mg/dL (ref 8–23)
CO2: 26 mmol/L (ref 22–32)
Calcium: 8.8 mg/dL — ABNORMAL LOW (ref 8.9–10.3)
Chloride: 101 mmol/L (ref 98–111)
Creatinine, Ser: 0.67 mg/dL (ref 0.44–1.00)
GFR, Estimated: >60 mL/min (ref >60)
Glucose, Bld: 102 mg/dL — ABNORMAL HIGH (ref 70–99)
Potassium: 3.8 mmol/L (ref 3.5–5.1)
Sodium: 136 mmol/L (ref 135–145)

## 2022-11-06 LAB — TROPONIN I (HIGH SENSITIVITY)
Troponin I (High Sensitivity): 4 ng/L (ref <18)
Troponin I (High Sensitivity): 4 ng/L (ref <18)

## 2022-11-06 NOTE — ED Provider Notes (Signed)
[facility] Provider Note    Event Date/Time   First MD Initiated Contact with Patient 11/06/22 1613     (approximate)   History   Chest Pain   HPI  Andrea [name] is a 72 y.o. female who comes in with chest pain for about 1 week.  Patient is on Xanax for depression.  She denies any SI.  Patient reports for the past week having intermittent chest pain.  Patient reports he is intermittent stabbing sensations to her left chest wall.  She states that they come on at random times and they are NOT exertional in nature.  She denies any associated shortness of breath pleuritic chest pain risk factors for PE estrogen use etc.  She reports that when she first came in she was very anxious and that that is why her heart rate was elevated.  At this time she denies any shortness of breath.  She reports that the last episode of this was around 12:30 PM.  It lasted a few seconds and then went away.  She denies having any chest pain at this time.  She is completely asymptomatic and back to baseline.  She denies any rash  Physical Exam   Triage Vital Signs: ED Triage Vitals [11/06/22 1506]  Enc Vitals Group     BP (!) 165/88     Pulse Rate (!) 111     Resp 16     Temp 98.4 F (36.9 C)     Temp Source Oral     SpO2 95 %     Weight      Height      Head Circumference      Peak Flow      Pain Score 3     Pain Loc      Pain Edu?      Excl. in [place]?     Most recent vital signs: Vitals:   11/06/22 1506  BP: (!) 165/88  Pulse: (!) 111  Resp: 16  Temp: 98.4 F (36.9 C)  SpO2: 95%     General: Awake, no distress.  CV:  Good peripheral perfusion.  Resp:  Normal effort.  Abd:  No distention.  Other:  No chest wall tenderness.  Abdomen is soft and nontender.  Clear breath sounds. No swelling in legs.  No calf tenderness  ED Results / Procedures / Treatments   Labs (all labs ordered are listed, but only abnormal results are displayed) Labs Reviewed  BASIC  METABOLIC PANEL - Abnormal; Notable for the following components:      Result Value   Glucose, Bld 102 (*)    Calcium 8.8 (*)    All other components within normal limits  CBC  TROPONIN I (HIGH SENSITIVITY)     EKG  My interpretation of EKG:  Normal sinus rhythm 91 without any ST elevation or T wave inversions, normal intervals  RADIOLOGY I have reviewed the xray personally and interpreted and no evidence of any pneumonia   PROCEDURES:  Critical Care performed: No  Procedures   MEDICATIONS ORDERED IN ED: Medications - No data to display   IMPRESSION / MDM / [place] / ED COURSE  I reviewed the triage vital signs and the nursing notes.   Patient's presentation is most consistent with acute presentation with potential threat to life or bodily function.   Differential is ACS, PE, atypical chest pain, anxiety.  Will get 2 troponins given onset of chest pain was at  1230.  Patient was slightly tachycardic I discussed D-dimer with patient to rule out PE.  Patient declined given she is completely asymptomatic at this time reports tachycardia was from her anxiety earlier.  I think this is very reasonable given she has a complete resolution of symptoms, no longer tachycardic, my suspicion for PE is very low no evidence of DVT on examination.  We discussed return precautions in regards to this and she expressed understanding.  Troponin is negative.  BMP is normal CBC normal  Patient has a low heart score.  Considered admission but patient feels comfortable with outpatient follow-up.  She understands that I cannot predict future heart attacks and that if she has different pain returns that she would need to return to the emergency room for repeat evaluation  Repeat troponin is negative.  Patient denies any recurrent symptoms.  She has been here for over 3 hours.  She feels comfortable with discharge home.  She will follow-up outpatient for her slightly elevated blood  pressure        FINAL CLINICAL IMPRESSION(S) / ED DIAGNOSES   Final diagnoses:  Atypical chest pain     Rx / DC Orders   ED Discharge Orders          Ordered    Ambulatory referral to Cardiology        11/06/22 1740             Note:  This document was prepared using Dragon voice recognition software and may include unintentional dictation errors.   Vanessa Kilbourne, MD 11/06/22 [facility]

## 2022-11-06 NOTE — Discharge Instructions (Signed)
Your workup was reassuring without any evidence of heart attack however if you develop a change or worsening symptoms then please return to the ER for repeat evaluation.  Your blood pressure was slightly elevated and this can be followed up with your primary care doctor for a recheck  I placed a referral for cardiology for follow-up.

## 2022-11-06 NOTE — ED Triage Notes (Signed)
Pt c/o sharp intermittent, left sided chest pain x1 week. Pt denies jaw or back pain, SHOB, N/V, dizziness, or other s/s. Pt is Aox4, pleasant disposition and in NAD. Pt states she only takes xanax for depression.

## 2022-11-07 ENCOUNTER — Other Ambulatory Visit: Payer: Self-pay | Admitting: Psychiatry

## 2022-11-07 DIAGNOSIS — F339 Major depressive disorder, recurrent, unspecified: Secondary | ICD-10-CM

## 2022-11-13 ENCOUNTER — Telehealth: Payer: Self-pay | Admitting: Psychiatry

## 2022-11-13 NOTE — Telephone Encounter (Signed)
Pt called in around 1:04. Requesting a call back about med change. Knows Dr. Loletha Grayer returned back to day.

## 2022-11-15 ENCOUNTER — Telehealth: Payer: Self-pay | Admitting: Psychiatry

## 2022-11-15 NOTE — Telephone Encounter (Signed)
Pt lvm that she lvm on Monday and has not heard back from anyone. Please call her at [phone]

## 2022-11-15 NOTE — Telephone Encounter (Signed)
I cannot manage this over the phone.  Schedule her work in Friday at [employer]

## 2022-11-16 NOTE — Telephone Encounter (Signed)
Please see message. Patient has a conflict for tomorrow at 2:00, wants to know if 3:00 is okay.

## 2022-11-16 NOTE — Telephone Encounter (Signed)
She said she really appreciates you working with her to come in, but she has a conflict and wonders if you can see her at 3p tomorrow?  Let me know and I will call her back.

## 2022-11-16 NOTE — Telephone Encounter (Signed)
Please put patient on the schedule to see her tomorrow at 3:00, per his request.

## 2022-11-16 NOTE — Telephone Encounter (Signed)
3 pm is OK

## 2022-11-17 ENCOUNTER — Ambulatory Visit (INDEPENDENT_AMBULATORY_CARE_PROVIDER_SITE_OTHER): Payer: Medicare Other | Admitting: Psychiatry

## 2022-11-17 ENCOUNTER — Encounter: Payer: Self-pay | Admitting: Psychiatry

## 2022-11-17 DIAGNOSIS — F411 Generalized anxiety disorder: Secondary | ICD-10-CM | POA: Diagnosis not present

## 2022-11-17 DIAGNOSIS — F5105 Insomnia due to other mental disorder: Secondary | ICD-10-CM

## 2022-11-17 DIAGNOSIS — F339 Major depressive disorder, recurrent, unspecified: Secondary | ICD-10-CM | POA: Diagnosis not present

## 2022-11-17 MED ORDER — PRAMIPEXOLE DIHYDROCHLORIDE 0.5 MG PO TABS: 0.5000 mg | ORAL_TABLET | Freq: Two times a day (BID) | ORAL | 0 refills | Status: DC

## 2022-11-17 NOTE — Progress Notes (Signed)
JAZLENE HIGHMAN [id number] Jul 18, 1951 72 y.o.  Subjective:   Patient ID:  Andrea [name] is a 72 y.o. (DOB 10/14/1950) female.  Chief Complaint:  Chief Complaint  Patient presents with   Follow-up   Depression   Anxiety   Fatigue    HPI MURIAL BEDROSIAN presents to the office today for follow-up of MDE and anxiety disorder.  seen 09/2019.  No meds were changed.  Been on Lexapro 20 since early 2017.  02/27/2020 phone call from patient: Pt experiencing more anxiety than she normally has. Pt would like to know what are some options that she has as far as her medication. Pt will be in an appt  MD response: If the anxiety is just occasional then using alprazolam as needed would be an okay thing to do.  If it is been fairly persistent for a couple of weeks or more than the simplest solution would be to increase the Lexapro to 1-1/2 tablets daily.  Historically she has done well on Lexapro 20 mg daily for an extended period of time.  If she is under temporary stress then once that is resolved we could drop it back to 1 daily.  If this does not work as expected then she should schedule an earlier appointment. Pt response: Patient called back and she's been having temporary stress, but it's not bad. Things are great, she's working out and feeling okay for the most part. It's been going on for a bit so she agrees to the increase Lexapro 20 mg 1.5 tablets daily. She has been taking alprazolam at hs occasionally to help sleep better and it does help.   12/06/2020 appointment with the following noted:  No covid.  H Covid and recovered.   Had increased Lexapro to 30 mg for 90 days and saw a difference but started seeing weight gain and was doing oK and able to drop back and been OK. Still renovating a house for a year.  Sold her house and mourns the house. Calmed down.  Kept 3 gkids and dogs 10 days.   [place] for new house May 1. Rare alprazolam for HS. Stress moving to condo and would have crying spell over  the move and some problems she was running into dealing with it.  Hard with change.  But taking time to adjust to the idea of moving from her house of 23 years.  Initially refused but he left it up to her and she's come to peace about it.  [name]   Good response to medication and no SE.  Exercising is good medicine.  If doesn't then doesn't feel as good. Satisfied. Plan: no med changes  08/01/21  appt noted:  seen with [name] died 08/10/2023.  A lot of ups and downs per D.  Hard dips. Got worse when retired and dealing with the new  house.  Had trouble getting rid of things from the old house causing regrets over sellling the house.  D notes doesn't do well with instability  and stressors.  Ruminates on past mistakes. Blamed [name] for the  difficulty renovating the house.  Neg self talk and beats herself up. When not enough sleep will get depressed.  Does better if busy.   Dreads things.  Patient denies difficulty with sleep initiation or maintenance. Denies appetite disturbance.  Patient reports that energy and motivation have been good. Patient denies any difficulty with concentration.  Patient denies any suicidal ideation.  Plan: Abilify  2.5 mg daily for a week, then 5 mg daily. Continue Lexapro 20 mg daily  08/31/2021 appt noted: It worked immediately.  Insomnia with EMA. Normally 9 hours.  It's like I'm high.  Not tired.  Average 3-4 hours. Not depressed and no crazy negative thoughts.  Good response.  Energetic but up in middle of night. Plan: Okay to stop Abilify because of insomnia.   09/29/2021 appointment with the following noted: Parties were great.  Sleep problem resolved.  A lot of rest at the beach. Maybe the last week or 2 less motivation and socialization.  Not laying in the bed.  Just not as good as she was.  Not as outgoing as normal. No alcohol now.  Only had 1-2 at night.  Not ruminating.   Willing to restart Abilify at lower dose 2 mg daily. 3 grandsons  Plan:  Relapse depression recurs she can resume Abilify  but lower dose 2 mg daily or every other day to try to avoid the insomnia where she can contact our office and we can discussed the possibility of an alternative antidepressant such as Trintellix or duloxetine.  12/28/2021 appointment noted: Several phone calls since she was here.  Abilify plus Lexapro failed.  Switch to duloxetine 90 mg which she started 12/02/2018 2023 Started lamotrigine. Also started Rexulti Sister has had cycling depression that has responded well to lamotrigine with poor response to SSRIs [name] with a little dementia. SE tremor 90 mg duloxetine, ? Wt gain over a few weeks.. Exercise and wt control Social isolation, low self esteem, worry about the way she looks and not usually that way. Crying better with duloxetine.  More dependent on H.   Anhedonia.  Black hole.  Never this low. No SI Having to use Xanax.   Asks about day treatment and [place] Plan: rec TMS Continue lamotrigine as prescribed Increase Rexulti to 1 mg daily Switch to Trintellix:  reduce duloxetine to 2 of the 30 mg capsules and start Trintellix 5 mg daily for 5 days,  Then Increase Trintellix to 10 mg daily and reduce duloxetine to 1 capsule for 1 week. Then stop duloxetine.  02/15/22 appt noted: On Trintellix , Rexulti 1 and lamotrigine I feel good.  Walks 3 miles daily is great for mental health. [place] would not be covered by [facility]. Started feeling better after a week or 2 after the last visit. SE appetite increased. No nausea. Not crying anymore. Sleeping well and no longer ruminates. Plan: Continue Trintellix but DC Rexulti due to weight gain.  Continue lamotrigine  03/06/2022 appointment with the following noted: Last 2 weeks has been very sad and nervous. Trintellix was increased to 20 mg daily  03/10/2022 complaining of ongoing depression, feeling jittery, negative thinking, early morning awakening, ruminating about past decisions.  Wanting to consider  [place] because she is desperate for improvement. Plan we will schedule urgent appointment  03/15/22 urgent appt noted:  seen with [name] Very depressed, worst ever.  Don't want to be alone, ongoing worry, ruminating on past decisions.  Racing negative thoughts.  No motivation.  Trouble staying asleep.  Getting 4 hours and used to 9 hours. Dread getting up. Using Xanax Increased Trintellix to 20 mg 9 days ago. H agrees sleep is critical. Isolating.   Need to be better by July 11. Plan: Clonazepam 1 mg HS. Continue trintellix 20 mg daily she is only been on this dose 9 days and it needs more time. Re lamotrigine and increase to 150  mg daily.Arman Filter 1.5 mg every other day [facility] [place]  03/24/22 appt noted: So much better.  Thinking straighter and not negative and not sad.  Sleeping better 8-9 hours.. No crying. H notices progressively better every day.  Mind clarity is much better.   No SE noted.  No hangover.  No nausea. Found a therapist, Santiago Bumpers.   Feels well enough to go to [place] and kind of excited.  Back July 16.   Plan: Clonazepam 1 mg HS. This resolved insomnia without SE Continue trintellix 20 mg daily she is only been on this dose 20 days and it needs more time. Re lamotriginecontinue 150 mg daily.Arman Filter 1.5 mg every other day Option [place]  04/12/22 appt noted: Great.  Went to [place].  Was herself and enjoyed it.  Anxiety was managed.   [facility] and liked her. 3# wt gain.   Patient reports stable mood and denies depressed or irritable moods.  Patient denies any recent difficulty with anxiety.  Patient denies difficulty with sleep initiation or maintenance. Denies appetite disturbance.  Patient reports that energy and motivation have been good.  Patient denies any difficulty with concentration.  Patient denies any suicidal ideation. Sleeping well than not anxious. Plan: Trintellix 20  07/10/22 TC depression returned recently.  08/15/22  appt noted: Still blunted  and diminished motivation.  Some days better than others.  Going to gym.  Partially better.   Still has a lot of anxiety and doesn't want to be alone. Functioning but not normal.  Dreads parties.   SE tremor for a couple of week.sleep great.  Dep 7/10. Current Vraylar 1.5 mg daily, increase lamotrigine 200 mg daily, Trintellix 20 , clonazepam 0.5 mg HS Plan: Reduce Clonazepam 1/2  mg HS. This resolved insomnia without SE Continue trintellix 20 mg daily only mildly effective. lamotrigine continue 200 mg daily.[place] DT lost response Auvelity Stop Vraylar After Thanksgiving stop Trintellix Wait 3 days then [employer] 1 in the morning for 1 week,  and if no side effects then increase Auvelity to 1 in the AM and 1 in the PM  09/04/22 TC: Complaining of persistent depression and wanting to do something else to help.  Added lithium CR 300 mg nightly  09/11/2022 phone call complaining of no improvement with Auvelity and lithium.  Appointment was moved up.  09/13/22 appt noted: Nothing changed. No better and no worse.  Awakens sad and tearful.  Sleeps well. Pushing herself to the gym.  Has a trainer. On clonazepam 0.5 mg HS She remains anxious when alone for no apparent reason.  Feelings of inferiority.  She is normally extroverted but now she is wanting to isolate from people.  All tasks seem monumental.  No enjoyment and not looking forward to anything.  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  She remains generally worried and anxious which is not typical for her. Plan: Clonazepam 1/2  mg HS. This resolved insomnia without SE Stop Auvelity Start nortriptyline 1 of the 25 mg capsules at night for 4 nights then 2 at night for 4 nights then 3 at night, then wait 1 week and get the blood test in the morning at [facility] Reduce lamotrigine to 1 and 1/2 tablets daily  Lamotrigine has not been helpful to him 100 mg daily.  Start reducing lamotrigine 150 mg daily and will continue  to taper at next appointment.  09/27/21 urgent appt :  she wanted  urgent appt bc desperate to feel better.  Seen with H Called at least twice since here for the same reasons of depression. Not sleeping well even with clonazepam 1 mg HS. Doesn't want to be alone. Shakey.   On lithium 300 mg daily, nortriptyline 75 mg HS.   Doesn't want to live like this but not acutely suicidal. Tolerating meds. Plan: pursue [place] protocol [place] [facility] bc faster optin than alternatives  10/30/22 TC: finished 50 TMS tx in the week and wants appt ASAP bc still struggling with crying and depresssion..  11/03/22 urgent appt : Completed [place] last week. Tue-Thur better days and then Friday nose-dived. Not done well since got back.   H notes high anxiety and crying in AM and PM and doesn't want to be alone.  Xanxax seems to help. Xanax helps and taking 0.5 mg AM and 1 mg HS H notes memory is sharper and she's more alert after treatment. Very anxioius all the time.  Get up sad.  Exercising. Sleep good with Xanax 1 mg HS. Plan: Increase Lexapro 20 mg dailly (should help crying and maybe anxiety but unlikely to resolve depression) Increase alprazolam to 0.5 mg TID and 1 mg HS for severe anxiety. H notes it helps. Pramipexole 0.25 BID for 5 days and if NR then 0.5 mg Bid off label. Reduce lamotrigine 100 mg for 2 weeks then 50 mg daily for 2 weeks then stop it. She wants to pursue Spravato if pramipexole fails  11/17/22 emergency work in appt: Pramipexole was not sent in and MD not aware until yesterday.  She is not any better and is desperate for a med change. Sleeping well and taking Xanax 1 mg HS Mornings are worse.  Not crying much.  Last Friday was a bad day and she didn't want to be alone.  But did let her H play golf once this week. Reduced lamotrigine today to 50 BID and weaning off. H says better this week than last week. Increased Lexapro to 20 mg daily.  ECT-MADRS    [place] Visit from  11/03/2022 in [place]  MADRS Total Score 40      PHQ2-9    Flowsheet Row Clinical Support from 02/24/2022 in [place] at [facility] Video Visit from 06/14/2021 in [facility] at [place]  PHQ-2 Total Score 0 0  PHQ-9 Total Score -- 0      Flowsheet Row ED from 11/06/2022 in [facility] Emergency Department at [facility] ED from 08/11/2022 in [facility]  ED from 03/21/2022 in [facility] at [facility] No Risk No Risk No Risk      Past Psychiatric Medication Trials: Sertraline, fluoxetine, citalopram 50, nefazodone, Lexapro 30 weight gain,  Viibryd 30, Wellbutrin 300, Duloxetine 90 SE tremor, mirtazapine,  Trintellix 20 little response Auvelity twice daily for 3 weeks no response Nortriptyline 75 level 86 NR Auvelity BID  3 weeks NR  Lamotrigine 200 no response Abilify, Rexulti 0.5 Vraylar 1.5 daily tremor and lost response Lithium 300 shakes  methyl folate buspirone,  alprazolam, clonazepam, Under the care of [facility] since January 2001  Sister does well on lamotrigine.  Review of Systems:  Review of Systems  Constitutional:  Positive for fatigue. Negative for appetite change.  Cardiovascular:  Negative for palpitations.  Gastrointestinal:  Negative for nausea.  Neurological:  Negative for dizziness and light-headedness.  Psychiatric/Behavioral:  Positive for dysphoric mood. Negative for behavioral problems, confusion, decreased concentration, hallucinations, self-injury, sleep disturbance and suicidal ideas. The patient is nervous/anxious. The patient is not hyperactive.     Medications: I have reviewed the patient's current medications.  Current Outpatient Medications  Medication Sig Dispense Refill   ALPRAZolam (XANAX) 1 MG tablet 1-1 and 1/2 tablets for insomnia.  Stop clonazepam. (Patient taking  differently: 1/2 tablet 3 times daily as needed for anxiety and 1 tablet at night) 75 tablet 1   atorvastatin (LIPITOR) 20 MG tablet Take 20 mg by mouth.      calcium citrate-vitamin D (CITRACAL+D) 315-200 MG-UNIT tablet Take by mouth.     escitalopram (LEXAPRO) 20 MG tablet 1/2 tablet daily for 5 days then 1 tablet daily (Patient taking differently: Take 20 mg by mouth daily.) 30 tablet 1   fluticasone (FLONASE) 50 MCG/ACT nasal spray fluticasone propionate 50 mcg/actuation nasal spray,suspension     lamoTRIgine (LAMICTAL) 100 MG tablet Take 1 tablet (100 mg total) by mouth daily. (Patient taking differently: Take 50 mg by mouth daily. Started 50 mg BID today) 30 tablet 1   levothyroxine (SYNTHROID, LEVOTHROID) 50 MCG tablet Take 50 mcg by mouth daily before breakfast.     raloxifene (EVISTA) 60 MG tablet raloxifene 60 mg tablet     XIIDRA 5 % SOLN      pramipexole (MIRAPEX) 0.5 MG tablet Take 1 tablet (0.5 mg total) by mouth 2 (two) times daily. (Patient not taking: Reported on 11/17/2022) 60 tablet 0   No current facility-administered medications for this visit.    Medication Side Effects: None  Allergies:  Allergies  Allergen Reactions   Hydrocodone-Acetaminophen Other (See Comments)    Passes out   Morphine Other (See Comments)    Passes out    Past Medical History:  Diagnosis Date   GAD (generalized anxiety disorder)    Hyperlipidemia    Hypothyroidism    Osteopenia    Sinusitis     Family History  Problem Relation Age of Onset   Arthritis Mother    COPD Mother    Hypercholesterolemia Mother    Dementia Mother    Stroke Mother    Prostate cancer Brother    Cancer Maternal Grandmother        Oral    Social History   Socioeconomic History   Marital status: Married    Spouse name: Not on file   Number of children: Not on file   Years of education: Not on file   Highest education level: Not on file  Occupational History   Not on file  Tobacco Use   Smoking  status: Never   Smokeless tobacco: Never  Vaping Use   Vaping Use: Never used  Substance and Sexual Activity   Alcohol use: Not Currently    Comment: social   Drug use: Never   Sexual activity: Not on file  Other Topics Concern   Not on file  Social History Narrative   Married.   1 child. 2 grandchildren.   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   Enjoys exercising, spending time with family   Social Determinants of Health   Financial Resource Strain: Low Risk  (02/24/2022)   Overall Financial Resource Strain (CARDIA)    Difficulty of Paying Living Expenses: Not hard at all  Food Insecurity: No Food Insecurity (02/24/2022)   Hunger Vital Sign    Worried About Running Out of Food in the Last Year: Never true  Ran Out of Food in the Last Year: Never true  Transportation Needs: No Transportation Needs (02/24/2022)   PRAPARE - Hydrologist (Medical): No    Lack of Transportation (Non-Medical): No  Physical Activity: Sufficiently Active (02/24/2022)   Exercise Vital Sign    Days of Exercise per Week: 5 days    Minutes of Exercise per Session: 90 min  Stress: No Stress Concern Present (02/24/2022)   [place]    Feeling of Stress : Not at all  Social Connections: Not on file  Intimate Partner Violence: Not on file    Past Medical History, Surgical history, Social history, and Family history were reviewed and updated as appropriate.   3 grandsons.  Please see review of systems for further details on the patient's review from today.   Objective:   Physical Exam:  There were no vitals taken for this visit.  Physical Exam Constitutional:      General: She is not in acute distress.    Appearance: She is well-developed.  Musculoskeletal:        General: No deformity.  Neurological:     Mental Status: She is alert and oriented to person, place, and time.     Motor: No tremor.      Coordination: Coordination normal.     Gait: Gait normal.  Psychiatric:        Attention and Perception: She is attentive. She does not perceive auditory hallucinations.        Mood and Affect: Mood is anxious and depressed. Affect is not labile, blunt, angry, tearful or inappropriate.        Speech: Speech normal.        Behavior: Behavior normal. Behavior is not slowed.        Thought Content: Thought content normal. Thought content is not delusional. Thought content does not include homicidal or suicidal ideation. Thought content does not include suicidal plan.        Cognition and Memory: Cognition and memory normal.        Judgment: Judgment normal.     Comments: Insight intact. No auditory or visual hallucinations. No delusions.  Persistent severe depression and anxiety but perhaps a bit better than last week     Lab Review:     Component Value Date/Time   NA 136 11/06/2022 1511   K 3.8 11/06/2022 1511   CL 101 11/06/2022 1511   CO2 26 11/06/2022 1511   GLUCOSE 102 (H) 11/06/2022 1511   BUN 12 11/06/2022 1511   CREATININE 0.67 11/06/2022 1511   CALCIUM 8.8 (L) 11/06/2022 1511   GFRNONAA >60 11/06/2022 1511       Component Value Date/Time   WBC 8.0 11/06/2022 1511   RBC 4.06 11/06/2022 1511   HGB 13.0 11/06/2022 1511   HCT 38.4 11/06/2022 1511   PLT 323 11/06/2022 1511   MCV 94.6 11/06/2022 1511   MCH 32.0 11/06/2022 1511   MCHC 33.9 11/06/2022 1511   RDW 12.2 11/06/2022 1511    No results found for: "POCLITH", "LITHIUM"   No results found for: "PHENYTOIN", "PHENOBARB", "VALPROATE", "CBMZ"   .res Assessment: Plan:    Recurrent major depression resistant to treatment ([place]) - Plan: pramipexole (MIRAPEX) 0.5 MG tablet  Generalized anxiety disorder  Insomnia due to mental condition   Greater than 50% of 50-minute face to face time with patient was spent on counseling and coordination of care. Seen emergently with  pt and her H Recent worsening mood as noted  before.  Was better with intensive [place] for 3 days the crashed again.    She has failed multiple meds as noted above including all the usual categories of antidepressants with the exception of  MAO inhibitors.  Or pramipexole. Consider Latuda, Seroquel (not likely to be tolerated DT wt), Olanzapine Fluox Combo.  We discussed the short-term risks associated with benzodiazepines including sedation and increased fall risk among others.  Discussed long-term side effect risk including dependence, potential withdrawal symptoms, and the potential eventual dose-related risk of dementia.  But recent studies from 2020 dispute this association between benzodiazepines and dementia risk. Newer studies in 2020 do not support an association with dementia.  Consider Genesight  Clonazepam 1 mg HS. Insomnia worse with depression  Discussed Spravato at length again.  Describes side effects to her.  Described the process of administration and the commitment of time.  Describes a temporary benefit unless continued consistently.   Gave her a brochure to read.  Answered questions about this.  Disc ECT in detail.  Disc [place] vs Spravato decision point bc cannot do both.  Other option is pramipexole off label but probably lower rate of response but easier to start and use.   Increased Lexapro 20 mg dailly (helped crying and maybe anxiety but unlikely to resolve depression) Continue alprazolam to 0.5 mg TID prn anxiety and 1 mg HS. H notes it helps. Pramipexole 0.25 mg BID for 3 days then 0.5 mg BID Reduce lamotrigine 50 mg daily for 2 weeks then stop it. She wants to pursue Spravato if pramipexole fails  Rec counseling .  She has a scheduled appt.  FU 3 weeks  Lynder Parents, MD, DFAPA  Please see After Visit Summary for patient specific instructions.  Lynder Parents, MD, DFAPA   Future Appointments  Date Time Provider [place]  12/05/2022 10:30 AM Cottle, Billey Co., MD CP-CP None      No orders of  the defined types were placed in this encounter.      -------------------------------me

## 2022-11-20 DIAGNOSIS — L658 Other specified nonscarring hair loss: Secondary | ICD-10-CM | POA: Diagnosis not present

## 2022-11-20 DIAGNOSIS — L298 Other pruritus: Secondary | ICD-10-CM | POA: Diagnosis not present

## 2022-11-20 DIAGNOSIS — L538 Other specified erythematous conditions: Secondary | ICD-10-CM | POA: Diagnosis not present

## 2022-11-20 DIAGNOSIS — L820 Inflamed seborrheic keratosis: Secondary | ICD-10-CM | POA: Diagnosis not present

## 2022-11-22 DIAGNOSIS — J018 Other acute sinusitis: Secondary | ICD-10-CM | POA: Diagnosis not present

## 2022-11-22 DIAGNOSIS — R300 Dysuria: Secondary | ICD-10-CM | POA: Diagnosis not present

## 2022-11-22 DIAGNOSIS — Z20822 Contact with and (suspected) exposure to covid-19: Secondary | ICD-10-CM | POA: Diagnosis not present

## 2022-11-22 DIAGNOSIS — N3001 Acute cystitis with hematuria: Secondary | ICD-10-CM | POA: Diagnosis not present

## 2022-11-22 DIAGNOSIS — R030 Elevated blood-pressure reading, without diagnosis of hypertension: Secondary | ICD-10-CM | POA: Diagnosis not present

## 2022-11-22 DIAGNOSIS — R6883 Chills (without fever): Secondary | ICD-10-CM | POA: Diagnosis not present

## 2022-12-01 DIAGNOSIS — E039 Hypothyroidism, unspecified: Secondary | ICD-10-CM | POA: Diagnosis not present

## 2022-12-03 ENCOUNTER — Ambulatory Visit
Admission: RE | Admit: 2022-12-03 | Discharge: 2022-12-03 | Disposition: A | Discharge status: Home / Self Care | Payer: Medicare Other | Source: Ambulatory Visit | Attending: Emergency Medicine | Admitting: Emergency Medicine

## 2022-12-03 ENCOUNTER — Other Ambulatory Visit: Payer: Self-pay

## 2022-12-03 VITALS — BP 120/79 | HR 88 | Temp 97.9°F | Resp 16 | Ht 62.0 in | Wt 130.0 lb

## 2022-12-03 DIAGNOSIS — J0100 Acute maxillary sinusitis, unspecified: Secondary | ICD-10-CM | POA: Diagnosis not present

## 2022-12-03 DIAGNOSIS — H9203 Otalgia, bilateral: Secondary | ICD-10-CM | POA: Diagnosis not present

## 2022-12-03 MED ORDER — AMOXICILLIN-POT CLAVULANATE 875-125 MG PO TABS: 1.0000 | ORAL_TABLET | Freq: Two times a day (BID) | ORAL | 0 refills | Status: AC

## 2022-12-03 NOTE — Discharge Instructions (Addendum)
Take the Augmentin as directed.  Follow up with your primary care provider if your symptoms are not improving.    

## 2022-12-03 NOTE — ED Triage Notes (Signed)
Pt here for sinus congestion with green mucous when blowing nose.  Present X 2 weeks.  Also c/o ear pressure. No fevers. Pt is flying soon and concerned about sinus infection.
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
## 2022-12-04 ENCOUNTER — Other Ambulatory Visit: Payer: Self-pay | Admitting: Psychiatry

## 2022-12-04 DIAGNOSIS — F339 Major depressive disorder, recurrent, unspecified: Secondary | ICD-10-CM

## 2022-12-04 NOTE — Telephone Encounter (Signed)
Ask her if her mood is any better with pramipexole 0.5 mg BID.  If not better then increase to 1 and 1/2 tablets twice daily and we will send in the higher dose

## 2022-12-05 ENCOUNTER — Telehealth: Payer: Self-pay | Admitting: Psychiatry

## 2022-12-05 ENCOUNTER — Other Ambulatory Visit: Payer: Self-pay | Admitting: Psychiatry

## 2022-12-05 ENCOUNTER — Ambulatory Visit: Payer: Medicare Other | Admitting: Psychiatry

## 2022-12-05 DIAGNOSIS — F5105 Insomnia due to other mental disorder: Secondary | ICD-10-CM

## 2022-12-05 MED ORDER — ALPRAZOLAM 1 MG PO TABS: ORAL_TABLET | ORAL | 1 refills | Status: DC

## 2022-12-05 NOTE — Telephone Encounter (Signed)
Next appt 01/11/23. Andrea [name] is requesting a refill on her Xanax. Her brother passed away last night.She has 5 pills left. Pharmacy is:  [name] [place]  Phone: [phone] Fax: [phone]

## 2022-12-07 ENCOUNTER — Telehealth: Payer: Self-pay

## 2022-12-07 NOTE — Telephone Encounter (Signed)
Noted! Thank you

## 2022-12-07 NOTE — Telephone Encounter (Signed)
You had asked to F/U on how she was doing with the increased dose. Today was the first time taking the increased pramipexole dose of 0.75 mg BID. She said she is doing well. "I don't think I'm well, but I'm better."

## 2022-12-10 ENCOUNTER — Telehealth: Payer: Self-pay

## 2022-12-10 NOTE — Telephone Encounter (Signed)
Prior Authorization initiated for Alprazolam 1 mg #75/30  with [employer], PA# [id number] Start Date:11/10/2022;Coverage End Date:12/10/2023;

## 2022-12-11 ENCOUNTER — Other Ambulatory Visit: Payer: Self-pay | Admitting: Psychiatry

## 2022-12-11 DIAGNOSIS — F339 Major depressive disorder, recurrent, unspecified: Secondary | ICD-10-CM

## 2022-12-14 ENCOUNTER — Other Ambulatory Visit: Payer: Self-pay | Admitting: Obstetrics and Gynecology

## 2022-12-14 DIAGNOSIS — Z1231 Encounter for screening mammogram for malignant neoplasm of breast: Secondary | ICD-10-CM

## 2022-12-25 ENCOUNTER — Telehealth: Payer: Self-pay | Admitting: Psychiatry

## 2022-12-25 NOTE — Telephone Encounter (Signed)
Andrea [name] called at 10:30 to report that she is just not doing well.  Has appt 5/2 and is on the CXL.  But she wants to discuss how she feels and see if there are any medication options to help.  Please call.

## 2022-12-25 NOTE — Telephone Encounter (Signed)
Appt moved up apparently to 4/3.  Will discuss then

## 2022-12-25 NOTE — Telephone Encounter (Addendum)
Patient is reporting up and down moods. She isn't crying, but is sad all the time.  She is sleeping well. She is working out. She said she is negative and antisocial at times, but is feeling better when she gets up in the morning. She said she is planning a trip to [place] and has a function that she is planning. She said she doesn't know why she puts these things on herself.   She said she had tapered off the Lamictal and is only taking alprazolam, Lexapro 20, and pramipexole 0.75 mg BID.

## 2022-12-27 ENCOUNTER — Encounter: Payer: Self-pay | Admitting: Psychiatry

## 2022-12-27 ENCOUNTER — Telehealth: Payer: Self-pay | Admitting: Psychiatry

## 2022-12-27 ENCOUNTER — Ambulatory Visit (INDEPENDENT_AMBULATORY_CARE_PROVIDER_SITE_OTHER): Payer: Medicare Other | Admitting: Psychiatry

## 2022-12-27 DIAGNOSIS — F5105 Insomnia due to other mental disorder: Secondary | ICD-10-CM

## 2022-12-27 DIAGNOSIS — F339 Major depressive disorder, recurrent, unspecified: Secondary | ICD-10-CM

## 2022-12-27 DIAGNOSIS — F411 Generalized anxiety disorder: Secondary | ICD-10-CM | POA: Diagnosis not present

## 2022-12-27 MED ORDER — PRAMIPEXOLE DIHYDROCHLORIDE 0.5 MG PO TABS: ORAL_TABLET | ORAL | 0 refills | Status: DC

## 2022-12-27 NOTE — Progress Notes (Signed)
Andrea [name] [id number] 07/27/1951 72 y.o.  Subjective:   Patient ID:  Andrea [name] is a 72 y.o. (DOB 10/01/1950) female.  Chief Complaint:  Chief Complaint  Patient presents with   Follow-up   Depression   Anxiety   Sleeping Problem    HPI Andrea [name] presents to the office today for follow-up of MDE and anxiety disorder.  seen 09/2019.  No meds were changed.  Been on Lexapro 20 since early 2017.  02/27/2020 phone call from patient: Pt experiencing more anxiety than she normally has. Pt would like to know what are some options that she has as far as her medication. Pt will be in an appt  MD response: If the anxiety is just occasional then using alprazolam as needed would be an okay thing to do.  If it is been fairly persistent for a couple of weeks or more than the simplest solution would be to increase the Lexapro to 1-1/2 tablets daily.  Historically she has done well on Lexapro 20 mg daily for an extended period of time.  If she is under temporary stress then once that is resolved we could drop it back to 1 daily.  If this does not work as expected then she should schedule an earlier appointment. Pt response: Patient called back and she's been having temporary stress, but it's not bad. Things are great, she's working out and feeling okay for the most part. It's been going on for a bit so she agrees to the increase Lexapro 20 mg 1.5 tablets daily. She has been taking alprazolam at hs occasionally to help sleep better and it does help.   12/06/2020 appointment with the following noted:  No covid.  H Covid and recovered.   Had increased Lexapro to 30 mg for 90 days and saw a difference but started seeing weight gain and was doing oK and able to drop back and been OK. Still renovating a house for a year.  Sold her house and mourns the house. Calmed down.  Kept 3 gkids and dogs 10 days.   [place] for new house May 1. Rare alprazolam for HS. Stress moving to condo and would have crying  spell over the move and some problems she was running into dealing with it.  Hard with change.  But taking time to adjust to the idea of moving from her house of 23 years.  Initially refused but he left it up to her and she's come to peace about it.  Andrea [name].   Good response to medication and no SE.  Exercising is good medicine.  If doesn't then doesn't feel as good. Satisfied. Plan: no med changes  08/01/21  appt noted:  seen with [name] died August 08, 2023.  A lot of ups and downs per D.  Hard dips. Got worse when retired and dealing with the new  house.  Had trouble getting rid of things from the old house causing regrets over sellling the house.  D notes doesn't do well with instability  and stressors.  Ruminates on past mistakes. Blamed Andrea [name] for the  difficulty renovating the house.  Neg self talk and beats herself up. When not enough sleep will get depressed.  Does better if busy.   Dreads things.  Patient denies difficulty with sleep initiation or maintenance. Denies appetite disturbance.  Patient reports that energy and motivation have been good. Patient denies any difficulty with concentration.  Patient denies any suicidal ideation.  Plan:  Abilify 2.5 mg daily for a week, then 5 mg daily. Continue Lexapro 20 mg daily  08/31/2021 appt noted: It worked immediately.  Insomnia with EMA. Normally 9 hours.  It's like I'm high.  Not tired.  Average 3-4 hours. Not depressed and no crazy negative thoughts.  Good response.  Energetic but up in middle of night. Plan: Okay to stop Abilify because of insomnia.   09/29/2021 appointment with the following noted: Parties were great.  Sleep problem resolved.  A lot of rest at the beach. Maybe the last week or 2 less motivation and socialization.  Not laying in the bed.  Just not as good as she was.  Not as outgoing as normal. No alcohol now.  Only had 1-2 at night.  Not ruminating.   Willing to restart Abilify at lower dose 2 mg daily. 3 grandsons   Plan: Relapse depression recurs she can resume Abilify  but lower dose 2 mg daily or every other day to try to avoid the insomnia where she can contact our office and we can discussed the possibility of an alternative antidepressant such as Trintellix or duloxetine.  12/28/2021 appointment noted: Several phone calls since she was here.  Abilify plus Lexapro failed.  Switch to duloxetine 90 mg which she started 12/02/2018 2023 Started lamotrigine. Also started Rexulti Sister has had cycling depression that has responded well to lamotrigine with poor response to SSRIs [name] with a little dementia. SE tremor 90 mg duloxetine, ? Wt gain over a few weeks.. Exercise and wt control Social isolation, low self esteem, worry about the way she looks and not usually that way. Crying better with duloxetine.  More dependent on H.   Anhedonia.  Black hole.  Never this low. No SI Having to use Xanax.   Asks about day treatment and [place] Plan: rec TMS Continue lamotrigine as prescribed Increase Rexulti to 1 mg daily Switch to Trintellix:  reduce duloxetine to 2 of the 30 mg capsules and start Trintellix 5 mg daily for 5 days,  Then Increase Trintellix to 10 mg daily and reduce duloxetine to 1 capsule for 1 week. Then stop duloxetine.  02/15/22 appt noted: On Trintellix , Rexulti 1 and lamotrigine I feel good.  Walks 3 miles daily is great for mental health. [employer] would not be covered by [facility]. Started feeling better after a week or 2 after the last visit. SE appetite increased. No nausea. Not crying anymore. Sleeping well and no longer ruminates. Plan: Continue Trintellix but DC Rexulti due to weight gain.  Continue lamotrigine  03/06/2022 appointment with the following noted: Last 2 weeks has been very sad and nervous. Trintellix was increased to 20 mg daily  03/10/2022 complaining of ongoing depression, feeling jittery, negative thinking, early morning awakening, ruminating about past decisions.  Wanting to  consider [place] because she is desperate for improvement. Plan we will schedule urgent appointment  03/15/22 urgent appt noted:  seen with Andrea [name] Very depressed, worst ever.  Don't want to be alone, ongoing worry, ruminating on past decisions.  Racing negative thoughts.  No motivation.  Trouble staying asleep.  Getting 4 hours and used to 9 hours. Dread getting up. Using Xanax Increased Trintellix to 20 mg 9 days ago. H agrees sleep is critical. Isolating.   Need to be better by July 11. Plan: Clonazepam 1 mg HS. Continue trintellix 20 mg daily she is only been on this dose 9 days and it needs more time. Re lamotrigine and increase to  150 mg daily.Arman Filter 1.5 mg every other day Option [place]  03/24/22 appt noted: So much better.  Thinking straighter and not negative and not sad.  Sleeping better 8-9 hours.. No crying. H notices progressively better every day.  Mind clarity is much better.   No SE noted.  No hangover.  No nausea. Found a therapist, Santiago Bumpers.   Feels well enough to go to [place] and kind of excited.  Back July 16.   Plan: Clonazepam 1 mg HS. This resolved insomnia without SE Continue trintellix 20 mg daily she is only been on this dose 20 days and it needs more time. Re lamotriginecontinue 150 mg daily.Arman Filter 1.5 mg every other day Option [place]  04/12/22 appt noted: Great.  Went to [place].  Was herself and enjoyed it.  Anxiety was managed.   [facility] and liked her. 3# wt gain.   Patient reports stable mood and denies depressed or irritable moods.  Patient denies any recent difficulty with anxiety.  Patient denies difficulty with sleep initiation or maintenance. Denies appetite disturbance.  Patient reports that energy and motivation have been good.  Patient denies any difficulty with concentration.  Patient denies any suicidal ideation. Sleeping well than not anxious. Plan: Trintellix 20  07/10/22 TC depression returned  recently.  08/15/22 appt noted: Still blunted  and diminished motivation.  Some days better than others.  Going to gym.  Partially better.   Still has a lot of anxiety and doesn't want to be alone. Functioning but not normal.  Dreads parties.   SE tremor for a couple of week.sleep great.  Dep 7/10. Current Vraylar 1.5 mg daily, increase lamotrigine 200 mg daily, Trintellix 20 , clonazepam 0.5 mg HS Plan: Reduce Clonazepam 1/2  mg HS. This resolved insomnia without SE Continue trintellix 20 mg daily only mildly effective. lamotrigine continue 200 mg daily.[place] DT lost response Auvelity Stop Vraylar After Thanksgiving stop Trintellix Wait 3 days then [employer] 1 in the morning for 1 week,  and if no side effects then increase Auvelity to 1 in the AM and 1 in the PM  09/04/22 TC: Complaining of persistent depression and wanting to do something else to help.  Added lithium CR 300 mg nightly  09/11/2022 phone call complaining of no improvement with Auvelity and lithium.  Appointment was moved up.  09/13/22 appt noted: Nothing changed. No better and no worse.  Awakens sad and tearful.  Sleeps well. Pushing herself to the gym.  Has a trainer. On clonazepam 0.5 mg HS She remains anxious when alone for no apparent reason.  Feelings of inferiority.  She is normally extroverted but now she is wanting to isolate from people.  All tasks seem monumental.  No enjoyment and not looking forward to anything.  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  She remains generally worried and anxious which is not typical for her. Plan: Clonazepam 1/2  mg HS. This resolved insomnia without SE Stop Auvelity Start nortriptyline 1 of the 25 mg capsules at night for 4 nights then 2 at night for 4 nights then 3 at night, then wait 1 week and get the blood test in the morning at [facility] Reduce lamotrigine to 1 and 1/2 tablets daily  Lamotrigine has not been helpful to him 100 mg daily.  Start reducing lamotrigine 150 mg  daily and will continue to taper at next appointment.  09/27/21 urgent appt :  she  wanted urgent appt bc desperate to feel better.  Seen with H Called at least twice since here for the same reasons of depression. Not sleeping well even with clonazepam 1 mg HS. Doesn't want to be alone. Shakey.   On lithium 300 mg daily, nortriptyline 75 mg HS.   Doesn't want to live like this but not acutely suicidal. Tolerating meds. Plan: pursue [place] protocol [place] at [facility] bc faster optin than alternatives  10/30/22 TC: finished 50 TMS tx in the week and wants appt ASAP bc still struggling with crying and depresssion..  11/03/22 urgent appt : Completed [place] last week. Tue-Thur better days and then Friday nose-dived. Not done well since got back.   H notes high anxiety and crying in AM and PM and doesn't want to be alone.  Xanxax seems to help. Xanax helps and taking 0.5 mg AM and 1 mg HS H notes memory is sharper and she's more alert after treatment. Very anxioius all the time.  Get up sad.  Exercising. Sleep good with Xanax 1 mg HS. Plan: Increase Lexapro 20 mg dailly (should help crying and maybe anxiety but unlikely to resolve depression) Increase alprazolam to 0.5 mg TID and 1 mg HS for severe anxiety. H notes it helps. Pramipexole 0.25 BID for 5 days and if NR then 0.5 mg Bid off label. Reduce lamotrigine 100 mg for 2 weeks then 50 mg daily for 2 weeks then stop it. She wants to pursue Spravato if pramipexole fails  11/17/22 emergency work in appt: Pramipexole was not sent in and MD not aware until yesterday.  She is not any better and is desperate for a med change. Sleeping well and taking Xanax 1 mg HS Mornings are worse.  Not crying much.  Last Friday was a bad day and she didn't want to be alone.  But did let her H play golf once this week. Reduced lamotrigine today to 50 BID and weaning off. H says better this week than last week. Increased Lexapro to 20 mg daily. Plan: Increased Lexapro 20  mg dailly (helped crying and maybe anxiety but unlikely to resolve depression) Continue alprazolam to 0.5 mg TID prn anxiety and 1 mg HS. H notes it helps. Pramipexole 0.25 mg BID for 3 days then 0.5 mg BID Reduce lamotrigine 50 mg daily for 2 weeks then stop it. She wants to pursue Spravato if pramipexole fails  12/25/22 appt urgently. COMPLETED 1 WEEK [place] without benefit except TMS took away brain fog.   She feels need to take Xanax 0.5 mg TID  Some sleepiness with Xanax . Mornings are better and fine while at the gym but when home gets depressed again.   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 Needs Xanax to do social things.  ECT-MADRS    [place] Visit from 11/03/2022 in [place]  MADRS Total Score 40      PHQ2-9    Flowsheet Row Clinical Support from 02/24/2022 in [place] at [facility] Video Visit from 06/14/2021 in [place] at [place]  PHQ-2 Total Score 0 0  PHQ-9 Total Score -- 0      Flowsheet Row ED from 12/03/2022 in [facility] at [facility]  ED from 11/06/2022 in [facility] Emergency Department at [facility] ED from 08/11/2022 in [facility] at [place] No Risk No Risk No Risk      Past  Psychiatric Medication Trials: Sertraline, fluoxetine, citalopram 50, nefazodone, Lexapro 30 weight gain,  Viibryd 30, Wellbutrin 300, Duloxetine 90 SE tremor, mirtazapine,  Trintellix 20 little response Auvelity twice daily for 3 weeks no response Nortriptyline 75 level 86 NR Auvelity BID  3 weeks NR  Lamotrigine 200 no response Abilify, Rexulti 0.5 Vraylar 1.5 daily tremor and lost response Lithium 300 shakes  methyl folate buspirone,  alprazolam, clonazepam, Under the care of [facility] since January 2001  Sister does well on lamotrigine.  Review of Systems:  Review of Systems  Constitutional:   Positive for fatigue. Negative for appetite change.  Cardiovascular:  Negative for palpitations.  Gastrointestinal:  Negative for nausea.  Neurological:  Negative for dizziness and light-headedness.  Psychiatric/Behavioral:  Positive for dysphoric mood. Negative for behavioral problems, confusion, decreased concentration, hallucinations, self-injury, sleep disturbance and suicidal ideas. The patient is nervous/anxious. The patient is not hyperactive.     Medications: I have reviewed the patient's current medications.  Current Outpatient Medications  Medication Sig Dispense Refill   ALPRAZolam (XANAX) 1 MG tablet 1/2 tablet 3 times daily as needed for anxiety and 1 tablet at night (Patient taking differently: Taking 1/2 tablet twice daily and 1 and 1/2 tablets at night) 75 tablet 1   atorvastatin (LIPITOR) 20 MG tablet Take 20 mg by mouth.      calcium citrate-vitamin D (CITRACAL+D) 315-200 MG-UNIT tablet Take by mouth.     escitalopram (LEXAPRO) 20 MG tablet Take 1 tablet (20 mg total) by mouth daily. 30 tablet 1   fluticasone (FLONASE) 50 MCG/ACT nasal spray fluticasone propionate 50 mcg/actuation nasal spray,suspension     levothyroxine (SYNTHROID, LEVOTHROID) 50 MCG tablet Take 50 mcg by mouth daily before breakfast.     raloxifene (EVISTA) 60 MG tablet raloxifene 60 mg tablet     XIIDRA 5 % SOLN      pramipexole (MIRAPEX) 0.5 MG tablet 1 and 1/2 tablets in the AM and 6 PM and 1 tablet at noon and bedtime 180 tablet 0   No current facility-administered medications for this visit.    Medication Side Effects: None  Allergies:  Allergies  Allergen Reactions   Hydrocodone-Acetaminophen Other (See Comments)    Passes out   Morphine Other (See Comments)    Passes out    Past Medical History:  Diagnosis Date   GAD (generalized anxiety disorder)    Hyperlipidemia    Hypothyroidism    Osteopenia    Sinusitis     Family History  Problem Relation Age of Onset   Arthritis Mother     COPD Mother    Hypercholesterolemia Mother    Dementia Mother    Stroke Mother    Prostate cancer Brother    Cancer Maternal Grandmother        Oral    Social History   Socioeconomic History   Marital status: Married    Spouse name: Not on file   Number of children: Not on file   Years of education: Not on file   Highest education level: Not on file  Occupational History   Not on file  Tobacco Use   Smoking status: Never   Smokeless tobacco: Never  Vaping Use   Vaping Use: Never used  Substance and Sexual Activity   Alcohol use: Not Currently    Comment: social   Drug use: Never   Sexual activity: Not on file  Other Topics Concern   Not on file  Social History Narrative   Married.  1 child. 2 grandchildren.   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   Enjoys exercising, spending time with family   Social Determinants of Health   Financial Resource Strain: Low Risk  (02/24/2022)   Overall Financial Resource Strain (CARDIA)    Difficulty of Paying Living Expenses: Not hard at all  Food Insecurity: No Food Insecurity (02/24/2022)   Hunger Vital Sign    Worried About Running Out of Food in the Last Year: Never true    Ran Out of Food in the Last Year: Never true  Transportation Needs: No Transportation Needs (02/24/2022)   PRAPARE - Hydrologist (Medical): No    Lack of Transportation (Non-Medical): No  Physical Activity: Sufficiently Active (02/24/2022)   Exercise Vital Sign    Days of Exercise per Week: 5 days    Minutes of Exercise per Session: 90 min  Stress: No Stress Concern Present (02/24/2022)   [place]    Feeling of Stress : Not at all  Social Connections: Not on file  Intimate Partner Violence: Not on file    Past Medical History, Surgical history, Social history, and Family history were reviewed and updated as appropriate.   3 grandsons.  Please see review  of systems for further details on the patient's review from today.   Objective:   Physical Exam:  There were no vitals taken for this visit.  Physical Exam Constitutional:      General: She is not in acute distress.    Appearance: She is well-developed.  Musculoskeletal:        General: No deformity.  Neurological:     Mental Status: She is alert and oriented to person, place, and time.     Motor: No tremor.     Coordination: Coordination normal.     Gait: Gait normal.  Psychiatric:        Attention and Perception: She is attentive. She does not perceive auditory hallucinations.        Mood and Affect: Mood is anxious and depressed. Affect is not labile, blunt, tearful or inappropriate.        Speech: Speech normal.        Behavior: Behavior normal. Behavior is not slowed.        Thought Content: Thought content normal. Thought content is not delusional. Thought content does not include homicidal or suicidal ideation. Thought content does not include suicidal plan.        Cognition and Memory: Cognition and memory normal.        Judgment: Judgment normal.     Comments: Insight intact. No auditory or visual hallucinations. No delusions.  Persistent severe depression and anxiety but perhaps a bit better than last week     Lab Review:     Component Value Date/Time   NA 136 11/06/2022 1511   K 3.8 11/06/2022 1511   CL 101 11/06/2022 1511   CO2 26 11/06/2022 1511   GLUCOSE 102 (H) 11/06/2022 1511   BUN 12 11/06/2022 1511   CREATININE 0.67 11/06/2022 1511   CALCIUM 8.8 (L) 11/06/2022 1511   GFRNONAA >60 11/06/2022 1511       Component Value Date/Time   WBC 8.0 11/06/2022 1511   RBC 4.06 11/06/2022 1511   HGB 13.0 11/06/2022 1511   HCT 38.4 11/06/2022 1511   PLT 323 11/06/2022 1511   MCV 94.6 11/06/2022 1511   MCH 32.0 11/06/2022 1511   MCHC  33.9 11/06/2022 1511   RDW 12.2 11/06/2022 1511    No results found for: "POCLITH", "LITHIUM"   No results found for:  "PHENYTOIN", "PHENOBARB", "VALPROATE", "CBMZ"   .res Assessment: Plan:    Recurrent major depression resistant to treatment - Plan: pramipexole (MIRAPEX) 0.5 MG tablet  Generalized anxiety disorder  Insomnia due to mental condition   Greater than 50% of 50-minute face to face time with patient was spent on counseling and coordination of care. Seen emergently with pt and her H Recent worsening mood as noted before.  Was better with intensive [place] for 3 days the crashed again.    She has failed multiple meds as noted above including all the usual categories of antidepressants with the exception of  MAO inhibitors.  Or pramipexole. Consider Latuda, Seroquel (not likely to be tolerated DT wt), Olanzapine Fluox Combo.  We discussed the short-term risks associated with benzodiazepines including sedation and increased fall risk among others.  Discussed long-term side effect risk including dependence, potential withdrawal symptoms, and the potential eventual dose-related risk of dementia.  But recent studies from 2020 dispute this association between benzodiazepines and dementia risk. Newer studies in 2020 do not support an association with dementia.  Consider Genesight  Discussed Spravato at length again.  Describes side effects to her.  Described the process of administration and the commitment of time.  Describes a temporary benefit unless continued consistently.   Gave her a brochure to read.  Answered questions about this.  Disc ECT in detail.  Disc [place] vs Spravato decision point bc cannot do both.  Other option is pramipexole off label but probably lower rate of response but easier to start and use.   Increased Lexapro 20 mg dailly (helped crying and maybe anxiety but unlikely to resolve depression) Continue alprazolam to 0.5 mg TID prn anxiety and 1 mg HS. H notes it helps. Change pramipexole 0.5mg  tablets, 1 tablet four times daily for 1 week,  Then if not better 1and 1/2 tablets in the AM  and dinner and 1 tablet at lunch and dinner.    Disc off label use pramipexole and the dosage range 1-5 mg daily and risk higher dose including manic type sx, impulsivity.  Rec counseling .  She has a scheduled appt.  FU 2 weeks  Lynder Parents, MD, DFAPA  Please see After Visit Summary for patient specific instructions.  Lynder Parents, MD, DFAPA   Future Appointments  Date Time Provider [place]  01/25/2023  1:30 PM Cottle, Billey Co., MD CP-CP None      No orders of the defined types were placed in this encounter.      -------------------------------me

## 2022-12-27 NOTE — Telephone Encounter (Signed)
Pt was here an hour ago for appt with Dr Clovis Pu.  She went to pick up the Pramipexole.  She was told it couldn't be filled until 4/9.  [id number] [name], [place] [address] [place] [address] Phone: [phone]  Fax: [phone]    Next appt 4/19

## 2022-12-27 NOTE — Patient Instructions (Signed)
Change pramipexole 0.5mg  tablets, 1 tablet four times daily for 1 week,  Then if not better 1and 1/2 tablets in the AM and dinner and 1 tablet at lunch and dinner.

## 2022-12-28 NOTE — Telephone Encounter (Signed)
Patient should have enough from previous Rx to cover her until RF due. LVM with this info.

## 2023-01-02 ENCOUNTER — Observation Stay: Payer: Medicare Other | Admitting: Anesthesiology

## 2023-01-02 ENCOUNTER — Encounter
Admission: EM | Disposition: A | Discharge status: Home / Self Care | Payer: Self-pay | Source: Home / Self Care | Attending: Surgery

## 2023-01-02 ENCOUNTER — Emergency Department: Payer: Medicare Other

## 2023-01-02 ENCOUNTER — Encounter: Payer: Self-pay | Admitting: Emergency Medicine

## 2023-01-02 ENCOUNTER — Other Ambulatory Visit: Payer: Self-pay

## 2023-01-02 ENCOUNTER — Inpatient Hospital Stay
Admission: EM | Admit: 2023-01-02 | Discharge: 2023-01-07 | DRG: 330 | Disposition: A | Discharge status: Home / Self Care | Payer: Medicare Other | Attending: Surgery | Admitting: Surgery

## 2023-01-02 ENCOUNTER — Other Ambulatory Visit: Payer: Self-pay | Admitting: Psychiatry

## 2023-01-02 DIAGNOSIS — J90 Pleural effusion, not elsewhere classified: Secondary | ICD-10-CM | POA: Diagnosis not present

## 2023-01-02 DIAGNOSIS — Z83438 Family history of other disorder of lipoprotein metabolism and other lipidemia: Secondary | ICD-10-CM

## 2023-01-02 DIAGNOSIS — K567 Ileus, unspecified: Secondary | ICD-10-CM | POA: Diagnosis not present

## 2023-01-02 DIAGNOSIS — K3580 Unspecified acute appendicitis: Secondary | ICD-10-CM

## 2023-01-02 DIAGNOSIS — E871 Hypo-osmolality and hyponatremia: Secondary | ICD-10-CM | POA: Diagnosis present

## 2023-01-02 DIAGNOSIS — E039 Hypothyroidism, unspecified: Secondary | ICD-10-CM | POA: Diagnosis present

## 2023-01-02 DIAGNOSIS — K3532 Acute appendicitis with perforation and localized peritonitis, without abscess: Principal | ICD-10-CM

## 2023-01-02 DIAGNOSIS — Z820 Family history of epilepsy and other diseases of the nervous system: Secondary | ICD-10-CM | POA: Diagnosis not present

## 2023-01-02 DIAGNOSIS — E785 Hyperlipidemia, unspecified: Secondary | ICD-10-CM | POA: Diagnosis present

## 2023-01-02 DIAGNOSIS — Z808 Family history of malignant neoplasm of other organs or systems: Secondary | ICD-10-CM | POA: Diagnosis not present

## 2023-01-02 DIAGNOSIS — I700 Atherosclerosis of aorta: Secondary | ICD-10-CM | POA: Diagnosis not present

## 2023-01-02 DIAGNOSIS — R109 Unspecified abdominal pain: Secondary | ICD-10-CM | POA: Diagnosis not present

## 2023-01-02 DIAGNOSIS — Z79899 Other long term (current) drug therapy: Secondary | ICD-10-CM

## 2023-01-02 DIAGNOSIS — Z79890 Hormone replacement therapy (postmenopausal): Secondary | ICD-10-CM | POA: Diagnosis not present

## 2023-01-02 DIAGNOSIS — Z823 Family history of stroke: Secondary | ICD-10-CM | POA: Diagnosis not present

## 2023-01-02 DIAGNOSIS — J9811 Atelectasis: Secondary | ICD-10-CM | POA: Diagnosis not present

## 2023-01-02 DIAGNOSIS — F411 Generalized anxiety disorder: Secondary | ICD-10-CM | POA: Diagnosis present

## 2023-01-02 DIAGNOSIS — Z825 Family history of asthma and other chronic lower respiratory diseases: Secondary | ICD-10-CM

## 2023-01-02 DIAGNOSIS — R1032 Left lower quadrant pain: Secondary | ICD-10-CM | POA: Diagnosis not present

## 2023-01-02 DIAGNOSIS — Z8261 Family history of arthritis: Secondary | ICD-10-CM | POA: Diagnosis not present

## 2023-01-02 DIAGNOSIS — K37 Unspecified appendicitis: Secondary | ICD-10-CM | POA: Diagnosis present

## 2023-01-02 DIAGNOSIS — Z885 Allergy status to narcotic agent status: Secondary | ICD-10-CM

## 2023-01-02 DIAGNOSIS — F339 Major depressive disorder, recurrent, unspecified: Secondary | ICD-10-CM

## 2023-01-02 HISTORY — DX: Unspecified acute appendicitis: K35.80

## 2023-01-02 HISTORY — PX: LAPAROSCOPIC APPENDECTOMY: SHX408

## 2023-01-02 LAB — CBC WITH DIFFERENTIAL/PLATELET
Abs Immature Granulocytes: 0.07 10*3/uL (ref 0.00–0.07)
Basophils Absolute: 0 10*3/uL (ref 0.0–0.1)
Basophils Relative: 0 %
Eosinophils Absolute: 0.1 10*3/uL (ref 0.0–0.5)
Eosinophils Relative: 1 %
HCT: 36.6 % (ref 36.0–46.0)
Hemoglobin: 12.1 g/dL (ref 12.0–15.0)
Immature Granulocytes: 1 %
Lymphocytes Relative: 13 %
Lymphs Abs: 1.6 10*3/uL (ref 0.7–4.0)
MCH: 31.8 pg (ref 26.0–34.0)
MCHC: 33.1 g/dL (ref 30.0–36.0)
MCV: 96.1 fL (ref 80.0–100.0)
Monocytes Absolute: 0.8 10*3/uL (ref 0.1–1.0)
Monocytes Relative: 6 %
Neutro Abs: 9.6 10*3/uL — ABNORMAL HIGH (ref 1.7–7.7)
Neutrophils Relative %: 79 %
Platelets: 257 10*3/uL (ref 150–400)
RBC: 3.81 MIL/uL — ABNORMAL LOW (ref 3.87–5.11)
RDW: 11.8 % (ref 11.5–15.5)
WBC: 12.1 10*3/uL — ABNORMAL HIGH (ref 4.0–10.5)
nRBC: 0 % (ref 0.0–0.2)

## 2023-01-02 LAB — COMPREHENSIVE METABOLIC PANEL
ALT: 27 U/L (ref 0–44)
AST: 37 U/L (ref 15–41)
Albumin: 3.6 g/dL (ref 3.5–5.0)
Alkaline Phosphatase: 33 U/L — ABNORMAL LOW (ref 38–126)
Anion gap: 5 (ref 5–15)
BUN: 10 mg/dL (ref 8–23)
CO2: 25 mmol/L (ref 22–32)
Calcium: 8.1 mg/dL — ABNORMAL LOW (ref 8.9–10.3)
Chloride: 101 mmol/L (ref 98–111)
Creatinine, Ser: 0.64 mg/dL (ref 0.44–1.00)
GFR, Estimated: >60 mL/min (ref >60)
Glucose, Bld: 123 mg/dL — ABNORMAL HIGH (ref 70–99)
Potassium: 3.5 mmol/L (ref 3.5–5.1)
Sodium: 131 mmol/L — ABNORMAL LOW (ref 135–145)
Total Bilirubin: 0.8 mg/dL (ref 0.3–1.2)
Total Protein: 6.3 g/dL — ABNORMAL LOW (ref 6.5–8.1)

## 2023-01-02 SURGERY — APPENDECTOMY, LAPAROSCOPIC
Anesthesia: General

## 2023-01-02 MED ORDER — KETOROLAC TROMETHAMINE 30 MG/ML IJ SOLN
30.0000 mg | Freq: Once | INTRAMUSCULAR | Status: AC
  Administered 2023-01-02: 30 mg via INTRAVENOUS
  Filled 2023-01-02: qty 1

## 2023-01-02 MED ORDER — DIPHENHYDRAMINE HCL 12.5 MG/5ML PO ELIX: 12.5000 mg | ORAL_SOLUTION | Freq: Four times a day (QID) | ORAL | Status: DC | PRN

## 2023-01-02 MED ORDER — MELATONIN 5 MG PO TABS
2.5000 mg | ORAL_TABLET | Freq: Every evening | ORAL | Status: DC | PRN
  Administered 2023-01-03 – 2023-01-06 (×2): 2.5 mg via ORAL
  Filled 2023-01-02 (×3): qty 1

## 2023-01-02 MED ORDER — DEXAMETHASONE SODIUM PHOSPHATE 10 MG/ML IJ SOLN
INTRAMUSCULAR | Status: DC | PRN
  Administered 2023-01-02 (×2): 10 mg via INTRAVENOUS

## 2023-01-02 MED ORDER — FENTANYL CITRATE (PF) 100 MCG/2ML IJ SOLN
INTRAMUSCULAR | Status: AC
  Filled 2023-01-02: qty 2

## 2023-01-02 MED ORDER — ONDANSETRON HCL 4 MG/2ML IJ SOLN
4.0000 mg | Freq: Four times a day (QID) | INTRAMUSCULAR | Status: DC | PRN
  Administered 2023-01-02 – 2023-01-05 (×6): 4 mg via INTRAVENOUS
  Filled 2023-01-02 (×6): qty 2

## 2023-01-02 MED ORDER — OXYCODONE HCL 5 MG PO TABS: 5.0000 mg | ORAL_TABLET | Freq: Once | ORAL | Status: DC | PRN

## 2023-01-02 MED ORDER — SODIUM CHLORIDE 0.9 % IV SOLN: INTRAVENOUS | Status: DC

## 2023-01-02 MED ORDER — PRAMIPEXOLE DIHYDROCHLORIDE 0.25 MG PO TABS
0.5000 mg | ORAL_TABLET | Freq: Two times a day (BID) | ORAL | Status: DC
  Administered 2023-01-03 – 2023-01-04 (×4): 0.5 mg via ORAL
  Filled 2023-01-02 (×4): qty 2

## 2023-01-02 MED ORDER — ONDANSETRON 4 MG PO TBDP
4.0000 mg | ORAL_TABLET | Freq: Four times a day (QID) | ORAL | Status: DC | PRN
  Administered 2023-01-06: 4 mg via ORAL
  Filled 2023-01-02: qty 1

## 2023-01-02 MED ORDER — ONDANSETRON 4 MG PO TBDP: 4.0000 mg | ORAL_TABLET | Freq: Four times a day (QID) | ORAL | Status: DC | PRN

## 2023-01-02 MED ORDER — BUPIVACAINE LIPOSOME 1.3 % IJ SUSP
INTRAMUSCULAR | Status: AC
  Filled 2023-01-02: qty 20

## 2023-01-02 MED ORDER — DIPHENHYDRAMINE HCL 50 MG/ML IJ SOLN: 12.5000 mg | Freq: Four times a day (QID) | INTRAMUSCULAR | Status: DC | PRN

## 2023-01-02 MED ORDER — PIPERACILLIN-TAZOBACTAM 3.375 G IVPB 30 MIN
3.3750 g | Freq: Once | INTRAVENOUS | Status: AC
  Administered 2023-01-02: 3.375 g via INTRAVENOUS
  Filled 2023-01-02: qty 50

## 2023-01-02 MED ORDER — ENOXAPARIN SODIUM 40 MG/0.4ML IJ SOSY
40.0000 mg | PREFILLED_SYRINGE | INTRAMUSCULAR | Status: DC
  Administered 2023-01-03 – 2023-01-07 (×5): 40 mg via SUBCUTANEOUS
  Filled 2023-01-02 (×5): qty 0.4

## 2023-01-02 MED ORDER — ACETAMINOPHEN 10 MG/ML IV SOLN: 1000.0000 mg | Freq: Once | INTRAVENOUS | Status: DC | PRN

## 2023-01-02 MED ORDER — BUPIVACAINE HCL (PF) 0.25 % IJ SOLN
INTRAMUSCULAR | Status: AC
  Filled 2023-01-02: qty 30

## 2023-01-02 MED ORDER — LACTATED RINGERS IV SOLN: INTRAVENOUS | Status: DC

## 2023-01-02 MED ORDER — FENTANYL CITRATE (PF) 100 MCG/2ML IJ SOLN
25.0000 ug | INTRAMUSCULAR | Status: DC | PRN
  Administered 2023-01-02: 50 ug via INTRAVENOUS
  Administered 2023-01-02: 100 ug via INTRAVENOUS

## 2023-01-02 MED ORDER — ALPRAZOLAM 0.5 MG PO TABS: 0.5000 mg | ORAL_TABLET | Freq: Three times a day (TID) | ORAL | Status: DC | PRN

## 2023-01-02 MED ORDER — ROCURONIUM BROMIDE 100 MG/10ML IV SOLN
INTRAVENOUS | Status: DC | PRN
  Administered 2023-01-02: 70 mg via INTRAVENOUS
  Administered 2023-01-02: 10 mg via INTRAVENOUS

## 2023-01-02 MED ORDER — ONDANSETRON HCL 4 MG/2ML IJ SOLN: 4.0000 mg | Freq: Four times a day (QID) | INTRAMUSCULAR | Status: DC | PRN

## 2023-01-02 MED ORDER — SUGAMMADEX SODIUM 200 MG/2ML IV SOLN
INTRAVENOUS | Status: DC | PRN
  Administered 2023-01-02: 200 mg via INTRAVENOUS

## 2023-01-02 MED ORDER — IOHEXOL 300 MG/ML  SOLN
100.0000 mL | Freq: Once | INTRAMUSCULAR | Status: AC | PRN
  Administered 2023-01-02: 100 mL via INTRAVENOUS

## 2023-01-02 MED ORDER — PROPOFOL 10 MG/ML IV BOLUS
INTRAVENOUS | Status: DC | PRN
  Administered 2023-01-02: 100 mg via INTRAVENOUS
  Administered 2023-01-02: 30 mg via INTRAVENOUS

## 2023-01-02 MED ORDER — METRONIDAZOLE 500 MG/100ML IV SOLN
500.0000 mg | Freq: Two times a day (BID) | INTRAVENOUS | Status: DC
  Administered 2023-01-02 – 2023-01-06 (×8): 500 mg via INTRAVENOUS
  Filled 2023-01-02 (×9): qty 100

## 2023-01-02 MED ORDER — BUPIVACAINE-EPINEPHRINE 0.25% -1:200000 IJ SOLN
INTRAMUSCULAR | Status: DC | PRN
  Administered 2023-01-02: 30 mL

## 2023-01-02 MED ORDER — SEVOFLURANE IN SOLN
RESPIRATORY_TRACT | Status: AC
  Filled 2023-01-02: qty 250

## 2023-01-02 MED ORDER — ALPRAZOLAM 0.5 MG PO TABS
1.0000 mg | ORAL_TABLET | Freq: Every evening | ORAL | Status: DC | PRN
  Administered 2023-01-02 – 2023-01-06 (×5): 1 mg via ORAL
  Filled 2023-01-02 (×5): qty 2

## 2023-01-02 MED ORDER — PANTOPRAZOLE SODIUM 40 MG IV SOLR
40.0000 mg | Freq: Every day | INTRAVENOUS | Status: DC
  Administered 2023-01-02 – 2023-01-03 (×2): 40 mg via INTRAVENOUS
  Filled 2023-01-02 (×2): qty 10

## 2023-01-02 MED ORDER — DEXMEDETOMIDINE HCL IN NACL 80 MCG/20ML IV SOLN
INTRAVENOUS | Status: DC | PRN
  Administered 2023-01-02 (×2): 8 ug via BUCCAL

## 2023-01-02 MED ORDER — MIDAZOLAM HCL 2 MG/2ML IJ SOLN
INTRAMUSCULAR | Status: AC
  Filled 2023-01-02: qty 2

## 2023-01-02 MED ORDER — MIDAZOLAM HCL 2 MG/2ML IJ SOLN
INTRAMUSCULAR | Status: DC | PRN
  Administered 2023-01-02: 2 mg via INTRAVENOUS

## 2023-01-02 MED ORDER — ACETAMINOPHEN 500 MG PO TABS
1000.0000 mg | ORAL_TABLET | Freq: Four times a day (QID) | ORAL | Status: DC
  Administered 2023-01-03 – 2023-01-07 (×17): 1000 mg via ORAL
  Filled 2023-01-02 (×18): qty 2

## 2023-01-02 MED ORDER — EPHEDRINE SULFATE (PRESSORS) 50 MG/ML IJ SOLN
INTRAMUSCULAR | Status: DC | PRN
  Administered 2023-01-02: 10 mg via INTRAVENOUS

## 2023-01-02 MED ORDER — PHENYLEPHRINE HCL (PRESSORS) 10 MG/ML IV SOLN
INTRAVENOUS | Status: DC | PRN
  Administered 2023-01-02: 80 ug via INTRAVENOUS

## 2023-01-02 MED ORDER — MORPHINE SULFATE (PF) 2 MG/ML IV SOLN
2.0000 mg | INTRAVENOUS | Status: DC | PRN
  Filled 2023-01-02: qty 1

## 2023-01-02 MED ORDER — ONDANSETRON HCL 4 MG/2ML IJ SOLN
4.0000 mg | Freq: Once | INTRAMUSCULAR | Status: AC
  Administered 2023-01-02: 4 mg via INTRAVENOUS
  Filled 2023-01-02: qty 2

## 2023-01-02 MED ORDER — PRAMIPEXOLE DIHYDROCHLORIDE 0.25 MG PO TABS
0.7500 mg | ORAL_TABLET | Freq: Two times a day (BID) | ORAL | Status: DC
  Administered 2023-01-03 – 2023-01-05 (×5): 0.75 mg via ORAL
  Filled 2023-01-02 (×5): qty 3

## 2023-01-02 MED ORDER — ONDANSETRON HCL 4 MG/2ML IJ SOLN: 4.0000 mg | Freq: Once | INTRAMUSCULAR | Status: DC | PRN

## 2023-01-02 MED ORDER — OXYCODONE HCL 5 MG/5ML PO SOLN: 5.0000 mg | Freq: Once | ORAL | Status: DC | PRN

## 2023-01-02 MED ORDER — BUPIVACAINE LIPOSOME 1.3 % IJ SUSP
INTRAMUSCULAR | Status: DC | PRN
  Administered 2023-01-02: 20 mL

## 2023-01-02 MED ORDER — LIDOCAINE HCL (CARDIAC) PF 100 MG/5ML IV SOSY
PREFILLED_SYRINGE | INTRAVENOUS | Status: DC | PRN
  Administered 2023-01-02: 100 mg via INTRAVENOUS

## 2023-01-02 MED ORDER — KETOROLAC TROMETHAMINE 15 MG/ML IJ SOLN
15.0000 mg | Freq: Four times a day (QID) | INTRAMUSCULAR | Status: DC
  Administered 2023-01-02: 15 mg via INTRAVENOUS
  Administered 2023-01-02: 30 mg via INTRAVENOUS
  Administered 2023-01-03 – 2023-01-07 (×17): 15 mg via INTRAVENOUS
  Filled 2023-01-02 (×19): qty 1

## 2023-01-02 MED ORDER — OXYCODONE HCL 5 MG PO TABS
5.0000 mg | ORAL_TABLET | ORAL | Status: DC | PRN
  Administered 2023-01-02 – 2023-01-05 (×4): 10 mg via ORAL
  Filled 2023-01-02: qty 1
  Filled 2023-01-02 (×3): qty 2

## 2023-01-02 MED ORDER — SODIUM CHLORIDE 0.9 % IV SOLN
2.0000 g | INTRAVENOUS | Status: DC
  Administered 2023-01-02 – 2023-01-05 (×4): 2 g via INTRAVENOUS
  Filled 2023-01-02 (×2): qty 2
  Filled 2023-01-02 (×2): qty 20
  Filled 2023-01-02: qty 2

## 2023-01-02 MED ORDER — OXYCODONE HCL 5 MG PO TABS
5.0000 mg | ORAL_TABLET | ORAL | Status: DC | PRN
  Filled 2023-01-02: qty 2

## 2023-01-02 MED ORDER — ACETAMINOPHEN 500 MG PO TABS
1000.0000 mg | ORAL_TABLET | Freq: Four times a day (QID) | ORAL | Status: DC
  Administered 2023-01-02 (×3): 1000 mg via ORAL
  Filled 2023-01-02 (×3): qty 2

## 2023-01-02 MED ORDER — PIPERACILLIN-TAZOBACTAM 3.375 G IVPB: 3.3750 g | Freq: Three times a day (TID) | INTRAVENOUS | Status: DC

## 2023-01-02 MED ORDER — EPINEPHRINE PF 1 MG/ML IJ SOLN
INTRAMUSCULAR | Status: AC
  Filled 2023-01-02: qty 1

## 2023-01-02 MED ORDER — PROPOFOL 10 MG/ML IV BOLUS
INTRAVENOUS | Status: AC
  Filled 2023-01-02: qty 20

## 2023-01-02 SURGICAL SUPPLY — 56 items
APPLIER CLIP 5 13 M/L LIGAMAX5 (MISCELLANEOUS)
BARRIER ADH SEPRAFILM 3INX5IN (MISCELLANEOUS) IMPLANT
BLADE CLIPPER SURG (BLADE) ×1 IMPLANT
BULB RESERV EVAC DRAIN JP 100C (MISCELLANEOUS) IMPLANT
CLIP APPLIE 5 13 M/L LIGAMAX5 (MISCELLANEOUS) IMPLANT
CUTTER FLEX LINEAR 45M (STAPLE) ×1 IMPLANT
DERMABOND ADVANCED .7 DNX12 (GAUZE/BANDAGES/DRESSINGS) ×1 IMPLANT
DRAIN CHANNEL JP 19F (MISCELLANEOUS) IMPLANT
DRAPE INCISE IOBAN 66X45 STRL (DRAPES) IMPLANT
DRSG OPSITE POSTOP 3X4 (GAUZE/BANDAGES/DRESSINGS) IMPLANT
DRSG OPSITE POSTOP 4X6 (GAUZE/BANDAGES/DRESSINGS) IMPLANT
DRSG TEGADERM 4X4.75 (GAUZE/BANDAGES/DRESSINGS) IMPLANT
ELECT CAUTERY BLADE 6.4 (BLADE) ×1 IMPLANT
ELECT CAUTERY BLADE TIP 2.5 (TIP) ×1
ELECT REM PT RETURN 9FT ADLT (ELECTROSURGICAL) ×1
ELECTRODE CAUTERY BLDE TIP 2.5 (TIP) ×1 IMPLANT
ELECTRODE REM PT RTRN 9FT ADLT (ELECTROSURGICAL) ×1 IMPLANT
GLOVE BIO SURGEON STRL SZ7 (GLOVE) ×1 IMPLANT
GOWN STRL REUS W/ TWL LRG LVL3 (GOWN DISPOSABLE) ×2 IMPLANT
GOWN STRL REUS W/TWL LRG LVL3 (GOWN DISPOSABLE) ×2
IRRIGATION STRYKERFLOW (MISCELLANEOUS) ×1 IMPLANT
IRRIGATOR STRYKERFLOW (MISCELLANEOUS) ×1
IV NS 1000ML (IV SOLUTION) ×1
IV NS 1000ML BAXH (IV SOLUTION) ×1 IMPLANT
MANIFOLD NEPTUNE II (INSTRUMENTS) ×1 IMPLANT
NDL HYPO 22X1.5 SAFETY MO (MISCELLANEOUS) ×1 IMPLANT
NEEDLE HYPO 22X1.5 SAFETY MO (MISCELLANEOUS) ×1 IMPLANT
NS IRRIG 500ML POUR BTL (IV SOLUTION) ×1 IMPLANT
PACK LAP CHOLECYSTECTOMY (MISCELLANEOUS) ×1 IMPLANT
PENCIL SMOKE EVACUATOR (MISCELLANEOUS) ×1 IMPLANT
RELOAD 45 VASCULAR/THIN (ENDOMECHANICALS) ×3 IMPLANT
RELOAD STAPLE 45 2.5 WHT GRN (ENDOMECHANICALS) IMPLANT
RELOAD STAPLE 45 3.5 BLU ETS (ENDOMECHANICALS) ×1 IMPLANT
RELOAD STAPLE TA45 3.5 REG BLU (ENDOMECHANICALS) IMPLANT
SCISSORS METZENBAUM CVD 33 (INSTRUMENTS) IMPLANT
SET TUBE SMOKE EVAC HIGH FLOW (TUBING) ×1 IMPLANT
SHEARS HARMONIC ACE PLUS 36CM (ENDOMECHANICALS) ×1 IMPLANT
SLEEVE Z-THREAD 5X100MM (TROCAR) ×1 IMPLANT
SPONGE DRAIN TRACH 4X4 STRL 2S (GAUZE/BANDAGES/DRESSINGS) IMPLANT
SPONGE T-LAP 18X18 ~~LOC~~+RFID (SPONGE) ×1 IMPLANT
STAPLER SKIN PROX 35W (STAPLE) IMPLANT
SUT ETHILON 3-0 (SUTURE) IMPLANT
SUT MNCRL AB 4-0 PS2 18 (SUTURE) ×1 IMPLANT
SUT PDS AB 0 CT1 27 (SUTURE) IMPLANT
SUT VICRYL 0 UR6 27IN ABS (SUTURE) ×2 IMPLANT
SWAB CULTURE AMIES ANAERIB BLU (MISCELLANEOUS) IMPLANT
SYR 20ML LL LF (SYRINGE) ×1 IMPLANT
SYS BAG RETRIEVAL 10MM (BASKET) ×1
SYS LAPSCP GELPORT 120MM (MISCELLANEOUS) ×1
SYSTEM BAG RETRIEVAL 10MM (BASKET) ×1 IMPLANT
SYSTEM LAPSCP GELPORT 120MM (MISCELLANEOUS) IMPLANT
TRAP FLUID SMOKE EVACUATOR (MISCELLANEOUS) ×1 IMPLANT
TRAY FOLEY MTR SLVR 16FR STAT (SET/KITS/TRAYS/PACK) ×1 IMPLANT
TROCAR XCEL BLUNT TIP 100MML (ENDOMECHANICALS) ×1 IMPLANT
TROCAR Z-THREAD FIOS 5X100MM (TROCAR) ×1 IMPLANT
WATER STERILE IRR 500ML POUR (IV SOLUTION) ×1 IMPLANT

## 2023-01-02 NOTE — ED Notes (Signed)
General surgery at bedside. 

## 2023-01-02 NOTE — H&P (Signed)
[facility] SURGICAL HISTORY & PHYSICAL (cpt [phone])  HISTORY OF PRESENT ILLNESS (HPI):  72 y.o. female presented to [facility] ED today for abdominal pain. Patient reports the acute onset of initially lower back pain yesterday around 5PM which then radiated to her lower abdomen. She reports RLQ is now the most severe. She reports feeling feverish with chills and nausea at home. No cough, CP, SOB, emesis, bowel changes, or urinary changes. No history of similar in the past. Remote history of hernia repair as a child. Work up in the ED revealed a mild leukocytosis to 12.1K. CT abdomen/pelvis was concerning for acute uncomplicated appendicitis.   General surgery is consulted by emergency medicine physician Dr Jene Every, MD for evaluation and management of acute uncomplicated appendicitis.   PAST MEDICAL HISTORY (PMH):  Past Medical History:  Diagnosis Date   GAD (generalized anxiety disorder)    Hyperlipidemia    Hypothyroidism    Osteopenia    Sinusitis     Reviewed. Otherwise negative.   PAST SURGICAL HISTORY (PSH):  Past Surgical History:  Procedure Laterality Date   AUGMENTATION MAMMAPLASTY     HUMERUS FRACTURE SURGERY  2013    Reviewed. Otherwise negative.   MEDICATIONS:  Prior to Admission medications   Medication Sig Start Date End Date Taking? Authorizing Provider  ALPRAZolam Prudy Feeler) 1 MG tablet 1/2 tablet 3 times daily as needed for anxiety and 1 tablet at night Patient taking differently: Taking 1/2 tablet twice daily and 1 and 1/2 tablets at night 12/05/22   Cottle, Steva Ready., MD  atorvastatin (LIPITOR) 20 MG tablet Take 20 mg by mouth.  02/20/18   [provider]  calcium citrate-vitamin D (CITRACAL+D) 315-200 MG-UNIT tablet Take by mouth.    [provider]  escitalopram (LEXAPRO) 20 MG tablet Take 1 tablet (20 mg total) by mouth daily. 12/11/22   Cottle, Steva Ready., MD  fluticasone Aleda Grana) 50 MCG/ACT nasal spray fluticasone propionate  50 mcg/actuation nasal spray,suspension 10/06/14   [provider]  levothyroxine (SYNTHROID, LEVOTHROID) 50 MCG tablet Take 50 mcg by mouth daily before breakfast.    [provider]  pramipexole (MIRAPEX) 0.5 MG tablet 1 and 1/2 tablets in the AM and 6 PM and 1 tablet at noon and bedtime 12/27/22   Cottle, Steva Ready., MD  raloxifene (EVISTA) 60 MG tablet raloxifene 60 mg tablet 10/07/14   [provider]  Benay Spice 5 % SOLN  10/29/19   [provider]     ALLERGIES:  Allergies  Allergen Reactions   Hydrocodone-Acetaminophen Other (See Comments)    Passes out   Morphine Other (See Comments)    Passes out     SOCIAL HISTORY:  Social History   Socioeconomic History   Marital status: Married    Spouse name: Not on file   Number of children: Not on file   Years of education: Not on file   Highest education level: Not on file  Occupational History   Not on file  Tobacco Use   Smoking status: Never   Smokeless tobacco: Never  Vaping Use   Vaping Use: Never used  Substance and Sexual Activity   Alcohol use: Not Currently    Comment: social   Drug use: Never   Sexual activity: Not on file  Other Topics Concern   Not on file  Social History Narrative   Married.   1 child. 2 grandchildren.   Retired Once worked as a Psychologist, sport and exercise.   Enjoys  exercising, spending time with family   Social Determinants of Health   Financial Resource Strain: Low Risk  (02/24/2022)   Overall Financial Resource Strain (CARDIA)    Difficulty of Paying Living Expenses: Not hard at all  Food Insecurity: No Food Insecurity (02/24/2022)   Hunger Vital Sign    Worried About Running Out of Food in the Last Year: Never true    Ran Out of Food in the Last Year: Never true  Transportation Needs: No Transportation Needs (02/24/2022)   PRAPARE - Administrator, Civil Service (Medical): No    Lack of Transportation (Non-Medical): No  Physical Activity: Sufficiently  Active (02/24/2022)   Exercise Vital Sign    Days of Exercise per Week: 5 days    Minutes of Exercise per Session: 90 min  Stress: No Stress Concern Present (02/24/2022)   [employer] of Occupational Health - Occupational Stress Questionnaire    Feeling of Stress : Not at all  Social Connections: Not on file  Intimate Partner Violence: Not on file     FAMILY HISTORY:  Family History  Problem Relation Age of Onset   Arthritis Mother    COPD Mother    Hypercholesterolemia Mother    Dementia Mother    Stroke Mother    Prostate cancer Brother    Cancer Maternal Grandmother        Oral    Otherwise negative.   REVIEW OF SYSTEMS:  Review of Systems  Constitutional:  Positive for chills and fever (subjective).  Respiratory:  Negative for cough and shortness of breath.   Cardiovascular:  Negative for chest pain and palpitations.  Gastrointestinal:  Positive for abdominal pain and nausea. Negative for constipation, diarrhea and vomiting.  Genitourinary:  Negative for dysuria and urgency.  All other systems reviewed and are negative.   VITAL SIGNS:  Temp:  [98.3 F (36.8 C)] 98.3 F (36.8 C) (04/09 0601) Pulse Rate:  [78] 78 (04/09 0601) Resp:  [18] 18 (04/09 0601) BP: (108)/(60) 108/60 (04/09 0601) SpO2:  [96 %] 96 % (04/09 0601) Weight:  [59 kg] 59 kg (04/09 0601)     Height: 5\' 2"  (157.5 cm) Weight: 59 kg BMI (Calculated): 23.77   PHYSICAL EXAM:  Physical Exam Vitals and nursing note reviewed. Exam conducted with a chaperone present.  Constitutional:      General: She is not in acute distress.    Appearance: Normal appearance. She is normal weight. She is not ill-appearing.  HENT:     Head: Normocephalic and atraumatic.  Eyes:     General: No scleral icterus.    Conjunctiva/sclera: Conjunctivae normal.  Cardiovascular:     Rate and Rhythm: Normal rate.     Pulses: Normal pulses.  Pulmonary:     Effort: Pulmonary effort is normal. No respiratory distress.   Abdominal:     General: Abdomen is flat. There is no distension.     Palpations: Abdomen is soft.     Tenderness: There is abdominal tenderness in the right lower quadrant. There is no guarding or rebound. Positive signs include McBurney's sign.  Genitourinary:    Comments: Deferred Skin:    General: Skin is warm and dry.  Neurological:     General: No focal deficit present.     Mental Status: She is alert and oriented to person, place, and time.  Psychiatric:        Mood and Affect: Mood normal.     INTAKE/OUTPUT:  This shift: No intake/output data  recorded.  Last 2 shifts: @IOLAST2SHIFTS @  Labs:     Latest Ref Rng & Units 01/02/2023    6:02 AM 11/06/2022    3:11 PM  CBC  WBC 4.0 - 10.5 K/uL 12.1  8.0   Hemoglobin 12.0 - 15.0 g/dL 82.912.1  56.213.0   Hematocrit 36.0 - 46.0 % 36.6  38.4   Platelets 150 - 400 K/uL 257  323       Latest Ref Rng & Units 01/02/2023    6:02 AM 11/06/2022    3:11 PM  CMP  Glucose 70 - 99 mg/dL 130123  [id number]   BUN 8 - 23 mg/dL 10  12   Creatinine 7.840.44 - 1.00 mg/dL 6.960.64  2.950.67   Sodium 284135 - 145 mmol/L 131  136   Potassium 3.5 - 5.1 mmol/L 3.5  3.8   Chloride 98 - 111 mmol/L 101  101   CO2 22 - 32 mmol/L 25  26   Calcium 8.9 - 10.3 mg/dL 8.1  8.8   Total Protein 6.5 - 8.1 g/dL 6.3    Total Bilirubin 0.3 - 1.2 mg/dL 0.8    Alkaline Phos 38 - 126 U/L 33    AST 15 - 41 U/L 37    ALT 0 - 44 U/L 27      Imaging studies:   CT Abdomen/Pelvis (01/02/2023) personally reviewed showing inflamed proximal appendix, likely with appendicolith, no perforation or abscess, and radiologist report reviewed below:  IMPRESSION: 1. Imaging findings concerning for acute appendicitis. No signs of perforation or abscess. 2. Sigmoid diverticulosis without signs of acute diverticulitis. 3. Small hiatal hernia. 4. Aortic Atherosclerosis (ICD10-I70.0).   Assessment/Plan: (ICD-10's: K30.80) 72 y.o. female with acute uncomplicated appendicitis   - Will admit to general  surgery - Plan for laparoscopic appendectomy this afternoon with Dr Everlene FarrierPabon pending OR/Anesthesia availability  - All risks, benefits, and alternatives to above procedure(s) were discussed with the patient and her family, all of their questions were answered to their expressed satisfaction, patient expresses she wishes to proceed, and informed consent was obtained.  - NPO + IVF Resuscitation - IV Abx (Zosyn) - Monitor abdominal examination; on-going bowel function - Pain control prn; antiemetics prn   - Mobilize as tolerated   - DVT prophylaxis; hold for OR  All of the above findings and recommendations were discussed with the patient and her family, and all of their questions were answered to their expressed satisfaction.  -- Lynden OxfordZachary Harlis Champoux, PA-C [facility] 01/02/2023, 8:34 AM M-F: 7am - 4pm

## 2023-01-02 NOTE — ED Triage Notes (Signed)
Pt to triage via w/c with no distress noted; reports since yesterday having lower back pain radiating into abd accomp by nausea; denies hx of same

## 2023-01-02 NOTE — Anesthesia Procedure Notes (Signed)
Procedure Name: Intubation Date/Time: 01/02/2023 3:29 PM  Performed by: Maryla Morrow., CRNAPre-anesthesia Checklist: Patient identified, Patient being monitored, Timeout performed, Emergency Drugs available and Suction available Patient Re-evaluated:Patient Re-evaluated prior to induction Oxygen Delivery Method: Circle system utilized Preoxygenation: Pre-oxygenation with 100% oxygen Induction Type: IV induction Ventilation: Mask ventilation without difficulty Laryngoscope Size: 3 and McGraph Grade View: Grade I Tube type: Oral Tube size: 6.5 mm Number of attempts: 1 Airway Equipment and Method: Stylet Placement Confirmation: ETT inserted through vocal cords under direct vision, positive ETCO2 and breath sounds checked- equal and bilateral Secured at: 21 cm Tube secured with: Tape Dental Injury: Teeth and Oropharynx as per pre-operative assessment

## 2023-01-02 NOTE — Transfer of Care (Signed)
Immediate Anesthesia Transfer of Care Note  Patient: Andrea [name]  Procedure(s) Performed: APPENDECTOMY LAPAROSCOPIC HAND ASSISTED  Patient Location: PACU  Anesthesia Type:General  Level of Consciousness: awake, alert , and oriented  Airway & Oxygen Therapy: Patient Spontanous Breathing  Post-op Assessment: Report given to RN and Post -op Vital signs reviewed and stable  Post vital signs: stable  Last Vitals:  Vitals Value Taken Time  BP 101/57 01/02/23 1649  Temp    Pulse 96 01/02/23 1651  Resp 27 01/02/23 1651  SpO2 96 % 01/02/23 1651  Vitals shown include unvalidated device data.  Last Pain:  Vitals:   01/02/23 1218  TempSrc: Oral  PainSc: 5          Complications: No notable events documented.

## 2023-01-02 NOTE — Op Note (Signed)
Hand assisted laparoscopic partial cecectomy  Cherylin Mylar Date of operation:  01/02/2023  Indications: The patient presented with a history of  abdominal pain. Workup has revealed findings consistent with acute appendicitis.  Pre-operative Diagnosis: Acute appendicitis  Post-operative Diagnosis: Perforated appendicitis  Surgeon: Sterling Big, MD, FACS  Anesthesia: General with endotracheal tube  Findings: purulent fluid from perforated appendicitis at the base I needed to convert to hand assisted to evaluate the need for potential colectomy vs partial cecectomy  Estimated Blood Loss: 20cc         Specimens: appendix cecum         Complications:  none  Procedure Details  The patient was seen again in the preop area. The options of surgery versus observation were reviewed with the patient and/or family. The risks of bleeding, infection, recurrence of symptoms, negative laparoscopy, potential for an open procedure, bowel injury, abscess or infection, were all reviewed as well. The patient was taken to Operating Room, identified as Cherylin Mylar and the procedure verified as laparoscopic appendectomy. A Time Out was held and the above information confirmed.  The patient was placed in the supine position and general anesthesia was induced.  Antibiotic prophylaxis was administered and VT E prophylaxis was in place. A Foley catheter was placed by the nursing staff.   The abdomen was prepped and draped in a sterile fashion. An infraumbilical incision was made. A cutdown technique was used to enter the abdominal cavity. Two vicryl stitches were placed on the fascia and a Hasson trocar inserted. Pneumoperitoneum obtained. Two 5 mm ports were placed under direct visualization.   The appendix was identified and found to be acutely perforated and with purulent fluid around it. Purulent fluid cultured and aspirated/ The appendix was carefully dissected. The mesoappendix was divided with Harmonic  scalpel. The base of the appendix was perforated and I fired one endoscopic stapler, I was not able to discern how close I was to the Terminal ileum. I felt that the safest thing to do was to convert to hand assisted case for tactile feedback and to examine the cecum. I enlarged my incision to 7 cms and placed gel port. We further mobilized the right colon laparoscopically.  Upon doing this we had more space and given that the perforation was at the base of the appendix I needed to resect the cecum partially with multiple laparoscopic white loads. We tested the staple line and there was no leaks.  The appendix, and cecum was placed in a Endo Catch bag and removed  The right lower quadrant and pelvis was then irrigated with  normal saline which was aspirated. Inspection  failed to identify any additional bleeding and there were no signs of bowel injury. Again the right lower quadrant was inspected there was no sign of bleeding or bowel injury therefore pneumoperitoneum was released, all ports were removed. 19 blake was placed RLQ and secured w 3-0 nylon.  The fascia was closed with 0 PDS small bite technique and the skin incisions were approximated with staples. The patient tolerated the procedure well, there were no complications. The sponge lap and needle count were correct at the end of the procedure.  The patient was taken to the recovery room in stable condition to be admitted for continued care.    Sterling Big, MD FACS

## 2023-01-02 NOTE — Anesthesia Postprocedure Evaluation (Signed)
Anesthesia Post Note  Patient: Andrea [name]  Procedure(s) Performed: APPENDECTOMY LAPAROSCOPIC HAND ASSISTED  Patient location during evaluation: PACU Anesthesia Type: General Level of consciousness: awake and alert Pain management: pain level controlled Vital Signs Assessment: post-procedure vital signs reviewed and stable Respiratory status: spontaneous breathing, nonlabored ventilation, respiratory function stable and patient connected to nasal cannula oxygen Cardiovascular status: blood pressure returned to baseline and stable Postop Assessment: no apparent nausea or vomiting Anesthetic complications: no   There were no known notable events for this encounter.   Last Vitals:  Vitals:   01/02/23 1658 01/02/23 1700  BP:  (!) 110/56  Pulse:    Resp:    Temp:    SpO2: 97%     Last Pain:  Vitals:   01/02/23 1700  TempSrc:   PainSc: 0-No pain                 Louie Boston

## 2023-01-02 NOTE — ED Provider Notes (Signed)
[facility] Provider Note    Event Date/Time   First MD Initiated Contact with Patient 01/02/23 [phone]     (approximate)   History   Abdominal pain   HPI  Andrea [name] is a 72 y.o. female with history of generalized anxiety disorder hyperlipidemia, hypothyroidism who presents with complaints of lower abdominal pain.  Patient reports that started yesterday, has been constant, she reports it is moderate to severe in intensity and sharp.  Right lower quadrant slightly more uncomfortable than left lower quadrant.  No history of abdominal surgery besides hernia repair.  Denies dysuria or hematuria     Physical Exam   Triage Vital Signs: ED Triage Vitals  Enc Vitals Group     BP 01/02/23 0601 108/60     Pulse Rate 01/02/23 0601 78     Resp 01/02/23 0601 18     Temp 01/02/23 0601 98.3 F (36.8 C)     Temp Source 01/02/23 0601 Oral     SpO2 01/02/23 0601 96 %     Weight 01/02/23 0601 59 kg (130 lb)     Height 01/02/23 0601 1.575 m (5\' 2" )     Head Circumference --      Peak Flow --      Pain Score 01/02/23 0607 10     Pain Loc --      Pain Edu? --      Excl. in GC? --     Most recent vital signs: Vitals:   01/02/23 0601  BP: 108/60  Pulse: 78  Resp: 18  Temp: 98.3 F (36.8 C)  SpO2: 96%     General: Awake, no distress.  CV:  Good peripheral perfusion.  Resp:  Normal effort.  Abd:  No distention.  Soft, mild right lower quadrant tenderness palpation mild left lower quadrant tenderness to palpation, no CVA tenderness Other:     ED Results / Procedures / Treatments   Labs (all labs ordered are listed, but only abnormal results are displayed) Labs Reviewed  CBC WITH DIFFERENTIAL/PLATELET - Abnormal; Notable for the following components:      Result Value   WBC 12.1 (*)    RBC 3.81 (*)    Neutro Abs 9.6 (*)    All other components within normal limits  COMPREHENSIVE METABOLIC PANEL - Abnormal; Notable for the following components:    Sodium 131 (*)    Glucose, Bld 123 (*)    Calcium 8.1 (*)    Total Protein 6.3 (*)    Alkaline Phosphatase 33 (*)    All other components within normal limits  URINALYSIS, ROUTINE W REFLEX MICROSCOPIC     EKG     RADIOLOGY CT abdomen pelvis, most consistent with appendicitis per radiology    PROCEDURES:  Critical Care performed:   Procedures   MEDICATIONS ORDERED IN ED: Medications  piperacillin-tazobactam (ZOSYN) IVPB 3.375 g (has no administration in time range)  ketorolac (TORADOL) 30 MG/ML injection 30 mg (30 mg Intravenous Given 01/02/23 0728)  ondansetron (ZOFRAN) injection 4 mg (4 mg Intravenous Given 01/02/23 0728)  iohexol (OMNIPAQUE) 300 MG/ML solution 100 mL (100 mLs Intravenous Contrast Given 01/02/23 0750)     IMPRESSION / MDM / ASSESSMENT AND PLAN / ED COURSE  I reviewed the triage vital signs and the nursing notes. Patient's presentation is most consistent with acute presentation with potential threat to life or bodily function.  Patient presents with abdominal pain as detailed above.  Differential includes diverticulitis, appendicitis, UTI,  ureterolithiasis.  Will treat with IV Toradol, IV Zofran, labs reviewed mild elevation of white blood cell count, pending urinalysis  Obtain CT abdomen pelvis to evaluate for diverticulitis appendicitis ureterolithiasis  CT scan is consistent with appendicitis, no evidence of rupture.  I have consulted the surgical team.  IV Zosyn ordered      FINAL CLINICAL IMPRESSION(S) / ED DIAGNOSES   Final diagnoses:  Acute appendicitis, unspecified acute appendicitis type     Rx / DC Orders   ED Discharge Orders     None        Note:  This document was prepared using Dragon voice recognition software and may include unintentional dictation errors.   Jene Every, MD 01/02/23 [phone]

## 2023-01-02 NOTE — Anesthesia Preprocedure Evaluation (Addendum)
Anesthesia Evaluation  Patient identified by MRN, date of birth, ID band Patient awake    Reviewed: Allergy & Precautions, H&P , NPO status , Patient's Chart, lab work & pertinent test results  Airway Mallampati: III  TM Distance: >3 FB Neck ROM: full    Dental no notable dental hx.    Pulmonary neg pulmonary ROS   Pulmonary exam normal        Cardiovascular negative cardio ROS Normal cardiovascular exam  ECG 11/06/22: normal   Neuro/Psych  PSYCHIATRIC DISORDERS Anxiety Depression    negative neurological ROS     GI/Hepatic negative GI ROS, Neg liver ROS,,,  Endo/Other  Hypothyroidism    Renal/GU      Musculoskeletal   Abdominal Normal abdominal exam  (+)   Peds  Hematology negative hematology ROS (+)   Anesthesia Other Findings Past Medical History: No date: GAD (generalized anxiety disorder) No date: Hyperlipidemia No date: Hypothyroidism No date: Osteopenia No date: Sinusitis  Past Surgical History: No date: AUGMENTATION MAMMAPLASTY 2013: HUMERUS FRACTURE SURGERY  BMI    Body Mass Index: 23.78 kg/m      Reproductive/Obstetrics negative OB ROS                              Anesthesia Physical Anesthesia Plan  ASA: 2  Anesthesia Plan: General   Post-op Pain Management: Tylenol PO (pre-op)* and Toradol IV (intra-op)*   Induction: Intravenous  PONV Risk Score and Plan: 4 or greater and Ondansetron, Dexamethasone, Propofol infusion and Midazolam  Airway Management Planned: Oral ETT  Additional Equipment:   Intra-op Plan:   Post-operative Plan: Extubation in OR  Informed Consent: I have reviewed the patients History and Physical, chart, labs and discussed the procedure including the risks, benefits and alternatives for the proposed anesthesia with the patient or authorized representative who has indicated his/her understanding and acceptance.     Dental Advisory  Given  Plan Discussed with: CRNA and Surgeon  Anesthesia Plan Comments:          Anesthesia Quick Evaluation

## 2023-01-03 ENCOUNTER — Encounter: Payer: Self-pay | Admitting: Surgery

## 2023-01-03 DIAGNOSIS — E871 Hypo-osmolality and hyponatremia: Secondary | ICD-10-CM | POA: Diagnosis present

## 2023-01-03 DIAGNOSIS — F411 Generalized anxiety disorder: Secondary | ICD-10-CM | POA: Diagnosis present

## 2023-01-03 DIAGNOSIS — Z8261 Family history of arthritis: Secondary | ICD-10-CM | POA: Diagnosis not present

## 2023-01-03 DIAGNOSIS — Z83438 Family history of other disorder of lipoprotein metabolism and other lipidemia: Secondary | ICD-10-CM | POA: Diagnosis not present

## 2023-01-03 DIAGNOSIS — Z808 Family history of malignant neoplasm of other organs or systems: Secondary | ICD-10-CM | POA: Diagnosis not present

## 2023-01-03 DIAGNOSIS — E785 Hyperlipidemia, unspecified: Secondary | ICD-10-CM | POA: Diagnosis present

## 2023-01-03 DIAGNOSIS — Z885 Allergy status to narcotic agent status: Secondary | ICD-10-CM | POA: Diagnosis not present

## 2023-01-03 DIAGNOSIS — Z79899 Other long term (current) drug therapy: Secondary | ICD-10-CM | POA: Diagnosis not present

## 2023-01-03 DIAGNOSIS — Z79890 Hormone replacement therapy (postmenopausal): Secondary | ICD-10-CM | POA: Diagnosis not present

## 2023-01-03 DIAGNOSIS — K3580 Unspecified acute appendicitis: Secondary | ICD-10-CM | POA: Diagnosis present

## 2023-01-03 DIAGNOSIS — R109 Unspecified abdominal pain: Secondary | ICD-10-CM | POA: Diagnosis not present

## 2023-01-03 DIAGNOSIS — K567 Ileus, unspecified: Secondary | ICD-10-CM | POA: Diagnosis present

## 2023-01-03 DIAGNOSIS — J90 Pleural effusion, not elsewhere classified: Secondary | ICD-10-CM | POA: Diagnosis not present

## 2023-01-03 DIAGNOSIS — J9811 Atelectasis: Secondary | ICD-10-CM | POA: Diagnosis not present

## 2023-01-03 DIAGNOSIS — E039 Hypothyroidism, unspecified: Secondary | ICD-10-CM | POA: Diagnosis present

## 2023-01-03 DIAGNOSIS — Z820 Family history of epilepsy and other diseases of the nervous system: Secondary | ICD-10-CM | POA: Diagnosis not present

## 2023-01-03 DIAGNOSIS — K3532 Acute appendicitis with perforation and localized peritonitis, without abscess: Secondary | ICD-10-CM | POA: Diagnosis present

## 2023-01-03 DIAGNOSIS — Z825 Family history of asthma and other chronic lower respiratory diseases: Secondary | ICD-10-CM | POA: Diagnosis not present

## 2023-01-03 DIAGNOSIS — Z823 Family history of stroke: Secondary | ICD-10-CM | POA: Diagnosis not present

## 2023-01-03 LAB — CBC
HCT: 31.6 % — ABNORMAL LOW (ref 36.0–46.0)
Hemoglobin: 10.7 g/dL — ABNORMAL LOW (ref 12.0–15.0)
MCH: 31.8 pg (ref 26.0–34.0)
MCHC: 33.9 g/dL (ref 30.0–36.0)
MCV: 94 fL (ref 80.0–100.0)
Platelets: 207 10*3/uL (ref 150–400)
RBC: 3.36 MIL/uL — ABNORMAL LOW (ref 3.87–5.11)
RDW: 11.9 % (ref 11.5–15.5)
WBC: 15.1 10*3/uL — ABNORMAL HIGH (ref 4.0–10.5)
nRBC: 0 % (ref 0.0–0.2)

## 2023-01-03 LAB — BASIC METABOLIC PANEL
Anion gap: 6 (ref 5–15)
BUN: 10 mg/dL (ref 8–23)
CO2: 24 mmol/L (ref 22–32)
Calcium: 7.9 mg/dL — ABNORMAL LOW (ref 8.9–10.3)
Chloride: 97 mmol/L — ABNORMAL LOW (ref 98–111)
Creatinine, Ser: 0.72 mg/dL (ref 0.44–1.00)
GFR, Estimated: >60 mL/min (ref >60)
Glucose, Bld: 164 mg/dL — ABNORMAL HIGH (ref 70–99)
Potassium: 4.1 mmol/L (ref 3.5–5.1)
Sodium: 127 mmol/L — ABNORMAL LOW (ref 135–145)

## 2023-01-03 LAB — URINALYSIS, ROUTINE W REFLEX MICROSCOPIC
Bacteria, UA: NONE SEEN
Bilirubin Urine: NEGATIVE
Glucose, UA: NEGATIVE mg/dL
Hgb urine dipstick: NEGATIVE
Ketones, ur: NEGATIVE mg/dL
Nitrite: NEGATIVE
Protein, ur: NEGATIVE mg/dL
Specific Gravity, Urine: 1.017 (ref 1.005–1.030)
pH: 5 (ref 5.0–8.0)

## 2023-01-03 LAB — AEROBIC/ANAEROBIC CULTURE W GRAM STAIN (SURGICAL/DEEP WOUND)

## 2023-01-03 LAB — HIV ANTIBODY (ROUTINE TESTING W REFLEX): HIV Screen 4th Generation wRfx: NONREACTIVE

## 2023-01-03 LAB — PHOSPHORUS: Phosphorus: 3.8 mg/dL (ref 2.5–4.6)

## 2023-01-03 MED ORDER — FLUTICASONE PROPIONATE 50 MCG/ACT NA SUSP
1.0000 | Freq: Every day | NASAL | Status: DC
  Administered 2023-01-06: 1 via NASAL
  Filled 2023-01-03: qty 16

## 2023-01-03 MED ORDER — LEVOTHYROXINE SODIUM 50 MCG PO TABS
50.0000 ug | ORAL_TABLET | Freq: Every day | ORAL | Status: DC
  Administered 2023-01-03 – 2023-01-07 (×5): 50 ug via ORAL
  Filled 2023-01-03 (×5): qty 1

## 2023-01-03 MED ORDER — ATORVASTATIN CALCIUM 20 MG PO TABS
20.0000 mg | ORAL_TABLET | Freq: Every day | ORAL | Status: DC
  Administered 2023-01-03 – 2023-01-07 (×5): 20 mg via ORAL
  Filled 2023-01-03 (×5): qty 1

## 2023-01-03 MED ORDER — DIPHENHYDRAMINE HCL 25 MG PO CAPS
25.0000 mg | ORAL_CAPSULE | Freq: Four times a day (QID) | ORAL | Status: DC | PRN
  Administered 2023-01-03: 25 mg via ORAL
  Filled 2023-01-03: qty 1

## 2023-01-03 MED ORDER — ESCITALOPRAM OXALATE 10 MG PO TABS
20.0000 mg | ORAL_TABLET | Freq: Every day | ORAL | Status: DC
  Administered 2023-01-03 – 2023-01-07 (×5): 20 mg via ORAL
  Filled 2023-01-03 (×6): qty 2

## 2023-01-03 MED ORDER — RALOXIFENE HCL 60 MG PO TABS
60.0000 mg | ORAL_TABLET | Freq: Every day | ORAL | Status: DC
  Administered 2023-01-03 – 2023-01-07 (×5): 60 mg via ORAL
  Filled 2023-01-03 (×5): qty 1

## 2023-01-03 NOTE — Discharge Instructions (Signed)
In addition to included general post-operative instructions,  Diet: Resume home diet.   Activity: No heavy lifting >20 pounds (children, pets, laundry, garbage) or strenuous activity fo 6 weeks, but light activity and walking are encouraged. Do not drive or drink alcohol if taking narcotic pain medications or having pain that might distract from driving.  Drain: Monitor and record drain output daily. I have provided you a handout for this; please bring this to your follow up appointment.   Wound care: If you can keep drain site covered and water proofed, you may shower/get incision wet with soapy water and pat dry (do not rub incisions), but no baths or submerging incision underwater until follow-up.   Medications: Resume all home medications. For mild to moderate pain: acetaminophen (Tylenol) or ibuprofen/naproxen (if no kidney disease). Combining Tylenol with alcohol can substantially increase your risk of causing liver disease. Narcotic pain medications, if prescribed, can be used for severe pain, though may cause nausea, constipation, and drowsiness. Do not combine Tylenol and Percocet (or similar) within a 6 hour period as Percocet (and similar) contain(s) Tylenol. If you do not need the narcotic pain medication, you do not need to fill the prescription.  Call office [phone] / [phone]) at any time if any questions, worsening pain, fevers/chills, bleeding, drainage from incision site, or other concerns.

## 2023-01-03 NOTE — Progress Notes (Signed)
Mobility Specialist - Progress Note   01/03/23 1000  Mobility  Activity Ambulated independently in hallway  Level of Assistance Independent  Assistive Device Front wheel walker  Distance Ambulated (ft) 480 ft  Activity Response Tolerated well  $Mobility charge 1 Mobility     Pt ambulating in hallway on arrival, utilizing RA. Pt denied pain, nausea, fatigue. No SOB or LOB with RW. No dizziness. Reports feeling good today. Does not use AD at baseline. Pt returned to bed with needs in reach.    Filiberto Pinks Mobility Specialist 01/03/23, 10:43 AM

## 2023-01-03 NOTE — Progress Notes (Signed)
[facility] SURGICAL PROGRESS NOTE  Hospital Day(s): 0.   Post op day(s): 1 Day Post-Op.   Interval History:  Patient seen and examined No acute events or new complaints overnight.  Patient reports she is feeling better compared to admission Abdomen sore expectedly No fever, chills, nausea, emesis  Leukocytosis slightly worse; 15.1K; likely reactive from procedure Hgb to 10.7; suspect primarily dilutional Renal function normal; sCr - 0.72; UO - unmeasured Hyponatremia to 127 Surgical drain with 30 ccs out; darker thicker serosanguinous appearing  On CLD; tolerating   Vital signs in last 24 hours: [min-max] current  Temp:  [97.7 F (36.5 C)-100.3 F (37.9 C)] 98.1 F (36.7 C) (04/10 0809) Pulse Rate:  [57-99] 66 (04/10 0809) Resp:  [12-18] 18 (04/10 0809) BP: (99-125)/(52-65) 110/63 (04/10 0809) SpO2:  [94 %-97 %] 97 % (04/10 0809)     Height: 5\' 2"  (157.5 cm) Weight: 59 kg BMI (Calculated): 23.77   Intake/Output last 2 shifts:  04/09 0701 - 04/10 0700 In: 1001.5 [I.V.:801.5; IV Piggyback:200] Out: 30 [Drains:30]   Physical Exam:  Constitutional: alert, cooperative and no distress  Respiratory: breathing non-labored at rest  Cardiovascular: regular rate and sinus rhythm  Gastrointestinal: Soft, incisional soreness, non-distended, no rebound/guarding. Surgical drain in suprapubic incision; output somewhat darker serosanguinous fluid  Integumentary: Incisions CDI with staples; no erythema or drainage   Labs:     Latest Ref Rng & Units 01/03/2023    3:49 AM 01/02/2023    6:02 AM 11/06/2022    3:11 PM  CBC  WBC 4.0 - 10.5 K/uL 15.1  12.1  8.0   Hemoglobin 12.0 - 15.0 g/dL 16.9  67.8  93.8   Hematocrit 36.0 - 46.0 % 31.6  36.6  38.4   Platelets 150 - 400 K/uL 207  257  323       Latest Ref Rng & Units 01/03/2023    3:49 AM 01/02/2023    6:02 AM 11/06/2022    3:11 PM  CMP  Glucose 70 - 99 mg/dL 101  751  025   BUN 8 - 23 mg/dL 10  10  12    Creatinine  0.44 - 1.00 mg/dL 8.52  7.78  2.42   Sodium 135 - 145 mmol/L 127  131  136   Potassium 3.5 - 5.1 mmol/L 4.1  3.5  3.8   Chloride 98 - 111 mmol/L 97  101  101   CO2 22 - 32 mmol/L 24  25  26    Calcium 8.9 - 10.3 mg/dL 7.9  8.1  8.8   Total Protein 6.5 - 8.1 g/dL  6.3    Total Bilirubin 0.3 - 1.2 mg/dL  0.8    Alkaline Phos 38 - 126 U/L  33    AST 15 - 41 U/L  37    ALT 0 - 44 U/L  27       Imaging studies: No new pertinent imaging studies   Assessment/Plan:  72 y.o. female 1 Day Post-Op s/p hand assisted laparoscopic partial cecectomy for perforated appendicitis.   - Okay to advance diet as tolerated - Continue IV Abx; complete 10 days total (PO + IV) at discharge - Wean IVF - Continue surgical drain; monitor and record output; she will DC with this - Monitor abdominal examination; on-gong bowel function  - Pain control prn; antiemetics prn   - Morning labs   - Mobilize  - Discharge Planning: Patient wishes to stay one more night, which is reasonable given nature  of surgery. Plan to DC tomorrow AM (04/11) with drain, ABx   All of the above findings and recommendations were discussed with the patient, patient's family (husband at bedside), and the medical team, and all of patient's and family's questions were answered to their expressed satisfaction.  -- Lynden Oxford, PA-C  [facility] 01/03/2023, 8:32 AM M-F: 7am - 4pm

## 2023-01-04 ENCOUNTER — Other Ambulatory Visit: Payer: Self-pay | Admitting: Psychiatry

## 2023-01-04 ENCOUNTER — Inpatient Hospital Stay: Payer: Medicare Other

## 2023-01-04 DIAGNOSIS — F339 Major depressive disorder, recurrent, unspecified: Secondary | ICD-10-CM

## 2023-01-04 LAB — CBC
HCT: 28.6 % — ABNORMAL LOW (ref 36.0–46.0)
Hemoglobin: 9.6 g/dL — ABNORMAL LOW (ref 12.0–15.0)
MCH: 31.7 pg (ref 26.0–34.0)
MCHC: 33.6 g/dL (ref 30.0–36.0)
MCV: 94.4 fL (ref 80.0–100.0)
Platelets: 206 10*3/uL (ref 150–400)
RBC: 3.03 MIL/uL — ABNORMAL LOW (ref 3.87–5.11)
RDW: 12 % (ref 11.5–15.5)
WBC: 11 10*3/uL — ABNORMAL HIGH (ref 4.0–10.5)
nRBC: 0 % (ref 0.0–0.2)

## 2023-01-04 LAB — BASIC METABOLIC PANEL
Anion gap: 4 — ABNORMAL LOW (ref 5–15)
BUN: 12 mg/dL (ref 8–23)
CO2: 25 mmol/L (ref 22–32)
Calcium: 7.5 mg/dL — ABNORMAL LOW (ref 8.9–10.3)
Chloride: 93 mmol/L — ABNORMAL LOW (ref 98–111)
Creatinine, Ser: 0.61 mg/dL (ref 0.44–1.00)
GFR, Estimated: >60 mL/min (ref >60)
Glucose, Bld: 103 mg/dL — ABNORMAL HIGH (ref 70–99)
Potassium: 3.9 mmol/L (ref 3.5–5.1)
Sodium: 122 mmol/L — ABNORMAL LOW (ref 135–145)

## 2023-01-04 LAB — AEROBIC/ANAEROBIC CULTURE W GRAM STAIN (SURGICAL/DEEP WOUND)

## 2023-01-04 LAB — SURGICAL PATHOLOGY

## 2023-01-04 MED ORDER — SODIUM CHLORIDE 0.9 % IV SOLN: INTRAVENOUS | Status: DC

## 2023-01-04 MED ORDER — SODIUM CHLORIDE 0.9 % IV SOLN
12.5000 mg | INTRAVENOUS | Status: DC | PRN
  Administered 2023-01-04 (×2): 12.5 mg via INTRAVENOUS
  Filled 2023-01-04 (×2): qty 12.5

## 2023-01-04 MED ORDER — PANTOPRAZOLE SODIUM 40 MG PO TBEC
40.0000 mg | DELAYED_RELEASE_TABLET | Freq: Every day | ORAL | Status: DC
  Administered 2023-01-04 – 2023-01-06 (×3): 40 mg via ORAL
  Filled 2023-01-04: qty 1
  Filled 2023-01-04: qty 2
  Filled 2023-01-04: qty 1

## 2023-01-04 NOTE — Progress Notes (Signed)
PHARMACIST - PHYSICIAN COMMUNICATION  CONCERNING: IV to Oral Route Change Policy  RECOMMENDATION: This patient is receiving pantoprazole by the intravenous route.  Based on criteria approved by the Pharmacy and Therapeutics Committee, the intravenous medication(s) is/are being converted to the equivalent oral dose form(s).  DESCRIPTION: These criteria include: The patient is eating (either orally or via tube) and/or has been taking other orally administered medications for a least 24 hours The patient has no evidence of active gastrointestinal bleeding or impaired GI absorption (gastrectomy, short bowel, patient on TNA or NPO).  If you have questions about this conversion, please contact the Pharmacy Department   Tressie Ellis, [facility] 01/04/2023 10:32 AM

## 2023-01-04 NOTE — Progress Notes (Addendum)
[facility] SUR[facility]ICAL PROGRESS NOTE  Hospital Day(s): 1.   Post op day(s): 2 Days Post-Op.   Interval History:  Patient seen and examined Reports having a "rough night." She reports increased nausea, bloating, and burping Abdomen is sore but no worse No fever, chills, or emesis Leukocytosis continues to improve; now 11.0K Renal function remains normal; sCr - 0.61 Hyponatremia to 122 Surgical drain output not recorded She is on regular diet but not eating; no appetite Some flatus but minimal  Vital signs in last 24 hours: [min-max] current  Temp:  [98.1 F (36.7 C)-98.5 F (36.9 C)] 98.1 F (36.7 C) (04/11 0742) Pulse Rate:  [63-80] 65 (04/11 0742) Resp:  [16-18] 16 (04/11 0742) BP: (113-130)/(70-77) 115/70 (04/11 0742) SpO2:  [90 %-95 %] 90 % (04/11 0742)     Height: 5\' 2"  (157.5 cm) Weight: 59 kg BMI (Calculated): 23.77   Intake/Output last 2 shifts:  04/10 0701 - 04/11 0700 In: 600 [P.O.:600] Out: 200 [Urine:200]   Physical Exam:  Constitutional: alert, cooperative and no distress Respiratory: breathing non-labored at rest  Cardiovascular: regular rate and sinus rhythm  Gastrointestinal: Soft, incisional soreness, mild distension, no rebound/guarding. Surgical drain in suprapubic incision site; serosanguinous Integumentary: Laparoscopic incisions are CDI with staples; no drainage   Labs:     Latest Ref Rng & Units 01/04/2023    3:59 AM 01/03/2023    3:49 AM 01/02/2023    6:02 AM  CBC  WBC 4.0 - 10.5 K/uL 11.0  15.1  12.1   Hemoglobin 12.0 - 15.0 g/dL 9.6  41.9  37.9   Hematocrit 36.0 - 46.0 % 28.6  31.6  36.6   Platelets 150 - 400 K/uL 206  207  257       Latest Ref Rng & Units 01/04/2023    3:59 AM 01/03/2023    3:49 AM 01/02/2023    6:02 AM  CMP  Glucose 70 - 99 mg/dL 024  097  353   BUN 8 - 23 mg/dL 12  10  10    Creatinine 0.44 - 1.00 mg/dL 2.99  2.42  6.83   Sodium 135 - 145 mmol/L 122  127  131   Potassium 3.5 - 5.1 mmol/L 3.9  4.1  3.5    Chloride 98 - 111 mmol/L 93  97  101   CO2 22 - 32 mmol/L 25  24  25    Calcium 8.9 - 10.3 mg/dL 7.5  7.9  8.1   Total Protein 6.5 - 8.1 g/dL   6.3   Total Bilirubin 0.3 - 1.2 mg/dL   0.8   Alkaline Phos 38 - 126 U/L   33   AST 15 - 41 U/L   37   ALT 0 - 44 U/L   27      Imaging studies: No new pertinent imaging studies   Assessment/Plan:  72 y.o. female with likely developing ileus otherwise doing well 2 Days Post-Op s/p hand assisted laparoscopic partial cecectomy for perforated appendicitis.    - Will get KUB this morning given increased nausea and burping; suspect ileus  - Back down to CLD - Continue IV Abx; complete 10 days total (PO + IV) at discharge - Gentle IVF; 50 ml/hr NS - Continue surgical drain; monitor and record output; she will DC with this - Monitor abdominal examination; on-gong bowel function             - Pain control prn; antiemetics prn              -  Morning labs; CBC, BMP             - Mobilize - doing well    - Discharge Planning: Unfortunately no further progress, concern for possible ileus. Not ready for DC today   All of the above findings and recommendations were discussed with the patient, patient's family (husband at bedside), and the medical team, and all of patient's and family's questions were answered to their expressed satisfaction.   -- Lynden Oxford, PA-C [facility] 01/04/2023, 9:05 AM M-F: 7am - 4pm

## 2023-01-04 NOTE — Care Management Important Message (Signed)
Important Message  Patient Details  Name: Andrea [name] MRN: [id number] Date of Birth: July 19, 1951   Medicare Important Message Given:  N/A - LOS <3 / Initial given by admissions     Johnell Comings 01/04/2023, 1:56 PM

## 2023-01-05 ENCOUNTER — Inpatient Hospital Stay: Payer: Medicare Other

## 2023-01-05 LAB — COMPREHENSIVE METABOLIC PANEL
ALT: 23 U/L (ref 0–44)
AST: 24 U/L (ref 15–41)
Albumin: 2.7 g/dL — ABNORMAL LOW (ref 3.5–5.0)
Alkaline Phosphatase: 37 U/L — ABNORMAL LOW (ref 38–126)
Anion gap: 7 (ref 5–15)
BUN: 9 mg/dL (ref 8–23)
CO2: 25 mmol/L (ref 22–32)
Calcium: 8.1 mg/dL — ABNORMAL LOW (ref 8.9–10.3)
Chloride: 98 mmol/L (ref 98–111)
Creatinine, Ser: 0.68 mg/dL (ref 0.44–1.00)
GFR, Estimated: >60 mL/min (ref >60)
Glucose, Bld: 89 mg/dL (ref 70–99)
Potassium: 3.9 mmol/L (ref 3.5–5.1)
Sodium: 124 mmol/L — ABNORMAL LOW (ref 135–145)
Total Bilirubin: 0.5 mg/dL (ref 0.3–1.2)
Total Protein: 5.4 g/dL — ABNORMAL LOW (ref 6.5–8.1)

## 2023-01-05 LAB — CBC
HCT: 30.4 % — ABNORMAL LOW (ref 36.0–46.0)
Hemoglobin: 10.2 g/dL — ABNORMAL LOW (ref 12.0–15.0)
MCH: 31.7 pg (ref 26.0–34.0)
MCHC: 33.6 g/dL (ref 30.0–36.0)
MCV: 94.4 fL (ref 80.0–100.0)
Platelets: 239 10*3/uL (ref 150–400)
RBC: 3.22 MIL/uL — ABNORMAL LOW (ref 3.87–5.11)
RDW: 11.9 % (ref 11.5–15.5)
WBC: 7.4 10*3/uL (ref 4.0–10.5)
nRBC: 0 % (ref 0.0–0.2)

## 2023-01-05 LAB — PHOSPHORUS: Phosphorus: 3.6 mg/dL (ref 2.5–4.6)

## 2023-01-05 LAB — AEROBIC/ANAEROBIC CULTURE W GRAM STAIN (SURGICAL/DEEP WOUND): Gram Stain: NONE SEEN

## 2023-01-05 LAB — MAGNESIUM: Magnesium: 1.9 mg/dL (ref 1.7–2.4)

## 2023-01-05 MED ORDER — IOHEXOL 9 MG/ML PO SOLN
500.0000 mL | Freq: Once | ORAL | Status: AC | PRN
  Administered 2023-01-05: 500 mL via ORAL

## 2023-01-05 MED ORDER — PRAMIPEXOLE DIHYDROCHLORIDE 0.25 MG PO TABS
0.5000 mg | ORAL_TABLET | Freq: Two times a day (BID) | ORAL | Status: DC
  Administered 2023-01-05 – 2023-01-06 (×3): 0.5 mg via ORAL
  Filled 2023-01-05 (×3): qty 2

## 2023-01-05 MED ORDER — KCL IN DEXTROSE-NACL 20-5-0.9 MEQ/L-%-% IV SOLN
INTRAVENOUS | Status: DC
  Filled 2023-01-05 (×3): qty 1000

## 2023-01-05 MED ORDER — PRAMIPEXOLE DIHYDROCHLORIDE 0.25 MG PO TABS
0.7500 mg | ORAL_TABLET | Freq: Two times a day (BID) | ORAL | Status: DC
  Administered 2023-01-06 – 2023-01-07 (×3): 0.75 mg via ORAL
  Filled 2023-01-05 (×3): qty 3

## 2023-01-05 MED ORDER — IOHEXOL 300 MG/ML  SOLN
100.0000 mL | Freq: Once | INTRAMUSCULAR | Status: AC | PRN
  Administered 2023-01-05: 100 mL via INTRAVENOUS

## 2023-01-05 NOTE — Progress Notes (Signed)
[facility] SUR[facility]ICAL PROGRESS NOTE  Hospital Day(s): 2.   Post op day(s): 3 Days Post-Op.   Interval History:  Patient seen and examined Reports she is feeling better but had a tougher afternoon A few episodes of emesis yesterday; none this AM Nausea improving Abdomen is distended; sore No fever, chills Leukocytosis resolved; now 7.4K Hgb to 10.2; likely dilutional Renal function remains normal; sCr - 0.68 Hyponatremia to 124 Surgical drain output not recorded; serous She is on CLD; minimal appetite Passed a small amount of flatus Ambulating well   Vital signs in last 24 hours: [min-max] current  Temp:  [98.2 F (36.8 C)-98.4 F (36.9 C)] 98.3 F (36.8 C) (04/12 0725) Pulse Rate:  [64-74] 73 (04/12 0725) Resp:  [16-20] 16 (04/12 0725) BP: (128-140)/(75-76) 140/76 (04/12 0725) SpO2:  [88 %-94 %] 94 % (04/12 0725)     Height:  (157.5 cm) Weight: 59 kg BMI (Calculated): 23.77   Intake/Output last 2 shifts:  04/11 0701 - 04/12 0700 In: 2290.5 [P.O.:720; I.V.:728.8; IV Piggyback:841.7] Out: -    Physical Exam:  Constitutional: alert, cooperative and no distress Respiratory: breathing non-labored at rest  Cardiovascular: regular rate and sinus rhythm  Gastrointestinal: Soft, incisional soreness, mild distension but no gross tympany, no rebound/guarding. Surgical drain in suprapubic incision site; serosanguinous Integumentary: Laparoscopic incisions are CDI with staples; no drainage   Labs:     Latest Ref Rng & Units 01/05/2023    4:03 AM 01/04/2023    3:59 AM 01/03/2023    3:49 AM  CBC  WBC 4.0 - 10.5 K/uL 7.4  11.0  15.1   Hemoglobin 12.0 - 15.0 g/dL 11.9  9.6  14.7   Hematocrit 36.0 - 46.0 % 30.4  28.6  31.6   Platelets 150 - 400 K/uL 239  206  207       Latest Ref Rng & Units 01/05/2023    4:03 AM 01/04/2023    3:59 AM 01/03/2023    3:49 AM  CMP  Glucose 70 - 99 mg/dL 89  829  562   BUN 8 - 23 mg/dL Creatinine 0.44 - 1.00 mg/dL  1.30  8.65  7.84   Sodium 135 - 145 mmol/L 124  122  127   Potassium 3.5 - 5.1 mmol/L 3.9  3.9  4.1   Chloride 98 - 111 mmol/L 98  93  97   CO2 22 - 32 mmol/L Calcium 8.9 - 10.3 mg/dL 8.1  7.5  7.9   Total Protein 6.5 - 8.1 g/dL 5.4     Total Bilirubin 0.3 - 1.2 mg/dL 0.5     Alkaline Phos 38 - 126 U/L 37     AST 15 - 41 U/L 24     ALT 0 - 44 U/L 23        Imaging studies: No new pertinent imaging studies   Assessment/Plan:  72 y.o. female with likely developing mild ileus otherwise doing well 3 Days Post-Op s/p hand assisted laparoscopic partial cecectomy for perforated appendicitis.    - Continue CLD for now  - I do not think she warrants NGT placement right now. She, and daughter, understand that should she have return of N/V or worsening distension, we may place this for symptom control.  - Continue IV Abx; complete 10 days total (PO + IV) at discharge - Continue Gentle IVF; 50 ml/hr NS - Continue surgical drain; monitor  and record output. This is improved, anticipate can remove before DC - Will get morning KUB tomorrow (04/13) - Monitor abdominal examination; on-gong bowel function             - Pain control prn; antiemetics prn              - Morning labs; CBC, BMP             - Mobilize - doing well, ambulated with daughter this AM   - Discharge Planning: Still with ileus, not ready  All of the above findings and recommendations were discussed with the patient, patient's family (daughter at bedside), and the medical team, and all of patient's and family's questions were answered to their expressed satisfaction.   -- Lynden Oxford, PA-C [facility] 01/05/2023, 8:56 AM M-F: 7am - 4pm

## 2023-01-05 NOTE — TOC CM/SW Note (Signed)
  Transition of Care [facility]) Screening Note   Patient Details  Name: Andrea [name] Date of Birth: October 23, 1950   [facility] of [facility]) CM/SW Contact:    Margarito Liner, LCSW Phone Number: 01/05/2023, 11:48 AM    Transition of Care Department [facility]) has reviewed patient and no TOC needs have been identified at this time. We will continue to monitor patient advancement through interdisciplinary progression rounds. If new patient transition needs arise, please place a TOC consult.

## 2023-01-05 NOTE — Telephone Encounter (Signed)
Dosage changed

## 2023-01-06 ENCOUNTER — Inpatient Hospital Stay: Payer: Medicare Other

## 2023-01-06 LAB — AEROBIC/ANAEROBIC CULTURE W GRAM STAIN (SURGICAL/DEEP WOUND)

## 2023-01-06 MED ORDER — AMOXICILLIN-POT CLAVULANATE 875-125 MG PO TABS
1.0000 | ORAL_TABLET | Freq: Two times a day (BID) | ORAL | Status: DC
  Administered 2023-01-06 – 2023-01-07 (×3): 1 via ORAL
  Filled 2023-01-06 (×3): qty 1

## 2023-01-06 NOTE — Progress Notes (Signed)
Mobility Specialist - Progress Note    01/06/23 1628  Mobility  Activity Ambulated independently in hallway;Stood at bedside;Dangled on edge of bed  Level of Assistance Independent  Assistive Device None  Distance Ambulated (ft) 960 ft  Range of Motion/Exercises Active  Activity Response Tolerated well  Mobility Referral Yes  $Mobility charge 1 Mobility   Pt OOB upon entry. Pt ambulates around NS indep with no AD. Pt returned to bed and left with needs in reach.   Johnathan Hausen Mobility Specialist 01/06/23, 4:31 PM

## 2023-01-06 NOTE — Progress Notes (Signed)
POD # 4 CT pers reviewed no abscess or complications She feels better  NAD Abd: soft, staples in place, be serous drainage.  No peritonitis.  No infection  A/p  Resolving ileus advance diet Change a/bs to po DC in am if continues to improve

## 2023-01-07 LAB — BASIC METABOLIC PANEL
Anion gap: 5 (ref 5–15)
BUN: 5 mg/dL — ABNORMAL LOW (ref 8–23)
CO2: 25 mmol/L (ref 22–32)
Calcium: 8.1 mg/dL — ABNORMAL LOW (ref 8.9–10.3)
Chloride: 102 mmol/L (ref 98–111)
Creatinine, Ser: 0.54 mg/dL (ref 0.44–1.00)
GFR, Estimated: >60 mL/min (ref >60)
Glucose, Bld: 97 mg/dL (ref 70–99)
Potassium: 3.6 mmol/L (ref 3.5–5.1)
Sodium: 132 mmol/L — ABNORMAL LOW (ref 135–145)

## 2023-01-07 MED ORDER — OXYCODONE HCL 5 MG PO TABS
5.0000 mg | ORAL_TABLET | ORAL | 0 refills | Status: DC | PRN
Start: 2023-01-07 — End: 2023-01-16

## 2023-01-07 MED ORDER — AMOXICILLIN-POT CLAVULANATE 875-125 MG PO TABS: 1.0000 | ORAL_TABLET | Freq: Two times a day (BID) | ORAL | 0 refills | Status: DC

## 2023-01-07 NOTE — Discharge Summary (Signed)
Patient ID: Andrea [name] MRN: [id number] DOB/AGE: 70-Jan-1952 70 y.o.  Admit date: 01/02/2023 Discharge date: 01/07/2023   Discharge Diagnoses:  Principal Problem:   Acute appendicitis Active Problems:   Appendicitis   Procedures: Hand assisted laparoscopic cecectomy  Hospital Course:  72 yo female admitted with findings consistent with acute appendicitis and  was taken promptly to the operating room for an uneventful laparoscopic partial cecectomy , due to perforation case converted to hand assisted. .  Patient was kept for three nights due to pain as well as nausea and ileus.We were able to support her and advance her diet slowly. We did repeat CT to r/o any complication or abscess, showing no evidence of such. At  The time of discharge the patient was ambulating,  pain was controlled.  Her vital signs were stable and she was afebrile.   physical exam at discharge showed a pt  in no acute distress.  Awake and alert.  Abdomen: Soft incisions healing well without infection or peritonitis.  Extremities well-perfused and no edema.  Condition of the patient the time of discharge was stable    Disposition: Discharge disposition: 01-Home or Self Care       Discharge Instructions     Call MD for:  difficulty breathing, headache or visual disturbances   Complete by: As directed    Call MD for:  extreme fatigue   Complete by: As directed    Call MD for:  hives   Complete by: As directed    Call MD for:  persistant dizziness or light-headedness   Complete by: As directed    Call MD for:  persistant nausea and vomiting   Complete by: As directed    Call MD for:  redness, tenderness, or signs of infection (pain, swelling, redness, odor or green/yellow discharge around incision site)   Complete by: As directed    Call MD for:  severe uncontrolled pain   Complete by: As directed    Call MD for:  temperature >100.4   Complete by: As directed    Diet - low sodium heart healthy   Complete  by: As directed    Discharge instructions   Complete by: As directed    Shower daily   Increase activity slowly   Complete by: As directed    Lifting restrictions   Complete by: As directed    20 lbs x 5 wks      Allergies as of 01/07/2023       Reactions   Hydrocodone-acetaminophen Other (See Comments)   Passes out   Morphine Other (See Comments)   Passes out        Medication List     TAKE these medications    ALPRAZolam 1 MG tablet Commonly known as: XANAX 1/2 tablet 3 times daily as needed for anxiety and 1 tablet at night What changed: additional instructions   amoxicillin-clavulanate 875-125 MG tablet Commonly known as: AUGMENTIN Take 1 tablet by mouth every 12 (twelve) hours.   atorvastatin 20 MG tablet Commonly known as: LIPITOR Take 20 mg by mouth.   calcium citrate-vitamin D 315-200 MG-UNIT tablet Commonly known as: CITRACAL+D Take 1 tablet by mouth daily.   escitalopram 20 MG tablet Commonly known as: LEXAPRO TAKE 1 TABLET BY MOUTH EVERY DAY   fluticasone 50 MCG/ACT nasal spray Commonly known as: FLONASE fluticasone propionate 50 mcg/actuation nasal spray,suspension   levothyroxine 50 MCG tablet Commonly known as: SYNTHROID Take 50 mcg by mouth daily before breakfast.  oxyCODONE 5 MG immediate release tablet Commonly known as: Oxy IR/ROXICODONE Take 1 tablet (5 mg total) by mouth every 4 (four) hours as needed for severe pain.   pramipexole 0.5 MG tablet Commonly known as: MIRAPEX 1 and 1/2 tablets in the AM and 6 PM and 1 tablet at noon and bedtime   raloxifene 60 MG tablet Commonly known as: EVISTA Take 60 mg by mouth daily.   Xiidra 5 % Soln Generic drug: Lifitegrast Place 1 drop into both eyes daily.        Follow-up Information     Donovan Kail, PA-C. Schedule an appointment as soon as possible for a visit on 01/16/2023.   Specialty: Physician Assistant Why: s/p laparoscopic appendectomy; with drain Contact  information: [address] [place] [address] [phone]                  Sterling Big, MD FACS

## 2023-01-08 ENCOUNTER — Telehealth: Payer: Self-pay | Admitting: Psychiatry

## 2023-01-08 ENCOUNTER — Telehealth: Payer: Self-pay | Admitting: *Deleted

## 2023-01-08 NOTE — Telephone Encounter (Signed)
Patient also asking for RF on alprazolam. Has a RF available at the pharmacy. Patient notified.

## 2023-01-08 NOTE — Telephone Encounter (Signed)
Patient was in the hospital for a ruptured appendix. She said she did not receive her psych medications and was asking how to take them now. From what I can see it looks like she was given the pramipexole and Lexapro.

## 2023-01-08 NOTE — Telephone Encounter (Signed)
Can restart Lexapro at 1/2 tablet daily for 3 days then 1 daily. Restart 1/2 pramipexole tablet 3 times daily for 3 days then 1 tablet 3 times daily for 3 days then 1 and 1/2 tablet 3 times daily.

## 2023-01-08 NOTE — Telephone Encounter (Signed)
Pt LVM @ 9:41a.  She has been in the hospital, but is out now.  She wasn't able to take her meds as prescribed while she was there.  She wants Andrea [name] to call her back to tell how to get started back on her meds.  Next appt 4/19

## 2023-01-08 NOTE — Telephone Encounter (Signed)
Called patient with instructions, also sent MyChart message to make it easier to follow instructions.

## 2023-01-08 NOTE — Transitions of Care (Post Inpatient/ED Visit) (Signed)
   01/08/2023  Name: Andrea [name] MRN: [id number] DOB: January 02, 1951  Today's TOC FU Call Status: Today's TOC FU Call Status:: Successful TOC FU Call Competed TOC FU Call Complete Date: 01/08/23  Transition Care Management Follow-up Telephone Call Date of Discharge: 01/07/23 Discharge Facility: [facility]) Type of Discharge: Inpatient Admission Primary Inpatient Discharge Diagnosis:: acute appendicitis How have you been since you were released from the hospital?: Better Any questions or concerns?: No  Items Reviewed: Did you receive and understand the discharge instructions provided?: Yes Medications obtained and verified?: Yes (Medications Reviewed) Any new allergies since your discharge?: No Dietary orders reviewed?: No Do you have support at home?: Yes People in Home: alone Name of Support/Comfort Primary Source: [facility] Care and Equipment/Supplies: Were Home Health Services Ordered?: No Any new equipment or medical supplies ordered?: No  Functional Questionnaire: Do you need assistance with bathing/showering or dressing?: No Do you need assistance with meal preparation?: No Do you need assistance with eating?: No Do you have difficulty maintaining continence: No Do you need assistance with getting out of bed/getting out of a chair/moving?: No Do you have difficulty managing or taking your medications?: No  Follow up appointments reviewed: PCP Follow-up appointment confirmed?: NA Specialist Hospital Follow-up appointment confirmed?: No Reason Specialist Follow-Up Not Confirmed: Patient has Specialist Provider Number and will Call for Appointment Do you need transportation to your follow-up appointment?: No Do you understand care options if your condition(s) worsen?: Yes-patient verbalized understanding  SDOH Interventions Today    Flowsheet Row Most Recent Value  SDOH Interventions   Food Insecurity Interventions Intervention Not Indicated   Housing Interventions Intervention Not Indicated  Transportation Interventions Intervention Not Indicated      Interventions Today    Flowsheet Row Most Recent Value  General Interventions   General Interventions Discussed/Reviewed General Interventions Discussed, General Interventions Reviewed, Doctor Visits  Doctor Visits Discussed/Reviewed Specialist      [facility] Interventions Today    Flowsheet Row Most Recent Value  TOC Interventions   TOC Interventions Discussed/Reviewed TOC Interventions Discussed, TOC Interventions Reviewed       Gean Maidens BSN RN [facility] [phone]

## 2023-01-11 ENCOUNTER — Ambulatory Visit: Payer: Medicare Other | Admitting: Psychiatry

## 2023-01-12 ENCOUNTER — Ambulatory Visit (INDEPENDENT_AMBULATORY_CARE_PROVIDER_SITE_OTHER): Payer: Medicare Other | Admitting: Psychiatry

## 2023-01-12 ENCOUNTER — Encounter: Payer: Self-pay | Admitting: Psychiatry

## 2023-01-12 DIAGNOSIS — F339 Major depressive disorder, recurrent, unspecified: Secondary | ICD-10-CM

## 2023-01-12 DIAGNOSIS — F5105 Insomnia due to other mental disorder: Secondary | ICD-10-CM | POA: Diagnosis not present

## 2023-01-12 DIAGNOSIS — F411 Generalized anxiety disorder: Secondary | ICD-10-CM

## 2023-01-12 NOTE — Progress Notes (Signed)
Andrea [name] [id number] [birth year] 72 y.o.  Subjective:   Patient ID:  Andrea [name] is a 72 y.o. (DOB 08/14/1951) female.  Chief Complaint:  Chief Complaint  Patient presents with   Follow-up   Depression   Anxiety    HPI Andrea [name] presents to the office today for follow-up of MDE and anxiety disorder.  seen 09/2019.  No meds were changed.  Been on Lexapro 20 since early 2017.  02/27/2020 phone call from patient: Pt experiencing more anxiety than she normally has. Pt would like to know what are some options that she has as far as her medication. Pt will be in an appt  MD response: If the anxiety is just occasional then using alprazolam as needed would be an okay thing to do.  If it is been fairly persistent for a couple of weeks or more than the simplest solution would be to increase the Lexapro to 1-1/2 tablets daily.  Historically she has done well on Lexapro 20 mg daily for an extended period of time.  If she is under temporary stress then once that is resolved we could drop it back to 1 daily.  If this does not work as expected then she should schedule an earlier appointment. Pt response: Patient called back and she's been having temporary stress, but it's not bad. Things are great, she's working out and feeling okay for the most part. It's been going on for a bit so she agrees to the increase Lexapro 20 mg 1.5 tablets daily. She has been taking alprazolam at hs occasionally to help sleep better and it does help.   12/06/2020 appointment with the following noted:  No covid.  H Covid and recovered.   Had increased Lexapro to 30 mg for 90 days and saw a difference but started seeing weight gain and was doing oK and able to drop back and been OK. Still renovating a house for a year.  Sold her house and mourns the house. Calmed down.  Kept 3 gkids and dogs 10 days.   [place] for new house May 1. Rare alprazolam for HS. Stress moving to condo and would have crying spell over the move  and some problems she was running into dealing with it.  Hard with change.  But taking time to adjust to the idea of moving from her house of 23 years.  Initially refused but he left it up to her and she's come to peace about it.  Andrea [name].   Good response to medication and no SE.  Exercising is good medicine.  If doesn't then doesn't feel as good. Satisfied. Plan: no med changes  08/01/21  appt noted:  seen with Andrea [name] died 2023-08-12.  A lot of ups and downs per Andrea.  Hard dips. Got worse when retired and dealing with the new  house.  Had trouble getting rid of things from the old house causing regrets over sellling the house.  Andrea notes doesn't do well with instability  and stressors.  Ruminates on past mistakes. Blamed Andrea [name] for the  difficulty renovating the house.  Neg self talk and beats herself up. When not enough sleep will get depressed.  Does better if busy.   Dreads things.  Patient denies difficulty with sleep initiation or maintenance. Denies appetite disturbance.  Patient reports that energy and motivation have been good. Patient denies any difficulty with concentration.  Patient denies any suicidal ideation.  Plan: Abilify 2.5 mg daily  for a week, then 5 mg daily. Continue Lexapro 20 mg daily  08/31/2021 appt noted: It worked immediately.  Insomnia with EMA. Normally 9 hours.  It's like I'm high.  Not tired.  Average 3-4 hours. Not depressed and no crazy negative thoughts.  Good response.  Energetic but up in middle of night. Plan: Okay to stop Abilify because of insomnia.   09/29/2021 appointment with the following noted: Parties were great.  Sleep problem resolved.  A lot of rest at the beach. Maybe the last week or 2 less motivation and socialization.  Not laying in the bed.  Just not as good as she was.  Not as outgoing as normal. No alcohol now.  Only had 1-2 at night.  Not ruminating.   Willing to restart Abilify at lower dose 2 mg daily. 3 grandsons  Plan: Relapse  depression recurs she can resume Abilify  but lower dose 2 mg daily or every other day to try to avoid the insomnia where she can contact our office and we can discussed the possibility of an alternative antidepressant such as Trintellix or duloxetine.  12/28/2021 appointment noted: Several phone calls since she was here.  Abilify plus Lexapro failed.  Switch to duloxetine 90 mg which she started 12/02/2018 2023 Started lamotrigine. Also started Rexulti Sister has had cycling depression that has responded well to lamotrigine with poor response to SSRIs [name] with a little dementia. SE tremor 90 mg duloxetine, ? Wt gain over a few weeks.. Exercise and wt control Social isolation, low self esteem, worry about the way she looks and not usually that way. Crying better with duloxetine.  More dependent on H.   Anhedonia.  Black hole.  Never this low. No SI Having to use Xanax.   Asks about day treatment and [facility] Plan: rec TMS Continue lamotrigine as prescribed Increase Rexulti to 1 mg daily Switch to Trintellix:  reduce duloxetine to 2 of the 30 mg capsules and start Trintellix 5 mg daily for 5 days,  Then Increase Trintellix to 10 mg daily and reduce duloxetine to 1 capsule for 1 week. Then stop duloxetine.  02/15/22 appt noted: On Trintellix , Rexulti 1 and lamotrigine I feel good.  Walks 3 miles daily is great for mental health. Andrea [name] would not be covered by [facility]. Started feeling better after a week or 2 after the last visit. SE appetite increased. No nausea. Not crying anymore. Sleeping well and no longer ruminates. Plan: Continue Trintellix but DC Rexulti due to weight gain.  Continue lamotrigine  03/06/2022 appointment with the following noted: Last 2 weeks has been very sad and nervous. Trintellix was increased to 20 mg daily  03/10/2022 complaining of ongoing depression, feeling jittery, negative thinking, early morning awakening, ruminating about past decisions.  Wanting to consider [place]  because she is desperate for improvement. Plan we will schedule urgent appointment  03/15/22 urgent appt noted:  seen with Andrea [name] Very depressed, worst ever.  Don't want to be alone, ongoing worry, ruminating on past decisions.  Racing negative thoughts.  No motivation.  Trouble staying asleep.  Getting 4 hours and used to 9 hours. Dread getting up. Using Xanax Increased Trintellix to 20 mg 9 days ago. H agrees sleep is critical. Isolating.   Need to be better by July 11. Plan: Clonazepam 1 mg HS. Continue trintellix 20 mg daily she is only been on this dose 9 days and it needs more time. Re lamotrigine and increase to 150 mg daily.Marland Kitchen  Vraylar 1.5 mg every other day Option TMS  03/24/22 appt noted: So much better.  Thinking straighter and not negative and not sad.  Sleeping better 8-9 hours.. No crying. H notices progressively better every day.  Mind clarity is much better.   No SE noted.  No hangover.  No nausea. Found a therapist, Andrea [name].   Feels well enough to go to [place] and kind of excited.  Back July 16.   Plan: Clonazepam 1 mg HS. This resolved insomnia without SE Continue trintellix 20 mg daily she is only been on this dose 20 days and it needs more time. Re lamotriginecontinue 150 mg daily.Arman Filter 1.5 mg every other day Option [place]  04/12/22 appt noted: Great.  Went to [place].  Was herself and enjoyed it.  Anxiety was managed.   [facility] and liked her. 3# wt gain.   Patient reports stable mood and denies depressed or irritable moods.  Patient denies any recent difficulty with anxiety.  Patient denies difficulty with sleep initiation or maintenance. Denies appetite disturbance.  Patient reports that energy and motivation have been good.  Patient denies any difficulty with concentration.  Patient denies any suicidal ideation. Sleeping well than not anxious. Plan: Trintellix 20  07/10/22 TC depression returned recently.  08/15/22 appt  noted: Still blunted  and diminished motivation.  Some days better than others.  Going to gym.  Partially better.   Still has a lot of anxiety and doesn't want to be alone. Functioning but not normal.  Dreads parties.   SE tremor for a couple of week.sleep great.  Dep 7/10. Current Vraylar 1.5 mg daily, increase lamotrigine 200 mg daily, Trintellix 20 , clonazepam 0.5 mg HS Plan: Reduce Clonazepam 1/2  mg HS. This resolved insomnia without SE Continue trintellix 20 mg daily only mildly effective. lamotrigine continue 200 mg daily.Andrea [name] DT lost response Auvelity Stop Vraylar After Thanksgiving stop Trintellix Wait 3 days then [employer] 1 in the morning for 1 week,  and if no side effects then increase Auvelity to 1 in the AM and 1 in the PM  09/04/22 TC: Complaining of persistent depression and wanting to do something else to help.  Added lithium CR 300 mg nightly  09/11/2022 phone call complaining of no improvement with Auvelity and lithium.  Appointment was moved up.  09/13/22 appt noted: Nothing changed. No better and no worse.  Awakens sad and tearful.  Sleeps well. Pushing herself to the gym.  Has a trainer. On clonazepam 0.5 mg HS She remains anxious when alone for no apparent reason.  Feelings of inferiority.  She is normally extroverted but now she is wanting to isolate from people.  All tasks seem monumental.  No enjoyment and not looking forward to anything.  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  She remains generally worried and anxious which is not typical for her. Plan: Clonazepam 1/2  mg HS. This resolved insomnia without SE Stop Auvelity Start nortriptyline 1 of the 25 mg capsules at night for 4 nights then 2 at night for 4 nights then 3 at night, then wait 1 week and get the blood test in the morning at [facility] Reduce lamotrigine to 1 and 1/2 tablets daily  Lamotrigine has not been helpful to him 100 mg daily.  Start reducing lamotrigine 150 mg daily and will continue to  taper at next appointment.  09/27/21 urgent appt :  she wanted urgent appt bc desperate to  feel better.  Seen with H Called at least twice since here for the same reasons of depression. Not sleeping well even with clonazepam 1 mg HS. Doesn't want to be alone. [place].   On lithium 300 mg daily, nortriptyline 75 mg HS.   Doesn't want to live like this but not acutely suicidal. Tolerating meds. Plan: pursue [place] protocol [facility]MS at [facility] bc faster optin than alternatives  10/30/22 TC: finished 50 TMS tx in the week and wants appt ASAP bc still struggling with crying and depresssion..  11/03/22 urgent appt : Completed TMS last week. Tue-Thur better days and then Friday nose-dived. Not done well since got back.   H notes high anxiety and crying in AM and PM and doesn't want to be alone.  Xanxax seems to help. Xanax helps and taking 0.5 mg AM and 1 mg HS H notes memory is sharper and she's more alert after treatment. Very anxioius all the time.  Get up sad.  Exercising. Sleep good with Xanax 1 mg HS. Plan: Increase Lexapro 20 mg dailly (should help crying and maybe anxiety but unlikely to resolve depression) Increase alprazolam to 0.5 mg TID and 1 mg HS for severe anxiety. H notes it helps. Pramipexole 0.25 BID for 5 days and if NR then 0.5 mg Bid off label. Reduce lamotrigine 100 mg for 2 weeks then 50 mg daily for 2 weeks then stop it. She wants to pursue Spravato if pramipexole fails  11/17/22 emergency work in appt: Pramipexole was not sent in and MD not aware until yesterday.  She is not any better and is desperate for a med change. Sleeping well and taking Xanax 1 mg HS Mornings are worse.  Not crying much.  Last Friday was a bad day and she didn't want to be alone.  But did let her H play golf once this week. Reduced lamotrigine today to 50 BID and weaning off. H says better this week than last week. Increased Lexapro to 20 mg daily. Plan: Increased Lexapro 20 mg dailly (helped crying  and maybe anxiety but unlikely to resolve depression) Continue alprazolam to 0.5 mg TID prn anxiety and 1 mg HS. H notes it helps. Pramipexole 0.25 mg BID for 3 days then 0.5 mg BID Reduce lamotrigine 50 mg daily for 2 weeks then stop it. She wants to pursue Spravato if pramipexole fails  12/25/22 appt urgently. COMPLETED 1 WEEK TMS [facility] without benefit except TMS took away brain fog.   She feels need to take Xanax 0.5 mg TID  Some sleepiness with Xanax . Mornings are better and fine while at the gym but when home gets depressed again.   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 Needs Xanax to do social things. Plan: Increased Lexapro 20 mg dailly (helped crying and maybe anxiety but unlikely to resolve depression) Continue alprazolam to 0.5 mg TID prn anxiety and 1 mg HS. H notes it helps. Change pramipexole 0.5mg  tablets, 1 tablet four times daily for 1 week,  Then if not better 1and 1/2 tablets in the AM and dinner and 1 tablet at lunch and dinner.    4/9-4/15 hosp for ruptured appendix and missed meds: Can restart Lexapro at 1/2 tablet daily for 3 days then 1 daily. Restart 1/2 pramipexole tablet 3 times daily for 3 days then 1 tablet 3 times daily for 3 days then 1 and 1/2 tablet 3 times daily.      01/12/23 appt noted: No GI sx now.  No appetite but  working on protein.   I think I'm doing real well mentally.  Has resumed meds.  Mood has been great. Pramipexole 0.5mg   1 and 1/2 TID Lexapro 20 Xanax reduced to 1 mg HS H sees a difference.   No SE with meds.     ECT-MADRS    Flowsheet Row Office Visit from 11/03/2022 in [facility]  MADRS Total Score 40      PHQ2-9    Flowsheet Row Clinical Support from 02/24/2022 in [facility] Video Visit from 06/14/2021 in [facility] [place]  PHQ-2 Total Score 0 0  PHQ-9 Total Score -- 0      Flowsheet Row ED to Hosp-Admission (Discharged) from  01/02/2023 in [facility] from 12/03/2022 in [facility] at [facility]  ED from 11/06/2022 in [facility] Emergency Department at [facility]  C-SSRS RISK CATEGORY No Risk No Risk No Risk      Past Psychiatric Medication Trials: Sertraline, fluoxetine, citalopram 50, nefazodone, Lexapro 30 weight gain,  Viibryd 30, Wellbutrin 300, Duloxetine 90 SE tremor, mirtazapine,  Trintellix 20 little response Auvelity twice daily for 3 weeks no response Nortriptyline 75 level 86 NR Auvelity BID  3 weeks NR  Lamotrigine 200 no response Abilify, Rexulti 0.5 Vraylar 1.5 daily tremor and lost response Lithium 300 shakes  methyl folate buspirone,  alprazolam, clonazepam, Under the care of [facility] since January 2001  Sister does well on lamotrigine.  Review of Systems:  Review of Systems  Constitutional:  Positive for fatigue. Negative for appetite change.  Cardiovascular:  Negative for palpitations.  Gastrointestinal:  Negative for nausea.  Neurological:  Negative for dizziness and light-headedness.  Psychiatric/Behavioral:  Negative for behavioral problems, confusion, decreased concentration, dysphoric mood, hallucinations, self-injury, sleep disturbance and suicidal ideas. The patient is nervous/anxious. The patient is not hyperactive.     Medications: I have reviewed the patient's current medications.  Current Outpatient Medications  Medication Sig Dispense Refill   ALPRAZolam (XANAX) 1 MG tablet 1/2 tablet 3 times daily as needed for anxiety and 1 tablet at night (Patient taking differently: Taking 1/2 tablet twice daily and 1 and 1/2 tablets at night) 75 tablet 1   amoxicillin-clavulanate (AUGMENTIN) 875-125 MG tablet Take 1 tablet by mouth every 12 (twelve) hours. 10 tablet 0   atorvastatin (LIPITOR) 20 MG tablet Take 20 mg by mouth.      calcium citrate-vitamin Andrea (CITRACAL+Andrea) 315-200 MG-UNIT tablet Take 1  tablet by mouth daily.     escitalopram (LEXAPRO) 20 MG tablet TAKE 1 TABLET BY MOUTH EVERY DAY 90 tablet 0   fluticasone (FLONASE) 50 MCG/ACT nasal spray fluticasone propionate 50 mcg/actuation nasal spray,suspension     levothyroxine (SYNTHROID, LEVOTHROID) 50 MCG tablet Take 50 mcg by mouth daily before breakfast.     oxyCODONE (OXY IR/ROXICODONE) 5 MG immediate release tablet Take 1 tablet (5 mg total) by mouth every 4 (four) hours as needed for severe pain. 15 tablet 0   pramipexole (MIRAPEX) 0.5 MG tablet 1 and 1/2 tablets in the AM and 6 PM and 1 tablet at noon and bedtime 180 tablet 0   raloxifene (EVISTA) 60 MG tablet Take 60 mg by mouth daily.     XIIDRA 5 % SOLN Place 1 drop into both eyes daily.     No current facility-administered medications for this visit.    Medication Side Effects: None  Allergies:  Allergies  Allergen Reactions   Hydrocodone-Acetaminophen Other (See Comments)    Passes out   Morphine Other (See Comments)    Passes out    Past Medical History:  Diagnosis Date   GAD (generalized anxiety disorder)    Hyperlipidemia    Hypothyroidism    Osteopenia    Sinusitis     Family History  Problem Relation Age of Onset   Arthritis Mother    COPD Mother    Hypercholesterolemia Mother    Dementia Mother    Stroke Mother    Prostate cancer Brother    Cancer Maternal Grandmother        Oral    Social History   Socioeconomic History   Marital status: Married    Spouse name: Not on file   Number of children: Not on file   Years of education: Not on file   Highest education level: Not on file  Occupational History   Not on file  Tobacco Use   Smoking status: Never   Smokeless tobacco: Never  Vaping Use   Vaping Use: Never used  Substance and Sexual Activity   Alcohol use: Not Currently    Comment: social   Drug use: Never   Sexual activity: Not on file  Other Topics Concern   Not on file  Social History Narrative   Married.   1 child.  2 grandchildren.   Retired Once worked as a Psychologist, sport and exercise.   Enjoys exercising, spending time with family   Social Determinants of Health   Financial Resource Strain: Low Risk  (02/24/2022)   Overall Financial Resource Strain (CARDIA)    Difficulty of Paying Living Expenses: Not hard at all  Food Insecurity: No Food Insecurity (01/08/2023)   Hunger Vital Sign    Worried About Running Out of Food in the Last Year: Never true    Ran Out of Food in the Last Year: Never true  Transportation Needs: No Transportation Needs (01/08/2023)   PRAPARE - Administrator, Civil Service (Medical): No    Lack of Transportation (Non-Medical): No  Physical Activity: Sufficiently Active (02/24/2022)   Exercise Vital Sign    Days of Exercise per Week: 5 days    Minutes of Exercise per Session: 90 min  Stress: No Stress Concern Present (02/24/2022)   Andrea [name] of Occupational Health - Occupational Stress Questionnaire    Feeling of Stress : Not at all  Social Connections: Not on file  Intimate Partner Violence: Not At Risk (01/02/2023)   Humiliation, Afraid, Rape, and Kick questionnaire    Fear of Current or Ex-Partner: No    Emotionally Abused: No    Physically Abused: No    Sexually Abused: No    Past Medical History, Surgical history, Social history, and Family history were reviewed and updated as appropriate.   3 grandsons.  Please see review of systems for further details on the patient's review from today.   Objective:   Physical Exam:  There were no vitals taken for this visit.  Physical Exam Constitutional:      General: She is not in acute distress.    Appearance: She is well-developed.  Musculoskeletal:        General: No deformity.  Neurological:     Mental Status: She is alert and oriented to person, place, and time.     Motor: No tremor.     Coordination: Coordination normal.     Gait: Gait normal.  Psychiatric:  Attention and Perception: She is  attentive. She does not perceive auditory hallucinations.        Mood and Affect: Mood is anxious. Mood is not depressed. Affect is not labile, blunt, tearful or inappropriate.        Speech: Speech normal.        Behavior: Behavior normal. Behavior is not slowed.        Thought Content: Thought content normal. Thought content is not delusional. Thought content does not include homicidal or suicidal ideation. Thought content does not include suicidal plan.        Cognition and Memory: Cognition and memory normal.        Judgment: Judgment normal.     Comments: Insight intact. No auditory or visual hallucinations. No delusions.  Persistent severe depression and anxiety but perhaps a bit better than last week     Lab Review:     Component Value Date/Time   NA 132 (L) 01/07/2023 0518   K 3.6 01/07/2023 0518   CL 102 01/07/2023 0518   CO2 25 01/07/2023 0518   GLUCOSE 97 01/07/2023 0518   BUN 5 (L) 01/07/2023 0518   CREATININE 0.54 01/07/2023 0518   CALCIUM 8.1 (L) 01/07/2023 0518   PROT 5.4 (L) 01/05/2023 0403   ALBUMIN 2.7 (L) 01/05/2023 0403   AST 24 01/05/2023 0403   ALT 23 01/05/2023 0403   ALKPHOS 37 (L) 01/05/2023 0403   BILITOT 0.5 01/05/2023 0403   GFRNONAA >60 01/07/2023 0518       Component Value Date/Time   WBC 7.4 01/05/2023 0403   RBC 3.22 (L) 01/05/2023 0403   HGB 10.2 (L) 01/05/2023 0403   HCT 30.4 (L) 01/05/2023 0403   PLT 239 01/05/2023 0403   MCV 94.4 01/05/2023 0403   MCH 31.7 01/05/2023 0403   MCHC 33.6 01/05/2023 0403   RDW 11.9 01/05/2023 0403   LYMPHSABS 1.6 01/02/2023 0602   MONOABS 0.8 01/02/2023 0602   EOSABS 0.1 01/02/2023 0602   BASOSABS 0.0 01/02/2023 0602    No results found for: "POCLITH", "LITHIUM"   No results found for: "PHENYTOIN", "PHENOBARB", "VALPROATE", "CBMZ"   .res Assessment: Plan:    Recurrent major depression resistant to treatment  Generalized anxiety disorder  Insomnia due to mental condition   30-minute non face  to face time with patient . Recent worsening mood as noted before.  Seen emergently with pt after failed TMS and then hosp with ruptured appendix.   She has failed multiple meds as noted above including all the usual categories of antidepressants with the exception of  MAO inhibitors.  Or pramipexole. Consider Latuda, Seroquel (not likely to be tolerated DT wt), Olanzapine Fluox Combo.  Fortuner only she has had a dramatically positive response to pramipexole 0.5 mg tablets, 1-1/2 tabs 3 times daily added to Lexapro 20 mg and she is out of depression.  She is much less anxious and has been able to reduce the alprazolam just to taking it at night.  She has become much more functional again closer to her baseline.  She has recovered from her appendectomy for the most part.  We discussed the short-term risks associated with benzodiazepines including sedation and increased fall risk among others.  Discussed long-term side effect risk including dependence, potential withdrawal symptoms, and the potential eventual dose-related risk of dementia.  But recent studies from 2020 dispute this association between benzodiazepines and dementia risk. Newer studies in 2020 do not support an association with dementia.  Consider [employer]  Continue Lexapro 20 mg dailly (helped crying and maybe anxiety but unlikely to resolve depression) Continue alprazolam  1 mg HS. H notes it helps. Change pramipexole 0.5mg  tablets, 1 & 1/2 tablet  times daily  Disc off label use pramipexole and the dosage range 1-5 mg daily and risk higher dose including manic type sx, impulsivity.  Rec counseling .  She has a scheduled appt.  FU 2 weeks  Meredith Staggers, MD, DFAPA  Please see After Visit Summary for patient specific instructions.  Meredith Staggers, MD, DFAPA   Future Appointments  Date Time Provider Department Center  01/16/2023  3:00 PM Donovan Kail, [place] [facility] None  01/25/2023  1:30 PM Cottle, Steva Ready., MD CP-CP None       No orders of the defined types were placed in this encounter.      -------------------------------me

## 2023-01-16 ENCOUNTER — Ambulatory Visit (INDEPENDENT_AMBULATORY_CARE_PROVIDER_SITE_OTHER): Payer: Medicare Other | Admitting: Physician Assistant

## 2023-01-16 ENCOUNTER — Encounter: Payer: Self-pay | Admitting: Physician Assistant

## 2023-01-16 VITALS — BP 144/78 | HR 75 | Temp 98.5°F | Ht 62.0 in | Wt 129.0 lb

## 2023-01-16 DIAGNOSIS — K3532 Acute appendicitis with perforation and localized peritonitis, without abscess: Secondary | ICD-10-CM

## 2023-01-16 DIAGNOSIS — Z09 Encounter for follow-up examination after completed treatment for conditions other than malignant neoplasm: Secondary | ICD-10-CM

## 2023-01-16 NOTE — Progress Notes (Signed)
[facility] POST-OP OFFICE VISIT  01/16/2023  HPI: Andrea [name] is a 72 y.o. female 14 days s/p hand assisted laparoscopic partial cecectomy for perforated appendicitis with Dr Everlene Farrier.   She is doing very well given the circumstances She denied any pain at this time; never needed narcotic pain medications No fever, chills, nausea, emesis Good appetite and moving bowels  Ambulating well No other complaints   Vital signs: BP (!) 144/78   Pulse 75   Temp 98.5 F (36.9 C) (Oral)   Ht  (1.575 m)   Wt 129 lb (58.5 kg)   SpO2 96%   BMI 23.59 kg/m    Physical Exam: Constitutional: Well appearing female, NAD Abdomen: Soft, non-tender, non-distended, no rebound/guarding Skin: Laparoscopic incisions are healing well with staples (removed), some ecchymosis in various healing stages; no erythema or drainage. Previous drain site in suprapubic region is healed and closed.   Assessment/Plan: This is a 72 y.o. female 14 days s/p hand assisted laparoscopic partial cecectomy for perforated appendicitis with Dr Everlene Farrier.    - Staples removed; steri-strips placed  - Pain control prn  - Reviewed wound care recommendation  - Reviewed lifting restrictions; 4 weeks total  - Reviewed surgical pathology; Appendicitis  - She can follow up on as needed basis; She understands to call with questions/concerns  -- Lynden Oxford, PA-C [facility] 01/16/2023, 3:12 PM M-F: 7am - 4pm

## 2023-01-16 NOTE — Patient Instructions (Addendum)

## 2023-01-18 DIAGNOSIS — Z6823 Body mass index (BMI) 23.0-23.9, adult: Secondary | ICD-10-CM | POA: Diagnosis not present

## 2023-01-18 DIAGNOSIS — Z1231 Encounter for screening mammogram for malignant neoplasm of breast: Secondary | ICD-10-CM | POA: Diagnosis not present

## 2023-01-18 DIAGNOSIS — Z7689 Persons encountering health services in other specified circumstances: Secondary | ICD-10-CM | POA: Diagnosis not present

## 2023-01-18 DIAGNOSIS — Z01419 Encounter for gynecological examination (general) (routine) without abnormal findings: Secondary | ICD-10-CM | POA: Diagnosis not present

## 2023-01-25 ENCOUNTER — Ambulatory Visit (INDEPENDENT_AMBULATORY_CARE_PROVIDER_SITE_OTHER): Payer: Medicare Other | Admitting: Psychiatry

## 2023-01-25 ENCOUNTER — Encounter: Payer: Self-pay | Admitting: Psychiatry

## 2023-01-25 DIAGNOSIS — F339 Major depressive disorder, recurrent, unspecified: Secondary | ICD-10-CM

## 2023-01-25 DIAGNOSIS — F5105 Insomnia due to other mental disorder: Secondary | ICD-10-CM

## 2023-01-25 MED ORDER — ALPRAZOLAM 1 MG PO TABS
ORAL_TABLET | ORAL | 1 refills | Status: DC
Start: 2023-01-25 — End: 2023-04-13

## 2023-01-25 MED ORDER — PRAMIPEXOLE DIHYDROCHLORIDE 0.5 MG PO TABS: 0.7500 mg | ORAL_TABLET | Freq: Three times a day (TID) | ORAL | 0 refills | Status: DC

## 2023-01-25 NOTE — Progress Notes (Signed)
Andrea [name] [id number] 1951/03/05 72 y.o. Virtual Visit via Telephone Note  I connected with pt by telephone and verified that I am speaking with the correct person using two identifiers.   I discussed the limitations, risks, security and privacy concerns of performing an evaluation and management service by telephone and the availability of in person appointments. I also discussed with the patient that there may be a patient responsible charge related to this service. The patient expressed understanding and agreed to proceed.  I discussed the assessment and treatment plan with the patient. The patient was provided an opportunity to ask questions and all were answered. The patient agreed with the plan and demonstrated an understanding of the instructions.   The patient was advised to call back or seek an in-person evaluation if the symptoms worsen or if the condition fails to improve as anticipated.  I provided 30 minutes of non-face-to-face time during this encounter. The call started at 130 and ended at 200. The patient was located at car and the provider was located office. Pt having to drive to hlep Andrea with family emergency.  Subjective:   Patient ID:  Andrea [name] is a 72 y.o. (DOB 08/19/1951) female.  Chief Complaint:  No chief complaint on file.   HPI Andrea [name] presents to the office today for follow-up of MDE and anxiety disorder.  seen 09/2019.  No meds were changed.  Been on Lexapro 20 since early 2017.  02/27/2020 phone call from patient: Pt experiencing more anxiety than she normally has. Pt would like to know what are some options that she has as far as her medication. Pt will be in an appt  MD response: If the anxiety is just occasional then using alprazolam as needed would be an okay thing to do.  If it is been fairly persistent for a couple of weeks or more than the simplest solution would be to increase the Lexapro to 1-1/2 tablets daily.  Historically she has done well on  Lexapro 20 mg daily for an extended period of time.  If she is under temporary stress then once that is resolved we could drop it back to 1 daily.  If this does not work as expected then she should schedule an earlier appointment. Pt response: Patient called back and she's been having temporary stress, but it's not bad. Things are great, she's working out and feeling okay for the most part. It's been going on for a bit so she agrees to the increase Lexapro 20 mg 1.5 tablets daily. She has been taking alprazolam at hs occasionally to help sleep better and it does help.   12/06/2020 appointment with the following noted:  No covid.  H Covid and recovered.   Had increased Lexapro to 30 mg for 90 days and saw a difference but started seeing weight gain and was doing oK and able to drop back and been OK. Still renovating a house for a year.  Sold her house and mourns the house. Calmed down.  Kept 3 gkids and dogs 10 days.   Andrea [name] Hopes for new house May 1. Rare alprazolam for HS. Stress moving to condo and would have crying spell over the move and some problems she was running into dealing with it.  Hard with change.  But taking time to adjust to the idea of moving from her house of 23 years.  Initially refused but he left it up to her and she's come to peace about it.  Andrea [name].   Good response to medication and no SE.  Exercising is good medicine.  If doesn't then doesn't feel as good. Satisfied. Plan: no med changes  08/01/21  appt noted:  seen with Andrea [name] died 08/06/23.  A lot of ups and downs per Andrea.  Hard dips. Got worse when retired and dealing with the new  house.  Had trouble getting rid of things from the old house causing regrets over sellling the house.  Andrea notes doesn't do well with instability  and stressors.  Ruminates on past mistakes. Blamed Andrea [name] for the  difficulty renovating the house.  Neg self talk and beats herself up. When not enough sleep will get depressed.  Does better  if busy.   Dreads things.  Patient denies difficulty with sleep initiation or maintenance. Denies appetite disturbance.  Patient reports that energy and motivation have been good. Patient denies any difficulty with concentration.  Patient denies any suicidal ideation.  Plan: Abilify 2.5 mg daily for a week, then 5 mg daily. Continue Lexapro 20 mg daily  08/31/2021 appt noted: It worked immediately.  Insomnia with EMA. Normally 9 hours.  It's like I'm high.  Not tired.  Average 3-4 hours. Not depressed and no crazy negative thoughts.  Good response.  Energetic but up in middle of night. Plan: Okay to stop Abilify because of insomnia.   09/29/2021 appointment with the following noted: Parties were great.  Sleep problem resolved.  A lot of rest at the beach. Maybe the last week or 2 less motivation and socialization.  Not laying in the bed.  Just not as good as she was.  Not as outgoing as normal. No alcohol now.  Only had 1-2 at night.  Not ruminating.   Willing to restart Abilify at lower dose 2 mg daily. 3 grandsons  Plan: Relapse depression recurs she can resume Abilify  but lower dose 2 mg daily or every other day to try to avoid the insomnia where she can contact our office and we can discussed the possibility of an alternative antidepressant such as Trintellix or duloxetine.  12/28/2021 appointment noted: Several phone calls since she was here.  Abilify plus Lexapro failed.  Switch to duloxetine 90 mg which she started 12/02/2018 2023 Started lamotrigine. Also started Rexulti Sister has had cycling depression that has responded well to lamotrigine with poor response to SSRIs [name] with a little dementia. SE tremor 90 mg duloxetine, ? Wt gain over a few weeks.. Exercise and wt control Social isolation, low self esteem, worry about the way she looks and not usually that way. Crying better with duloxetine.  More dependent on H.   Anhedonia.  Black hole.  Never this low. No SI Having to use  Xanax.   Asks about day treatment and TMS Plan: rec TMS Continue lamotrigine as prescribed Increase Rexulti to 1 mg daily Switch to Trintellix:  reduce duloxetine to 2 of the 30 mg capsules and start Trintellix 5 mg daily for 5 days,  Then Increase Trintellix to 10 mg daily and reduce duloxetine to 1 capsule for 1 week. Then stop duloxetine.  02/15/22 appt noted: On Trintellix , Rexulti 1 and lamotrigine I feel good.  Walks 3 miles daily is great for mental health. TMS would not be covered by [facility]. Started feeling better after a week or 2 after the last visit. SE appetite increased. No nausea. Not crying anymore. Sleeping well and no longer ruminates. Plan: Continue Trintellix but DC  Rexulti due to weight gain.  Continue lamotrigine  03/06/2022 appointment with the following noted: Last 2 weeks has been very sad and nervous. Trintellix was increased to 20 mg daily  03/10/2022 complaining of ongoing depression, feeling jittery, negative thinking, early morning awakening, ruminating about past decisions.  Wanting to consider TMS because she is desperate for improvement. Plan we will schedule urgent appointment  03/15/22 urgent appt noted:  seen with Andrea [name] Very depressed, worst ever.  Don't want to be alone, ongoing worry, ruminating on past decisions.  Racing negative thoughts.  No motivation.  Trouble staying asleep.  Getting 4 hours and used to 9 hours. Dread getting up. Using Xanax Increased Trintellix to 20 mg 9 days ago. H agrees sleep is critical. Isolating.   Need to be better by July 11. Plan: Clonazepam 1 mg HS. Continue trintellix 20 mg daily she is only been on this dose 9 days and it needs more time. Re lamotrigine and increase to 150 mg daily.Leafy Kindle 1.5 mg every other day Option TMS  03/24/22 appt noted: So much better.  Thinking straighter and not negative and not sad.  Sleeping better 8-9 hours.. No crying. H notices progressively better every day.  Mind  clarity is much better.   No SE noted.  No hangover.  No nausea. Found a therapist, Krystal Eaton.   Feels well enough to go to [place] and kind of excited.  Back July 16.   Plan: Clonazepam 1 mg HS. This resolved insomnia without SE Continue trintellix 20 mg daily she is only been on this dose 20 days and it needs more time. Re lamotriginecontinue 150 mg daily.Leafy Kindle 1.5 mg every other day Option TMS  04/12/22 appt noted: Great.  Went to [place].  Was herself and enjoyed it.  Anxiety was managed.   [facility] and liked her. 3# wt gain.   Patient reports stable mood and denies depressed or irritable moods.  Patient denies any recent difficulty with anxiety.  Patient denies difficulty with sleep initiation or maintenance. Denies appetite disturbance.  Patient reports that energy and motivation have been good.  Patient denies any difficulty with concentration.  Patient denies any suicidal ideation. Sleeping well than not anxious. Plan: Trintellix 20  07/10/22 TC depression returned recently.  08/15/22 appt noted: Still blunted  and diminished motivation.  Some days better than others.  Going to gym.  Partially better.   Still has a lot of anxiety and doesn't want to be alone. Functioning but not normal.  Dreads parties.   SE tremor for a couple of week.sleep great.  Dep 7/10. Current Vraylar 1.5 mg daily, increase lamotrigine 200 mg daily, Trintellix 20 , clonazepam 0.5 mg HS Plan: Reduce Clonazepam 1/2  mg HS. This resolved insomnia without SE Continue trintellix 20 mg daily only mildly effective. lamotrigine continue 200 mg daily.Jerrilyn Cairo DT lost response Auvelity Stop Vraylar After Thanksgiving stop Trintellix Wait 3 days then [employer] 1 in the morning for 1 week,  and if no side effects then increase Auvelity to 1 in the AM and 1 in the PM  09/04/22 TC: Complaining of persistent depression and wanting to do something else to help.  Added  lithium CR 300 mg nightly  09/11/2022 phone call complaining of no improvement with Auvelity and lithium.  Appointment was moved up.  09/13/22 appt noted: Nothing changed. No better and no worse.  Awakens sad and tearful.  Sleeps well. Pushing herself to the gym.  Has a trainer. On clonazepam 0.5 mg HS She remains anxious when alone for no apparent reason.  Feelings of inferiority.  She is normally extroverted but now she is wanting to isolate from people.  All tasks seem monumental.  No enjoyment and not looking forward to anything.  Everything is a Personal assistant.  She remains generally worried and anxious which is not typical for her. Plan: Clonazepam 1/2  mg HS. This resolved insomnia without SE Stop Auvelity Start nortriptyline 1 of the 25 mg capsules at night for 4 nights then 2 at night for 4 nights then 3 at night, then wait 1 week and get the blood test in the morning at [facility] Reduce lamotrigine to 1 and 1/2 tablets daily  Lamotrigine has not been helpful to him 100 mg daily.  Start reducing lamotrigine 150 mg daily and will continue to taper at next appointment.  09/27/21 urgent appt :  she wanted urgent appt bc desperate to feel better.  Seen with H Called at least twice since here for the same reasons of depression. Not sleeping well even with clonazepam 1 mg HS. Doesn't want to be alone. Shakey.   On lithium 300 mg daily, nortriptyline 75 mg HS.   Doesn't want to live like this but not acutely suicidal. Tolerating meds. Plan: pursue [facility] protocol TMS at [facility] bc faster optin than alternatives  10/30/22 TC: finished 50 TMS tx in the week and wants appt ASAP bc still struggling with crying and depresssion..  11/03/22 urgent appt : Completed TMS last week. Tue-Thur better days and then Friday nose-dived. Not done well since got back.   H notes high anxiety and crying in AM and PM and doesn't want to be alone.  Xanxax seems to help. Xanax helps and taking 0.5 mg AM and 1 mg HS H  notes memory is sharper and she's more alert after treatment. Very anxioius all the time.  Get up sad.  Exercising. Sleep good with Xanax 1 mg HS. Plan: Increase Lexapro 20 mg dailly (should help crying and maybe anxiety but unlikely to resolve depression) Increase alprazolam to 0.5 mg TID and 1 mg HS for severe anxiety. H notes it helps. Pramipexole 0.25 BID for 5 days and if NR then 0.5 mg Bid off label. Reduce lamotrigine 100 mg for 2 weeks then 50 mg daily for 2 weeks then stop it. She wants to pursue Spravato if pramipexole fails  11/17/22 emergency work in appt: Pramipexole was not sent in and MD not aware until yesterday.  She is not any better and is desperate for a med change. Sleeping well and taking Xanax 1 mg HS Mornings are worse.  Not crying much.  Last Friday was a bad day and she didn't want to be alone.  But did let her H play golf once this week. Reduced lamotrigine today to 50 BID and weaning off. H says better this week than last week. Increased Lexapro to 20 mg daily. Plan: Increased Lexapro 20 mg dailly (helped crying and maybe anxiety but unlikely to resolve depression) Continue alprazolam to 0.5 mg TID prn anxiety and 1 mg HS. H notes it helps. Pramipexole 0.25 mg BID for 3 days then 0.5 mg BID Reduce lamotrigine 50 mg daily for 2 weeks then stop it. She wants to pursue Spravato if pramipexole fails  12/25/22 appt urgently. COMPLETED 1 WEEK TMS [facility] without benefit except TMS took away brain fog.   She feels  need to take Xanax 0.5 mg TID  Some sleepiness with Xanax . Mornings are better and fine while at the gym but when home gets depressed again.   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 Needs Xanax to do social things. Plan: Increased Lexapro 20 mg dailly (helped crying and maybe anxiety but unlikely to resolve depression) Continue alprazolam to 0.5 mg TID prn anxiety and 1 mg HS. H notes it helps. Change pramipexole 0.5mg  tablets, 1 tablet four times daily for 1  week,  Then if not better 1and 1/2 tablets in the AM and dinner and 1 tablet at lunch and dinner.    4/9-4/15 hosp for ruptured appendix and missed meds: Can restart Lexapro at 1/2 tablet daily for 3 days then 1 daily. Restart 1/2 pramipexole tablet 3 times daily for 3 days then 1 tablet 3 times daily for 3 days then 1 and 1/2 tablet 3 times daily.      01/12/23 appt noted: No GI sx now.  No appetite but working on protein.   I think I'm doing real well mentally.  Has resumed meds.  Mood has been great. Pramipexole 0.5mg   1 and 1/2 TID Lexapro 20 Xanax reduced to 1 mg HS H sees a difference.   No SE with meds.   No med changes  01/25/23 appt noted: Meds:  pramipexole 0.75 mg TID, Xanax 1.5 mg HS and 0.5 mg prn, Lexapro 20 mg daily. Doing great.  Staples removed and healing from surgery appendectomy. Back in the gym helps her.   Satisfied with meds. Getting out socially and feeling like her old self.  Chelsea's H Brad went to ER vomiting blood.      ECT-MADRS    Flowsheet Row Office Visit from 11/03/2022 in [facility]  MADRS Total Score 40      PHQ2-9    Flowsheet Row Clinical Support from 02/24/2022 in [facility] Video Visit from 06/14/2021 in [facility] at [place]  PHQ-2 Total Score 0 0  PHQ-9 Total Score -- 0      Flowsheet Row ED to Hosp-Admission (Discharged) from 01/02/2023 in [facility] from 12/03/2022 in [facility]  ED from 11/06/2022 in [facility] Emergency Department at [facility]  C-SSRS RISK CATEGORY No Risk No Risk No Risk      Past Psychiatric Medication Trials: Sertraline, fluoxetine, citalopram 50, nefazodone, Lexapro 30 weight gain,  Viibryd 30, Wellbutrin 300, Duloxetine 90 SE tremor, mirtazapine,  Trintellix 20 little response Auvelity twice daily for 3 weeks no response Nortriptyline 75 level  86 NR Auvelity BID  3 weeks NR  Lamotrigine 200 no response Abilify, Rexulti 0.5 Vraylar 1.5 daily tremor and lost response Lithium 300 shakes  methyl folate buspirone,  alprazolam, clonazepam, Under the care of [facility] since January 2001  Sister does well on lamotrigine.  Review of Systems:  Review of Systems  Constitutional:  Negative for appetite change and fatigue.  Cardiovascular:  Negative for palpitations.  Gastrointestinal:  Negative for nausea.  Neurological:  Negative for dizziness and light-headedness.  Psychiatric/Behavioral:  Negative for behavioral problems, confusion, decreased concentration, dysphoric mood, hallucinations, self-injury, sleep disturbance and suicidal ideas. The patient is not nervous/anxious and is not hyperactive.     Medications: I have reviewed the patient's current medications.  Current Outpatient Medications  Medication Sig Dispense Refill   ALPRAZolam (XANAX) 1 MG tablet 1/2 tablet  3 times daily as needed for anxiety and 1 tablet at night (Patient taking differently: Taking 1/2 tablet twice daily and 1 and 1/2 tablets at night) 75 tablet 1   atorvastatin (LIPITOR) 20 MG tablet Take 20 mg by mouth.      calcium citrate-vitamin Andrea (CITRACAL+Andrea) 315-200 MG-UNIT tablet Take 1 tablet by mouth daily.     escitalopram (LEXAPRO) 20 MG tablet TAKE 1 TABLET BY MOUTH EVERY DAY 90 tablet 0   fluticasone (FLONASE) 50 MCG/ACT nasal spray fluticasone propionate 50 mcg/actuation nasal spray,suspension     levothyroxine (SYNTHROID, LEVOTHROID) 50 MCG tablet Take 50 mcg by mouth daily before breakfast.     pramipexole (MIRAPEX) 0.5 MG tablet 1 and 1/2 tablets in the AM and 6 PM and 1 tablet at noon and bedtime 180 tablet 0   raloxifene (EVISTA) 60 MG tablet Take 60 mg by mouth daily.     XIIDRA 5 % SOLN Place 1 drop into both eyes daily.     No current facility-administered medications for this visit.    Medication Side Effects:  None  Allergies:  Allergies  Allergen Reactions   Hydrocodone-Acetaminophen Other (See Comments)    Passes out   Morphine Other (See Comments)    Passes out    Past Medical History:  Diagnosis Date   GAD (generalized anxiety disorder)    Hyperlipidemia    Hypothyroidism    Osteopenia    Sinusitis     Family History  Problem Relation Age of Onset   Arthritis Mother    COPD Mother    Hypercholesterolemia Mother    Dementia Mother    Stroke Mother    Prostate cancer Brother    Cancer Maternal Grandmother        Oral    Social History   Socioeconomic History   Marital status: Married    Spouse name: Not on file   Number of children: Not on file   Years of education: Not on file   Highest education level: Not on file  Occupational History   Not on file  Tobacco Use   Smoking status: Never   Smokeless tobacco: Never  Vaping Use   Vaping Use: Never used  Substance and Sexual Activity   Alcohol use: Not Currently    Comment: social   Drug use: Never   Sexual activity: Not on file  Other Topics Concern   Not on file  Social History Narrative   Married.   1 child. 2 grandchildren.   Retired Once worked as a Psychologist, sport and exercise.   Enjoys exercising, spending time with family   Social Determinants of Health   Financial Resource Strain: Low Risk  (02/24/2022)   Overall Financial Resource Strain (CARDIA)    Difficulty of Paying Living Expenses: Not hard at all  Food Insecurity: No Food Insecurity (01/08/2023)   Hunger Vital Sign    Worried About Running Out of Food in the Last Year: Never true    Ran Out of Food in the Last Year: Never true  Transportation Needs: No Transportation Needs (01/08/2023)   PRAPARE - Administrator, Civil Service (Medical): No    Lack of Transportation (Non-Medical): No  Physical Activity: Sufficiently Active (02/24/2022)   Exercise Vital Sign    Days of Exercise per Week: 5 days    Minutes of Exercise per Session: 90 min   Stress: No Stress Concern Present (02/24/2022)   [employer] of Occupational Health - Occupational Stress Questionnaire  Feeling of Stress : Not at all  Social Connections: Not on file  Intimate Partner Violence: Not At Risk (01/02/2023)   Humiliation, Afraid, Rape, and Kick questionnaire    Fear of Current or Ex-Partner: No    Emotionally Abused: No    Physically Abused: No    Sexually Abused: No    Past Medical History, Surgical history, Social history, and Family history were reviewed and updated as appropriate.   3 grandsons.  Please see review of systems for further details on the patient's review from today.   Objective:   Physical Exam:  There were no vitals taken for this visit.  Physical Exam Neurological:     Mental Status: She is alert and oriented to person, place, and time.     Cranial Nerves: No dysarthria.  Psychiatric:        Attention and Perception: Attention and perception normal.        Mood and Affect: Mood normal.        Speech: Speech normal.        Behavior: Behavior is cooperative.        Thought Content: Thought content normal. Thought content is not paranoid or delusional. Thought content does not include homicidal or suicidal ideation. Thought content does not include suicidal plan.        Cognition and Memory: Cognition and memory normal.        Judgment: Judgment normal.     Comments: Insight intact     Lab Review:     Component Value Date/Time   NA 132 (L) 01/07/2023 0518   K 3.6 01/07/2023 0518   CL 102 01/07/2023 0518   CO2 25 01/07/2023 0518   GLUCOSE 97 01/07/2023 0518   BUN 5 (L) 01/07/2023 0518   CREATININE 0.54 01/07/2023 0518   CALCIUM 8.1 (L) 01/07/2023 0518   PROT 5.4 (L) 01/05/2023 0403   ALBUMIN 2.7 (L) 01/05/2023 0403   AST 24 01/05/2023 0403   ALT 23 01/05/2023 0403   ALKPHOS 37 (L) 01/05/2023 0403   BILITOT 0.5 01/05/2023 0403   GFRNONAA >60 01/07/2023 0518       Component Value Date/Time   WBC 7.4  01/05/2023 0403   RBC 3.22 (L) 01/05/2023 0403   HGB 10.2 (L) 01/05/2023 0403   HCT 30.4 (L) 01/05/2023 0403   PLT 239 01/05/2023 0403   MCV 94.4 01/05/2023 0403   MCH 31.7 01/05/2023 0403   MCHC 33.6 01/05/2023 0403   RDW 11.9 01/05/2023 0403   LYMPHSABS 1.6 01/02/2023 0602   MONOABS 0.8 01/02/2023 0602   EOSABS 0.1 01/02/2023 0602   BASOSABS 0.0 01/02/2023 0602    No results found for: "POCLITH", "LITHIUM"   No results found for: "PHENYTOIN", "PHENOBARB", "VALPROATE", "CBMZ"   .res Assessment: Plan:    No diagnosis found.    Recent worsening mood as noted before.   Marked  dramatically positive response to pramipexole 0.5 mg tablets, 1-1/2 tabs 3 times daily added to Lexapro 20 mg and she is out of depression.  She is much less anxious and has been able to reduce the alprazolam just to taking it at night.  She has become much more functional again closer to her baseline.  She has recovered from her appendectomy for the most part. Dep again in remission   She has failed multiple meds as noted above including all the usual categories of antidepressants with the exception of  MAO inhibitors.  Or pramipexole. Consider Latuda, Seroquel (not likely to  be tolerated DT wt), Olanzapine Fluox Combo.  We discussed the short-term risks associated with benzodiazepines including sedation and increased fall risk among others.  Discussed long-term side effect risk including dependence, potential withdrawal symptoms, and the potential eventual dose-related risk of dementia.  But recent studies from 2020 dispute this association between benzodiazepines and dementia risk. Newer studies in 2020 do not support an association with dementia.  Consider Genesight  Continue Lexapro 20 mg dailly (helped crying and maybe anxiety but unlikely to resolve depression) Continue alprazolam  1 mg HS. H notes it helps. Change pramipexole 0.5mg  tablets, 1 & 1/2 tablet  times daily  Disc off label use pramipexole  and the dosage range 1-5 mg daily and risk higher dose including manic type sx, impulsivity.  Rec counseling .  She has a scheduled appt.  FU 2 mos bc more stable  Meredith Staggers, MD, DFAPA  Please see After Visit Summary for patient specific instructions.  Meredith Staggers, MD, DFAPA   No future appointments.     No orders of the defined types were placed in this encounter.      -------------------------------me

## 2023-01-26 ENCOUNTER — Other Ambulatory Visit: Payer: Self-pay | Admitting: Psychiatry

## 2023-01-26 DIAGNOSIS — F3341 Major depressive disorder, recurrent, in partial remission: Secondary | ICD-10-CM

## 2023-01-26 NOTE — Telephone Encounter (Signed)
Stopped it

## 2023-01-29 ENCOUNTER — Other Ambulatory Visit: Payer: Self-pay | Admitting: Psychiatry

## 2023-01-29 ENCOUNTER — Ambulatory Visit: Payer: Medicare Other

## 2023-01-29 DIAGNOSIS — F339 Major depressive disorder, recurrent, unspecified: Secondary | ICD-10-CM

## 2023-01-31 ENCOUNTER — Ambulatory Visit
Admission: EM | Admit: 2023-01-31 | Discharge: 2023-01-31 | Disposition: A | Discharge status: Home / Self Care | Payer: Medicare Other | Attending: Emergency Medicine | Admitting: Emergency Medicine

## 2023-01-31 DIAGNOSIS — H6692 Otitis media, unspecified, left ear: Secondary | ICD-10-CM | POA: Diagnosis not present

## 2023-01-31 MED ORDER — AZITHROMYCIN 250 MG PO TABS: 250.0000 mg | ORAL_TABLET | Freq: Every day | ORAL | 0 refills | Status: DC

## 2023-01-31 NOTE — ED Triage Notes (Signed)
Patient to Urgent Care with complaints of sinus congestion x3 days. Also with complaints of left sided earache.   Denies any known fevers.  Has been taking claritin.

## 2023-01-31 NOTE — Discharge Instructions (Addendum)
Take the Zithromax as directed.  Follow up with your primary care provider or ENT.
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
## 2023-02-07 ENCOUNTER — Encounter: Payer: Self-pay | Admitting: Psychiatry

## 2023-02-07 ENCOUNTER — Telehealth: Payer: Self-pay | Admitting: Surgery

## 2023-02-07 ENCOUNTER — Ambulatory Visit (INDEPENDENT_AMBULATORY_CARE_PROVIDER_SITE_OTHER): Payer: Medicare Other | Admitting: Psychiatry

## 2023-02-07 DIAGNOSIS — F5105 Insomnia due to other mental disorder: Secondary | ICD-10-CM

## 2023-02-07 DIAGNOSIS — F339 Major depressive disorder, recurrent, unspecified: Secondary | ICD-10-CM | POA: Diagnosis not present

## 2023-02-07 DIAGNOSIS — F411 Generalized anxiety disorder: Secondary | ICD-10-CM

## 2023-02-07 NOTE — Telephone Encounter (Signed)
Patient had lap appendectomy done on 01/02/23 with Dr. Everlene Farrier.  She states that since she has lost some weight, just not much of an appetite at all.  Not sure if this is normal.  Concerned with losing weight.  Please call her. Thank you.

## 2023-02-07 NOTE — Progress Notes (Signed)
Andrea [name] [id number] [birth year] 72 y.o. Virtual Visit via Telephone Note  I connected with pt by telephone and verified that I am speaking with the correct person using two identifiers.   I discussed the limitations, risks, security and privacy concerns of performing an evaluation and management service by telephone and the availability of in person appointments. I also discussed with the patient that there may be a patient responsible charge related to this service. The patient expressed understanding and agreed to proceed.  I discussed the assessment and treatment plan with the patient. The patient was provided an opportunity to ask questions and all were answered. The patient agreed with the plan and demonstrated an understanding of the instructions.   The patient was advised to call back or seek an in-person evaluation if the symptoms worsen or if the condition fails to improve as anticipated.  I provided 30 minutes of non-face-to-face time during this encounter. The patient was located at car and the provider was located office. Session 11 AM to 1130.  Subjective:   Patient ID:  Andrea [name] is a 72 y.o. (DOB [birth year]) female.  Chief Complaint:  Chief Complaint  Patient presents with   Follow-up   Depression   Anxiety   Sleeping Problem    HPI AUTRY STRIPLIN presents to the office today for follow-up of MDE and anxiety disorder.  seen 72/2021.  No meds were changed.  Been on Lexapro 20 since early 2017.  02/27/2020 phone call from patient: Pt experiencing more anxiety than she normally has. Pt would like to know what are some options that she has as far as her medication. Pt will be in an appt  MD response: If the anxiety is just occasional then using alprazolam as needed would be an okay thing to do.  If it is been fairly persistent for a couple of weeks or more than the simplest solution would be to increase the Lexapro to 1-1/2 tablets daily.  Historically she has done well on  Lexapro 20 mg daily for an extended period of time.  If she is under temporary stress then once that is resolved we could drop it back to 1 daily.  If this does not work as expected then she should schedule an earlier appointment. Pt response: Patient called back and she's been having temporary stress, but it's not bad. Things are great, she's working out and feeling okay for the most part. It's been going on for a bit so she agrees to the increase Lexapro 20 mg 1.5 tablets daily. She has been taking alprazolam at hs occasionally to help sleep better and it does help.   12/06/2020 appointment with the following noted:  No covid.  H Covid and recovered.   Had increased Lexapro to 30 mg for 90 days and saw a difference but started seeing weight gain and was doing oK and able to drop back and been OK. Still renovating a house for a year.  Sold her house and mourns the house. Calmed down.  Kept 3 gkids and dogs 10 days.   Andrea [name] for new house May 1. Rare alprazolam for HS. Stress moving to condo and would have crying spell over the move and some problems she was running into dealing with it.  Hard with change.  But taking time to adjust to the idea of moving from her house of 23 years.  Initially refused but he left it up to her and she's come to peace about  it.  Andrea [name].   Good response to medication and no SE.  Exercising is good medicine.  If doesn't then doesn't feel as good. Satisfied. Plan: no med changes  08/01/21  appt noted:  seen with [name] died 08/08/23.  A lot of ups and downs per D.  Hard dips. Got worse when retired and dealing with the new  house.  Had trouble getting rid of things from the old house causing regrets over sellling the house.  D notes doesn't do well with instability  and stressors.  Ruminates on past mistakes. Blamed [name] for the  difficulty renovating the house.  Neg self talk and beats herself up. When not enough sleep will get depressed.  Does better  if busy.   Dreads things.  Patient denies difficulty with sleep initiation or maintenance. Denies appetite disturbance.  Patient reports that energy and motivation have been good. Patient denies any difficulty with concentration.  Patient denies any suicidal ideation.  Plan: Abilify 2.5 mg daily for a week, then 5 mg daily. Continue Lexapro 20 mg daily  08/31/2021 appt noted: It worked immediately.  Insomnia with EMA. Normally 9 hours.  It's like I'm high.  Not tired.  Average 3-4 hours. Not depressed and no crazy negative thoughts.  Good response.  Energetic but up in middle of night. Plan: Okay to stop Abilify because of insomnia.   09/29/2021 appointment with the following noted: Parties were great.  Sleep problem resolved.  A lot of rest at the beach. Maybe the last week or 2 less motivation and socialization.  Not laying in the bed.  Just not as good as she was.  Not as outgoing as normal. No alcohol now.  Only had 1-2 at night.  Not ruminating.   Willing to restart Abilify at lower dose 2 mg daily. 3 grandsons  Plan: Relapse depression recurs she can resume Abilify  but lower dose 2 mg daily or every other day to try to avoid the insomnia where she can contact our office and we can discussed the possibility of an alternative antidepressant such as Trintellix or duloxetine.  12/28/2021 appointment noted: Several phone calls since she was here.  Abilify plus Lexapro failed.  Switch to duloxetine 90 mg which she started 12/02/2018 2023 Started lamotrigine. Also started Rexulti Sister has had cycling depression that has responded well to lamotrigine with poor response to SSRIs [name] with a little dementia. SE tremor 90 mg duloxetine, ? Wt gain over a few weeks.. Exercise and wt control Social isolation, low self esteem, worry about the way she looks and not usually that way. Crying better with duloxetine.  More dependent on H.   Anhedonia.  Black hole.  Never this low. No SI Having to use  Xanax.   Asks about day treatment and TMS Plan: rec TMS Continue lamotrigine as prescribed Increase Rexulti to 1 mg daily Switch to Trintellix:  reduce duloxetine to 2 of the 30 mg capsules and start Trintellix 5 mg daily for 5 days,  Then Increase Trintellix to 10 mg daily and reduce duloxetine to 1 capsule for 1 week. Then stop duloxetine.  02/15/22 appt noted: On Trintellix , Rexulti 1 and lamotrigine I feel good.  Walks 3 miles daily is great for mental health. TMS would not be covered by [facility]. Started feeling better after a week or 2 after the last visit. SE appetite increased. No nausea. Not crying anymore. Sleeping well and no longer ruminates. Plan: Continue Trintellix  but DC Rexulti due to weight gain.  Continue lamotrigine  03/06/2022 appointment with the following noted: Last 2 weeks has been very sad and nervous. Trintellix was increased to 20 mg daily  03/10/2022 complaining of ongoing depression, feeling jittery, negative thinking, early morning awakening, ruminating about past decisions.  Wanting to consider TMS because she is desperate for improvement. Plan we will schedule urgent appointment  03/15/22 urgent appt noted:  seen with Andrea [name] Very depressed, worst ever.  Don't want to be alone, ongoing worry, ruminating on past decisions.  Racing negative thoughts.  No motivation.  Trouble staying asleep.  Getting 4 hours and used to 9 hours. Dread getting up. Using Xanax Increased Trintellix to 20 mg 9 days ago. H agrees sleep is critical. Isolating.   Need to be better by July 11. Plan: Clonazepam 1 mg HS. Continue trintellix 20 mg daily she is only been on this dose 9 days and it needs more time. Re lamotrigine and increase to 150 mg daily.Andrea [name] 1.5 mg every other day Option TMS  03/24/22 appt noted: So much better.  Thinking straighter and not negative and not sad.  Sleeping better 8-9 hours.. No crying. H notices progressively better every day.  Mind  clarity is much better.   No SE noted.  No hangover.  No nausea. Found a therapist, Krystal Eaton.   Feels well enough to go to [place] and kind of excited.  Back July 16.   Plan: Clonazepam 1 mg HS. This resolved insomnia without SE Continue trintellix 20 mg daily she is only been on this dose 20 days and it needs more time. Re lamotriginecontinue 150 mg daily.Andrea [name] 1.5 mg every other day Option TMS  04/12/22 appt noted: Great.  Went to [place].  Was herself and enjoyed it.  Anxiety was managed.   [facility] and liked her. 3# wt gain.   Patient reports stable mood and denies depressed or irritable moods.  Patient denies any recent difficulty with anxiety.  Patient denies difficulty with sleep initiation or maintenance. Denies appetite disturbance.  Patient reports that energy and motivation have been good.  Patient denies any difficulty with concentration.  Patient denies any suicidal ideation. Sleeping well than not anxious. Plan: Trintellix 20  07/10/22 TC depression returned recently.  08/15/22 appt noted: Still blunted  and diminished motivation.  Some days better than others.  Going to gym.  Partially better.   Still has a lot of anxiety and doesn't want to be alone. Functioning but not normal.  Dreads parties.   SE tremor for a couple of week.sleep great.  Dep 7/10. Current Vraylar 1.5 mg daily, increase lamotrigine 200 mg daily, Trintellix 20 , clonazepam 0.5 mg HS Plan: Reduce Clonazepam 1/2  mg HS. This resolved insomnia without SE Continue trintellix 20 mg daily only mildly effective. lamotrigine continue 200 mg daily.Jerrilyn Cairo DT lost response Auvelity Stop Vraylar After Thanksgiving stop Trintellix Wait 3 days then [employer] 1 in the morning for 1 week,  and if no side effects then increase Auvelity to 1 in the AM and 1 in the PM  09/04/22 TC: Complaining of persistent depression and wanting to do something else to help.  Added  lithium CR 300 mg nightly  09/11/2022 phone call complaining of no improvement with Auvelity and lithium.  Appointment was moved up.  09/13/22 appt noted: Nothing changed. No better and no worse.  Awakens sad  and tearful.  Sleeps well. Pushing herself to the gym.  Has a trainer. On clonazepam 0.5 mg HS She remains anxious when alone for no apparent reason.  Feelings of inferiority.  She is normally extroverted but now she is wanting to isolate from people.  All tasks seem monumental.  No enjoyment and not looking forward to anything.  Everything is a Personal assistant.  She remains generally worried and anxious which is not typical for her. Plan: Clonazepam 1/2  mg HS. This resolved insomnia without SE Stop Auvelity Start nortriptyline 1 of the 25 mg capsules at night for 4 nights then 2 at night for 4 nights then 3 at night, then wait 1 week and get the blood test in the morning at [facility] Reduce lamotrigine to 1 and 1/2 tablets daily  Lamotrigine has not been helpful to him 100 mg daily.  Start reducing lamotrigine 150 mg daily and will continue to taper at next appointment.  09/27/21 urgent appt :  she wanted urgent appt bc desperate to feel better.  Seen with H Called at least twice since here for the same reasons of depression. Not sleeping well even with clonazepam 1 mg HS. Doesn't want to be alone. Shakey.   On lithium 300 mg daily, nortriptyline 75 mg HS.   Doesn't want to live like this but not acutely suicidal. Tolerating meds. Plan: pursue [facility] protocol TMS at [facility] bc faster optin than alternatives  10/30/22 TC: finished 50 TMS tx in the week and wants appt ASAP bc still struggling with crying and depresssion..  11/03/22 urgent appt : Completed TMS last week. Tue-Thur better days and then Friday nose-dived. Not done well since got back.   H notes high anxiety and crying in AM and PM and doesn't want to be alone.  Xanxax seems to help. Xanax helps and taking 0.5 mg AM and 1 mg HS H  notes memory is sharper and she's more alert after treatment. Very anxioius all the time.  Get up sad.  Exercising. Sleep good with Xanax 1 mg HS. Plan: Increase Lexapro 20 mg dailly (should help crying and maybe anxiety but unlikely to resolve depression) Increase alprazolam to 0.5 mg TID and 1 mg HS for severe anxiety. H notes it helps. Pramipexole 0.25 BID for 5 days and if NR then 0.5 mg Bid off label. Reduce lamotrigine 100 mg for 2 weeks then 50 mg daily for 2 weeks then stop it. She wants to pursue Spravato if pramipexole fails  11/17/22 emergency work in appt: Pramipexole was not sent in and MD not aware until yesterday.  She is not any better and is desperate for a med change. Sleeping well and taking Xanax 1 mg HS Mornings are worse.  Not crying much.  Last Friday was a bad day and she didn't want to be alone.  But did let her H play golf once this week. Reduced lamotrigine today to 50 BID and weaning off. H says better this week than last week. Increased Lexapro to 20 mg daily. Plan: Increased Lexapro 20 mg dailly (helped crying and maybe anxiety but unlikely to resolve depression) Continue alprazolam to 0.5 mg TID prn anxiety and 1 mg HS. H notes it helps. Pramipexole 0.25 mg BID for 3 days then 0.5 mg BID Reduce lamotrigine 50 mg daily for 2 weeks then stop it. She wants to pursue Spravato if pramipexole fails  12/25/22 appt urgently. COMPLETED 1 WEEK TMS [facility] without benefit except TMS took away brain fog.  She feels need to take Xanax 0.5 mg TID  Some sleepiness with Xanax . Mornings are better and fine while at the gym but when home gets depressed again.   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 Needs Xanax to do social things. Plan: Increased Lexapro 20 mg dailly (helped crying and maybe anxiety but unlikely to resolve depression) Continue alprazolam to 0.5 mg TID prn anxiety and 1 mg HS. H notes it helps. Change pramipexole 0.5mg  tablets, 1 tablet four times daily for 1  week,  Then if not better 1and 1/2 tablets in the AM and dinner and 1 tablet at lunch and dinner.    4/9-4/15 hosp for ruptured appendix and missed meds: Can restart Lexapro at 1/2 tablet daily for 3 days then 1 daily. Restart 1/2 pramipexole tablet 3 times daily for 3 days then 1 tablet 3 times daily for 3 days then 1 and 1/2 tablet 3 times daily.      01/12/23 appt noted: No GI sx now.  No appetite but working on protein.   I think I'm doing real well mentally.  Has resumed meds.  Mood has been great. Pramipexole 0.5mg   1 and 1/2 TID Lexapro 20 Xanax reduced to 1 mg HS H sees a difference.   No SE with meds.   No med changes  01/25/23 appt noted: Meds:  pramipexole 0.75 mg TID, Xanax 1.5 mg HS and 0.5 mg prn, Lexapro 20 mg daily. Doing great.  Staples removed and healing from surgery appendectomy. Back in the gym helps her.   Satisfied with meds. Getting out socially and feeling like her old self.  Chelsea's H Brad went to ER vomiting blood.   Plan: no med changes  02/07/23 appt noted: Back from [facility] yesterday.  Had ear infection.  Then conjunctivitis.  Eyes hurt.   Before the surgery was getting better and feels like she is sliding back some.  Gradually a little worse since here.  Wonders if she could get better with joy and social drive.   Off Abx .  Sleep is good with meds.  With depression then gets anxious too.  Is functioning.   Meds:  pramipexole 0.75 mg TID, Xanax 1.5 mg HS and 0.5 mg prn, Lexapro 20 mg daily. Consistent and no SE.   ECT-MADRS    Flowsheet Row Office Visit from 11/03/2022 in [facility]  MADRS Total Score 40      PHQ2-9    Flowsheet Row Clinical Support from 02/24/2022 in [facility] Video Visit from 06/14/2021 in [facility] [place]  PHQ-2 Total Score 0 0  PHQ-9 Total Score -- 0      Flowsheet Row ED from 01/31/2023 in [facility]  ED  to Hosp-Admission (Discharged) from 01/02/2023 in [facility] ED from 12/03/2022 in [facility] at [facility]   C-SSRS RISK CATEGORY No Risk No Risk No Risk      Past Psychiatric Medication Trials: Sertraline, fluoxetine, citalopram 50, nefazodone, Lexapro 30 weight gain,  Viibryd 30, Wellbutrin 300, Duloxetine 90 SE tremor, mirtazapine,  Trintellix 20 little response Auvelity twice daily for 3 weeks no response Nortriptyline 75 level 86 NR Auvelity BID  3 weeks NR  Lamotrigine 200 no response Abilify, Rexulti 0.5 Vraylar 1.5 daily tremor and lost response Lithium 300 shakes  methyl folate buspirone,  alprazolam, clonazepam, Under the care of [facility] since  January 2001  Sister does well on lamotrigine.  Review of Systems:  Review of Systems  Constitutional:  Negative for appetite change and fatigue.  Cardiovascular:  Negative for palpitations.  Gastrointestinal:  Negative for nausea.  Neurological:  Negative for dizziness and light-headedness.  Psychiatric/Behavioral:  Positive for dysphoric mood. Negative for behavioral problems, confusion, decreased concentration, hallucinations, self-injury, sleep disturbance and suicidal ideas. The patient is not nervous/anxious and is not hyperactive.     Medications: I have reviewed the patient's current medications.  Current Outpatient Medications  Medication Sig Dispense Refill   ALPRAZolam (XANAX) 1 MG tablet 1/2 tablet 3 times daily as needed for anxiety and 1 tablet at night 75 tablet 1   atorvastatin (LIPITOR) 20 MG tablet Take 20 mg by mouth.      calcium citrate-vitamin D (CITRACAL+D) 315-200 MG-UNIT tablet Take 1 tablet by mouth daily.     escitalopram (LEXAPRO) 20 MG tablet TAKE 1 TABLET BY MOUTH EVERY DAY 90 tablet 0   fluticasone (FLONASE) 50 MCG/ACT nasal spray fluticasone propionate 50 mcg/actuation nasal spray,suspension     levothyroxine (SYNTHROID,  LEVOTHROID) 50 MCG tablet Take 50 mcg by mouth daily before breakfast.     pramipexole (MIRAPEX) 0.5 MG tablet Take 1.5 tablets (0.75 mg total) by mouth 3 (three) times daily. 400 tablet 0   raloxifene (EVISTA) 60 MG tablet Take 60 mg by mouth daily.     XIIDRA 5 % SOLN Place 1 drop into both eyes daily.     azithromycin (ZITHROMAX) 250 MG tablet Take 1 tablet (250 mg total) by mouth daily. Take first 2 tablets together, then 1 every day until finished. (Patient not taking: Reported on 02/07/2023) 6 tablet 0   No current facility-administered medications for this visit.    Medication Side Effects: None  Allergies:  Allergies  Allergen Reactions   Hydrocodone-Acetaminophen Other (See Comments)    Passes out   Morphine Other (See Comments)    Passes out    Past Medical History:  Diagnosis Date   GAD (generalized anxiety disorder)    Hyperlipidemia    Hypothyroidism    Osteopenia    Sinusitis     Family History  Problem Relation Age of Onset   Arthritis Mother    COPD Mother    Hypercholesterolemia Mother    Dementia Mother    Stroke Mother    Prostate cancer Brother    Cancer Maternal Grandmother        Oral    Social History   Socioeconomic History   Marital status: Married    Spouse name: Not on file   Number of children: Not on file   Years of education: Not on file   Highest education level: Not on file  Occupational History   Not on file  Tobacco Use   Smoking status: Never   Smokeless tobacco: Never  Vaping Use   Vaping Use: Never used  Substance and Sexual Activity   Alcohol use: Not Currently    Comment: social   Drug use: Never   Sexual activity: Not on file  Other Topics Concern   Not on file  Social History Narrative   Married.   1 child. 2 grandchildren.   Retired Once worked as a Psychologist, sport and exercise.   Enjoys exercising, spending time with family   Social Determinants of Health   Financial Resource Strain: Low Risk  (02/24/2022)   Overall  Financial Resource Strain (CARDIA)    Difficulty of Paying Living Expenses: Not hard  at all  Food Insecurity: No Food Insecurity (01/08/2023)   Hunger Vital Sign    Worried About Running Out of Food in the Last Year: Never true    Ran Out of Food in the Last Year: Never true  Transportation Needs: No Transportation Needs (01/08/2023)   PRAPARE - Administrator, Civil Service (Medical): No    Lack of Transportation (Non-Medical): No  Physical Activity: Sufficiently Active (02/24/2022)   Exercise Vital Sign    Days of Exercise per Week: 5 days    Minutes of Exercise per Session: 90 min  Stress: No Stress Concern Present (02/24/2022)   [employer] of Occupational Health - Occupational Stress Questionnaire    Feeling of Stress : Not at all  Social Connections: Not on file  Intimate Partner Violence: Not At Risk (01/02/2023)   Humiliation, Afraid, Rape, and Kick questionnaire    Fear of Current or Ex-Partner: No    Emotionally Abused: No    Physically Abused: No    Sexually Abused: No    Past Medical History, Surgical history, Social history, and Family history were reviewed and updated as appropriate.   3 grandsons.  Please see review of systems for further details on the patient's review from today.   Objective:   Physical Exam:  There were no vitals taken for this visit.  Physical Exam Neurological:     Mental Status: She is alert and oriented to person, place, and time.     Cranial Nerves: No dysarthria.  Psychiatric:        Attention and Perception: Attention and perception normal.        Mood and Affect: Mood is anxious and depressed.        Speech: Speech normal.        Behavior: Behavior is cooperative.        Thought Content: Thought content normal. Thought content is not paranoid or delusional. Thought content does not include homicidal or suicidal ideation. Thought content does not include suicidal plan.        Cognition and Memory: Cognition and memory  normal.        Judgment: Judgment normal.     Comments: Insight intact     Lab Review:     Component Value Date/Time   NA 132 (L) 01/07/2023 0518   K 3.6 01/07/2023 0518   CL 102 01/07/2023 0518   CO2 25 01/07/2023 0518   GLUCOSE 97 01/07/2023 0518   BUN 5 (L) 01/07/2023 0518   CREATININE 0.54 01/07/2023 0518   CALCIUM 8.1 (L) 01/07/2023 0518   PROT 5.4 (L) 01/05/2023 0403   ALBUMIN 2.7 (L) 01/05/2023 0403   AST 24 01/05/2023 0403   ALT 23 01/05/2023 0403   ALKPHOS 37 (L) 01/05/2023 0403   BILITOT 0.5 01/05/2023 0403   GFRNONAA >60 01/07/2023 0518       Component Value Date/Time   WBC 7.4 01/05/2023 0403   RBC 3.22 (L) 01/05/2023 0403   HGB 10.2 (L) 01/05/2023 0403   HCT 30.4 (L) 01/05/2023 0403   PLT 239 01/05/2023 0403   MCV 94.4 01/05/2023 0403   MCH 31.7 01/05/2023 0403   MCHC 33.6 01/05/2023 0403   RDW 11.9 01/05/2023 0403   LYMPHSABS 1.6 01/02/2023 0602   MONOABS 0.8 01/02/2023 0602   EOSABS 0.1 01/02/2023 0602   BASOSABS 0.0 01/02/2023 0602    No results found for: "POCLITH", "LITHIUM"   No results found for: "PHENYTOIN", "PHENOBARB", "VALPROATE", "CBMZ"   .res  Assessment: Plan:    Recurrent major depression resistant to treatment (HCC)  Insomnia due to mental condition  Generalized anxiety disorder    Marked  dramatically positive response to pramipexole 0.5 mg tablets, 1-1/2 tabs 3 times daily added to Lexapro 20 mg and she is out of depression.  She is much less anxious and has been able to reduce the alprazolam just to taking it at night.  She has become much more functional again closer to her baseline.    Dep again in remission then slid back a bit since last appt. seem to get a marked initial benefit from pramipexole but has had several illnesses over the last couple weeks and she feels that she is starting backwards which scares her.  She would like to increase the pramipexole.   She has failed multiple meds as noted above including all the  usual categories of antidepressants with the exception of  MAO inhibitors.  Consider Latuda, Seroquel (not likely to be tolerated DT wt), Olanzapine Fluox Combo.  We discussed the short-term risks associated with benzodiazepines including sedation and increased fall risk among others.  Discussed long-term side effect risk including dependence, potential withdrawal symptoms, and the potential eventual dose-related risk of dementia.  But recent studies from 2020 dispute this association between benzodiazepines and dementia risk. Newer studies in 2020 do not support an association with dementia.  Consider Genesight  Continue Lexapro 20 mg dailly (helped crying and maybe anxiety but unlikely to resolve depression) Continue alprazolam  1 mg HS. H notes it helps. Increase pramipexole 0.5mg  tablets, 2 tablets 3 times daily  Disc off label use pramipexole and the dosage range 1-5 mg daily and risk higher dose including manic type sx, impulsivity.  No  problems so far.  Rec counseling .  She has a scheduled appt.  FU 3 weeks  Meredith Staggers, MD, DFAPA  Please see After Visit Summary for patient specific instructions.  Meredith Staggers, MD, DFAPA   No future appointments.     No orders of the defined types were placed in this encounter.      -------------------------------me

## 2023-02-08 DIAGNOSIS — H04123 Dry eye syndrome of bilateral lacrimal glands: Secondary | ICD-10-CM | POA: Diagnosis not present

## 2023-02-12 DIAGNOSIS — J0100 Acute maxillary sinusitis, unspecified: Secondary | ICD-10-CM | POA: Diagnosis not present

## 2023-02-20 ENCOUNTER — Telehealth: Payer: Self-pay | Admitting: Psychiatry

## 2023-02-20 NOTE — Telephone Encounter (Signed)
We just increased the pramipexole recently and I see her next week.  I do not want to increase the medicine any further until I see her again.

## 2023-02-20 NOTE — Telephone Encounter (Signed)
Patient reporting she is not feeling as good as she thinks she should. Her anxiety is the biggest concern. She said she can go to the gym in the mornings and does ok, but she had a closing to go to recently and had to take 1/4 of a Xanax, reports it took the edge off. She reports daily tremors that are helped by Xanax. She is not crying, she is getting things done, sleeping okay. Will have her grandchildren next week and she isn't excited about it like she should be.   Taking 6 mirapex, 1.5 Xanax and Lexapro 20 mg.

## 2023-02-20 NOTE — Telephone Encounter (Signed)
Patient lvm requesting a call back from the nurse. She stated she is having medications issues, and would like to discuss before her 03/02/23 appointment.  Contact # [phone]

## 2023-02-21 NOTE — Telephone Encounter (Signed)
Patient notified

## 2023-03-02 ENCOUNTER — Encounter: Payer: Self-pay | Admitting: Psychiatry

## 2023-03-02 ENCOUNTER — Ambulatory Visit (INDEPENDENT_AMBULATORY_CARE_PROVIDER_SITE_OTHER): Payer: Medicare Other | Admitting: Psychiatry

## 2023-03-02 DIAGNOSIS — F339 Major depressive disorder, recurrent, unspecified: Secondary | ICD-10-CM

## 2023-03-02 DIAGNOSIS — F411 Generalized anxiety disorder: Secondary | ICD-10-CM | POA: Diagnosis not present

## 2023-03-02 DIAGNOSIS — F5105 Insomnia due to other mental disorder: Secondary | ICD-10-CM

## 2023-03-02 NOTE — Progress Notes (Signed)
Andrea [name] [id number] [birth year] 72 y.o. Virtual Visit via Telephone Note  I connected with pt by telephone and verified that I am speaking with the correct person using two identifiers.   I discussed the limitations, risks, security and privacy concerns of performing an evaluation and management service by telephone and the availability of in person appointments. I also discussed with the patient that there may be a patient responsible charge related to this service. The patient expressed understanding and agreed to proceed.  I discussed the assessment and treatment plan with the patient. The patient was provided an opportunity to ask questions and all were answered. The patient agreed with the plan and demonstrated an understanding of the instructions.   The patient was advised to call back or seek an in-person evaluation if the symptoms worsen or if the condition fails to improve as anticipated.  I provided 30 minutes of non-face-to-face time during this encounter. The patient was located at car and the provider was located office. Session 100 PM to 130.  Subjective:   Patient ID:  Andrea [name] is a 72 y.o. (DOB [birth year]) female.  Chief Complaint:  Chief Complaint  Patient presents with   Follow-up   Depression   Anxiety    HPI Andrea [name] presents to the office today for follow-up of MDE and anxiety disorder.  seen 09/2019.  No meds were changed.  Been on Lexapro 20 since early 2017.  02/27/2020 phone call from patient: Pt experiencing more anxiety than she normally has. Pt would like to know what are some options that she has as far as her medication. Pt will be in an appt  MD response: If the anxiety is just occasional then using alprazolam as needed would be an okay thing to do.  If it is been fairly persistent for a couple of weeks or more than the simplest solution would be to increase the Lexapro to 1-1/2 tablets daily.  Historically she has done well on Lexapro 20 mg daily for  an extended period of time.  If she is under temporary stress then once that is resolved we could drop it back to 1 daily.  If this does not work as expected then she should schedule an earlier appointment. Pt response: Patient called back and she's been having temporary stress, but it's not bad. Things are great, she's working out and feeling okay for the most part. It's been going on for a bit so she agrees to the increase Lexapro 20 mg 1.5 tablets daily. She has been taking alprazolam at hs occasionally to help sleep better and it does help.   12/06/2020 appointment with the following noted:  No covid.  H Covid and recovered.   Had increased Lexapro to 30 mg for 90 days and saw a difference but started seeing weight gain and was doing oK and able to drop back and been OK. Still renovating a house for a year.  Sold her house and mourns the house. Calmed down.  Kept 3 gkids and dogs 10 days.   Andrea [name] Hopes for new house May 1. Rare alprazolam for HS. Stress moving to condo and would have crying spell over the move and some problems she was running into dealing with it.  Hard with change.  But taking time to adjust to the idea of moving from her house of 23 years.  Initially refused but he left it up to her and she's come to peace about it.  Andrea [name].  Good response to medication and no SE.  Exercising is good medicine.  If doesn't then doesn't feel as good. Satisfied. Plan: no med changes  08/01/21  appt noted:  seen with D Andrea [name] died Aug 10, 2023.  A lot of ups and downs per D.  Hard dips. Got worse when retired and dealing with the new  house.  Had trouble getting rid of things from the old house causing regrets over sellling the house.  D notes doesn't do well with instability  and stressors.  Ruminates on past mistakes. Blamed [name] for the  difficulty renovating the house.  Neg self talk and beats herself up. When not enough sleep will get depressed.  Does better if busy.   Dreads  things.  Patient denies difficulty with sleep initiation or maintenance. Denies appetite disturbance.  Patient reports that energy and motivation have been good. Patient denies any difficulty with concentration.  Patient denies any suicidal ideation.  Plan: Abilify 2.5 mg daily for a week, then 5 mg daily. Continue Lexapro 20 mg daily  08/31/2021 appt noted: It worked immediately.  Insomnia with EMA. Normally 9 hours.  It's like I'm high.  Not tired.  Average 3-4 hours. Not depressed and no crazy negative thoughts.  Good response.  Energetic but up in middle of night. Plan: Okay to stop Abilify because of insomnia.   09/29/2021 appointment with the following noted: Parties were great.  Sleep problem resolved.  A lot of rest at the beach. Maybe the last week or 2 less motivation and socialization.  Not laying in the bed.  Just not as good as she was.  Not as outgoing as normal. No alcohol now.  Only had 1-2 at night.  Not ruminating.   Willing to restart Abilify at lower dose 2 mg daily. 3 grandsons  Plan: Relapse depression recurs she can resume Abilify  but lower dose 2 mg daily or every other day to try to avoid the insomnia where she can contact our office and we can discussed the possibility of an alternative antidepressant such as Trintellix or duloxetine.  12/28/2021 appointment noted: Several phone calls since she was here.  Abilify plus Lexapro failed.  Switch to duloxetine 90 mg which she started 12/02/2018 2023 Started lamotrigine. Also started Rexulti Sister has had cycling depression that has responded well to lamotrigine with poor response to SSRIs [name] with a little dementia. SE tremor 90 mg duloxetine, ? Wt gain over a few weeks.. Exercise and wt control Social isolation, low self esteem, worry about the way she looks and not usually that way. Crying better with duloxetine.  More dependent on H.   Anhedonia.  Black hole.  Never this low. No SI Having to use Xanax.   Asks about  day treatment and TMS Plan: rec TMS Continue lamotrigine as prescribed Increase Rexulti to 1 mg daily Switch to Trintellix:  reduce duloxetine to 2 of the 30 mg capsules and start Trintellix 5 mg daily for 5 days,  Then Increase Trintellix to 10 mg daily and reduce duloxetine to 1 capsule for 1 week. Then stop duloxetine.  02/15/22 appt noted: On Trintellix , Rexulti 1 and lamotrigine I feel good.  Walks 3 miles daily is great for mental health. TMS would not be covered by [facility]. Started feeling better after a week or 2 after the last visit. SE appetite increased. No nausea. Not crying anymore. Sleeping well and no longer ruminates. Plan: Continue Trintellix but DC Rexulti due to weight  gain.  Continue lamotrigine  03/06/2022 appointment with the following noted: Last 2 weeks has been very sad and nervous. Trintellix was increased to 20 mg daily  03/10/2022 complaining of ongoing depression, feeling jittery, negative thinking, early morning awakening, ruminating about past decisions.  Wanting to consider TMS because she is desperate for improvement. Plan we will schedule urgent appointment  03/15/22 urgent appt noted:  seen with Andrea [name] Very depressed, worst ever.  Don't want to be alone, ongoing worry, ruminating on past decisions.  Racing negative thoughts.  No motivation.  Trouble staying asleep.  Getting 4 hours and used to 9 hours. Dread getting up. Using Xanax Increased Trintellix to 20 mg 9 days ago. H agrees sleep is critical. Isolating.   Need to be better by July 11. Plan: Clonazepam 1 mg HS. Continue trintellix 20 mg daily she is only been on this dose 9 days and it needs more time. Re lamotrigine and increase to 150 mg daily.Leafy Kindle 1.5 mg every other day Option TMS  03/24/22 appt noted: So much better.  Thinking straighter and not negative and not sad.  Sleeping better 8-9 hours.. No crying. H notices progressively better every day.  Mind clarity is much better.    No SE noted.  No hangover.  No nausea. Found a therapist, Krystal Eaton.   Feels well enough to go to [place] and kind of excited.  Back July 16.   Plan: Clonazepam 1 mg HS. This resolved insomnia without SE Continue trintellix 20 mg daily she is only been on this dose 20 days and it needs more time. Re lamotriginecontinue 150 mg daily.Leafy Kindle 1.5 mg every other day Option TMS  04/12/22 appt noted: Great.  Went to [place].  Was herself and enjoyed it.  Anxiety was managed.   [facility] and liked her. 3# wt gain.   Patient reports stable mood and denies depressed or irritable moods.  Patient denies any recent difficulty with anxiety.  Patient denies difficulty with sleep initiation or maintenance. Denies appetite disturbance.  Patient reports that energy and motivation have been good.  Patient denies any difficulty with concentration.  Patient denies any suicidal ideation. Sleeping well than not anxious. Plan: Trintellix 20  07/10/22 TC depression returned recently.  08/15/22 appt noted: Still blunted  and diminished motivation.  Some days better than others.  Going to gym.  Partially better.   Still has a lot of anxiety and doesn't want to be alone. Functioning but not normal.  Dreads parties.   SE tremor for a couple of week.sleep great.  Dep 7/10. Current Vraylar 1.5 mg daily, increase lamotrigine 200 mg daily, Trintellix 20 , clonazepam 0.5 mg HS Plan: Reduce Clonazepam 1/2  mg HS. This resolved insomnia without SE Continue trintellix 20 mg daily only mildly effective. lamotrigine continue 200 mg daily.Jerrilyn Cairo DT lost response Auvelity Stop Vraylar After Thanksgiving stop Trintellix Wait 3 days then [employer] 1 in the morning for 1 week,  and if no side effects then increase Auvelity to 1 in the AM and 1 in the PM  09/04/22 TC: Complaining of persistent depression and wanting to do something else to help.  Added lithium CR 300 mg  nightly  09/11/2022 phone call complaining of no improvement with Auvelity and lithium.  Appointment was moved up.  09/13/22 appt noted: Nothing changed. No better and no worse.  Awakens sad and tearful.  Sleeps well. Pushing  herself to the gym.  Has a trainer. On clonazepam 0.5 mg HS She remains anxious when alone for no apparent reason.  Feelings of inferiority.  She is normally extroverted but now she is wanting to isolate from people.  All tasks seem monumental.  No enjoyment and not looking forward to anything.  Everything is a Personal assistant.  She remains generally worried and anxious which is not typical for her. Plan: Clonazepam 1/2  mg HS. This resolved insomnia without SE Stop Auvelity Start nortriptyline 1 of the 25 mg capsules at night for 4 nights then 2 at night for 4 nights then 3 at night, then wait 1 week and get the blood test in the morning at [facility] Reduce lamotrigine to 1 and 1/2 tablets daily  Lamotrigine has not been helpful to him 100 mg daily.  Start reducing lamotrigine 150 mg daily and will continue to taper at next appointment.  09/27/21 urgent appt :  she wanted urgent appt bc desperate to feel better.  Seen with H Called at least twice since here for the same reasons of depression. Not sleeping well even with clonazepam 1 mg HS. Doesn't want to be alone. Shakey.   On lithium 300 mg daily, nortriptyline 75 mg HS.   Doesn't want to live like this but not acutely suicidal. Tolerating meds. Plan: pursue [facility] protocol TMS at [facility] bc faster optin than alternatives  10/30/22 TC: finished 50 TMS tx in the week and wants appt ASAP bc still struggling with crying and depresssion..  11/03/22 urgent appt : Completed TMS last week. Tue-Thur better days and then Friday nose-dived. Not done well since got back.   H notes high anxiety and crying in AM and PM and doesn't want to be alone.  Xanxax seems to help. Xanax helps and taking 0.5 mg AM and 1 mg HS H notes memory is  sharper and she's more alert after treatment. Very anxioius all the time.  Get up sad.  Exercising. Sleep good with Xanax 1 mg HS. Plan: Increase Lexapro 20 mg dailly (should help crying and maybe anxiety but unlikely to resolve depression) Increase alprazolam to 0.5 mg TID and 1 mg HS for severe anxiety. H notes it helps. Pramipexole 0.25 BID for 5 days and if NR then 0.5 mg Bid off label. Reduce lamotrigine 100 mg for 2 weeks then 50 mg daily for 2 weeks then stop it. She wants to pursue Spravato if pramipexole fails  11/17/22 emergency work in appt: Pramipexole was not sent in and MD not aware until yesterday.  She is not any better and is desperate for a med change. Sleeping well and taking Xanax 1 mg HS Mornings are worse.  Not crying much.  Last Friday was a bad day and she didn't want to be alone.  But did let her H play golf once this week. Reduced lamotrigine today to 50 BID and weaning off. H says better this week than last week. Increased Lexapro to 20 mg daily. Plan: Increased Lexapro 20 mg dailly (helped crying and maybe anxiety but unlikely to resolve depression) Continue alprazolam to 0.5 mg TID prn anxiety and 1 mg HS. H notes it helps. Pramipexole 0.25 mg BID for 3 days then 0.5 mg BID Reduce lamotrigine 50 mg daily for 2 weeks then stop it. She wants to pursue Spravato if pramipexole fails  12/25/22 appt urgently. COMPLETED 1 WEEK TMS [facility] without benefit except TMS took away brain fog.   She feels need to take  Xanax 0.5 mg TID  Some sleepiness with Xanax . Mornings are better and fine while at the gym but when home gets depressed again.   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 Needs Xanax to do social things. Plan: Increased Lexapro 20 mg dailly (helped crying and maybe anxiety but unlikely to resolve depression) Continue alprazolam to 0.5 mg TID prn anxiety and 1 mg HS. H notes it helps. Change pramipexole 0.5mg  tablets, 1 tablet four times daily for 1 week,  Then if  not better 1and 1/2 tablets in the AM and dinner and 1 tablet at lunch and dinner.    4/9-4/15 hosp for ruptured appendix and missed meds: Can restart Lexapro at 1/2 tablet daily for 3 days then 1 daily. Restart 1/2 pramipexole tablet 3 times daily for 3 days then 1 tablet 3 times daily for 3 days then 1 and 1/2 tablet 3 times daily.      01/12/23 appt noted: No GI sx now.  No appetite but working on protein.   I think I'm doing real well mentally.  Has resumed meds.  Mood has been great. Pramipexole 0.5mg   1 and 1/2 TID Lexapro 20 Xanax reduced to 1 mg HS H sees a difference.   No SE with meds.   No med changes  01/25/23 appt noted: Meds:  pramipexole 0.75 mg TID, Xanax 1.5 mg HS and 0.5 mg prn, Lexapro 20 mg daily. Doing great.  Staples removed and healing from surgery appendectomy. Back in the gym helps her.   Satisfied with meds. Getting out socially and feeling like her old self.  Andrea's H Brad went to ER vomiting blood.   Plan: no med changes  02/07/23 appt noted: Back from [facility] yesterday.  Had ear infection.  Then conjunctivitis.  Eyes hurt.   Before the surgery was getting better and feels like she is sliding back some.  Gradually a little worse since here.  Wonders if she could get better with joy and social drive.   Off Abx .  Sleep is good with meds.  With depression then gets anxious too.  Is functioning.   Meds:  pramipexole 0.75 mg TID, Xanax 1.5 mg HS and 0.5 mg prn, Lexapro 20 mg daily. Consistent and no SE. Plan: Continue Lexapro 20 mg dailly (helped crying and maybe anxiety but unlikely to resolve depression) Continue alprazolam  1 mg HS. H notes it helps. Increase pramipexole 0.5mg  tablets, 2 tablets 3 times daily  01/31/23 TC:Franchot Erichsen, CMA  to Me     02/20/23  4:56 PM Note Patient reporting she is not feeling as good as she thinks she should. Her anxiety is the biggest concern. She said she can go to the gym in the mornings and does ok, but she had a  closing to go to recently and had to take 1/4 of a Xanax, reports it took the edge off. She reports daily tremors that are helped by Xanax. She is not crying, she is getting things done, sleeping okay. Will have her grandchildren next week and she isn't excited about it like she should be.    Taking 6 mirapex, 1.5 Xanax and Lexapro 20 mg.     MD resp:  CC   02/20/23  5:48 PM Note We just increased the pramipexole recently and I see her next week.  I do not want to increase the medicine any further until I see her again.      03/02/23 urgent appt noted: Some improvement with  incr pramipexole 1 mg TID. Before felt worse in AM now feels better in AM and goes to gym. Otherwise no excitement.  Hard to make decisions. Less social and smiling.  Subdued more than normal.  Not confident.  Not enjoying present and ruminates in past.   No SE.  No impulsivity Has tremor for awhile .  Is mild for a month. Hard to conc.  Brain fog was better with TMS but is back.   ECT-MADRS    Flowsheet Row Office Visit from 11/03/2022 in [facility]  MADRS Total Score 40      PHQ2-9    Flowsheet Row Clinical Support from 02/24/2022 in [facility] Video Visit from 06/14/2021 in [facility] [place]  PHQ-2 Total Score 0 0  PHQ-9 Total Score -- 0      Flowsheet Row ED from 01/31/2023 in [facility] at [facility]  ED to Hosp-Admission (Discharged) from 01/02/2023 in [facility] ED from 12/03/2022 in [facility] at [facility]   C-SSRS RISK CATEGORY No Risk No Risk No Risk      Past Psychiatric Medication Trials: Sertraline, fluoxetine, citalopram 50, nefazodone, Lexapro 30 weight gain,  Viibryd 30, Wellbutrin 300, Duloxetine 90 SE tremor, mirtazapine,  Trintellix 20 little response Auvelity twice daily for 3 weeks no response Nortriptyline 75 level 86 NR Auvelity BID   3 weeks NR  Lamotrigine 200 no response Abilify, Rexulti 0.5 Vraylar 1.5 daily tremor and lost response Lithium 300 shakes  methyl folate buspirone,  alprazolam, clonazepam, Under the care of [facility] since January 2001  Sister does well on lamotrigine.  Review of Systems:  Review of Systems  Constitutional:  Negative for appetite change and fatigue.  Cardiovascular:  Negative for palpitations.  Gastrointestinal:  Negative for nausea.  Neurological:  Positive for tremors. Negative for dizziness and light-headedness.  Psychiatric/Behavioral:  Positive for dysphoric mood. Negative for behavioral problems, confusion, decreased concentration, hallucinations, self-injury, sleep disturbance and suicidal ideas. The patient is not nervous/anxious and is not hyperactive.     Medications: I have reviewed the patient's current medications.  Current Outpatient Medications  Medication Sig Dispense Refill   ALPRAZolam (XANAX) 1 MG tablet 1/2 tablet 3 times daily as needed for anxiety and 1 tablet at night 75 tablet 1   atorvastatin (LIPITOR) 20 MG tablet Take 20 mg by mouth.      calcium citrate-vitamin D (CITRACAL+D) 315-200 MG-UNIT tablet Take 1 tablet by mouth daily.     escitalopram (LEXAPRO) 20 MG tablet TAKE 1 TABLET BY MOUTH EVERY DAY 90 tablet 0   fluticasone (FLONASE) 50 MCG/ACT nasal spray fluticasone propionate 50 mcg/actuation nasal spray,suspension     levothyroxine (SYNTHROID, LEVOTHROID) 50 MCG tablet Take 50 mcg by mouth daily before breakfast.     pramipexole (MIRAPEX) 0.5 MG tablet Take 1.5 tablets (0.75 mg total) by mouth 3 (three) times daily. (Patient taking differently: Take 1 mg by mouth 3 (three) times daily.) 400 tablet 0   raloxifene (EVISTA) 60 MG tablet Take 60 mg by mouth daily.     XIIDRA 5 % SOLN Place 1 drop into both eyes daily.     azithromycin (ZITHROMAX) 250 MG tablet Take 1 tablet (250 mg total) by mouth daily. Take first 2 tablets  together, then 1 every day until finished. (Patient not taking: Reported on 03/02/2023) 6 tablet 0   No current facility-administered medications for  this visit.    Medication Side Effects: None  Allergies:  Allergies  Allergen Reactions   Hydrocodone-Acetaminophen Other (See Comments)    Passes out   Morphine Other (See Comments)    Passes out    Past Medical History:  Diagnosis Date   GAD (generalized anxiety disorder)    Hyperlipidemia    Hypothyroidism    Osteopenia    Sinusitis     Family History  Problem Relation Age of Onset   Arthritis Mother    COPD Mother    Hypercholesterolemia Mother    Dementia Mother    Stroke Mother    Prostate cancer Brother    Cancer Maternal Grandmother        Oral    Social History   Socioeconomic History   Marital status: Married    Spouse name: Not on file   Number of children: Not on file   Years of education: Not on file   Highest education level: Not on file  Occupational History   Not on file  Tobacco Use   Smoking status: Never   Smokeless tobacco: Never  Vaping Use   Vaping Use: Never used  Substance and Sexual Activity   Alcohol use: Not Currently    Comment: social   Drug use: Never   Sexual activity: Not on file  Other Topics Concern   Not on file  Social History Narrative   Married.   1 child. 2 grandchildren.   Retired Once worked as a Psychologist, sport and exercise.   Enjoys exercising, spending time with family   Social Determinants of Health   Financial Resource Strain: Low Risk  (02/24/2022)   Overall Financial Resource Strain (CARDIA)    Difficulty of Paying Living Expenses: Not hard at all  Food Insecurity: No Food Insecurity (01/08/2023)   Hunger Vital Sign    Worried About Running Out of Food in the Last Year: Never true    Ran Out of Food in the Last Year: Never true  Transportation Needs: No Transportation Needs (01/08/2023)   PRAPARE - Administrator, Civil Service (Medical): No    Lack of  Transportation (Non-Medical): No  Physical Activity: Sufficiently Active (02/24/2022)   Exercise Vital Sign    Days of Exercise per Week: 5 days    Minutes of Exercise per Session: 90 min  Stress: No Stress Concern Present (02/24/2022)   [employer] of Occupational Health - Occupational Stress Questionnaire    Feeling of Stress : Not at all  Social Connections: Not on file  Intimate Partner Violence: Not At Risk (01/02/2023)   Humiliation, Afraid, Rape, and Kick questionnaire    Fear of Current or Ex-Partner: No    Emotionally Abused: No    Physically Abused: No    Sexually Abused: No    Past Medical History, Surgical history, Social history, and Family history were reviewed and updated as appropriate.   3 grandsons.  Please see review of systems for further details on the patient's review from today.   Objective:   Physical Exam:  There were no vitals taken for this visit.  Physical Exam Neurological:     Mental Status: She is alert and oriented to person, place, and time.     Cranial Nerves: No dysarthria.  Psychiatric:        Attention and Perception: Attention and perception normal.        Mood and Affect: Mood is anxious and depressed.        Speech:  Speech normal.        Behavior: Behavior is cooperative.        Thought Content: Thought content normal. Thought content is not paranoid or delusional. Thought content does not include homicidal or suicidal ideation. Thought content does not include suicidal plan.        Cognition and Memory: Cognition and memory normal.        Judgment: Judgment normal.     Comments: Insight intact Continued moderate dep with anhedonia     Lab Review:     Component Value Date/Time   NA 132 (L) 01/07/2023 0518   K 3.6 01/07/2023 0518   CL 102 01/07/2023 0518   CO2 25 01/07/2023 0518   GLUCOSE 97 01/07/2023 0518   BUN 5 (L) 01/07/2023 0518   CREATININE 0.54 01/07/2023 0518   CALCIUM 8.1 (L) 01/07/2023 0518   PROT 5.4 (L)  01/05/2023 0403   ALBUMIN 2.7 (L) 01/05/2023 0403   AST 24 01/05/2023 0403   ALT 23 01/05/2023 0403   ALKPHOS 37 (L) 01/05/2023 0403   BILITOT 0.5 01/05/2023 0403   GFRNONAA >60 01/07/2023 0518       Component Value Date/Time   WBC 7.4 01/05/2023 0403   RBC 3.22 (L) 01/05/2023 0403   HGB 10.2 (L) 01/05/2023 0403   HCT 30.4 (L) 01/05/2023 0403   PLT 239 01/05/2023 0403   MCV 94.4 01/05/2023 0403   MCH 31.7 01/05/2023 0403   MCHC 33.6 01/05/2023 0403   RDW 11.9 01/05/2023 0403   LYMPHSABS 1.6 01/02/2023 0602   MONOABS 0.8 01/02/2023 0602   EOSABS 0.1 01/02/2023 0602   BASOSABS 0.0 01/02/2023 0602    No results found for: "POCLITH", "LITHIUM"   No results found for: "PHENYTOIN", "PHENOBARB", "VALPROATE", "CBMZ"   .res Assessment: Plan:    Recurrent major depression resistant to treatment (HCC)  Generalized anxiety disorder  Insomnia due to mental condition    Marked  dramatically positive response to pramipexole 0.5 mg tablets, 1-1/2 tabs 3 times daily added to Lexapro 20 mg and she was out of depression.  She has regressed again despite increase Pramipexole to 3 mg daily.   No reason for depression.  Dep again in remission then slid back. She would like to increase the pramipexole.   She has failed multiple meds as noted above including all the usual categories of antidepressants with the exception of  MAO inhibitors.  Consider Latuda, Seroquel (not likely to be tolerated DT wt), Olanzapine Fluox Combo. Consider Spravato   We discussed the short-term risks associated with benzodiazepines including sedation and increased fall risk among others.  Discussed long-term side effect risk including dependence, potential withdrawal symptoms, and the potential eventual dose-related risk of dementia.  But recent studies from 2020 dispute this association between benzodiazepines and dementia risk. Newer studies in 2020 do not support an association with dementia.  Consider  Genesight  Continue Lexapro 20 mg dailly (helped crying and maybe anxiety but unlikely to resolve depression) Continue alprazolam  1 mg HS. H notes it helps. Increase pramipexole 1 mg QID times daily  Disc off label use pramipexole and the dosage range 1-5 mg daily and risk higher dose including manic type sx, impulsivity.  No  problems so far.  Rec counseling .  She has a scheduled appt.  FU 4 weeks  Meredith Staggers, MD, DFAPA  Please see After Visit Summary for patient specific instructions.  Meredith Staggers, MD, DFAPA   Future Appointments  Date Time Provider Department  Center  04/04/2023  2:30 PM Cottle, Steva Ready., MD [facility] None       No orders of the defined types were placed in this encounter.      -------------------------------me

## 2023-03-16 ENCOUNTER — Ambulatory Visit (INDEPENDENT_AMBULATORY_CARE_PROVIDER_SITE_OTHER): Payer: Medicare Other | Admitting: Psychiatry

## 2023-03-16 ENCOUNTER — Encounter: Payer: Self-pay | Admitting: Psychiatry

## 2023-03-16 DIAGNOSIS — F339 Major depressive disorder, recurrent, unspecified: Secondary | ICD-10-CM | POA: Diagnosis not present

## 2023-03-16 DIAGNOSIS — F411 Generalized anxiety disorder: Secondary | ICD-10-CM

## 2023-03-16 DIAGNOSIS — F5105 Insomnia due to other mental disorder: Secondary | ICD-10-CM

## 2023-03-16 MED ORDER — LURASIDONE HCL 40 MG PO TABS
ORAL_TABLET | ORAL | 0 refills | Status: DC
Start: 2023-03-16 — End: 2023-04-13

## 2023-03-16 NOTE — Progress Notes (Signed)
Andrea [name] [id number] 29-Jul-1951 72 y.o. Virtual Visit via Telephone Note  I connected with pt by telephone and verified that I am speaking with the correct person using two identifiers.   I discussed the limitations, risks, security and privacy concerns of performing an evaluation and management service by telephone and the availability of in person appointments. I also discussed with the patient that there may be a patient responsible charge related to this service. The patient expressed understanding and agreed to proceed.  I discussed the assessment and treatment plan with the patient. The patient was provided an opportunity to ask questions and all were answered. The patient agreed with the plan and demonstrated an understanding of the instructions.   The patient was advised to call back or seek an in-person evaluation if the symptoms worsen or if the condition fails to improve as anticipated.  I provided 30 minutes of non-face-to-face time during this encounter. The patient was located at car and the provider was located office. Session 200-230  Subjective:   Patient ID:  Andrea [name] is a 72 y.o. (DOB September 08, 1951) female.  Chief Complaint:  Chief Complaint  Patient presents with   Follow-up   Depression   Anxiety    HPI HAE AHLERS presents to the office today for follow-up of MDE and anxiety disorder.  seen 09/2019.  No meds were changed.  Been on Lexapro 20 since early 2017.  02/27/2020 phone call from patient: Pt experiencing more anxiety than she normally has. Pt would like to know what are some options that she has as far as her medication. Pt will be in an appt  MD response: If the anxiety is just occasional then using alprazolam as needed would be an okay thing to do.  If it is been fairly persistent for a couple of weeks or more than the simplest solution would be to increase the Lexapro to 1-1/2 tablets daily.  Historically she has done well on Lexapro 20 mg daily for an  extended period of time.  If she is under temporary stress then once that is resolved we could drop it back to 1 daily.  If this does not work as expected then she should schedule an earlier appointment. Pt response: Patient called back and she's been having temporary stress, but it's not bad. Things are great, she's working out and feeling okay for the most part. It's been going on for a bit so she agrees to the increase Lexapro 20 mg 1.5 tablets daily. She has been taking alprazolam at hs occasionally to help sleep better and it does help.   12/06/2020 appointment with the following noted:  No covid.  H Covid and recovered.   Had increased Lexapro to 30 mg for 90 days and saw a difference but started seeing weight gain and was doing oK and able to drop back and been OK. Still renovating a house for a year.  Sold her house and mourns the house. Calmed down.  Kept 3 gkids and dogs 10 days.   Andrea [name] Hopes for new house May 1. Rare alprazolam for HS. Stress moving to condo and would have crying spell over the move and some problems she was running into dealing with it.  Hard with change.  But taking time to adjust to the idea of moving from her house of 23 years.  Initially refused but he left it up to her and she's come to peace about it.  Andrea [name].   Good  response to medication and no SE.  Exercising is good medicine.  If doesn't then doesn't feel as good. Satisfied. Plan: no med changes  08/01/21  appt noted:  seen with [name] died 08-17-2023.  A lot of ups and downs per D.  Hard dips. Got worse when retired and dealing with the new  house.  Had trouble getting rid of things from the old house causing regrets over sellling the house.  D notes doesn't do well with instability  and stressors.  Ruminates on past mistakes. Blamed [name] for the  difficulty renovating the house.  Neg self talk and beats herself up. When not enough sleep will get depressed.  Does better if busy.   Dreads things.   Patient denies difficulty with sleep initiation or maintenance. Denies appetite disturbance.  Patient reports that energy and motivation have been good. Patient denies any difficulty with concentration.  Patient denies any suicidal ideation.  Plan: Abilify 2.5 mg daily for a week, then 5 mg daily. Continue Lexapro 20 mg daily  08/31/2021 appt noted: It worked immediately.  Insomnia with EMA. Normally 9 hours.  It's like I'm high.  Not tired.  Average 3-4 hours. Not depressed and no crazy negative thoughts.  Good response.  Energetic but up in middle of night. Plan: Okay to stop Abilify because of insomnia.   09/29/2021 appointment with the following noted: Parties were great.  Sleep problem resolved.  A lot of rest at the beach. Maybe the last week or 2 less motivation and socialization.  Not laying in the bed.  Just not as good as she was.  Not as outgoing as normal. No alcohol now.  Only had 1-2 at night.  Not ruminating.   Willing to restart Abilify at lower dose 2 mg daily. 3 grandsons  Plan: Relapse depression recurs she can resume Abilify  but lower dose 2 mg daily or every other day to try to avoid the insomnia where she can contact our office and we can discussed the possibility of an alternative antidepressant such as Trintellix or duloxetine.  12/28/2021 appointment noted: Several phone calls since she was here.  Abilify plus Lexapro failed.  Switch to duloxetine 90 mg which she started 12/02/2018 2023 Started lamotrigine. Also started Rexulti Sister has had cycling depression that has responded well to lamotrigine with poor response to SSRIs [name] with a little dementia. SE tremor 90 mg duloxetine, ? Wt gain over a few weeks.. Exercise and wt control Social isolation, low self esteem, worry about the way she looks and not usually that way. Crying better with duloxetine.  More dependent on H.   Anhedonia.  Black hole.  Never this low. No SI Having to use Xanax.   Asks about day  treatment and TMS Plan: rec TMS Continue lamotrigine as prescribed Increase Rexulti to 1 mg daily Switch to Trintellix:  reduce duloxetine to 2 of the 30 mg capsules and start Trintellix 5 mg daily for 5 days,  Then Increase Trintellix to 10 mg daily and reduce duloxetine to 1 capsule for 1 week. Then stop duloxetine.  02/15/22 appt noted: On Trintellix , Rexulti 1 and lamotrigine I feel good.  Walks 3 miles daily is great for mental health. TMS would not be covered by [facility]. Started feeling better after a week or 2 after the last visit. SE appetite increased. No nausea. Not crying anymore. Sleeping well and no longer ruminates. Plan: Continue Trintellix but DC Rexulti due to weight gain.  Continue lamotrigine  03/06/2022 appointment with the following noted: Last 2 weeks has been very sad and nervous. Trintellix was increased to 20 mg daily  03/10/2022 complaining of ongoing depression, feeling jittery, negative thinking, early morning awakening, ruminating about past decisions.  Wanting to consider TMS because she is desperate for improvement. Plan we will schedule urgent appointment  03/15/22 urgent appt noted:  seen with Andrea [name] Very depressed, worst ever.  Don't want to be alone, ongoing worry, ruminating on past decisions.  Racing negative thoughts.  No motivation.  Trouble staying asleep.  Getting 4 hours and used to 9 hours. Dread getting up. Using Xanax Increased Trintellix to 20 mg 9 days ago. H agrees sleep is critical. Isolating.   Need to be better by July 11. Plan: Clonazepam 1 mg HS. Continue trintellix 20 mg daily she is only been on this dose 9 days and it needs more time. Re lamotrigine and increase to 150 mg daily.Leafy Kindle 1.5 mg every other day Option TMS  03/24/22 appt noted: So much better.  Thinking straighter and not negative and not sad.  Sleeping better 8-9 hours.. No crying. H notices progressively better every day.  Mind clarity is much better.   No  SE noted.  No hangover.  No nausea. Found a therapist, Krystal Eaton.   Feels well enough to go to [place] and kind of excited.  Back July 16.   Plan: Clonazepam 1 mg HS. This resolved insomnia without SE Continue trintellix 20 mg daily she is only been on this dose 20 days and it needs more time. Re lamotriginecontinue 150 mg daily.Leafy Kindle 1.5 mg every other day Option TMS  04/12/22 appt noted: Great.  Went to [place].  Was herself and enjoyed it.  Anxiety was managed.   [facility] and liked her. 3# wt gain.   Patient reports stable mood and denies depressed or irritable moods.  Patient denies any recent difficulty with anxiety.  Patient denies difficulty with sleep initiation or maintenance. Denies appetite disturbance.  Patient reports that energy and motivation have been good.  Patient denies any difficulty with concentration.  Patient denies any suicidal ideation. Sleeping well than not anxious. Plan: Trintellix 20  07/10/22 TC depression returned recently.  08/15/22 appt noted: Still blunted  and diminished motivation.  Some days better than others.  Going to gym.  Partially better.   Still has a lot of anxiety and doesn't want to be alone. Functioning but not normal.  Dreads parties.   SE tremor for a couple of week.sleep great.  Dep 7/10. Current Vraylar 1.5 mg daily, increase lamotrigine 200 mg daily, Trintellix 20 , clonazepam 0.5 mg HS Plan: Reduce Clonazepam 1/2  mg HS. This resolved insomnia without SE Continue trintellix 20 mg daily only mildly effective. lamotrigine continue 200 mg daily.Jerrilyn Cairo DT lost response Auvelity Stop Vraylar After Thanksgiving stop Trintellix Wait 3 days then [employer] 1 in the morning for 1 week,  and if no side effects then increase Auvelity to 1 in the AM and 1 in the PM  09/04/22 TC: Complaining of persistent depression and wanting to do something else to help.  Added lithium CR 300 mg  nightly  09/11/2022 phone call complaining of no improvement with Auvelity and lithium.  Appointment was moved up.  09/13/22 appt noted: Nothing changed. No better and no worse.  Awakens sad and tearful.  Sleeps well. Pushing herself to  the gym.  Has a trainer. On clonazepam 0.5 mg HS She remains anxious when alone for no apparent reason.  Feelings of inferiority.  She is normally extroverted but now she is wanting to isolate from people.  All tasks seem monumental.  No enjoyment and not looking forward to anything.  Everything is a Personal assistant.  She remains generally worried and anxious which is not typical for her. Plan: Clonazepam 1/2  mg HS. This resolved insomnia without SE Stop Auvelity Start nortriptyline 1 of the 25 mg capsules at night for 4 nights then 2 at night for 4 nights then 3 at night, then wait 1 week and get the blood test in the morning at [facility] Reduce lamotrigine to 1 and 1/2 tablets daily  Lamotrigine has not been helpful to him 100 mg daily.  Start reducing lamotrigine 150 mg daily and will continue to taper at next appointment.  09/27/21 urgent appt :  she wanted urgent appt bc desperate to feel better.  Seen with H Called at least twice since here for the same reasons of depression. Not sleeping well even with clonazepam 1 mg HS. Doesn't want to be alone. Shakey.   On lithium 300 mg daily, nortriptyline 75 mg HS.   Doesn't want to live like this but not acutely suicidal. Tolerating meds. Plan: pursue [facility] protocol TMS at [facility] bc faster optin than alternatives  10/30/22 TC: finished 50 TMS tx in the week and wants appt ASAP bc still struggling with crying and depresssion..  11/03/22 urgent appt : Completed TMS last week. Tue-Thur better days and then Friday nose-dived. Not done well since got back.   H notes high anxiety and crying in AM and PM and doesn't want to be alone.  Xanxax seems to help. Xanax helps and taking 0.5 mg AM and 1 mg HS H notes memory is  sharper and she's more alert after treatment. Very anxioius all the time.  Get up sad.  Exercising. Sleep good with Xanax 1 mg HS. Plan: Increase Lexapro 20 mg dailly (should help crying and maybe anxiety but unlikely to resolve depression) Increase alprazolam to 0.5 mg TID and 1 mg HS for severe anxiety. H notes it helps. Pramipexole 0.25 BID for 5 days and if NR then 0.5 mg Bid off label. Reduce lamotrigine 100 mg for 2 weeks then 50 mg daily for 2 weeks then stop it. She wants to pursue Spravato if pramipexole fails  11/17/22 emergency work in appt: Pramipexole was not sent in and MD not aware until yesterday.  She is not any better and is desperate for a med change. Sleeping well and taking Xanax 1 mg HS Mornings are worse.  Not crying much.  Last Friday was a bad day and she didn't want to be alone.  But did let her H play golf once this week. Reduced lamotrigine today to 50 BID and weaning off. H says better this week than last week. Increased Lexapro to 20 mg daily. Plan: Increased Lexapro 20 mg dailly (helped crying and maybe anxiety but unlikely to resolve depression) Continue alprazolam to 0.5 mg TID prn anxiety and 1 mg HS. H notes it helps. Pramipexole 0.25 mg BID for 3 days then 0.5 mg BID Reduce lamotrigine 50 mg daily for 2 weeks then stop it. She wants to pursue Spravato if pramipexole fails  12/25/22 appt urgently. COMPLETED 1 WEEK TMS [facility] without benefit except TMS took away brain fog.   She feels need to take Xanax 0.5  mg TID  Some sleepiness with Xanax . Mornings are better and fine while at the gym but when home gets depressed again.   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 Needs Xanax to do social things. Plan: Increased Lexapro 20 mg dailly (helped crying and maybe anxiety but unlikely to resolve depression) Continue alprazolam to 0.5 mg TID prn anxiety and 1 mg HS. H notes it helps. Change pramipexole 0.5mg  tablets, 1 tablet four times daily for 1 week,  Then if  not better 1and 1/2 tablets in the AM and dinner and 1 tablet at lunch and dinner.    4/9-4/15 hosp for ruptured appendix and missed meds: Can restart Lexapro at 1/2 tablet daily for 3 days then 1 daily. Restart 1/2 pramipexole tablet 3 times daily for 3 days then 1 tablet 3 times daily for 3 days then 1 and 1/2 tablet 3 times daily.      01/12/23 appt noted: No GI sx now.  No appetite but working on protein.   I think I'm doing real well mentally.  Has resumed meds.  Mood has been great. Pramipexole 0.5mg   1 and 1/2 TID Lexapro 20 Xanax reduced to 1 mg HS H sees a difference.   No SE with meds.   No med changes  01/25/23 appt noted: Meds:  pramipexole 0.75 mg TID, Xanax 1.5 mg HS and 0.5 mg prn, Lexapro 20 mg daily. Doing great.  Staples removed and healing from surgery appendectomy. Back in the gym helps her.   Satisfied with meds. Getting out socially and feeling like her old self.  Chelsea's H Brad went to ER vomiting blood.   Plan: no med changes  02/07/23 appt noted: Back from [facility] yesterday.  Had ear infection.  Then conjunctivitis.  Eyes hurt.   Before the surgery was getting better and feels like she is sliding back some.  Gradually a little worse since here.  Wonders if she could get better with joy and social drive.   Off Abx .  Sleep is good with meds.  With depression then gets anxious too.  Is functioning.   Meds:  pramipexole 0.75 mg TID, Xanax 1.5 mg HS and 0.5 mg prn, Lexapro 20 mg daily. Consistent and no SE. Plan: Continue Lexapro 20 mg dailly (helped crying and maybe anxiety but unlikely to resolve depression) Continue alprazolam  1 mg HS. H notes it helps. Increase pramipexole 0.5mg  tablets, 2 tablets 3 times daily  01/31/23 TC:Franchot Erichsen, CMA  to Me     02/20/23  4:56 PM Note Patient reporting she is not feeling as good as she thinks she should. Her anxiety is the biggest concern. She said she can go to the gym in the mornings and does ok, but she had a  closing to go to recently and had to take 1/4 of a Xanax, reports it took the edge off. She reports daily tremors that are helped by Xanax. She is not crying, she is getting things done, sleeping okay. Will have her grandchildren next week and she isn't excited about it like she should be.    Taking 6 mirapex, 1.5 Xanax and Lexapro 20 mg.     MD resp:  CC   02/20/23  5:48 PM Note We just increased the pramipexole recently and I see her next week.  I do not want to increase the medicine any further until I see her again.      03/02/23 urgent appt noted: Some improvement with incr pramipexole  1 mg TID. Before felt worse in AM now feels better in AM and goes to gym. Otherwise no excitement.  Hard to make decisions. Less social and smiling.  Subdued more than normal.  Not confident.  Not enjoying present and ruminates in past.   No SE.  No impulsivity Has tremor for awhile .  Is mild for a month. Hard to conc.  Brain fog was better with TMS but is back. Continue Lexapro 20 mg dailly (helped crying and maybe anxiety but unlikely to resolve depression) Continue alprazolam  1 mg HS. H notes it helps. Increase pramipexole 1 mg QID times daily  03/16/23 appt noted: Meds as above SE: If not enough food makes her excitable, urgent, talking fast.  Does it a bit too much.  Energy fine.   Feels better in the AM at the gym and when home a dark shadow comes over her.   Still feels dep overall.  Went to work with friends and didn't feel natural and didn't enjoy it like she should.  Pushes herself to stay active.   Takes 1.5 mg Xanax HS and then awakens and takes another 0.5 mg HS but gets enough sleep. A lot of brain fog.  Feels inferior and doesn't usually feel that way. Infrequent crying spells.   ECT-MADRS    Flowsheet Row Office Visit from 11/03/2022 in [facility]  MADRS Total Score 40      PHQ2-9    Flowsheet Row Clinical Support from 02/24/2022 in [facility] Video Visit from 06/14/2021 in [facility] [place]  PHQ-2 Total Score 0 0  PHQ-9 Total Score -- 0      Flowsheet Row ED from 01/31/2023 in [facility]  ED to Hosp-Admission (Discharged) from 01/02/2023 in [facility] ED from 12/03/2022 in [facility]   C-SSRS RISK CATEGORY No Risk No Risk No Risk      Past Psychiatric Medication Trials: Sertraline, fluoxetine, citalopram 50, nefazodone,  Lexapro 30 weight gain,  Viibryd 30, Wellbutrin 300 , Duloxetine 90 SE tremor, mirtazapine,  Trintellix 20 little response Auvelity twice daily for 3 weeks no response Nortriptyline 75 level 86 NR Pramipexole 1 mg QID  poor resp and felt hyped  TMS  Lamotrigine 200 no response Abilify, Rexulti 0.5 Vraylar 1.5 daily tremor and lost response Lithium 300 shakes  methyl folate buspirone,  alprazolam, clonazepam, Under the care of [facility] since January 2001  Sister does well on lamotrigine.  Review of Systems:  Review of Systems  Constitutional:  Negative for appetite change.  Cardiovascular:  Negative for palpitations.  Gastrointestinal:  Negative for nausea.  Neurological:  Positive for tremors. Negative for dizziness and light-headedness.  Psychiatric/Behavioral:  Positive for dysphoric mood. Negative for behavioral problems, confusion, decreased concentration, hallucinations, self-injury, sleep disturbance and suicidal ideas. The patient is not nervous/anxious and is not hyperactive.     Medications: I have reviewed the patient's current medications.  Current Outpatient Medications  Medication Sig Dispense Refill   ALPRAZolam (XANAX) 1 MG tablet 1/2 tablet 3 times daily as needed for anxiety and 1 tablet at night 75 tablet 1   atorvastatin (LIPITOR) 20 MG tablet Take 20 mg by mouth.      azithromycin (ZITHROMAX) 250 MG  tablet Take 1 tablet (250 mg total) by mouth daily. Take first 2 tablets together, then 1 every day until  finished. 6 tablet 0   calcium citrate-vitamin D (CITRACAL+D) 315-200 MG-UNIT tablet Take 1 tablet by mouth daily.     escitalopram (LEXAPRO) 20 MG tablet TAKE 1 TABLET BY MOUTH EVERY DAY 90 tablet 0   fluticasone (FLONASE) 50 MCG/ACT nasal spray fluticasone propionate 50 mcg/actuation nasal spray,suspension     levothyroxine (SYNTHROID, LEVOTHROID) 50 MCG tablet Take 50 mcg by mouth daily before breakfast.     lurasidone (LATUDA) 40 MG TABS tablet 1/2 tablet daily for 1 week then 1 daily 30 tablet 0   raloxifene (EVISTA) 60 MG tablet Take 60 mg by mouth daily.     XIIDRA 5 % SOLN Place 1 drop into both eyes daily.     No current facility-administered medications for this visit.    Medication Side Effects: None  Allergies:  Allergies  Allergen Reactions   Hydrocodone-Acetaminophen Other (See Comments)    Passes out   Morphine Other (See Comments)    Passes out    Past Medical History:  Diagnosis Date   GAD (generalized anxiety disorder)    Hyperlipidemia    Hypothyroidism    Osteopenia    Sinusitis     Family History  Problem Relation Age of Onset   Arthritis Mother    COPD Mother    Hypercholesterolemia Mother    Dementia Mother    Stroke Mother    Prostate cancer Brother    Cancer Maternal Grandmother        Oral    Social History   Socioeconomic History   Marital status: Married    Spouse name: Not on file   Number of children: Not on file   Years of education: Not on file   Highest education level: Not on file  Occupational History   Not on file  Tobacco Use   Smoking status: Never   Smokeless tobacco: Never  Vaping Use   Vaping Use: Never used  Substance and Sexual Activity   Alcohol use: Not Currently    Comment: social   Drug use: Never   Sexual activity: Not on file  Other Topics Concern   Not on file  Social History Narrative   Married.    1 child. 2 grandchildren.   Retired Once worked as a Psychologist, sport and exercise.   Enjoys exercising, spending time with family   Social Determinants of Health   Financial Resource Strain: Low Risk  (02/24/2022)   Overall Financial Resource Strain (CARDIA)    Difficulty of Paying Living Expenses: Not hard at all  Food Insecurity: No Food Insecurity (01/08/2023)   Hunger Vital Sign    Worried About Running Out of Food in the Last Year: Never true    Ran Out of Food in the Last Year: Never true  Transportation Needs: No Transportation Needs (01/08/2023)   PRAPARE - Administrator, Civil Service (Medical): No    Lack of Transportation (Non-Medical): No  Physical Activity: Sufficiently Active (02/24/2022)   Exercise Vital Sign    Days of Exercise per Week: 5 days    Minutes of Exercise per Session: 90 min  Stress: No Stress Concern Present (02/24/2022)   [employer] of Occupational Health - Occupational Stress Questionnaire    Feeling of Stress : Not at all  Social Connections: Not on file  Intimate Partner Violence: Not At Risk (01/02/2023)   Humiliation, Afraid, Rape, and Kick questionnaire    Fear of Current or Ex-Partner: No    Emotionally Abused: No    Physically  Abused: No    Sexually Abused: No    Past Medical History, Surgical history, Social history, and Family history were reviewed and updated as appropriate.   3 grandsons.  Please see review of systems for further details on the patient's review from today.   Objective:   Physical Exam:  There were no vitals taken for this visit.  Physical Exam Neurological:     Mental Status: She is alert and oriented to person, place, and time.     Cranial Nerves: No dysarthria.  Psychiatric:        Attention and Perception: Attention and perception normal.        Mood and Affect: Mood is anxious and depressed.        Speech: Speech normal.        Behavior: Behavior is cooperative.        Thought Content: Thought content  normal. Thought content is not paranoid or delusional. Thought content does not include homicidal or suicidal ideation. Thought content does not include suicidal plan.        Cognition and Memory: Cognition and memory normal.        Judgment: Judgment normal.     Comments: Insight intact Continued moderate dep with anhedonia     Lab Review:     Component Value Date/Time   NA 132 (L) 01/07/2023 0518   K 3.6 01/07/2023 0518   CL 102 01/07/2023 0518   CO2 25 01/07/2023 0518   GLUCOSE 97 01/07/2023 0518   BUN 5 (L) 01/07/2023 0518   CREATININE 0.54 01/07/2023 0518   CALCIUM 8.1 (L) 01/07/2023 0518   PROT 5.4 (L) 01/05/2023 0403   ALBUMIN 2.7 (L) 01/05/2023 0403   AST 24 01/05/2023 0403   ALT 23 01/05/2023 0403   ALKPHOS 37 (L) 01/05/2023 0403   BILITOT 0.5 01/05/2023 0403   GFRNONAA >60 01/07/2023 0518       Component Value Date/Time   WBC 7.4 01/05/2023 0403   RBC 3.22 (L) 01/05/2023 0403   HGB 10.2 (L) 01/05/2023 0403   HCT 30.4 (L) 01/05/2023 0403   PLT 239 01/05/2023 0403   MCV 94.4 01/05/2023 0403   MCH 31.7 01/05/2023 0403   MCHC 33.6 01/05/2023 0403   RDW 11.9 01/05/2023 0403   LYMPHSABS 1.6 01/02/2023 0602   MONOABS 0.8 01/02/2023 0602   EOSABS 0.1 01/02/2023 0602   BASOSABS 0.0 01/02/2023 0602    No results found for: "POCLITH", "LITHIUM"   No results found for: "PHENYTOIN", "PHENOBARB", "VALPROATE", "CBMZ"   .res Assessment: Plan:    Recurrent 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  No reason for depression.  Dep again in remission then slid back.    She has failed multiple meds as noted above including all the usual categories of antidepressants with the exception of  MAO inhibitors.  Consider Latuda, Seroquel (not likely to be tolerated DT wt), Olanzapine Fluox Combo.   Consider Spravato,   Extensive discussion of next steps.     We discussed the short-term risks associated with benzodiazepines including sedation and increased fall risk among others.  Discussed long-term side effect risk including dependence, potential withdrawal symptoms, and the potential eventual dose-related risk of dementia.  But recent studies from 2020 dispute this association between benzodiazepines and dementia risk. Newer studies in 2020 do not support an association with dementia.  Consider Genesight  Continue Lexapro 20 mg dailly (helped crying and maybe anxiety but unlikely to resolve depression) Continue alprazolam  1 mg HS. H notes it helps. Reduce pramipexole 1 mg TID for 3 days, then 0.5 mg QID for 3 days, then 0.5 mg BID for 3 days, then 0.25 mg BID for 3 days, then stop it.  Now start Latuda (lurasidone) 40 mg 1/2 tablet for 1 week.  If not benefit then 1 tablet daily with 350 calories  Rec counseling .  She has a scheduled appt.  FU 4 weeks  Meredith Staggers, MD, DFAPA  Please see After Visit Summary for patient specific instructions.  Meredith Staggers, MD, DFAPA   Future Appointments  Date Time Provider Department Center  04/04/2023  2:30 PM Cottle, Steva Ready., MD [facility] None       No orders of the defined types were placed in this encounter.      -------------------------------me

## 2023-03-16 NOTE — Patient Instructions (Signed)
Reduce pramipexole 1 mg TID for 3 days, then 0.5 mg QID for 3 days, then 0.5 mg BID for 3 days, then 0.25 mg BID for 3 days, then stop it. Now start Latuda (lurasidone) 40 mg 1/2 tablet for 1 week.  If not benefit then 1 tablet daily with 350 calories

## 2023-03-19 ENCOUNTER — Telehealth: Payer: Self-pay

## 2023-03-19 NOTE — Telephone Encounter (Signed)
Patient had televisit 6/21.  She is reporting a deep depression. [place] is at the beach with grandchildren and said she needs to be able to take care of them.  She is on day 3 of the medication change.    Reduce pramipexole 1 mg TID for 3 days, then 0.5 mg QID for 3 days, then 0.5 mg BID for 3 days, then 0.25 mg BID for 3 days, then stop it. Now start Latuda (lurasidone) 40 mg 1/2 tablet for 1 week.  If not benefit then 1 tablet daily with 350 calories

## 2023-03-19 NOTE — Telephone Encounter (Signed)
As noted just talked with her 3 d ago.  If she is saying she is worse than on 6/21, go back up on pramipexole and don't change dosages until she gets back in town.  If she is not worse then continue with changes suggested.  There's nothing I can do to make this  better any faster.

## 2023-03-20 NOTE — Telephone Encounter (Signed)
Patient notified of recommendations. 

## 2023-03-28 ENCOUNTER — Telehealth: Payer: Self-pay

## 2023-03-28 NOTE — Telephone Encounter (Addendum)
Patient is going to [facility] on Monday and she is asking for your blessing.  Her husband and daughter had told her she is going to get some help. She said her depression and anxiety are 10/10. She denies SI but doesn't want to left alone. She completed her wean from pramipexole yesterday. She is scheduled for an in office visit on Wednesday at 2:30 and is asking what to do about that.  She is aware that you are on vacation.

## 2023-03-28 NOTE — Telephone Encounter (Signed)
I support that decision

## 2023-03-30 NOTE — Telephone Encounter (Signed)
Patient informed. She is worried about Dr. Jennelle Human not being her doctor. I reassured her that he was still going to be her provider.

## 2023-04-03 ENCOUNTER — Telehealth: Payer: Self-pay | Admitting: Psychiatry

## 2023-04-03 ENCOUNTER — Other Ambulatory Visit: Payer: Self-pay | Admitting: Psychiatry

## 2023-04-03 DIAGNOSIS — F339 Major depressive disorder, recurrent, unspecified: Secondary | ICD-10-CM

## 2023-04-03 NOTE — Telephone Encounter (Signed)
Pt's LVM @ 10:52a asking [place] to return call to her husband about getting an appt asap with Dr Jennelle Human.  I scheduled her for 8/22 and put her on the wait list.  She is doing some type of therapy, but is anxious about it.  Upon speaking to her husband on the return call to make the appt, they are asking if Dr Jennelle Human will see them on a Fri at 3:30p before 8/22.  Pls let me know and I will call them back

## 2023-04-03 NOTE — Telephone Encounter (Signed)
Patient Andrea [name] today at 4:24 requesting a call back asap. The message stated the Kasandra Knudsen is causing her SOB and dizziness. She is requesting to go back on the Dopamine.  Appointment 05/17/23 Contact # [phone]

## 2023-04-03 NOTE — Telephone Encounter (Signed)
See message from patient.   From 6/21 visit:  Reduce pramipexole 1 mg TID for 3 days, then 0.5 mg QID for 3 days, then 0.5 mg BID for 3 days, then 0.25 mg BID for 3 days, then stop it. Now start Latuda (lurasidone) 40 mg 1/2 tablet for 1 week.  If not benefit then 1 tablet daily with 350 calories.   On 6/24 she reported increased depression and per your recommendation:  As noted just talked with her 3 d ago. If she is saying she is worse than on 6/21, go back up on pramipexole and don't change dosages until she gets back in town. If she is not worse then continue with changes suggested. There's nothing I can do to make this better any faster.   She was supposed to start IOP at [facility] yesterday and husband had left a message today to see if you could see patient on a Friday before her scheduled appt.

## 2023-04-03 NOTE — Telephone Encounter (Signed)
Put her on cancellation list. In the meantime ok to schedule her on Friday 7/19 .  There are no openings this Friday or earlier to my knowledge.  If she thinks Jordan is making her worse stop it. I don't want her to restart pramipexole (she calls it dopamine) bc it didn't work and I'll only have to taper her off it later.  I am sorry these meds are not working or tolerated.  I need to see her to start anything new at this point.

## 2023-04-03 NOTE — Telephone Encounter (Signed)
Please see message from patient about appt.

## 2023-04-03 NOTE — Telephone Encounter (Signed)
Put her on cancellation list. In the meantime ok to schedule her on Friday 7/19 .  There are no openings this Friday or earlier to my knowledge.

## 2023-04-04 ENCOUNTER — Ambulatory Visit: Payer: Medicare Other | Admitting: Psychiatry

## 2023-04-04 NOTE — Telephone Encounter (Signed)
Husband notified  

## 2023-04-05 DIAGNOSIS — F329 Major depressive disorder, single episode, unspecified: Secondary | ICD-10-CM | POA: Diagnosis not present

## 2023-04-05 DIAGNOSIS — Z1152 Encounter for screening for COVID-19: Secondary | ICD-10-CM | POA: Diagnosis not present

## 2023-04-05 DIAGNOSIS — F411 Generalized anxiety disorder: Secondary | ICD-10-CM | POA: Diagnosis not present

## 2023-04-05 DIAGNOSIS — M2578 Osteophyte, vertebrae: Secondary | ICD-10-CM | POA: Diagnosis not present

## 2023-04-05 DIAGNOSIS — M50322 Other cervical disc degeneration at C5-C6 level: Secondary | ICD-10-CM | POA: Diagnosis not present

## 2023-04-05 DIAGNOSIS — F339 Major depressive disorder, recurrent, unspecified: Secondary | ICD-10-CM | POA: Diagnosis not present

## 2023-04-05 DIAGNOSIS — M5136 Other intervertebral disc degeneration, lumbar region: Secondary | ICD-10-CM | POA: Diagnosis not present

## 2023-04-05 DIAGNOSIS — F32A Depression, unspecified: Secondary | ICD-10-CM | POA: Diagnosis not present

## 2023-04-05 DIAGNOSIS — G4700 Insomnia, unspecified: Secondary | ICD-10-CM | POA: Diagnosis not present

## 2023-04-05 DIAGNOSIS — E871 Hypo-osmolality and hyponatremia: Secondary | ICD-10-CM | POA: Diagnosis not present

## 2023-04-05 DIAGNOSIS — E039 Hypothyroidism, unspecified: Secondary | ICD-10-CM | POA: Diagnosis not present

## 2023-04-05 DIAGNOSIS — F419 Anxiety disorder, unspecified: Secondary | ICD-10-CM | POA: Diagnosis not present

## 2023-04-05 DIAGNOSIS — F39 Unspecified mood [affective] disorder: Secondary | ICD-10-CM | POA: Diagnosis not present

## 2023-04-05 DIAGNOSIS — Z87891 Personal history of nicotine dependence: Secondary | ICD-10-CM | POA: Diagnosis not present

## 2023-04-05 DIAGNOSIS — Z634 Disappearance and death of family member: Secondary | ICD-10-CM | POA: Diagnosis not present

## 2023-04-05 DIAGNOSIS — F332 Major depressive disorder, recurrent severe without psychotic features: Secondary | ICD-10-CM | POA: Diagnosis not present

## 2023-04-05 DIAGNOSIS — E785 Hyperlipidemia, unspecified: Secondary | ICD-10-CM | POA: Diagnosis not present

## 2023-04-05 DIAGNOSIS — M8580 Other specified disorders of bone density and structure, unspecified site: Secondary | ICD-10-CM | POA: Diagnosis not present

## 2023-04-05 DIAGNOSIS — R45851 Suicidal ideations: Secondary | ICD-10-CM | POA: Diagnosis not present

## 2023-04-05 DIAGNOSIS — Z659 Problem related to unspecified psychosocial circumstances: Secondary | ICD-10-CM | POA: Diagnosis not present

## 2023-04-05 DIAGNOSIS — L659 Nonscarring hair loss, unspecified: Secondary | ICD-10-CM | POA: Diagnosis not present

## 2023-04-05 DIAGNOSIS — G479 Sleep disorder, unspecified: Secondary | ICD-10-CM | POA: Diagnosis not present

## 2023-04-05 DIAGNOSIS — N3090 Cystitis, unspecified without hematuria: Secondary | ICD-10-CM | POA: Diagnosis not present

## 2023-04-11 ENCOUNTER — Telehealth: Payer: Self-pay | Admitting: Psychiatry

## 2023-04-11 NOTE — Telephone Encounter (Signed)
TC from Dr. Nonie Hoyer psychiatric resident at [facility] inpatient psychiatric unit.  Patient has been admitted there but will be discharged today.  They changed her from Lexapro to sertraline but she remains depressed.  The suggested the option of ECT but the patient is refusing ECT at this time out of concern about having memory problems. She has an appointment in this office on 04/13/2023  Meredith Staggers, MD, DFAPA

## 2023-04-12 ENCOUNTER — Other Ambulatory Visit: Payer: Self-pay | Admitting: Psychiatry

## 2023-04-12 ENCOUNTER — Telehealth: Payer: Self-pay | Admitting: *Deleted

## 2023-04-12 DIAGNOSIS — F339 Major depressive disorder, recurrent, unspecified: Secondary | ICD-10-CM

## 2023-04-12 NOTE — Telephone Encounter (Signed)
Has appt. tomorrow

## 2023-04-12 NOTE — Transitions of Care (Post Inpatient/ED Visit) (Signed)
   04/12/2023  Name: Andrea [name] MRN: [id number] DOB: [birth year]  Today's TOC FU Call Status: Today's TOC FU Call Status:: Unsuccessul Call (1st Attempt) Unsuccessful Call (1st Attempt) Date: 04/12/23  Attempted to reach the patient regarding the most recent Inpatient/ED visit. Per patient she is still in her meeting and requested a call back tomorrow. Follow Up Plan: Additional outreach attempts will be made to reach the patient to complete the Transitions of Care (Post Inpatient/ED visit) call.   Gean Maidens BSN RN [facility] [phone]

## 2023-04-13 ENCOUNTER — Encounter: Payer: Self-pay | Admitting: Psychiatry

## 2023-04-13 ENCOUNTER — Ambulatory Visit (INDEPENDENT_AMBULATORY_CARE_PROVIDER_SITE_OTHER): Payer: Medicare Other | Admitting: Psychiatry

## 2023-04-13 DIAGNOSIS — F339 Major depressive disorder, recurrent, unspecified: Secondary | ICD-10-CM | POA: Diagnosis not present

## 2023-04-13 DIAGNOSIS — F5105 Insomnia due to other mental disorder: Secondary | ICD-10-CM

## 2023-04-13 DIAGNOSIS — F411 Generalized anxiety disorder: Secondary | ICD-10-CM | POA: Diagnosis not present

## 2023-04-13 DIAGNOSIS — J0100 Acute maxillary sinusitis, unspecified: Secondary | ICD-10-CM | POA: Diagnosis not present

## 2023-04-13 NOTE — Patient Instructions (Addendum)
Stop hydroxyzine Increase quetiapine 200 then 300 mg at night for sleep and depression  Stop lorazepam and return to alprazolam 1 mg at night for sleep Increase sertraline to 100 mg daily or 2 of 50 mg nightly

## 2023-04-13 NOTE — Progress Notes (Signed)
[name] [id number] 1951/05/25 72 y.o.   Subjective:   Patient ID:  Andrea [name] is a 72 y.o. (DOB 02-Aug-1951) female.  Chief Complaint:  Chief Complaint  Patient presents with   Follow-up   Depression   Anxiety   Sleeping Problem   Medication Reaction    HPI [name] presents to the office today for follow-up of MDE and anxiety disorder.  seen 09/2019.  No meds were changed.  Been on Lexapro 20 since early 2017.  02/27/2020 phone call from patient: Pt experiencing more anxiety than she normally has. Pt would like to know what are some options that she has as far as her medication. Pt will be in an appt  MD response: If the anxiety is just occasional then using alprazolam as needed would be an okay thing to do.  If it is been fairly persistent for a couple of weeks or more than the simplest solution would be to increase the Lexapro to 1-1/2 tablets daily.  Historically she has done well on Lexapro 20 mg daily for an extended period of time.  If she is under temporary stress then once that is resolved we could drop it back to 1 daily.  If this does not work as expected then she should schedule an earlier appointment. Pt response: Patient called back and she's been having temporary stress, but it's not bad. Things are great, she's working out and feeling okay for the most part. It's been going on for a bit so she agrees to the increase Lexapro 20 mg 1.5 tablets daily. She has been taking alprazolam at hs occasionally to help sleep better and it does help.   12/06/2020 appointment with the following noted:  No covid.  H Covid and recovered.   Had increased Lexapro to 30 mg for 90 days and saw a difference but started seeing weight gain and was doing oK and able to drop back and been OK. Still renovating a house for a year.  Sold her house and mourns the house. Calmed down.  Kept 3 gkids and dogs 10 days.   Andrea [name] Hopes for new house May 1. Rare alprazolam for HS. Stress moving to  condo and would have crying spell over the move and some problems she was running into dealing with it.  Hard with change.  But taking time to adjust to the idea of moving from her house of 23 years.  Initially refused but he left it up to her and she's come to peace about it.  Andrea [name].   Good response to medication and no SE.  Exercising is good medicine.  If doesn't then doesn't feel as good. Satisfied. Plan: no med changes  08/01/21  appt noted:  seen with [name] died 08-05-2023.  A lot of ups and downs per D.  Hard dips. Got worse when retired and dealing with the new  house.  Had trouble getting rid of things from the old house causing regrets over sellling the house.  D notes doesn't do well with instability  and stressors.  Ruminates on past mistakes. Blamed [name] for the  difficulty renovating the house.  Neg self talk and beats herself up. When not enough sleep will get depressed.  Does better if busy.   Dreads things.  Patient denies difficulty with sleep initiation or maintenance. Denies appetite disturbance.  Patient reports that energy and motivation have been good. Patient denies any difficulty with concentration.  Patient denies  any suicidal ideation.  Plan: Abilify 2.5 mg daily for a week, then 5 mg daily. Continue Lexapro 20 mg daily  08/31/2021 appt noted: It worked immediately.  Insomnia with EMA. Normally 9 hours.  It's like I'm high.  Not tired.  Average 3-4 hours. Not depressed and no crazy negative thoughts.  Good response.  Energetic but up in middle of night. Plan: Okay to stop Abilify because of insomnia.   09/29/2021 appointment with the following noted: Parties were great.  Sleep problem resolved.  A lot of rest at the beach. Maybe the last week or 2 less motivation and socialization.  Not laying in the bed.  Just not as good as she was.  Not as outgoing as normal. No alcohol now.  Only had 1-2 at night.  Not ruminating.   Willing to restart Abilify at lower  dose 2 mg daily. 3 grandsons  Plan: Relapse depression recurs she can resume Abilify  but lower dose 2 mg daily or every other day to try to avoid the insomnia where she can contact our office and we can discussed the possibility of an alternative antidepressant such as Trintellix or duloxetine.  12/28/2021 appointment noted: Several phone calls since she was here.  Abilify plus Lexapro failed.  Switch to duloxetine 90 mg which she started 12/02/2018 2023 Started lamotrigine. Also started Rexulti Sister has had cycling depression that has responded well to lamotrigine with poor response to SSRIs [name] with a little dementia. SE tremor 90 mg duloxetine, ? Wt gain over a few weeks.. Exercise and wt control Social isolation, low self esteem, worry about the way she looks and not usually that way. Crying better with duloxetine.  More dependent on H.   Anhedonia.  Black hole.  Never this low. No SI Having to use Xanax.   Asks about day treatment and TMS Plan: rec TMS Continue lamotrigine as prescribed Increase Rexulti to 1 mg daily Switch to Trintellix:  reduce duloxetine to 2 of the 30 mg capsules and start Trintellix 5 mg daily for 5 days,  Then Increase Trintellix to 10 mg daily and reduce duloxetine to 1 capsule for 1 week. Then stop duloxetine.  02/15/22 appt noted: On Trintellix , Rexulti 1 and lamotrigine I feel good.  Walks 3 miles daily is great for mental health. TMS would not be covered by [facility]. Started feeling better after a week or 2 after the last visit. SE appetite increased. No nausea. Not crying anymore. Sleeping well and no longer ruminates. Plan: Continue Trintellix but DC Rexulti due to weight gain.  Continue lamotrigine  03/06/2022 appointment with the following noted: Last 2 weeks has been very sad and nervous. Trintellix was increased to 20 mg daily  03/10/2022 complaining of ongoing depression, feeling jittery, negative thinking, early morning awakening, ruminating  about past decisions.  Wanting to consider TMS because she is desperate for improvement. Plan we will schedule urgent appointment  03/15/22 urgent appt noted:  seen with Andrea [name] Very depressed, worst ever.  Don't want to be alone, ongoing worry, ruminating on past decisions.  Racing negative thoughts.  No motivation.  Trouble staying asleep.  Getting 4 hours and used to 9 hours. Dread getting up. Using Xanax Increased Trintellix to 20 mg 9 days ago. H agrees sleep is critical. Isolating.   Need to be better by July 11. Plan: Clonazepam 1 mg HS. Continue trintellix 20 mg daily she is only been on this dose 9 days and it needs more time.  Re lamotrigine and increase to 150 mg daily.Leafy Kindle 1.5 mg every other day Option TMS  03/24/22 appt noted: So much better.  Thinking straighter and not negative and not sad.  Sleeping better 8-9 hours.. No crying. H notices progressively better every day.  Mind clarity is much better.   No SE noted.  No hangover.  No nausea. Found a therapist, Krystal Eaton.   Feels well enough to go to [place] and kind of excited.  Back July 16.   Plan: Clonazepam 1 mg HS. This resolved insomnia without SE Continue trintellix 20 mg daily she is only been on this dose 20 days and it needs more time. Re lamotriginecontinue 150 mg daily.Leafy Kindle 1.5 mg every other day Option TMS  04/12/22 appt noted: Great.  Went to [place].  Was herself and enjoyed it.  Anxiety was managed.   [facility] and liked her. 3# wt gain.   Patient reports stable mood and denies depressed or irritable moods.  Patient denies any recent difficulty with anxiety.  Patient denies difficulty with sleep initiation or maintenance. Denies appetite disturbance.  Patient reports that energy and motivation have been good.  Patient denies any difficulty with concentration.  Patient denies any suicidal ideation. Sleeping well than not anxious. Plan: Trintellix 20  07/10/22 TC  depression returned recently.  08/15/22 appt noted: Still blunted  and diminished motivation.  Some days better than others.  Going to gym.  Partially better.   Still has a lot of anxiety and doesn't want to be alone. Functioning but not normal.  Dreads parties.   SE tremor for a couple of week.sleep great.  Dep 7/10. Current Vraylar 1.5 mg daily, increase lamotrigine 200 mg daily, Trintellix 20 , clonazepam 0.5 mg HS Plan: Reduce Clonazepam 1/2  mg HS. This resolved insomnia without SE Continue trintellix 20 mg daily only mildly effective. lamotrigine continue 200 mg daily.Jerrilyn Cairo DT lost response Auvelity Stop Vraylar After Thanksgiving stop Trintellix Wait 3 days then [employer] 1 in the morning for 1 week,  and if no side effects then increase Auvelity to 1 in the AM and 1 in the PM  09/04/22 TC: Complaining of persistent depression and wanting to do something else to help.  Added lithium CR 300 mg nightly  09/11/2022 phone call complaining of no improvement with Auvelity and lithium.  Appointment was moved up.  09/13/22 appt noted: Nothing changed. No better and no worse.  Awakens sad and tearful.  Sleeps well. Pushing herself to the gym.  Has a trainer. On clonazepam 0.5 mg HS She remains anxious when alone for no apparent reason.  Feelings of inferiority.  She is normally extroverted but now she is wanting to isolate from people.  All tasks seem monumental.  No enjoyment and not looking forward to anything.  Everything is a Personal assistant.  She remains generally worried and anxious which is not typical for her. Plan: Clonazepam 1/2  mg HS. This resolved insomnia without SE Stop Auvelity Start nortriptyline 1 of the 25 mg capsules at night for 4 nights then 2 at night for 4 nights then 3 at night, then wait 1 week and get the blood test in the morning at [facility] Reduce lamotrigine to 1 and 1/2 tablets daily  Lamotrigine has not been helpful to him 100 mg daily.  Start reducing  lamotrigine 150 mg daily and will continue to taper at next appointment.  09/27/21  urgent appt :  she wanted urgent appt bc desperate to feel better.  Seen with H Called at least twice since here for the same reasons of depression. Not sleeping well even with clonazepam 1 mg HS. Doesn't want to be alone. Shakey.   On lithium 300 mg daily, nortriptyline 75 mg HS.   Doesn't want to live like this but not acutely suicidal. Tolerating meds. Plan: pursue [facility] protocol TMS at [facility] bc faster optin than alternatives  10/30/22 TC: finished 50 TMS tx in the week and wants appt ASAP bc still struggling with crying and depresssion..  11/03/22 urgent appt : Completed TMS last week. Tue-Thur better days and then Friday nose-dived. Not done well since got back.   H notes high anxiety and crying in AM and PM and doesn't want to be alone.  Xanxax seems to help. Xanax helps and taking 0.5 mg AM and 1 mg HS H notes memory is sharper and she's more alert after treatment. Very anxioius all the time.  Get up sad.  Exercising. Sleep good with Xanax 1 mg HS. Plan: Increase Lexapro 20 mg dailly (should help crying and maybe anxiety but unlikely to resolve depression) Increase alprazolam to 0.5 mg TID and 1 mg HS for severe anxiety. H notes it helps. Pramipexole 0.25 BID for 5 days and if NR then 0.5 mg Bid off label. Reduce lamotrigine 100 mg for 2 weeks then 50 mg daily for 2 weeks then stop it. She wants to pursue Spravato if pramipexole fails  11/17/22 emergency work in appt: Pramipexole was not sent in and MD not aware until yesterday.  She is not any better and is desperate for a med change. Sleeping well and taking Xanax 1 mg HS Mornings are worse.  Not crying much.  Last Friday was a bad day and she didn't want to be alone.  But did let her H play golf once this week. Reduced lamotrigine today to 50 BID and weaning off. H says better this week than last week. Increased Lexapro to 20 mg daily. Plan:  Increased Lexapro 20 mg dailly (helped crying and maybe anxiety but unlikely to resolve depression) Continue alprazolam to 0.5 mg TID prn anxiety and 1 mg HS. H notes it helps. Pramipexole 0.25 mg BID for 3 days then 0.5 mg BID Reduce lamotrigine 50 mg daily for 2 weeks then stop it. She wants to pursue Spravato if pramipexole fails  12/25/22 appt urgently. COMPLETED 1 WEEK TMS [facility] without benefit except TMS took away brain fog.   She feels need to take Xanax 0.5 mg TID  Some sleepiness with Xanax . Mornings are better and fine while at the gym but when home gets depressed again.   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 Needs Xanax to do social things. Plan: Increased Lexapro 20 mg dailly (helped crying and maybe anxiety but unlikely to resolve depression) Continue alprazolam to 0.5 mg TID prn anxiety and 1 mg HS. H notes it helps. Change pramipexole 0.5mg  tablets, 1 tablet four times daily for 1 week,  Then if not better 1and 1/2 tablets in the AM and dinner and 1 tablet at lunch and dinner.    4/9-4/15 hosp for ruptured appendix and missed meds: Can restart Lexapro at 1/2 tablet daily for 3 days then 1 daily. Restart 1/2 pramipexole tablet 3 times daily for 3 days then 1 tablet 3 times daily for 3 days then 1 and 1/2 tablet 3 times daily.  01/12/23 appt noted: No GI sx now.  No appetite but working on protein.   I think I'm doing real well mentally.  Has resumed meds.  Mood has been great. Pramipexole 0.5mg   1 and 1/2 TID Lexapro 20 Xanax reduced to 1 mg HS H sees a difference.   No SE with meds.   No med changes  01/25/23 appt noted: Meds:  pramipexole 0.75 mg TID, Xanax 1.5 mg HS and 0.5 mg prn, Lexapro 20 mg daily. Doing great.  Staples removed and healing from surgery appendectomy. Back in the gym helps her.   Satisfied with meds. Getting out socially and feeling like her old self.  Chelsea's H Brad went to ER vomiting blood.   Plan: no med changes  02/07/23 appt  noted: Back from [facility] yesterday.  Had ear infection.  Then conjunctivitis.  Eyes hurt.   Before the surgery was getting better and feels like she is sliding back some.  Gradually a little worse since here.  Wonders if she could get better with joy and social drive.   Off Abx .  Sleep is good with meds.  With depression then gets anxious too.  Is functioning.   Meds:  pramipexole 0.75 mg TID, Xanax 1.5 mg HS and 0.5 mg prn, Lexapro 20 mg daily. Consistent and no SE. Plan: Continue Lexapro 20 mg dailly (helped crying and maybe anxiety but unlikely to resolve depression) Continue alprazolam  1 mg HS. H notes it helps. Increase pramipexole 0.5mg  tablets, 2 tablets 3 times daily  01/31/23 TC:Franchot Erichsen, CMA  to Me     02/20/23  4:56 PM Note Patient reporting she is not feeling as good as she thinks she should. Her anxiety is the biggest concern. She said she can go to the gym in the mornings and does ok, but she had a closing to go to recently and had to take 1/4 of a Xanax, reports it took the edge off. She reports daily tremors that are helped by Xanax. She is not crying, she is getting things done, sleeping okay. Will have her grandchildren next week and she isn't excited about it like she should be.    Taking 6 mirapex, 1.5 Xanax and Lexapro 20 mg.     MD resp:  CC   02/20/23  5:48 PM Note We just increased the pramipexole recently and I see her next week.  I do not want to increase the medicine any further until I see her again.      03/02/23 urgent appt noted: Some improvement with incr pramipexole 1 mg TID. Before felt worse in AM now feels better in AM and goes to gym. Otherwise no excitement.  Hard to make decisions. Less social and smiling.  Subdued more than normal.  Not confident.  Not enjoying present and ruminates in past.   No SE.  No impulsivity Has tremor for awhile .  Is mild for a month. Hard to conc.  Brain fog was better with TMS but is back. Continue Lexapro 20 mg  dailly (helped crying and maybe anxiety but unlikely to resolve depression) Continue alprazolam  1 mg HS. H notes it helps. Increase pramipexole 1 mg QID times daily  03/16/23 appt noted: Meds as above SE: If not enough food makes her excitable, urgent, talking fast.  Does it a bit too much.  Energy fine.   Feels better in the AM at the gym and when home a dark shadow comes over her.  Still feels dep overall.  Went to work with friends and didn't feel natural and didn't enjoy it like she should.  Pushes herself to stay active.   Takes 1.5 mg Xanax HS and then awakens and takes another 0.5 mg HS but gets enough sleep. A lot of brain fog.  Feels inferior and doesn't usually feel that way. Infrequent crying spells. Plan:  Continue Lexapro 20 mg dailly (helped crying and maybe anxiety but unlikely to resolve depression) Continue alprazolam  1 mg HS. H notes it helps. Reduce pramipexole 1 mg TID for 3 days, then 0.5 mg QID for 3 days, then 0.5 mg BID for 3 days, then 0.25 mg BID for 3 days, then stop it. Now start Latuda (lurasidone) 40 mg 1/2 tablet for 1 week.  If not benefit then 1 tablet daily with 350 calories  04/13/23 urgent appt note: with [facility] 04/05/23 for dep with SI "Worst I've ever been".  Feels out of control.  Shaking .  Can't sleep.  Feels disconnected.   Hated being hosp bc locked up.   Taking quetiapine 100 hS and lorazepam and still can't sleep. Started sertraline 50 and stopped Lexapro. Quetiapine 100 mg HS   ECT-MADRS    Flowsheet Row Office Visit from 04/13/2023 in [facility] Office Visit from 11/03/2022 in [facility]  MADRS Total Score 45 40      PHQ2-9    Flowsheet Row Clinical Support from 02/24/2022 in [facility] Video Visit from 06/14/2021 in [facility] [place]  PHQ-2 Total Score 0 0  PHQ-9 Total Score -- 0      Flowsheet Row ED from  01/31/2023 in [facility]  ED to Hosp-Admission (Discharged) from 01/02/2023 in [facility] from 12/03/2022 in [facility]   C-SSRS RISK CATEGORY No Risk No Risk No Risk      Past Psychiatric Medication Trials: Sertraline, fluoxetine, citalopram 50, nefazodone,  Lexapro 30 weight gain,  Viibryd 30, Wellbutrin 300 , Duloxetine 90 SE tremor, mirtazapine,  Trintellix 20 little response Auvelity twice daily for 3 weeks no response Nortriptyline 75 level 86 NR Pramipexole 1 mg QID  poor resp and felt hyped  TMS  Lamotrigine 200 no response Abilify, Rexulti 0.5 Vraylar 1.5 daily tremor and lost response Lithium 300 shakes  methyl folate buspirone,  alprazolam, clonazepam, Under the care of [facility] since January 2001  Sister does well on lamotrigine.  Review of Systems:  Review of Systems  Constitutional:  Negative for appetite change.  Cardiovascular:  Negative for palpitations.  Gastrointestinal:  Negative for nausea.  Neurological:  Positive for tremors. Negative for dizziness and light-headedness.  Psychiatric/Behavioral:  Positive for dysphoric mood and sleep disturbance. Negative for behavioral problems, confusion, decreased concentration, hallucinations, self-injury and suicidal ideas. The patient is nervous/anxious. The patient is not hyperactive.     Medications: I have reviewed the patient's current medications.  Current Outpatient Medications  Medication Sig Dispense Refill   atorvastatin (LIPITOR) 20 MG tablet Take 20 mg by mouth.      fluticasone (FLONASE) 50 MCG/ACT nasal spray fluticasone propionate 50 mcg/actuation nasal spray,suspension     levothyroxine (SYNTHROID, LEVOTHROID) 50 MCG tablet Take 50 mcg by mouth daily before breakfast.     LORazepam (ATIVAN) 1 MG tablet Take 1 mg by mouth at bedtime.     QUEtiapine (SEROQUEL)  100 MG tablet Take 100 mg by  mouth at bedtime.     raloxifene (EVISTA) 60 MG tablet Take 60 mg by mouth daily.     sertraline (ZOLOFT) 50 MG tablet Take 50 mg by mouth daily.     azithromycin (ZITHROMAX) 250 MG tablet Take 1 tablet (250 mg total) by mouth daily. Take first 2 tablets together, then 1 every day until finished. 6 tablet 0   calcium citrate-vitamin D (CITRACAL+D) 315-200 MG-UNIT tablet Take 1 tablet by mouth daily.     XIIDRA 5 % SOLN Place 1 drop into both eyes daily.     No current facility-administered medications for this visit.    Medication Side Effects: None  Allergies:  Allergies  Allergen Reactions   Hydrocodone-Acetaminophen Other (See Comments)    Passes out   Morphine Other (See Comments)    Passes out    Past Medical History:  Diagnosis Date   GAD (generalized anxiety disorder)    Hyperlipidemia    Hypothyroidism    Osteopenia    Sinusitis     Family History  Problem Relation Age of Onset   Arthritis Mother    COPD Mother    Hypercholesterolemia Mother    Dementia Mother    Stroke Mother    Prostate cancer Brother    Cancer Maternal Grandmother        Oral    Social History   Socioeconomic History   Marital status: Married    Spouse name: Not on file   Number of children: Not on file   Years of education: Not on file   Highest education level: Not on file  Occupational History   Not on file  Tobacco Use   Smoking status: Never   Smokeless tobacco: Never  Vaping Use   Vaping status: Never Used  Substance and Sexual Activity   Alcohol use: Not Currently    Comment: social   Drug use: Never   Sexual activity: Not on file  Other Topics Concern   Not on file  Social History Narrative   Married.   1 child. 2 grandchildren.   Retired Once worked as a Psychologist, sport and exercise.   Enjoys exercising, spending time with family   Social Determinants of Health   Financial Resource Strain: Low Risk  (02/24/2022)   Overall Financial Resource Strain (CARDIA)    Difficulty of  Paying Living Expenses: Not hard at all  Food Insecurity: No Food Insecurity (01/08/2023)   Hunger Vital Sign    Worried About Running Out of Food in the Last Year: Never true    Ran Out of Food in the Last Year: Never true  Transportation Needs: No Transportation Needs (01/08/2023)   PRAPARE - Administrator, Civil Service (Medical): No    Lack of Transportation (Non-Medical): No  Physical Activity: Sufficiently Active (02/24/2022)   Exercise Vital Sign    Days of Exercise per Week: 5 days    Minutes of Exercise per Session: 90 min  Stress: No Stress Concern Present (02/24/2022)   [employer] of Occupational Health - Occupational Stress Questionnaire    Feeling of Stress : Not at all  Social Connections: Unknown (02/03/2022)   Received from [facility]   Social Network    Social Network: Not on file  Intimate Partner Violence: Not At Risk (01/02/2023)   Humiliation, Afraid, Rape, and Kick questionnaire    Fear of Current or Ex-Partner: No    Emotionally Abused: No    Physically  Abused: No    Sexually Abused: No    Past Medical History, Surgical history, Social history, and Family history were reviewed and updated as appropriate.   3 grandsons.  Please see review of systems for further details on the patient's review from today.   Objective:   Physical Exam:  There were no vitals taken for this visit.  Physical Exam Neurological:     Mental Status: She is alert and oriented to person, place, and time.     Cranial Nerves: No dysarthria.  Psychiatric:        Attention and Perception: Attention and perception normal.        Mood and Affect: Mood is anxious and depressed.        Speech: Speech normal.        Behavior: Behavior is cooperative.        Thought Content: Thought content normal. Thought content is not paranoid or delusional. Thought content does not include homicidal or suicidal ideation. Thought content does not include suicidal plan.         Cognition and Memory: Cognition and memory normal.        Judgment: Judgment normal.     Comments: Insight intact More desperate with dep      Lab Review:     Component Value Date/Time   NA 132 (L) 01/07/2023 0518   K 3.6 01/07/2023 0518   CL 102 01/07/2023 0518   CO2 25 01/07/2023 0518   GLUCOSE 97 01/07/2023 0518   BUN 5 (L) 01/07/2023 0518   CREATININE 0.54 01/07/2023 0518   CALCIUM 8.1 (L) 01/07/2023 0518   PROT 5.4 (L) 01/05/2023 0403   ALBUMIN 2.7 (L) 01/05/2023 0403   AST 24 01/05/2023 0403   ALT 23 01/05/2023 0403   ALKPHOS 37 (L) 01/05/2023 0403   BILITOT 0.5 01/05/2023 0403   GFRNONAA >60 01/07/2023 0518       Component Value Date/Time   WBC 7.4 01/05/2023 0403   RBC 3.22 (L) 01/05/2023 0403   HGB 10.2 (L) 01/05/2023 0403   HCT 30.4 (L) 01/05/2023 0403   PLT 239 01/05/2023 0403   MCV 94.4 01/05/2023 0403   MCH 31.7 01/05/2023 0403   MCHC 33.6 01/05/2023 0403   RDW 11.9 01/05/2023 0403   LYMPHSABS 1.6 01/02/2023 0602   MONOABS 0.8 01/02/2023 0602   EOSABS 0.1 01/02/2023 0602   BASOSABS 0.0 01/02/2023 0602    No results found for: "POCLITH", "LITHIUM"   No results found for: "PHENYTOIN", "PHENOBARB", "VALPROATE", "CBMZ"   .res Assessment: Plan:    Recurrent major 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    Ongoing depression.  No reason for depression.   She has failed multiple meds as noted above including all the usual categories of antidepressants with the exception of  MAO inhibitors.  Consider Seroquel (not likely to be tolerated DT wt), Olanzapine Fluox Combo.   Consider Spravato,  Extensive discussion of next steps.   Including those above with SE of each disc.  We discussed the short-term risks associated with benzodiazepines including sedation and increased fall risk among others.  Discussed long-term  side effect risk including dependence, potential withdrawal symptoms, and the potential eventual dose-related risk of dementia.  But recent studies from 2020 dispute this association between benzodiazepines and dementia risk. Newer studies in 2020 do not support  an association with dementia.  Consider Genesight but unlikely to direct dosing decisions at this point.  Stop hydroxyzine Increase quetiapine 200 then 300 mg at night for sleep and depression  Stop lorazepam and return to alprazolam 1 mg at night for sleep Increase sertraline to 100 mg daily or 2 of 50 mg nightly  Try to obtain Spravato ASAP for severe TRD  Discussed safety plan at length with patient.  Advised patient to contact office with any worsening signs and symptoms.  Instructed patient to [facility] room for evaluation if experiencing any acute safety concerns, to include suicidal intent. Disc recent statements she couldn't live like this .  No sui intent.    Rec counseling .  She has a scheduled appt.  FU 1 weeks  Meredith Staggers, MD, DFAPA  Please see After Visit Summary for patient specific instructions.  Meredith Staggers, MD, DFAPA   Future Appointments  Date Time Provider Department Center  05/17/2023  3:00 PM Cottle, Steva Ready., MD [facility] None       No orders of the defined types were placed in this encounter.      -------------------------------me

## 2023-04-17 ENCOUNTER — Other Ambulatory Visit: Payer: Self-pay

## 2023-04-17 DIAGNOSIS — F339 Major depressive disorder, recurrent, unspecified: Secondary | ICD-10-CM

## 2023-04-17 MED ORDER — SPRAVATO (56 MG DOSE) 28 MG/DEVICE NA SOPK
PACK | NASAL | Status: DC
Start: 2023-04-19 — End: 2023-05-03

## 2023-04-17 MED ORDER — SPRAVATO (84 MG DOSE) 28 MG/DEVICE NA SOPK
84.0000 mg | PACK | NASAL | Status: DC
Start: 2023-04-17 — End: 2023-07-10

## 2023-04-20 ENCOUNTER — Other Ambulatory Visit: Payer: Self-pay

## 2023-04-20 MED ORDER — QUETIAPINE FUMARATE 100 MG PO TABS: 300.0000 mg | ORAL_TABLET | Freq: Every day | ORAL | 0 refills | Status: DC

## 2023-04-20 MED ORDER — ALPRAZOLAM 1 MG PO TABS: 1.0000 mg | ORAL_TABLET | Freq: Every day | ORAL | 0 refills | Status: DC

## 2023-04-20 MED ORDER — SERTRALINE HCL 50 MG PO TABS: 100.0000 mg | ORAL_TABLET | Freq: Every day | ORAL | 0 refills | Status: DC

## 2023-04-23 ENCOUNTER — Encounter: Payer: Self-pay | Admitting: Psychiatry

## 2023-04-23 ENCOUNTER — Ambulatory Visit (INDEPENDENT_AMBULATORY_CARE_PROVIDER_SITE_OTHER): Payer: Medicare Other | Admitting: Psychiatry

## 2023-04-23 ENCOUNTER — Ambulatory Visit: Payer: Medicare Other

## 2023-04-23 VITALS — BP 164/88 | HR 97

## 2023-04-23 DIAGNOSIS — F5105 Insomnia due to other mental disorder: Secondary | ICD-10-CM

## 2023-04-23 DIAGNOSIS — F339 Major depressive disorder, recurrent, unspecified: Secondary | ICD-10-CM

## 2023-04-23 DIAGNOSIS — F411 Generalized anxiety disorder: Secondary | ICD-10-CM

## 2023-04-23 NOTE — Progress Notes (Signed)
Andrea [name] [id number] Aug 28, 1951 72 y.o.   Subjective:   Patient ID:  Andrea [name] is a 72 y.o. (DOB 04/06/1951) female.  Chief Complaint:  Chief Complaint  Patient presents with   Follow-up   Depression   Anxiety    HPI Andrea [name] presents to the office today for follow-up of MDE and anxiety disorder.  seen 09/2019.  No meds were changed.  Been on Lexapro 20 since early 2017.  02/27/2020 phone call from patient: Pt experiencing more anxiety than she normally has. Pt would like to know what are some options that she has as far as her medication. Pt will be in an appt  MD response: If the anxiety is just occasional then using alprazolam as needed would be an okay thing to do.  If it is been fairly persistent for a couple of weeks or more than the simplest solution would be to increase the Lexapro to 1-1/2 tablets daily.  Historically she has done well on Lexapro 20 mg daily for an extended period of time.  If she is under temporary stress then once that is resolved we could drop it back to 1 daily.  If this does not work as expected then she should schedule an earlier appointment. Pt response: Patient called back and she's been having temporary stress, but it's not bad. Things are great, she's working out and feeling okay for the most part. It's been going on for a bit so she agrees to the increase Lexapro 20 mg 1.5 tablets daily. She has been taking alprazolam at hs occasionally to help sleep better and it does help.   12/06/2020 appointment with the following noted:  No covid.  H Covid and recovered.   Had increased Lexapro to 30 mg for 90 days and saw a difference but started seeing weight gain and was doing oK and able to drop back and been OK. Still renovating a house for a year.  Sold her house and mourns the house. Calmed down.  Kept 3 gkids and dogs 10 days.   Andrea [name] Hopes for new house May 1. Rare alprazolam for HS. Stress moving to condo and would have crying spell over the move  and some problems she was running into dealing with it.  Hard with change.  But taking time to adjust to the idea of moving from her house of 23 years.  Initially refused but he left it up to her and she's come to peace about it.  Andrea [name].   Good response to medication and no SE.  Exercising is good medicine.  If doesn't then doesn't feel as good. Satisfied. Plan: no med changes  08/01/21  appt noted:  seen with [name] died 2023/07/25.  A lot of ups and downs per D.  Hard dips. Got worse when retired and dealing with the new  house.  Had trouble getting rid of things from the old house causing regrets over sellling the house.  D notes doesn't do well with instability  and stressors.  Ruminates on past mistakes. Blamed [name] for the  difficulty renovating the house.  Neg self talk and beats herself up. When not enough sleep will get depressed.  Does better if busy.   Dreads things.  Patient denies difficulty with sleep initiation or maintenance. Denies appetite disturbance.  Patient reports that energy and motivation have been good. Patient denies any difficulty with concentration.  Patient denies any suicidal ideation.  Plan: Abilify 2.5 mg  daily for a week, then 5 mg daily. Continue Lexapro 20 mg daily  08/31/2021 appt noted: It worked immediately.  Insomnia with EMA. Normally 9 hours.  It's like I'm high.  Not tired.  Average 3-4 hours. Not depressed and no crazy negative thoughts.  Good response.  Energetic but up in middle of night. Plan: Okay to stop Abilify because of insomnia.   09/29/2021 appointment with the following noted: Parties were great.  Sleep problem resolved.  A lot of rest at the beach. Maybe the last week or 2 less motivation and socialization.  Not laying in the bed.  Just not as good as she was.  Not as outgoing as normal. No alcohol now.  Only had 1-2 at night.  Not ruminating.   Willing to restart Abilify at lower dose 2 mg daily. 3 grandsons  Plan: Relapse  depression recurs she can resume Abilify  but lower dose 2 mg daily or every other day to try to avoid the insomnia where she can contact our office and we can discussed the possibility of an alternative antidepressant such as Trintellix or duloxetine.  12/28/2021 appointment noted: Several phone calls since she was here.  Abilify plus Lexapro failed.  Switch to duloxetine 90 mg which she started 12/02/2018 2023 Started lamotrigine. Also started Rexulti Sister has had cycling depression that has responded well to lamotrigine with poor response to SSRIs [name] with a little dementia. SE tremor 90 mg duloxetine, ? Wt gain over a few weeks.. Exercise and wt control Social isolation, low self esteem, worry about the way she looks and not usually that way. Crying better with duloxetine.  More dependent on H.   Anhedonia.  Black hole.  Never this low. No SI Having to use Xanax.   Asks about day treatment and TMS Plan: rec TMS Continue lamotrigine as prescribed Increase Rexulti to 1 mg daily Switch to Trintellix:  reduce duloxetine to 2 of the 30 mg capsules and start Trintellix 5 mg daily for 5 days,  Then Increase Trintellix to 10 mg daily and reduce duloxetine to 1 capsule for 1 week. Then stop duloxetine.  02/15/22 appt noted: On Trintellix , Rexulti 1 and lamotrigine I feel good.  Walks 3 miles daily is great for mental health. TMS would not be covered by [facility]. Started feeling better after a week or 2 after the last visit. SE appetite increased. No nausea. Not crying anymore. Sleeping well and no longer ruminates. Plan: Continue Trintellix but DC Rexulti due to weight gain.  Continue lamotrigine  03/06/2022 appointment with the following noted: Last 2 weeks has been very sad and nervous. Trintellix was increased to 20 mg daily  03/10/2022 complaining of ongoing depression, feeling jittery, negative thinking, early morning awakening, ruminating about past decisions.  Wanting to consider TMS  because she is desperate for improvement. Plan we will schedule urgent appointment  03/15/22 urgent appt noted:  seen with Andrea [name] Very depressed, worst ever.  Don't want to be alone, ongoing worry, ruminating on past decisions.  Racing negative thoughts.  No motivation.  Trouble staying asleep.  Getting 4 hours and used to 9 hours. Dread getting up. Using Xanax Increased Trintellix to 20 mg 9 days ago. H agrees sleep is critical. Isolating.   Need to be better by July 11. Plan: Clonazepam 1 mg HS. Continue trintellix 20 mg daily she is only been on this dose 9 days and it needs more time. Re lamotrigine and increase to 150 mg daily.Marland Kitchen  Vraylar 1.5 mg every other day Option TMS  03/24/22 appt noted: So much better.  Thinking straighter and not negative and not sad.  Sleeping better 8-9 hours.. No crying. H notices progressively better every day.  Mind clarity is much better.   No SE noted.  No hangover.  No nausea. Found a therapist, Krystal Eaton.   Feels well enough to go to [place] and kind of excited.  Back July 16.   Plan: Clonazepam 1 mg HS. This resolved insomnia without SE Continue trintellix 20 mg daily she is only been on this dose 20 days and it needs more time. Re lamotriginecontinue 150 mg daily.Leafy Kindle 1.5 mg every other day Option TMS  04/12/22 appt noted: Great.  Went to [place].  Was herself and enjoyed it.  Anxiety was managed.   [facility] and liked her. 3# wt gain.   Patient reports stable mood and denies depressed or irritable moods.  Patient denies any recent difficulty with anxiety.  Patient denies difficulty with sleep initiation or maintenance. Denies appetite disturbance.  Patient reports that energy and motivation have been good.  Patient denies any difficulty with concentration.  Patient denies any suicidal ideation. Sleeping well than not anxious. Plan: Trintellix 20  07/10/22 TC depression returned recently.  08/15/22 appt  noted: Still blunted  and diminished motivation.  Some days better than others.  Going to gym.  Partially better.   Still has a lot of anxiety and doesn't want to be alone. Functioning but not normal.  Dreads parties.   SE tremor for a couple of week.sleep great.  Dep 7/10. Current Vraylar 1.5 mg daily, increase lamotrigine 200 mg daily, Trintellix 20 , clonazepam 0.5 mg HS Plan: Reduce Clonazepam 1/2  mg HS. This resolved insomnia without SE Continue trintellix 20 mg daily only mildly effective. lamotrigine continue 200 mg daily.Jerrilyn Cairo DT lost response Auvelity Stop Vraylar After Thanksgiving stop Trintellix Wait 3 days then [employer] 1 in the morning for 1 week,  and if no side effects then increase Auvelity to 1 in the AM and 1 in the PM  09/04/22 TC: Complaining of persistent depression and wanting to do something else to help.  Added lithium CR 300 mg nightly  09/11/2022 phone call complaining of no improvement with Auvelity and lithium.  Appointment was moved up.  09/13/22 appt noted: Nothing changed. No better and no worse.  Awakens sad and tearful.  Sleeps well. Pushing herself to the gym.  Has a trainer. On clonazepam 0.5 mg HS She remains anxious when alone for no apparent reason.  Feelings of inferiority.  She is normally extroverted but now she is wanting to isolate from people.  All tasks seem monumental.  No enjoyment and not looking forward to anything.  Everything is a Personal assistant.  She remains generally worried and anxious which is not typical for her. Plan: Clonazepam 1/2  mg HS. This resolved insomnia without SE Stop Auvelity Start nortriptyline 1 of the 25 mg capsules at night for 4 nights then 2 at night for 4 nights then 3 at night, then wait 1 week and get the blood test in the morning at [facility] Reduce lamotrigine to 1 and 1/2 tablets daily  Lamotrigine has not been helpful to him 100 mg daily.  Start reducing lamotrigine 150 mg daily and will continue to  taper at next appointment.  09/27/21 urgent appt :  she wanted urgent appt bc desperate to  feel better.  Seen with H Called at least twice since here for the same reasons of depression. Not sleeping well even with clonazepam 1 mg HS. Doesn't want to be alone. Shakey.   On lithium 300 mg daily, nortriptyline 75 mg HS.   Doesn't want to live like this but not acutely suicidal. Tolerating meds. Plan: pursue [facility] protocol TMS at [facility] bc faster optin than alternatives  10/30/22 TC: finished 50 TMS tx in the week and wants appt ASAP bc still struggling with crying and depresssion..  11/03/22 urgent appt : Completed TMS last week. Tue-Thur better days and then Friday nose-dived. Not done well since got back.   H notes high anxiety and crying in AM and PM and doesn't want to be alone.  Xanxax seems to help. Xanax helps and taking 0.5 mg AM and 1 mg HS H notes memory is sharper and she's more alert after treatment. Very anxioius all the time.  Get up sad.  Exercising. Sleep good with Xanax 1 mg HS. Plan: Increase Lexapro 20 mg dailly (should help crying and maybe anxiety but unlikely to resolve depression) Increase alprazolam to 0.5 mg TID and 1 mg HS for severe anxiety. H notes it helps. Pramipexole 0.25 BID for 5 days and if NR then 0.5 mg Bid off label. Reduce lamotrigine 100 mg for 2 weeks then 50 mg daily for 2 weeks then stop it. She wants to pursue Spravato if pramipexole fails  11/17/22 emergency work in appt: Pramipexole was not sent in and MD not aware until yesterday.  She is not any better and is desperate for a med change. Sleeping well and taking Xanax 1 mg HS Mornings are worse.  Not crying much.  Last Friday was a bad day and she didn't want to be alone.  But did let her H play golf once this week. Reduced lamotrigine today to 50 BID and weaning off. H says better this week than last week. Increased Lexapro to 20 mg daily. Plan: Increased Lexapro 20 mg dailly (helped crying  and maybe anxiety but unlikely to resolve depression) Continue alprazolam to 0.5 mg TID prn anxiety and 1 mg HS. H notes it helps. Pramipexole 0.25 mg BID for 3 days then 0.5 mg BID Reduce lamotrigine 50 mg daily for 2 weeks then stop it. She wants to pursue Spravato if pramipexole fails  12/25/22 appt urgently. COMPLETED 1 WEEK TMS [facility] without benefit except TMS took away brain fog.   She feels need to take Xanax 0.5 mg TID  Some sleepiness with Xanax . Mornings are better and fine while at the gym but when home gets depressed again.   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 Needs Xanax to do social things. Plan: Increased Lexapro 20 mg dailly (helped crying and maybe anxiety but unlikely to resolve depression) Continue alprazolam to 0.5 mg TID prn anxiety and 1 mg HS. H notes it helps. Change pramipexole 0.5mg  tablets, 1 tablet four times daily for 1 week,  Then if not better 1and 1/2 tablets in the AM and dinner and 1 tablet at lunch and dinner.    4/9-4/15 hosp for ruptured appendix and missed meds: Can restart Lexapro at 1/2 tablet daily for 3 days then 1 daily. Restart 1/2 pramipexole tablet 3 times daily for 3 days then 1 tablet 3 times daily for 3 days then 1 and 1/2 tablet 3 times daily.      01/12/23 appt noted: No GI sx now.  No appetite but  working on protein.   I think I'm doing real well mentally.  Has resumed meds.  Mood has been great. Pramipexole 0.5mg   1 and 1/2 TID Lexapro 20 Xanax reduced to 1 mg HS H sees a difference.   No SE with meds.   No med changes  01/25/23 appt noted: Meds:  pramipexole 0.75 mg TID, Xanax 1.5 mg HS and 0.5 mg prn, Lexapro 20 mg daily. Doing great.  Staples removed and healing from surgery appendectomy. Back in the gym helps her.   Satisfied with meds. Getting out socially and feeling like her old self.  Chelsea's H Brad went to ER vomiting blood.   Plan: no med changes  02/07/23 appt noted: Back from [facility] yesterday.  Had ear infection.   Then conjunctivitis.  Eyes hurt.   Before the surgery was getting better and feels like she is sliding back some.  Gradually a little worse since here.  Wonders if she could get better with joy and social drive.   Off Abx .  Sleep is good with meds.  With depression then gets anxious too.  Is functioning.   Meds:  pramipexole 0.75 mg TID, Xanax 1.5 mg HS and 0.5 mg prn, Lexapro 20 mg daily. Consistent and no SE. Plan: Continue Lexapro 20 mg dailly (helped crying and maybe anxiety but unlikely to resolve depression) Continue alprazolam  1 mg HS. H notes it helps. Increase pramipexole 0.5mg  tablets, 2 tablets 3 times daily  01/31/23 TC:Franchot Erichsen, CMA  to Me     02/20/23  4:56 PM Note Patient reporting she is not feeling as good as she thinks she should. Her anxiety is the biggest concern. She said she can go to the gym in the mornings and does ok, but she had a closing to go to recently and had to take 1/4 of a Xanax, reports it took the edge off. She reports daily tremors that are helped by Xanax. She is not crying, she is getting things done, sleeping okay. Will have her grandchildren next week and she isn't excited about it like she should be.    Taking 6 mirapex, 1.5 Xanax and Lexapro 20 mg.     MD resp:  CC   02/20/23  5:48 PM Note We just increased the pramipexole recently and I see her next week.  I do not want to increase the medicine any further until I see her again.      03/02/23 urgent appt noted: Some improvement with incr pramipexole 1 mg TID. Before felt worse in AM now feels better in AM and goes to gym. Otherwise no excitement.  Hard to make decisions. Less social and smiling.  Subdued more than normal.  Not confident.  Not enjoying present and ruminates in past.   No SE.  No impulsivity Has tremor for awhile .  Is mild for a month. Hard to conc.  Brain fog was better with TMS but is back. Continue Lexapro 20 mg dailly (helped crying and maybe anxiety but unlikely  to resolve depression) Continue alprazolam  1 mg HS. H notes it helps. Increase pramipexole 1 mg QID times daily  03/16/23 appt noted: Meds as above SE: If not enough food makes her excitable, urgent, talking fast.  Does it a bit too much.  Energy fine.   Feels better in the AM at the gym and when home a dark shadow comes over her.   Still feels dep overall.  Went to work with friends and  didn't feel natural and didn't enjoy it like she should.  Pushes herself to stay active.   Takes 1.5 mg Xanax HS and then awakens and takes another 0.5 mg HS but gets enough sleep. A lot of brain fog.  Feels inferior and doesn't usually feel that way. Infrequent crying spells. Plan:  Continue Lexapro 20 mg dailly (helped crying and maybe anxiety but unlikely to resolve depression) Continue alprazolam  1 mg HS. H notes it helps. Reduce pramipexole 1 mg TID for 3 days, then 0.5 mg QID for 3 days, then 0.5 mg BID for 3 days, then 0.25 mg BID for 3 days, then stop it. Now start Latuda (lurasidone) 40 mg 1/2 tablet for 1 week.  If not benefit then 1 tablet daily with 350 calories  04/13/23 urgent appt note: with [facility] 04/05/23 for dep with SI "Worst I've ever been".  Feels out of control.  Shaking .  Can't sleep.  Feels disconnected.   Hated being hosp bc locked up.   Taking quetiapine 100 hS and lorazepam and still can't sleep. Started sertraline 50 and stopped Lexapro. Quetiapine 100 mg HS Plan: Stop hydroxyzine Increase quetiapine 200 then 300 mg at night for sleep and depression  Stop lorazepam and return to alprazolam 1 mg at night for sleep Increase sertraline to 100 mg daily or 2 of 50 mg nightly  04/23/23 appt noted: Here for first appt with Spravato.  Highly anxious about getting Spravato today.  Doesn't like feeling out of control and worries over this. Still very depressed.  Doesn't think her sx as noted are due to depression.  Thinks she has a neuro problem.  But she has no other neuro sx like  numbness, weakness, balance px, or anything other than dep and anxiety sx. Received Spravato 56 mg today.  "It was horrible!" Bc she felt out of control and was anxious receiving it.  Thought it would last forever re: dissociation.  It was scary to her.  Couldn't relax for it.  No N, V, HA.  Ambivalent about continuing Spravato but open. No specific concerns with meds.      ECT-MADRS    Flowsheet Row Office Visit from 04/13/2023 in [facility] Office Visit from 11/03/2022 in [facility]  MADRS Total Score 45 40      PHQ2-9    Flowsheet Row Clinical Support from 02/24/2022 in [facility] Video Visit from 06/14/2021 in [facility]  PHQ-2 Total Score 0 0  PHQ-9 Total Score -- 0      Flowsheet Row ED from 01/31/2023 in [facility]  ED to Hosp-Admission (Discharged) from 01/02/2023 in [facility] ED from 12/03/2022 in [facility] at [facility]   C-SSRS RISK CATEGORY No Risk No Risk No Risk      Past Psychiatric Medication Trials: Sertraline, fluoxetine, citalopram 50, nefazodone,  Lexapro 30 weight gain,  Viibryd 30, Wellbutrin 300 , Duloxetine 90 SE tremor, mirtazapine,  Trintellix 20 little response Auvelity twice daily for 3 weeks no response Nortriptyline 75 level 86 NR Pramipexole 1 mg QID  poor resp and felt hyped  TMS  Lamotrigine 200 no response Abilify, Rexulti 0.5 Vraylar 1.5 daily tremor and lost response Lithium 300 shakes  methyl folate buspirone,  alprazolam, clonazepam, Under the care of [facility] since January 2001  Sister does well on lamotrigine.  Review of Systems:  Review of Systems  Constitutional:  Negative for appetite change.  Cardiovascular:  Negative for palpitations.  Gastrointestinal:  Negative for nausea.  Neurological:  Positive  for tremors and light-headedness. Negative for dizziness.  Psychiatric/Behavioral:  Positive for decreased concentration, dysphoric mood and sleep disturbance. Negative for behavioral problems, confusion, hallucinations, self-injury and suicidal ideas. The patient is nervous/anxious. The patient is not hyperactive.     Medications: I have reviewed the patient's current medications.  Current Outpatient Medications  Medication Sig Dispense Refill   ALPRAZolam (XANAX) 1 MG tablet Take 1 tablet (1 mg total) by mouth at bedtime. 30 tablet 0   atorvastatin (LIPITOR) 20 MG tablet Take 20 mg by mouth.      azithromycin (ZITHROMAX) 250 MG tablet Take 1 tablet (250 mg total) by mouth daily. Take first 2 tablets together, then 1 every day until finished. 6 tablet 0   calcium citrate-vitamin D (CITRACAL+D) 315-200 MG-UNIT tablet Take 1 tablet by mouth daily.     Esketamine HCl, 56 MG Dose, (SPRAVATO, 56 MG DOSE,) 28 MG/DEVICE SOPK Place 56 mg intranasally twice a week for one week.     fluticasone (FLONASE) 50 MCG/ACT nasal spray fluticasone propionate 50 mcg/actuation nasal spray,suspension     levothyroxine (SYNTHROID, LEVOTHROID) 50 MCG tablet Take 50 mcg by mouth daily before breakfast.     QUEtiapine (SEROQUEL) 100 MG tablet Take 3 tablets (300 mg total) by mouth at bedtime. 90 tablet 0   raloxifene (EVISTA) 60 MG tablet Take 60 mg by mouth daily.     sertraline (ZOLOFT) 50 MG tablet Take 2 tablets (100 mg total) by mouth at bedtime. 60 tablet 0   XIIDRA 5 % SOLN Place 1 drop into both eyes daily.     Esketamine HCl, 84 MG Dose, (SPRAVATO, 84 MG DOSE,) 28 MG/DEVICE SOPK Place 84 mg into the nose every 3 (three) days. (Patient not taking: Reported on 04/23/2023)     No current facility-administered medications for this visit.    Medication Side Effects: None  Allergies:  Allergies  Allergen Reactions   Hydrocodone-Acetaminophen Other (See Comments)    Passes out   Morphine Other (See Comments)     Passes out    Past Medical History:  Diagnosis Date   GAD (generalized anxiety disorder)    Hyperlipidemia    Hypothyroidism    Osteopenia    Sinusitis     Family History  Problem Relation Age of Onset   Arthritis Mother    COPD Mother    Hypercholesterolemia Mother    Dementia Mother    Stroke Mother    Prostate cancer Brother    Cancer Maternal Grandmother        Oral    Social History   Socioeconomic History   Marital status: Married    Spouse name: Not on file   Number of children: Not on file   Years of education: Not on file   Highest education level: Not on file  Occupational History   Not on file  Tobacco Use   Smoking status: Never   Smokeless tobacco: Never  Vaping Use   Vaping status: Never Used  Substance and Sexual Activity   Alcohol use: Not Currently    Comment: social   Drug use: Never   Sexual activity: Not on file  Other Topics Concern   Not on file  Social History Narrative   Married.   1 child. 2 grandchildren.   Retired Once worked as a  business owner.   Enjoys exercising, spending time with family   Social Determinants of Health   Financial Resource Strain: Low Risk  (02/24/2022)   Overall Financial Resource Strain (CARDIA)    Difficulty of Paying Living Expenses: Not hard at all  Food Insecurity: No Food Insecurity (01/08/2023)   Hunger Vital Sign    Worried About Running Out of Food in the Last Year: Never true    Ran Out of Food in the Last Year: Never true  Transportation Needs: No Transportation Needs (01/08/2023)   PRAPARE - Administrator, Civil Service (Medical): No    Lack of Transportation (Non-Medical): No  Physical Activity: Sufficiently Active (02/24/2022)   Exercise Vital Sign    Days of Exercise per Week: 5 days    Minutes of Exercise per Session: 90 min  Stress: No Stress Concern Present (02/24/2022)   [employer] of Occupational Health - Occupational Stress Questionnaire    Feeling of Stress :  Not at all  Social Connections: Unknown (02/03/2022)   Received from [facility]   Social Network    Social Network: Not on file  Intimate Partner Violence: Not At Risk (01/02/2023)   Humiliation, Afraid, Rape, and Kick questionnaire    Fear of Current or Ex-Partner: No    Emotionally Abused: No    Physically Abused: No    Sexually Abused: No    Past Medical History, Surgical history, Social history, and Family history were reviewed and updated as appropriate.   3 grandsons.  Please see review of systems for further details on the patient's review from today.   Objective:   Physical Exam:  There were no vitals taken for this visit.  Physical Exam Constitutional:      General: She is not in acute distress. Musculoskeletal:        General: No deformity.  Neurological:     Mental Status: She is alert and oriented to person, place, and time.     Cranial Nerves: No dysarthria.     Coordination: Coordination normal.  Psychiatric:        Attention and Perception: Attention and perception normal. She does not perceive auditory or visual hallucinations.        Mood and Affect: Mood is anxious and depressed. Affect is not labile, blunt, angry or inappropriate.        Speech: Speech normal.        Behavior: Behavior normal. Behavior is cooperative.        Thought Content: Thought content normal. Thought content is not paranoid or delusional. Thought content does not include homicidal or suicidal ideation. Thought content does not include suicidal plan.        Cognition and Memory: Cognition and memory normal.        Judgment: Judgment normal.     Comments: Insight intact More desperate with dep  Somatic focus and belief her problem is not just depression.     Lab Review:     Component Value Date/Time   NA 132 (L) 01/07/2023 0518   K 3.6 01/07/2023 0518   CL 102 01/07/2023 0518   CO2 25 01/07/2023 0518   GLUCOSE 97 01/07/2023 0518   BUN 5 (L) 01/07/2023 0518   CREATININE  0.54 01/07/2023 0518   CALCIUM 8.1 (L) 01/07/2023 0518   PROT 5.4 (L) 01/05/2023 0403   ALBUMIN 2.7 (L) 01/05/2023 0403   AST 24 01/05/2023 0403   ALT 23 01/05/2023 0403   ALKPHOS 37 (L) 01/05/2023  0403   BILITOT 0.5 01/05/2023 0403   GFRNONAA >60 01/07/2023 0518       Component Value Date/Time   WBC 7.4 01/05/2023 0403   RBC 3.22 (L) 01/05/2023 0403   HGB 10.2 (L) 01/05/2023 0403   HCT 30.4 (L) 01/05/2023 0403   PLT 239 01/05/2023 0403   MCV 94.4 01/05/2023 0403   MCH 31.7 01/05/2023 0403   MCHC 33.6 01/05/2023 0403   RDW 11.9 01/05/2023 0403   LYMPHSABS 1.6 01/02/2023 0602   MONOABS 0.8 01/02/2023 0602   EOSABS 0.1 01/02/2023 0602   BASOSABS 0.0 01/02/2023 0602    No results found for: "POCLITH", "LITHIUM"   No results found for: "PHENYTOIN", "PHENOBARB", "VALPROATE", "CBMZ"   .res Assessment: Plan:    Recurrent major depression resistant to treatment (HCC)  Generalized anxiety disorder  Insomnia due to mental condition   Ongoing depression.  No reason for depression. Highly anxious with this.  Patient was administered Spravato 56 mg intranasally today.  The patient experienced the typical dissociation which gradually resolved over the 2-hour period of observation.  There were no complications.  Specifically the patient did not have nausea or vomiting or headache.  Blood pressures remained within normal ranges at the 40-minute and 2-hour follow-up intervals.  By the time the 2-hour observation period was met the patient was alert and oriented and able to exit without assistance.   See nursing note for further details. Will continue Spravato 56 mg twice weekly until she tolerates dissociation better.     She has failed multiple meds as noted above including all the usual categories of antidepressants with the exception of  MAO inhibitors.  Consider Seroquel (not likely to be tolerated DT wt), Olanzapine Fluox Combo.   Consider Spravato,  Extensive discussion of next  steps.   Including those above with SE of each disc.  We discussed the short-term risks associated with benzodiazepines including sedation and increased fall risk among others.  Discussed long-term side effect risk including dependence, potential withdrawal symptoms, and the potential eventual dose-related risk of dementia.  But recent studies from 2020 dispute this association between benzodiazepines and dementia risk. Newer studies in 2020 do not support an association with dementia.  Consider Genesight but unlikely to direct dosing decisions at this point.  Plan with meds. Increase quetiapine 200 then 300 mg at night for sleep and depression  Continue  alprazolam 1 mg at night for sleep Increase sertraline to 100 mg daily or 2 of 50 mg nightly   Spravato 56 mg AM twice weekly. ASAP for severe TRD  Discussed safety plan at length with patient.  Advised patient to contact office with any worsening signs and symptoms.  Instructed patient to go to the [facility] emergency room for evaluation if experiencing any acute safety concerns, to include suicidal intent. Disc recent statements she couldn't live like this .  No sui intent.    Rec counseling .  She has a scheduled appt.  FU twice weekly.  Meredith Staggers, MD, DFAPA  Please see After Visit Summary for patient specific instructions.  Meredith Staggers, MD, DFAPA   Future Appointments  Date Time Provider Department Center  04/25/2023  9:30 AM Cottle, Steva Ready., MD [facility] None  04/25/2023  9:30 AM CP-NURSE [facility] None  05/02/2023  2:20 PM Doreene Nest, NP [facility]  05/17/2023  3:00 PM Cottle, Steva Ready., MD [facility] None       No orders of the defined types were placed  in this encounter.      -------------------------------me

## 2023-04-23 NOTE — Progress Notes (Unsigned)
NURSES NOTE:   NURSES NOTE:         Pt arrived for his #1 Spravato Treatment for treatment resistant depression, the starting dose was 56 mg (2 of the 28 mg nasal sprays) he received 2 doses last week, he had his first dose of 84 mg this past Monday will get the 84 mg again today ( 3 of the 28 mg) twice this week. Pt reports no side effects, he did start Vraylar 1.5 mg on Tuesday and didn't feel well that day. Reports of malaise, lethargy, depression, frequent naps. Pt reports not taking any today. I advised him to try taking tomorrow night closer to bedtime to see if that will help with some of his symptoms. He agreed. Pt sees [place], DNP so she will follow him throughout his treatments and follow ups. Explained to pt how the treatments would be scheduled and answered any questions he had today. He was given a practice nasal trainer to try a few times before given the one with medication. He verbalized understanding. Pt's Spravato is [employer] officer, political party through [employer]. Spravato medication is stored at treatment center per REMS/FDA guidelines. The medication is required to be locked behind two doors per REMS/FDA protocol. Medication is also disposed of properly after each use per regulations.         Pt voiced no side effects or issues after leaving his last treatment, pt will get 84 mg today. Began taking patient's vital signs at 1:15 PM 158/87, pulse 79. Pt did go to restroom before given first inhaler. Instructed patient to blow his nose then recline back to a 45 degree angle. Gave patient first dose 28 mg nasal spray, administered in each nostril as directed and observed by nurse, waited 5 more minutes for the second and third dose. After both doses given pt did not complain of any nausea/vomiting, pt did have a drink to help with the taste of Spravato it gives the metallic taste. Pt reported he felt pretty intense today with the dose increase. Pt was sitting up in chair when nurse assessed  his 40 minute vitals, he reported that his thoughts were still pretty intense, 2:05 PM, 152/91, pulse 75. Pt reports doing fine, no complaints voiced  Explained he would be monitored for a total time of 120 minutes. Discharge vitals were taken at 3:05 PM 148/93, P 69. Rene Kocher came to visit with patient once his thoughts were clearer to discuss how treatment went. Recommend he go home and sleep or just relax on the couch. No driving, no intense activities. Pt. will be receiving 2 treatments per week for 4 weeks as recommended. Nurse was with pt a total of 60 minutes for clinical assessment. Pt is scheduled next Monday, July 29th. Pt instructed to call office with any problems or questions prior to next treatment. Pt's ride, Bonita Quin is here to pick him up.      LOT [id number] EXP OCT 2026

## 2023-04-25 ENCOUNTER — Ambulatory Visit: Payer: Medicare Other

## 2023-04-25 ENCOUNTER — Encounter: Payer: Self-pay | Admitting: Psychiatry

## 2023-04-25 ENCOUNTER — Ambulatory Visit (INDEPENDENT_AMBULATORY_CARE_PROVIDER_SITE_OTHER): Payer: Medicare Other | Admitting: Psychiatry

## 2023-04-25 VITALS — BP 136/89 | HR 98

## 2023-04-25 DIAGNOSIS — F5105 Insomnia due to other mental disorder: Secondary | ICD-10-CM

## 2023-04-25 DIAGNOSIS — F339 Major depressive disorder, recurrent, unspecified: Secondary | ICD-10-CM

## 2023-04-25 DIAGNOSIS — F411 Generalized anxiety disorder: Secondary | ICD-10-CM

## 2023-04-25 NOTE — Progress Notes (Signed)
NURSES NOTE:           Pt arrived for her #2 Spravato Treatment for treatment resistant depression, the starting dose is 56 mg (2 of the 28 mg nasal sprays) and today she will get another 56 mg before increasing up to 84 mg. Pt arrived very anxious and nervous about Spravato, reassured her that there has been good success, gave her examples of other pt's experiences. She did take Alprazolam 0.5 mg prior to coming but instructed her to take the additional 1/2 tablet to be able to get her started, this won't be a long term recommendation but until she is feeling better about the treatments. Explained to pt and her husband how the treatments would be scheduled and answered any questions they had today. Pt's Spravato is Research officer, political party through [employer] Spravato medication is stored at treatment center per REMS/FDA guidelines. The medication is required to be locked behind two doors per REMS/FDA protocol. Medication is also disposed of properly after each use per regulations. All treatments are charted per FDA/REMS guidelines in Spravato Rems.          Began taking patient's vital signs at 9:40 AM 149/92, pulse 102, pulse ox 97%. Pt didn't recline back she feels better with her feet on the ground but she did raise her head back when getting the nasal sprays. Gave patient first dose 28 mg nasal spray, administered in each nostril as directed and observed by nurse, waited 5 more minutes for the second dose. After both doses given pt did not complain of any nausea/vomiting, pt did have a drink and some candy to help with the taste of Spravato it gives the metallic taste. Pt remained sitting up in chair when nurse assessed her 40 minute vitals, she did not speak and kept her eyes closed, I felt she did better today and less anxious at this point, 10:15 AM, 140/83, pulse 87. Explained he would be monitored for a total time of 120 minutes. Discharge vitals were taken at 11:35 AM 136/89, P 98. Dr. Jennelle Human came  to visit with patient at end of her treatment. Dr. Jennelle Human advised that pt would increase her dose to 84 mg at her next treatment.  Recommend she go home and sleep or just relax on the couch. No driving, no intense activities. Pt. will be receiving 2 treatments per week for 4 weeks as recommended. Nurse was with pt a total of 60 minutes for clinical assessment. Pt is scheduled next week on Tuesday and Thursday, August 6th and 8th. Pt instructed to call office with any problems or questions prior to next treatment. Pt left with husband.       LOT [id number] EXP NOV 2026

## 2023-04-25 NOTE — Progress Notes (Signed)
Andrea [name] [id number] 20-Nov-1950 72 y.o.   Subjective:   Patient ID:  Andrea [name] is a 72 y.o. (DOB [birth year]) female.  Chief Complaint:  Chief Complaint  Patient presents with   Follow-up   Depression   Anxiety    HPI Andrea [name] presents to the office today for follow-up of MDE and anxiety disorder.  seen 09/2019.  No meds were changed.  Been on Lexapro 20 since early 2017.  02/27/2020 phone call from patient: Pt experiencing more anxiety than she normally has. Pt would like to know what are some options that she has as far as her medication. Pt will be in an appt  MD response: If the anxiety is just occasional then using alprazolam as needed would be an okay thing to do.  If it is been fairly persistent for a couple of weeks or more than the simplest solution would be to increase the Lexapro to 1-1/2 tablets daily.  Historically she has done well on Lexapro 20 mg daily for an extended period of time.  If she is under temporary stress then once that is resolved we could drop it back to 1 daily.  If this does not work as expected then she should schedule an earlier appointment. Pt response: Patient called back and she's been having temporary stress, but it's not bad. Things are great, she's working out and feeling okay for the most part. It's been going on for a bit so she agrees to the increase Lexapro 20 mg 1.5 tablets daily. She has been taking alprazolam at hs occasionally to help sleep better and it does help.   12/06/2020 appointment with the following noted:  No covid.  H Covid and recovered.   Had increased Lexapro to 30 mg for 90 days and saw a difference but started seeing weight gain and was doing oK and able to drop back and been OK. Still renovating a house for a year.  Sold her house and mourns the house. Calmed down.  Kept 3 gkids and dogs 10 days.   Andrea [name] Hopes for new house May 1. Rare alprazolam for HS. Stress moving to condo and would have crying spell over the move  and some problems she was running into dealing with it.  Hard with change.  But taking time to adjust to the idea of moving from her house of 23 years.  Initially refused but he left it up to her and she's come to peace about it.  Andrea [name].   Good response to medication and no SE.  Exercising is good medicine.  If doesn't then doesn't feel as good. Satisfied. Plan: no med changes  08/01/21  appt noted:  seen with D Andrea [name] 08-04-2023.  A lot of ups and downs per D.  Hard dips. Got worse when retired and dealing with the new  house.  Had trouble getting rid of things from the old house causing regrets over sellling the house.  D notes doesn't do well with instability  and stressors.  Ruminates on past mistakes. Blamed Andrea [name] for the  difficulty renovating the house.  Neg self talk and beats herself up. When not enough sleep will get depressed.  Does better if busy.   Dreads things.  Patient denies difficulty with sleep initiation or maintenance. Denies appetite disturbance.  Patient reports that energy and motivation have been good. Patient denies any difficulty with concentration.  Patient denies any suicidal ideation.  Plan: Abilify 2.5 mg  daily for a week, then 5 mg daily. Continue Lexapro 20 mg daily  08/31/2021 appt noted: It worked immediately.  Insomnia with EMA. Normally 9 hours.  It's like I'm high.  Not tired.  Average 3-4 hours. Not depressed and no crazy negative thoughts.  Good response.  Energetic but up in middle of night. Plan: Okay to stop Abilify because of insomnia.   09/29/2021 appointment with the following noted: Parties were great.  Sleep problem resolved.  A lot of rest at the beach. Maybe the last week or 2 less motivation and socialization.  Not laying in the bed.  Just not as good as she was.  Not as outgoing as normal. No alcohol now.  Only had 1-2 at night.  Not ruminating.   Willing to restart Abilify at lower dose 2 mg daily. 3 grandsons  Plan: Relapse  depression recurs she can resume Abilify  but lower dose 2 mg daily or every other day to try to avoid the insomnia where she can contact our office and we can discussed the possibility of an alternative antidepressant such as Trintellix or duloxetine.  12/28/2021 appointment noted: Several phone calls since she was here.  Abilify plus Lexapro failed.  Switch to duloxetine 90 mg which she started 12/02/2018 2023 Started lamotrigine. Also started Rexulti Sister has had cycling depression that has responded well to lamotrigine with poor response to SSRIs [name] with a little dementia. SE tremor 90 mg duloxetine, ? Wt gain over a few weeks.. Exercise and wt control Social isolation, low self esteem, worry about the way she looks and not usually that way. Crying better with duloxetine.  More dependent on H.   Anhedonia.  Black hole.  Never this low. No SI Having to use Xanax.   Asks about day treatment and TMS Plan: rec TMS Continue lamotrigine as prescribed Increase Rexulti to 1 mg daily Switch to Trintellix:  reduce duloxetine to 2 of the 30 mg capsules and start Trintellix 5 mg daily for 5 days,  Then Increase Trintellix to 10 mg daily and reduce duloxetine to 1 capsule for 1 week. Then stop duloxetine.  02/15/22 appt noted: On Trintellix , Rexulti 1 and lamotrigine I feel good.  Walks 3 miles daily is great for mental health. TMS would not be covered by [facility]. Started feeling better after a week or 2 after the last visit. SE appetite increased. No nausea. Not crying anymore. Sleeping well and no longer ruminates. Plan: Continue Trintellix but DC Rexulti due to weight gain.  Continue lamotrigine  03/06/2022 appointment with the following noted: Last 2 weeks has been very sad and nervous. Trintellix was increased to 20 mg daily  03/10/2022 complaining of ongoing depression, feeling jittery, negative thinking, early morning awakening, ruminating about past decisions.  Wanting to consider TMS  because she is desperate for improvement. Plan we will schedule urgent appointment  03/15/22 urgent appt noted:  seen with Andrea [name] Very depressed, worst ever.  Don't want to be alone, ongoing worry, ruminating on past decisions.  Racing negative thoughts.  No motivation.  Trouble staying asleep.  Getting 4 hours and used to 9 hours. Dread getting up. Using Xanax Increased Trintellix to 20 mg 9 days ago. H agrees sleep is critical. Isolating.   Need to be better by July 11. Plan: Clonazepam 1 mg HS. Continue trintellix 20 mg daily she is only been on this dose 9 days and it needs more time. Re lamotrigine and increase to 150 mg daily.Marland Kitchen  Vraylar 1.5 mg every other day Option TMS  03/24/22 appt noted: So much better.  Thinking straighter and not negative and not sad.  Sleeping better 8-9 hours.. No crying. H notices progressively better every day.  Mind clarity is much better.   No SE noted.  No hangover.  No nausea. Found a therapist, Andrea [name].   Feels well enough to go to [place] and kind of excited.  Back July 16.   Plan: Clonazepam 1 mg HS. This resolved insomnia without SE Continue trintellix 20 mg daily she is only been on this dose 20 days and it needs more time. Re lamotriginecontinue 150 mg daily.Leafy Kindle 1.5 mg every other day Option TMS  04/12/22 appt noted: Great.  Went to [place].  Was herself and enjoyed it.  Anxiety was managed.   [facility] and liked her. 3# wt gain.   Patient reports stable mood and denies depressed or irritable moods.  Patient denies any recent difficulty with anxiety.  Patient denies difficulty with sleep initiation or maintenance. Denies appetite disturbance.  Patient reports that energy and motivation have been good.  Patient denies any difficulty with concentration.  Patient denies any suicidal ideation. Sleeping well than not anxious. Plan: Trintellix 20  07/10/22 TC depression returned recently.  08/15/22 appt  noted: Still blunted  and diminished motivation.  Some days better than others.  Going to gym.  Partially better.   Still has a lot of anxiety and doesn't want to be alone. Functioning but not normal.  Dreads parties.   SE tremor for a couple of week.sleep great.  Dep 7/10. Current Vraylar 1.5 mg daily, increase lamotrigine 200 mg daily, Trintellix 20 , clonazepam 0.5 mg HS Plan: Reduce Clonazepam 1/2  mg HS. This resolved insomnia without SE Continue trintellix 20 mg daily only mildly effective. lamotrigine continue 200 mg daily.Andrea [name] DT lost response Auvelity Stop Vraylar After Thanksgiving stop Trintellix Wait 3 days then [employer] 1 in the morning for 1 week,  and if no side effects then increase Auvelity to 1 in the AM and 1 in the PM  09/04/22 TC: Complaining of persistent depression and wanting to do something else to help.  Added lithium CR 300 mg nightly  09/11/2022 phone call complaining of no improvement with Auvelity and lithium.  Appointment was moved up.  09/13/22 appt noted: Nothing changed. No better and no worse.  Awakens sad and tearful.  Sleeps well. Pushing herself to the gym.  Has a trainer. On clonazepam 0.5 mg HS She remains anxious when alone for no apparent reason.  Feelings of inferiority.  She is normally extroverted but now she is wanting to isolate from people.  All tasks seem monumental.  No enjoyment and not looking forward to anything.  Everything is a Personal assistant.  She remains generally worried and anxious which is not typical for her. Plan: Clonazepam 1/2  mg HS. This resolved insomnia without SE Stop Auvelity Start nortriptyline 1 of the 25 mg capsules at night for 4 nights then 2 at night for 4 nights then 3 at night, then wait 1 week and get the blood test in the morning at [facility] Reduce lamotrigine to 1 and 1/2 tablets daily  Lamotrigine has not been helpful to him 100 mg daily.  Start reducing lamotrigine 150 mg daily and will continue to  taper at next appointment.  09/27/21 urgent appt :  she wanted urgent appt bc desperate to  feel better.  Seen with H Called at least twice since here for the same reasons of depression. Not sleeping well even with clonazepam 1 mg HS. Doesn't want to be alone. Shakey.   On lithium 300 mg daily, nortriptyline 75 mg HS.   Doesn't want to live like this but not acutely suicidal. Tolerating meds. Plan: pursue [facility] protocol TMS at [facility] bc faster optin than alternatives  10/30/22 TC: finished 50 TMS tx in the week and wants appt ASAP bc still struggling with crying and depresssion..  11/03/22 urgent appt : Completed TMS last week. Tue-Thur better days and then Friday nose-dived. Not done well since got back.   H notes high anxiety and crying in AM and PM and doesn't want to be alone.  Xanxax seems to help. Xanax helps and taking 0.5 mg AM and 1 mg HS H notes memory is sharper and she's more alert after treatment. Very anxioius all the time.  Get up sad.  Exercising. Sleep good with Xanax 1 mg HS. Plan: Increase Lexapro 20 mg dailly (should help crying and maybe anxiety but unlikely to resolve depression) Increase alprazolam to 0.5 mg TID and 1 mg HS for severe anxiety. H notes it helps. Pramipexole 0.25 BID for 5 days and if NR then 0.5 mg Bid off label. Reduce lamotrigine 100 mg for 2 weeks then 50 mg daily for 2 weeks then stop it. She wants to pursue Spravato if pramipexole fails  11/17/22 emergency work in appt: Pramipexole was not sent in and MD not aware until yesterday.  She is not any better and is desperate for a med change. Sleeping well and taking Xanax 1 mg HS Mornings are worse.  Not crying much.  Last Friday was a bad day and she didn't want to be alone.  But did let her H play golf once this week. Reduced lamotrigine today to 50 BID and weaning off. H says better this week than last week. Increased Lexapro to 20 mg daily. Plan: Increased Lexapro 20 mg dailly (helped crying  and maybe anxiety but unlikely to resolve depression) Continue alprazolam to 0.5 mg TID prn anxiety and 1 mg HS. H notes it helps. Pramipexole 0.25 mg BID for 3 days then 0.5 mg BID Reduce lamotrigine 50 mg daily for 2 weeks then stop it. She wants to pursue Spravato if pramipexole fails  12/25/22 appt urgently. COMPLETED 1 WEEK TMS [facility] without benefit except TMS took away brain fog.   She feels need to take Xanax 0.5 mg TID  Some sleepiness with Xanax . Mornings are better and fine while at the gym but when home gets depressed again.   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 Needs Xanax to do social things. Plan: Increased Lexapro 20 mg dailly (helped crying and maybe anxiety but unlikely to resolve depression) Continue alprazolam to 0.5 mg TID prn anxiety and 1 mg HS. H notes it helps. Change pramipexole 0.5mg  tablets, 1 tablet four times daily for 1 week,  Then if not better 1and 1/2 tablets in the AM and dinner and 1 tablet at lunch and dinner.    4/9-4/15 hosp for ruptured appendix and missed meds: Can restart Lexapro at 1/2 tablet daily for 3 days then 1 daily. Restart 1/2 pramipexole tablet 3 times daily for 3 days then 1 tablet 3 times daily for 3 days then 1 and 1/2 tablet 3 times daily.      01/12/23 appt noted: No GI sx now.  No appetite but  working on protein.   I think I'm doing real well mentally.  Has resumed meds.  Mood has been great. Pramipexole 0.5mg   1 and 1/2 TID Lexapro 20 Xanax reduced to 1 mg HS H sees a difference.   No SE with meds.   No med changes  01/25/23 appt noted: Meds:  pramipexole 0.75 mg TID, Xanax 1.5 mg HS and 0.5 mg prn, Lexapro 20 mg daily. Doing great.  Staples removed and healing from surgery appendectomy. Back in the gym helps her.   Satisfied with meds. Getting out socially and feeling like her old self.  Andrea's H Brad went to ER vomiting blood.   Plan: no med changes  02/07/23 appt noted: Back from [facility] yesterday.  Had ear infection.   Then conjunctivitis.  Eyes hurt.   Before the surgery was getting better and feels like she is sliding back some.  Gradually a little worse since here.  Wonders if she could get better with joy and social drive.   Off Abx .  Sleep is good with meds.  With depression then gets anxious too.  Is functioning.   Meds:  pramipexole 0.75 mg TID, Xanax 1.5 mg HS and 0.5 mg prn, Lexapro 20 mg daily. Consistent and no SE. Plan: Continue Lexapro 20 mg dailly (helped crying and maybe anxiety but unlikely to resolve depression) Continue alprazolam  1 mg HS. H notes it helps. Increase pramipexole 0.5mg  tablets, 2 tablets 3 times daily  01/31/23 TC:Andrea [name], CMA  to Me     02/20/23  4:56 PM Note Patient reporting she is not feeling as good as she thinks she should. Her anxiety is the biggest concern. She said she can go to the gym in the mornings and does ok, but she had a closing to go to recently and had to take 1/4 of a Xanax, reports it took the edge off. She reports daily tremors that are helped by Xanax. She is not crying, she is getting things done, sleeping okay. Will have her grandchildren next week and she isn't excited about it like she should be.    Taking 6 mirapex, 1.5 Xanax and Lexapro 20 mg.     MD resp:  CC   02/20/23  5:48 PM Note We just increased the pramipexole recently and I see her next week.  I do not want to increase the medicine any further until I see her again.      03/02/23 urgent appt noted: Some improvement with incr pramipexole 1 mg TID. Before felt worse in AM now feels better in AM and goes to gym. Otherwise no excitement.  Hard to make decisions. Less social and smiling.  Subdued more than normal.  Not confident.  Not enjoying present and ruminates in past.   No SE.  No impulsivity Has tremor for awhile .  Is mild for a month. Hard to conc.  Brain fog was better with TMS but is back. Continue Lexapro 20 mg dailly (helped crying and maybe anxiety but unlikely  to resolve depression) Continue alprazolam  1 mg HS. H notes it helps. Increase pramipexole 1 mg QID times daily  03/16/23 appt noted: Meds as above SE: If not enough food makes her excitable, urgent, talking fast.  Does it a bit too much.  Energy fine.   Feels better in the AM at the gym and when home a dark shadow comes over her.   Still feels dep overall.  Went to work with friends and  didn't feel natural and didn't enjoy it like she should.  Pushes herself to stay active.   Takes 1.5 mg Xanax HS and then awakens and takes another 0.5 mg HS but gets enough sleep. A lot of brain fog.  Feels inferior and doesn't usually feel that way. Infrequent crying spells. Plan:  Continue Lexapro 20 mg dailly (helped crying and maybe anxiety but unlikely to resolve depression) Continue alprazolam  1 mg HS. H notes it helps. Reduce pramipexole 1 mg TID for 3 days, then 0.5 mg QID for 3 days, then 0.5 mg BID for 3 days, then 0.25 mg BID for 3 days, then stop it. Now start Latuda (lurasidone) 40 mg 1/2 tablet for 1 week.  If not benefit then 1 tablet daily with 350 calories  04/13/23 urgent appt note: with [facility] for dep with SI "Worst I've ever been".  Feels out of control.  Shaking .  Can't sleep.  Feels disconnected.   Hated being hosp bc locked up.   Taking quetiapine 100 hS and lorazepam and still can't sleep. Started sertraline 50 and stopped Lexapro. Quetiapine 100 mg HS Plan: Stop hydroxyzine Increase quetiapine 200 then 300 mg at night for sleep and depression  Stop lorazepam and return to alprazolam 1 mg at night for sleep Increase sertraline to 100 mg daily or 2 of 50 mg nightly  04/23/23 appt noted: Here for first appt with Spravato.  Highly anxious about getting Spravato today.  Doesn't like feeling out of control and worries over this. Still very depressed.  Doesn't think her sx as noted are due to depression.  Thinks she has a neuro problem.  But she has no other neuro sx like  numbness, weakness, balance px, or anything other than dep and anxiety sx. Received Spravato 56 mg today.  "It was horrible!" Bc she felt out of control and was anxious receiving it.  Thought it would last forever re: dissociation.  It was scary to her.  Couldn't relax for it.  No N, V, HA.  Ambivalent about continuing Spravato but open. No specific concerns with meds.    04/25/23 appt noted: Needed Xanax before visit today to overcome fear of coming to the appt. She was more disciplined about controlling neg thought during Spravato admin and had a much better experience.  It was not scary nor associated with sig fear.  She is still dep and anxious without change otherwise since seen a couple of days ago. Tolerating med changes from last week.   Received Spravato 56 mg again and tolerated it without adverse SE.  Typical SE dissociation, no NV, palpitations, HA.  Motivated to continue the Spravato and agrees to increase it.     ECT-MADRS    Flowsheet Row Office Visit from 04/13/2023 in [facility] Office Visit from 11/03/2022 in [facility]  MADRS Total Score 45 40      PHQ2-9    Flowsheet Row Clinical Support from 02/24/2022 in [facility] Video Visit from 06/14/2021 in [facility] [place]  PHQ-2 Total Score 0 0  PHQ-9 Total Score -- 0      Flowsheet Row ED from 01/31/2023 in [facility] at [facility]  ED to Hosp-Admission (Discharged) from 01/02/2023 in [facility] ED from 12/03/2022 in [facility] at [facility] RISK CATEGORY No Risk No Risk No Risk  Past Psychiatric Medication Trials: Sertraline, fluoxetine, citalopram 50, nefazodone,  Lexapro 30 weight gain,  Viibryd 30, Wellbutrin 300 , Duloxetine 90 SE tremor, mirtazapine,  Trintellix 20 little response Auvelity twice daily for 3 weeks no  response Nortriptyline 75 level 86 NR Pramipexole 1 mg QID  poor resp and felt hyped  TMS  Lamotrigine 200 no response Abilify, Rexulti 0.5 Vraylar 1.5 daily tremor and lost response Lithium 300 shakes  methyl folate buspirone,  alprazolam, clonazepam, Under the care of [facility] since January 2001  Sister does well on lamotrigine.  Review of Systems:  Review of Systems  Constitutional:  Negative for appetite change.  Cardiovascular:  Negative for palpitations.  Gastrointestinal:  Negative for nausea.  Neurological:  Positive for tremors and light-headedness.  Psychiatric/Behavioral:  Positive for decreased concentration, dysphoric mood and sleep disturbance. Negative for behavioral problems, confusion, hallucinations, self-injury and suicidal ideas. The patient is nervous/anxious. The patient is not hyperactive.     Medications: I have reviewed the patient's current medications.  Current Outpatient Medications  Medication Sig Dispense Refill   ALPRAZolam (XANAX) 1 MG tablet Take 1 tablet (1 mg total) by mouth at bedtime. 30 tablet 0   atorvastatin (LIPITOR) 20 MG tablet Take 20 mg by mouth.      azithromycin (ZITHROMAX) 250 MG tablet Take 1 tablet (250 mg total) by mouth daily. Take first 2 tablets together, then 1 every day until finished. 6 tablet 0   calcium citrate-vitamin D (CITRACAL+D) 315-200 MG-UNIT tablet Take 1 tablet by mouth daily.     Esketamine HCl, 56 MG Dose, (SPRAVATO, 56 MG DOSE,) 28 MG/DEVICE SOPK Place 56 mg intranasally twice a week for one week.     Esketamine HCl, 84 MG Dose, (SPRAVATO, 84 MG DOSE,) 28 MG/DEVICE SOPK Place 84 mg into the nose every 3 (three) days.     fluticasone (FLONASE) 50 MCG/ACT nasal spray fluticasone propionate 50 mcg/actuation nasal spray,suspension     levothyroxine (SYNTHROID, LEVOTHROID) 50 MCG tablet Take 50 mcg by mouth daily before breakfast.     QUEtiapine (SEROQUEL) 100 MG tablet Take 3 tablets (300  mg total) by mouth at bedtime. 90 tablet 0   raloxifene (EVISTA) 60 MG tablet Take 60 mg by mouth daily.     sertraline (ZOLOFT) 50 MG tablet Take 2 tablets (100 mg total) by mouth at bedtime. 60 tablet 0   XIIDRA 5 % SOLN Place 1 drop into both eyes daily.     No current facility-administered medications for this visit.    Medication Side Effects: None  Allergies:  Allergies  Allergen Reactions   Hydrocodone-Acetaminophen Other (See Comments)    Passes out   Morphine Other (See Comments)    Passes out    Past Medical History:  Diagnosis Date   GAD (generalized anxiety disorder)    Hyperlipidemia    Hypothyroidism    Osteopenia    Sinusitis     Family History  Problem Relation Age of Onset   Arthritis Mother    COPD Mother    Hypercholesterolemia Mother    Dementia Mother    Stroke Mother    Prostate cancer Brother    Cancer Maternal Grandmother        Oral    Social History   Socioeconomic History   Marital status: Married    Spouse name: Not on file   Number of children: Not on file   Years of education: Not on file   Highest education level: Not on  file  Occupational History   Not on file  Tobacco Use   Smoking status: Never   Smokeless tobacco: Never  Vaping Use   Vaping status: Never Used  Substance and Sexual Activity   Alcohol use: Not Currently    Comment: social   Drug use: Never   Sexual activity: Not on file  Other Topics Concern   Not on file  Social History Narrative   Married.   1 child. 2 grandchildren.   Retired Once worked as a Psychologist, sport and exercise.   Enjoys exercising, spending time with family   Social Determinants of Health   Financial Resource Strain: Low Risk  (02/24/2022)   Overall Financial Resource Strain (CARDIA)    Difficulty of Paying Living Expenses: Not hard at all  Food Insecurity: No Food Insecurity (01/08/2023)   Hunger Vital Sign    Worried About Running Out of Food in the Last Year: Never true    Ran Out of Food in  the Last Year: Never true  Transportation Needs: No Transportation Needs (01/08/2023)   PRAPARE - Administrator, Civil Service (Medical): No    Lack of Transportation (Non-Medical): No  Physical Activity: Sufficiently Active (02/24/2022)   Exercise Vital Sign    Days of Exercise per Week: 5 days    Minutes of Exercise per Session: 90 min  Stress: No Stress Concern Present (02/24/2022)   [employer] of Occupational Health - Occupational Stress Questionnaire    Feeling of Stress : Not at all  Social Connections: Unknown (02/03/2022)   Received from [facility]   Social Network    Social Network: Not on file  Intimate Partner Violence: Not At Risk (01/02/2023)   Humiliation, Afraid, Rape, and Kick questionnaire    Fear of Current or Ex-Partner: No    Emotionally Abused: No    Physically Abused: No    Sexually Abused: No    Past Medical History, Surgical history, Social history, and Family history were reviewed and updated as appropriate.   3 grandsons.  Please see review of systems for further details on the patient's review from today.   Objective:   Physical Exam:  There were no vitals taken for this visit.  Physical Exam Constitutional:      General: She is not in acute distress. Musculoskeletal:        General: No deformity.  Neurological:     Mental Status: She is alert and oriented to person, place, and time.     Cranial Nerves: No dysarthria.     Coordination: Coordination normal.  Psychiatric:        Attention and Perception: Attention and perception normal. She does not perceive auditory or visual hallucinations.        Mood and Affect: Mood is anxious and depressed. Affect is not labile, blunt, angry or inappropriate.        Speech: Speech normal.        Behavior: Behavior normal. Behavior is cooperative.        Thought Content: Thought content normal. Thought content is not paranoid or delusional. Thought content does not include homicidal or  suicidal ideation. Thought content does not include suicidal plan.        Cognition and Memory: Cognition and memory normal.        Judgment: Judgment normal.     Comments: Insight intact Less desperate Less somatic     Lab Review:     Component Value Date/Time   NA 132 (L) 01/07/2023  0518   K 3.6 01/07/2023 0518   CL 102 01/07/2023 0518   CO2 25 01/07/2023 0518   GLUCOSE 97 01/07/2023 0518   BUN 5 (L) 01/07/2023 0518   CREATININE 0.54 01/07/2023 0518   CALCIUM 8.1 (L) 01/07/2023 0518   PROT 5.4 (L) 01/05/2023 0403   ALBUMIN 2.7 (L) 01/05/2023 0403   AST 24 01/05/2023 0403   ALT 23 01/05/2023 0403   ALKPHOS 37 (L) 01/05/2023 0403   BILITOT 0.5 01/05/2023 0403   GFRNONAA >60 01/07/2023 0518       Component Value Date/Time   WBC 7.4 01/05/2023 0403   RBC 3.22 (L) 01/05/2023 0403   HGB 10.2 (L) 01/05/2023 0403   HCT 30.4 (L) 01/05/2023 0403   PLT 239 01/05/2023 0403   MCV 94.4 01/05/2023 0403   MCH 31.7 01/05/2023 0403   MCHC 33.6 01/05/2023 0403   RDW 11.9 01/05/2023 0403   LYMPHSABS 1.6 01/02/2023 0602   MONOABS 0.8 01/02/2023 0602   EOSABS 0.1 01/02/2023 0602   BASOSABS 0.0 01/02/2023 0602    No results found for: "POCLITH", "LITHIUM"   No results found for: "PHENYTOIN", "PHENOBARB", "VALPROATE", "CBMZ"   .res Assessment: Plan:    Recurrent major depression resistant to treatment (HCC)  Generalized anxiety disorder  Insomnia due to mental condition   Ongoing depression with severe anxiety associated.  No reason for depression. Highly anxious with this.  Patient was administered Spravato 56 mg intranasally today.  The patient experienced the typical dissociation which gradually resolved over the 2-hour period of observation.  There were no complications.  Specifically the patient did not have nausea or vomiting or headache.  Blood pressures remained within normal ranges at the 40-minute and 2-hour follow-up intervals.  By the time the 2-hour observation  period was met the patient was alert and oriented and able to exit without assistance.   See nursing note for for details.   She has failed multiple meds as noted above including all the usual categories of antidepressants with the exception of  MAO inhibitors.  Consider Seroquel (not likely to be tolerated DT wt), Olanzapine Fluox Combo.   Consider Spravato,  Extensive discussion of next steps.   Including those above with SE of each disc.  We discussed the short-term risks associated with benzodiazepines including sedation and increased fall risk among others.  Discussed long-term side effect risk including dependence, potential withdrawal symptoms, and the potential eventual dose-related risk of dementia.  But recent studies from 2020 dispute this association between benzodiazepines and dementia risk. Newer studies in 2020 do not support an association with dementia.  Consider Genesight but unlikely to direct dosing decisions at this point.  quetiapine 300 mg at night for sleep and depression  Continue  alprazolam 1 mg at night for sleep sertraline 100 mg daily or 2 of 50 mg nightly  Increase  Spravato 84 mg AM twice weekly for severe TRD  Discussed safety plan at length with patient.  Advised patient to contact office with any worsening signs and symptoms.  Instructed patient to go to the [facility] emergency room for evaluation if experiencing any acute safety concerns, to include suicidal intent. Disc recent statements she couldn't live like this .  No sui intent.    Rec counseling .  She has a scheduled appt.  FU twice weekly.  Meredith Staggers, MD, DFAPA  Please see After Visit Summary for patient specific instructions.  Meredith Staggers, MD, DFAPA   Future Appointments  Date Time Provider  Department Center  05/02/2023  2:20 PM Doreene Nest, NP [facility]  05/17/2023  3:00 PM Cottle, Steva Ready., MD [facility] None       No orders of the defined types were placed in this  encounter.      -------------------------------me

## 2023-04-26 ENCOUNTER — Telehealth: Payer: Self-pay | Admitting: Psychiatry

## 2023-04-26 NOTE — Telephone Encounter (Signed)
Patient notified of recommendations. 

## 2023-04-26 NOTE — Telephone Encounter (Signed)
Patient taking 300 mg of Seroquel and 1 mg of Xanax for sleep. She can get to sleep, just can't stay asleep. Doesn't say how long she is sleeping, but says she needs 9 hours of sleep. Reporting social isolation is bad, she won't leave the house, but doesn't like being home alone.  Reports she has never been this sick mentally and feels like she may need to go to the hospital. No SI, just doesn't like being alone.

## 2023-04-26 NOTE — Telephone Encounter (Signed)
Pt just seen yesterday and received 2nd Spravato 56 mg .  That was positive experience. However she is still severely depressed and ruminating and reassurance seeking..  She doesn't need to go to the hospital. She told me yesterday she slept 7 hours but needs 9 hours.  7 hours is not bad.   She is taking quetiapne 300 mg HS with Xanax 1 mg HS.  No increase in Xanax but can increase quetiapine to 400 mg HS (max dose 800) and this could help sleep and anxiety and depression. She should keep appt next week for next Spravato.  In case she asks, Spravato is not making her worse in any way.

## 2023-04-26 NOTE — Telephone Encounter (Signed)
Patient lvm stating she is having difficulty sleeping. She is currently on Xanax 1 mg, but at this time it is not enough. Please advise.  # [phone]

## 2023-04-27 ENCOUNTER — Other Ambulatory Visit: Payer: Self-pay | Admitting: Psychiatry

## 2023-04-27 DIAGNOSIS — F339 Major depressive disorder, recurrent, unspecified: Secondary | ICD-10-CM

## 2023-04-29 ENCOUNTER — Other Ambulatory Visit: Payer: Self-pay | Admitting: Psychiatry

## 2023-04-29 DIAGNOSIS — F339 Major depressive disorder, recurrent, unspecified: Secondary | ICD-10-CM

## 2023-05-01 ENCOUNTER — Ambulatory Visit: Payer: Medicare Other

## 2023-05-01 ENCOUNTER — Ambulatory Visit (INDEPENDENT_AMBULATORY_CARE_PROVIDER_SITE_OTHER): Payer: Medicare Other | Admitting: Psychiatry

## 2023-05-01 VITALS — BP 144/91 | HR 103

## 2023-05-01 DIAGNOSIS — F411 Generalized anxiety disorder: Secondary | ICD-10-CM

## 2023-05-01 DIAGNOSIS — F339 Major depressive disorder, recurrent, unspecified: Secondary | ICD-10-CM | POA: Diagnosis not present

## 2023-05-01 DIAGNOSIS — F5105 Insomnia due to other mental disorder: Secondary | ICD-10-CM

## 2023-05-02 ENCOUNTER — Telehealth: Payer: Self-pay | Admitting: Primary Care

## 2023-05-02 ENCOUNTER — Encounter: Payer: Self-pay | Admitting: Primary Care

## 2023-05-02 ENCOUNTER — Ambulatory Visit (INDEPENDENT_AMBULATORY_CARE_PROVIDER_SITE_OTHER): Payer: Medicare Other | Admitting: Primary Care

## 2023-05-02 VITALS — BP 136/84 | HR 95 | Temp 98.1°F | Ht 62.0 in | Wt 133.0 lb

## 2023-05-02 DIAGNOSIS — E785 Hyperlipidemia, unspecified: Secondary | ICD-10-CM | POA: Diagnosis not present

## 2023-05-02 DIAGNOSIS — R251 Tremor, unspecified: Secondary | ICD-10-CM | POA: Insufficient documentation

## 2023-05-02 DIAGNOSIS — R911 Solitary pulmonary nodule: Secondary | ICD-10-CM

## 2023-05-02 DIAGNOSIS — Z820 Family history of epilepsy and other diseases of the nervous system: Secondary | ICD-10-CM | POA: Diagnosis not present

## 2023-05-02 DIAGNOSIS — F332 Major depressive disorder, recurrent severe without psychotic features: Secondary | ICD-10-CM | POA: Diagnosis not present

## 2023-05-02 DIAGNOSIS — M8580 Other specified disorders of bone density and structure, unspecified site: Secondary | ICD-10-CM | POA: Diagnosis not present

## 2023-05-02 DIAGNOSIS — R350 Frequency of micturition: Secondary | ICD-10-CM | POA: Diagnosis not present

## 2023-05-02 DIAGNOSIS — F411 Generalized anxiety disorder: Secondary | ICD-10-CM | POA: Diagnosis not present

## 2023-05-02 DIAGNOSIS — E039 Hypothyroidism, unspecified: Secondary | ICD-10-CM

## 2023-05-02 LAB — POC URINALSYSI DIPSTICK (AUTOMATED)
Bilirubin, UA: NEGATIVE
Blood, UA: NEGATIVE
Glucose, UA: NEGATIVE
Ketones, UA: NEGATIVE
Leukocytes, UA: NEGATIVE
Nitrite, UA: NEGATIVE
Protein, UA: NEGATIVE
Spec Grav, UA: <=1.005 — AB (ref 1.010–1.025)
Urobilinogen, UA: 0.2 E.U./dL
pH, UA: 7 (ref 5.0–8.0)

## 2023-05-02 NOTE — Assessment & Plan Note (Signed)
Controlled. Reviewed lipid panel from July 2024 through care everywhere.  Remain off treatment.

## 2023-05-02 NOTE — Assessment & Plan Note (Signed)
Reviewed TSH and thyroid studies from July 2024 through Care Everywhere. Continue levothyroxine 75 mcg daily.

## 2023-05-02 NOTE — Assessment & Plan Note (Signed)
Urinalysis today negative

## 2023-05-02 NOTE — Patient Instructions (Signed)
You will either be contacted via phone regarding your referral to neurology and psychiatry, or you may receive a letter on your MyChart portal from our referral team with instructions for scheduling an appointment. Please let us know if you have not been contacted by anyone within two weeks.  Proceed with your scheduled treatment with your psychiatrist.  It was a pleasure to see you today!

## 2023-05-02 NOTE — Assessment & Plan Note (Signed)
Mild resting tremor noted to bilateral hands and feet during visit.  Reviewed all labs from July 2024 through Care Everywhere.  Referral placed to neurology for further evaluation, especially with family history of Parkinson's disease.

## 2023-05-02 NOTE — Assessment & Plan Note (Signed)
Follows with GYN.  Continue Evista 60 mg daily.
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
## 2023-05-02 NOTE — Assessment & Plan Note (Addendum)
Uncontrolled.  Reviewed psychiatry notes from June 2021 through present day, and it appears that symptoms have been waxing and waning for years.  There is evidence of her feeling better throughout the years, it appears that Lexapro was most helpful.  Will continue to defer treatment to psychiatrist.  Continue Zoloft 100 mg daily, alprazolam 1 mg HS, Seroquel 300 mg HS, Spravato 84/56 mg as prescribed.  Advised that she should continue with the planned treatment regimen as sometimes multiple doses are required.   I also recommend that she have a frank discussion with her psychiatrist about her concerns regarding her treatment regimen.  Continue with therapy as scheduled.   Referral placed to new psychiatrist per patient request.   It is reasonable to rule out any neurological dysfunction that could be playing a role in her uncontrolled symptoms.  Referral placed to neurology.

## 2023-05-02 NOTE — Telephone Encounter (Signed)
Patient wanted to pass message along to Jae Dire that she di not want to be referred to Dr. Maryruth Bun in [place]. Patient did not say why, only that she did not want to be referred to this provider.

## 2023-05-02 NOTE — Assessment & Plan Note (Signed)
Uncontrolled.  Reviewed psychiatry notes from June 2021 through present day, and it appears that symptoms have been waxing and waning for years.  There is evidence of her feeling better throughout the years, it appears that Lexapro was most helpful.  Will continue to defer treatment to psychiatrist.  Continue Zoloft 100 mg daily, alprazolam 1 mg HS, Seroquel 300 mg HS, Spravato 84/56 mg as prescribed.  Advised that she should continue with the planned treatment regimen as sometimes multiple doses are required.   I also recommend that she have a frank discussion with her psychiatrist about her concerns regarding her treatment regimen.  Continue with therapy as scheduled.   Referral placed to new psychiatrist per patient request.   It is reasonable to rule out any neurological dysfunction that could be playing a role in her uncontrolled symptoms.  Referral placed to neurology.

## 2023-05-02 NOTE — Assessment & Plan Note (Signed)
Unchanged and benign according to CT chest from 2022.

## 2023-05-03 ENCOUNTER — Other Ambulatory Visit: Payer: Self-pay | Admitting: Psychiatry

## 2023-05-03 ENCOUNTER — Ambulatory Visit (INDEPENDENT_AMBULATORY_CARE_PROVIDER_SITE_OTHER): Payer: Medicare Other | Admitting: Psychiatry

## 2023-05-03 ENCOUNTER — Ambulatory Visit: Payer: Medicare Other

## 2023-05-03 ENCOUNTER — Encounter: Payer: Self-pay | Admitting: Psychiatry

## 2023-05-03 ENCOUNTER — Encounter: Payer: Medicare Other | Admitting: Psychiatry

## 2023-05-03 VITALS — BP 144/91 | HR 98

## 2023-05-03 DIAGNOSIS — F339 Major depressive disorder, recurrent, unspecified: Secondary | ICD-10-CM

## 2023-05-03 DIAGNOSIS — F5105 Insomnia due to other mental disorder: Secondary | ICD-10-CM

## 2023-05-03 DIAGNOSIS — F411 Generalized anxiety disorder: Secondary | ICD-10-CM

## 2023-05-03 MED ORDER — ALPRAZOLAM 1 MG PO TABS
ORAL_TABLET | ORAL | 0 refills | Status: DC
Start: 2023-05-03 — End: 2023-05-17

## 2023-05-03 NOTE — Telephone Encounter (Signed)
Noted  Referral canceled

## 2023-05-03 NOTE — Telephone Encounter (Signed)
Ashtyn, will you cancel the psychiatry referral that was placed on 08/07?

## 2023-05-03 NOTE — Progress Notes (Signed)
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 Once worked as a Psychologist, sport and exercise.   Enjoys exercising, spending time with family   Social Determinants of Health   Financial Resource Strain: Low Risk  (02/24/2022)   Overall Financial Resource Strain (CARDIA)    Difficulty of Paying Living Expenses: Not hard at all  Food Insecurity: No Food Insecurity (01/08/2023)   Hunger Vital Sign    Worried About Running Out of Food in the Last Year: Never true    Ran Out of Food in the Last Year: Never true  Transportation Needs: No Transportation Needs (01/08/2023)   PRAPARE - Administrator, Civil Service (Medical): No    Lack of Transportation (Non-Medical): No  Physical Activity: Sufficiently Active (02/24/2022)   Exercise Vital Sign    Days of Exercise per Week: 5 days    Minutes of Exercise per Session: 90 min  Stress: No Stress Concern Present  (02/24/2022)   [name] [employer] - Occupational Stress Questionnaire    Feeling of Stress : Not at all  Social Connections: Unknown (02/03/2022)   Received from [name]   Social Network    Social Network: Not on file  Intimate Partner Violence: Not At Risk (01/02/2023)   Humiliation, Afraid, Rape, and Kick questionnaire  Fear of Current or Ex-Partner: No    Emotionally Abused: No    Physically Abused: No    Sexually Abused: No    Past Medical History, Surgical history, Social history, and Family history were reviewed and updated as appropriate.   3 grandsons.  Please see review of systems for further details on the patient's review from today.   Objective:   Physical Exam:  There were no vitals taken for this visit.  Physical Exam Constitutional:      General: She is not in acute distress. Musculoskeletal:        General: No deformity.  Neurological:     Mental Status: She is alert and oriented to person, place, and time.     Cranial Nerves: No dysarthria.     Coordination: Coordination normal.  Psychiatric:        Attention and Perception: Attention and perception normal. She does not perceive auditory or visual hallucinations.        Mood and Affect: Mood is anxious and depressed. Affect is not labile, blunt, angry or inappropriate.        Speech: Speech normal.        Behavior: Behavior normal. Behavior is cooperative.        Thought Content: Thought content normal. Thought content is not paranoid or delusional. Thought content does not include homicidal or suicidal ideation. Thought content does not include suicidal plan.        Cognition and Memory: Cognition and memory normal.     Comments: Insight fair and judgment impaired from dep. Severe dep and rumination and severely anxious      Lab Review:     Component Value Date/Time   NA 132 (L) 01/07/2023 0518   K 3.6 01/07/2023 0518   CL 102 01/07/2023 0518   CO2 25  01/07/2023 0518   GLUCOSE 97 01/07/2023 0518   BUN 5 (L) 01/07/2023 0518   CREATININE 0.54 01/07/2023 0518   CALCIUM 8.1 (L) 01/07/2023 0518   PROT 5.4 (L) 01/05/2023 0403   ALBUMIN 2.7 (L) 01/05/2023 0403   AST 24 01/05/2023 0403   ALT 23 01/05/2023 0403   ALKPHOS 37 (L) 01/05/2023 0403   BILITOT 0.5 01/05/2023 0403   GFRNONAA >60 01/07/2023 0518       Component Value Date/Time   WBC 7.4 01/05/2023 0403   RBC 3.22 (L) 01/05/2023 0403   HGB 10.2 (L) 01/05/2023 0403   HCT 30.4 (L) 01/05/2023 0403   PLT 239 01/05/2023 0403   MCV 94.4 01/05/2023 0403   MCH 31.7 01/05/2023 0403   MCHC 33.6 01/05/2023 0403   RDW 11.9 01/05/2023 0403   LYMPHSABS 1.6 01/02/2023 0602   MONOABS 0.8 01/02/2023 0602   EOSABS 0.1 01/02/2023 0602   BASOSABS 0.0 01/02/2023 0602    No results found for: "POCLITH", "LITHIUM"   No results found for: "PHENYTOIN", "PHENOBARB", "VALPROATE", "CBMZ"   .res Assessment: Plan:    Recurrent major depression resistant to treatment (HCC)  Generalized anxiety disorder  Insomnia due to mental condition   Ongoing depression with severe anxiety associated.  Severe rumination.  No reason for depression. Highly anxious with this.  Patient was administered Spravato 84 mg intranasally today.  The patient experienced the typical dissociation which gradually resolved over the 2-hour period of observation.  There were no complications.  Specifically the patient did not have nausea or vomiting or headache.  Blood pressures remained within normal ranges at the 40-minute and 2-hour follow-up intervals.  By the  time the 2-hour observation period was met the patient was alert and oriented and able to exit without assistance.   See nursing note for for details.  However she is terrified of everything including the Spravato.  Disc alternative of ECT.  Spravato is still the best option while continuing to adjust quetiapine.   She has failed multiple meds as noted above including  all the usual categories of antidepressants with the exception of  MAO inhibitors.  Consider Seroquel (not likely to be tolerated DT wt), Olanzapine Fluox Combo.   Consider Spravato,  Extensive discussion of next steps.   Including those above with SE of each disc.  We discussed the short-term risks associated with benzodiazepines including sedation and increased fall risk among others.  Discussed long-term side effect risk including dependence, potential withdrawal symptoms, and the potential eventual dose-related risk of dementia.  But recent studies from 2020 dispute this association between benzodiazepines and dementia risk. Newer studies in 2020 do not support an association with dementia.  Consider Genesight but unlikely to direct dosing decisions at this point.   Increased quetiapine to 400 mg at night for sleep and depression & rumination increase alprazolam 1 mg at night for sleep and 0.5 mg TID sertraline 100 mg daily or 2 of 50 mg nightly  Increased Spravato 84 mg AM twice weekly for severe TRD  Discussed safety plan at length with patient.  Advised patient to contact office with any worsening signs and symptoms.  Instructed patient to go to the [facility] emergency room for evaluation if experiencing any acute safety concerns, to include suicidal intent. Disc recent statements she couldn't live like this .  No sui intent.    Rec counseling .  She has a scheduled appt but Spravato is interfering with scheduling at the moment.  FU twice weekly with Spravato 84 mg daily.  Meredith Staggers, MD, DFAPA  Please see After Visit Summary for patient specific instructions.  Meredith Staggers, MD, DFAPA   Future Appointments  Date Time Provider Department Center  05/17/2023  3:00 PM Cottle, Steva Ready., MD [facility] None       No orders of the defined types were placed in this encounter.      -------------------------------me

## 2023-05-03 NOTE — Telephone Encounter (Signed)
Received MyChart message earlier from patient with the same information. Referral coordinator was notified.

## 2023-05-04 ENCOUNTER — Telehealth: Payer: Self-pay | Admitting: Psychiatry

## 2023-05-04 NOTE — Telephone Encounter (Signed)
Appt 08/22. Lucilla Edin called and said she is running out of her Xanax. Could this be called into:  [id number] [place], [place] - [address]   Phone: [phone]  Fax: [phone]    Please call her at [phone] and let her know that it is at pharmacy.

## 2023-05-04 NOTE — Telephone Encounter (Signed)
It was sent yesterday

## 2023-05-06 ENCOUNTER — Encounter: Payer: Self-pay | Admitting: Psychiatry

## 2023-05-06 MED ORDER — QUETIAPINE FUMARATE 100 MG PO TABS
500.0000 mg | ORAL_TABLET | Freq: Every day | ORAL | Status: DC
Start: 2023-05-06 — End: 2023-05-08

## 2023-05-06 NOTE — Progress Notes (Signed)
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        Oral    Social History   Socioeconomic History   Marital status: Married    Spouse name: Not on file   Number of children: Not on file   Years of education: Not on file   Highest education level: Not on file  Occupational History   Not on file  Tobacco Use   Smoking status: Never   Smokeless tobacco: Never  Vaping Use   Vaping status: Never Used  Substance and Sexual Activity   Alcohol use: Not Currently    Comment: social   Drug use: Never   Sexual activity: Not on file  Other Topics Concern   Not on file  Social History Narrative   Married.   1 child. 2 grandchildren.   Retired Once worked as a Psychologist, sport and exercise.   Enjoys exercising, spending time with family   Social Determinants of Health   Financial Resource Strain: Low Risk  (02/24/2022)   Overall  Financial Resource Strain (CARDIA)    Difficulty of Paying Living Expenses: Not hard at all  Food Insecurity: No Food Insecurity (01/08/2023)   Hunger Vital Sign    Worried About Running Out of Food in the Last Year: Never true    Ran Out of Food in the Last Year: Never true  Transportation Needs:  No Transportation Needs (01/08/2023)   PRAPARE - Administrator, Civil Service (Medical): No    Lack of Transportation (Non-Medical): No  Physical Activity: Sufficiently Active (02/24/2022)   Exercise Vital Sign    Days of Exercise per Week: 5 days    Minutes of Exercise per Session: 90 min  Stress: No Stress Concern Present (02/24/2022)   [employer] of Occupational Health - Occupational Stress Questionnaire    Feeling of Stress : Not at all  Social Connections: Unknown (02/03/2022)   Received from [facility], [facility]   Social Network    Social Network: Not on file  Intimate Partner Violence: Not At Risk (01/02/2023)   Humiliation, Afraid, Rape, and Kick questionnaire    Fear of Current or Ex-Partner: No    Emotionally Abused: No    Physically Abused: No    Sexually Abused: No    Past Medical History, Surgical history, Social history, and Family history were reviewed and updated as appropriate.   3 grandsons.  Please see review of systems for further details on the patient's review from today.   Objective:   Physical Exam:  There were no vitals taken for this visit.  Physical Exam Constitutional:      General: She is not in acute distress. Musculoskeletal:        General: No deformity.  Neurological:     Mental Status: She is alert and oriented to person, place, and time.     Cranial Nerves: No dysarthria.     Coordination: Coordination normal.  Psychiatric:        Attention and Perception: Attention and perception normal. She does not perceive auditory or visual hallucinations.        Mood and Affect: Mood is anxious and depressed. Affect is not labile,  blunt, angry or inappropriate.        Speech: Speech normal.        Behavior: Behavior normal. Behavior is cooperative.        Thought Content: Thought content normal. Thought content is not paranoid or delusional. Thought content does not include homicidal or suicidal ideation. Thought content does not include suicidal plan.        Cognition and Memory: Cognition and memory normal.     Comments: Insight fair and judgment impaired from dep. Severe dep and rumination and severely anxious onging      Lab Review:     Component Value Date/Time   NA 132 (L) 01/07/2023 0518   K 3.6 01/07/2023 0518   CL 102 01/07/2023 0518   CO2 25 01/07/2023 0518   GLUCOSE 97 01/07/2023 0518   BUN 5 (L) 01/07/2023 0518   CREATININE 0.54 01/07/2023 0518   CALCIUM 8.1 (L) 01/07/2023 0518   PROT 5.4 (L) 01/05/2023 0403   ALBUMIN 2.7 (L) 01/05/2023 0403   AST 24 01/05/2023 0403   ALT 23 01/05/2023 0403   ALKPHOS 37 (L) 01/05/2023 0403   BILITOT 0.5 01/05/2023 0403   GFRNONAA >60 01/07/2023 0518       Component Value Date/Time   WBC 7.4 01/05/2023 0403   RBC 3.22 (L) 01/05/2023 0403   HGB 10.2 (L) 01/05/2023 0403   HCT 30.4 (L) 01/05/2023 0403   PLT 239 01/05/2023 0403   MCV 94.4 01/05/2023 0403   MCH 31.7 01/05/2023 0403   MCHC 33.6 01/05/2023 0403   RDW 11.9 01/05/2023 0403   LYMPHSABS 1.6 01/02/2023 0602   MONOABS 0.8 01/02/2023 0602   EOSABS 0.1 01/02/2023 0602  BASOSABS 0.0 01/02/2023 0602    No results found for: "POCLITH", "LITHIUM"   No results found for: "PHENYTOIN", "PHENOBARB", "VALPROATE", "CBMZ"   .res Assessment: Plan:    Recurrent major depression resistant to treatment (HCC) - Plan: QUEtiapine (SEROQUEL) 100 MG tablet  Generalized anxiety disorder  Insomnia due to mental condition - Plan: QUEtiapine (SEROQUEL) 100 MG tablet   Ongoing depression with severe anxiety associated.  Severe rumination.  No reason for depression. Highly anxious with this.  Requiring  extended time before Spravato administration answering her repetitive questions borne out of fear and anxiety.  she is terrified of everything including the Spravato.  Disc alternative of ECT.  Spravato is still the best option while continuing to adjust quetiapine.  Patient was administered Spravato 84 mg intranasally today.  The patient experienced the typical dissociation which gradually resolved over the 2-hour period of observation.  There were no complications.  Specifically the patient did not have nausea or vomiting or headache.  Blood pressures remained within normal ranges at the 40-minute and 2-hour follow-up intervals.  By the time the 2-hour observation period was met the patient was alert and oriented and able to exit without assistance.   See nursing note for for details.    She has failed multiple meds as noted above including all the usual categories of antidepressants with the exception of  MAO inhibitors.  Consider Olanzapine Fluox Combo.   No benefit so far with quetiapine.  We discussed the short-term risks associated with benzodiazepines including sedation and increased fall risk among others.  Discussed long-term side effect risk including dependence, potential withdrawal symptoms, and the potential eventual dose-related risk of dementia.  But recent studies from 2020 dispute this association between benzodiazepines and dementia risk. Newer studies in 2020 do not support an association with dementia.  Consider Genesight but unlikely to direct dosing decisions at this point.   Increase quetiapine to 500 mg at night for sleep and depression & rumination and next week to 600 HS increased alprazolam 1 mg at night for sleep and 0.5 mg TID sertraline 100 mg daily or 2 of 50 mg nightly  Increased Spravato 84 mg AM twice weekly for severe TRD  Discussed safety plan at length with patient.  Advised patient to contact office with any worsening signs and symptoms.  Instructed patient to  go to the [facility] emergency room for evaluation if experiencing any acute safety concerns, to include suicidal intent. Disc recent statements she couldn't live like this .  No sui intent.    Rec counseling .  She has a scheduled appt but Spravato is interfering with scheduling at the moment.  H agrees and supports the plan  FU twice weekly with Spravato 84 mg daily.  Meredith Staggers, MD, DFAPA  Please see After Visit Summary for patient specific instructions.  Meredith Staggers, MD, DFAPA   Future Appointments  Date Time Provider Department Center  05/08/2023  1:30 PM Cottle, Steva Ready., MD [facility] None  05/08/2023  1:30 PM CP-NURSE [facility] None  05/10/2023  1:30 PM Cottle, Steva Ready., MD [facility] None  05/10/2023  1:30 PM CP-NURSE CP-[facility] None  05/17/2023  3:00 PM Cottle, Steva Ready., MD [facility] None       No orders of the defined types were placed in this encounter.      -------------------------------me

## 2023-05-07 ENCOUNTER — Other Ambulatory Visit: Payer: Self-pay | Admitting: Psychiatry

## 2023-05-07 DIAGNOSIS — F5105 Insomnia due to other mental disorder: Secondary | ICD-10-CM

## 2023-05-07 DIAGNOSIS — F339 Major depressive disorder, recurrent, unspecified: Secondary | ICD-10-CM

## 2023-05-07 NOTE — Progress Notes (Signed)
NURSES NOTE:           Pt arrived for her #3 Spravato Treatment for treatment resistant depression, the starting dose was 56 mg (2 of the 28 mg nasal sprays) and she did receive 2 treatments of that last week, today she will get her 1st 84 mg. Pt arrived very anxious and nervous about Spravato, reassured her that there has been good success, gave her examples of other pt's experiences. She did take Alprazolam 0.5 mg prior to coming but instructed her to take the additional 1/2 tablet to be able to get her started, this won't be a long term recommendation but until she is feeling better about the treatments. Explained to pt and her husband how the treatments would be scheduled and answered any questions they had today. Pt's Spravato is Research officer, political party through [employer] Spravato medication is stored at treatment center per REMS/FDA guidelines. The medication is required to be locked behind two doors per REMS/FDA protocol. Medication is also disposed of properly after each use per regulations. All treatments are charted per FDA/REMS guidelines in Spravato Rems.          Began taking patient's vital signs at 2:15 PM 149/82, pulse 97, pulse ox 97%. Pt didn't recline back she feels better with her feet on the ground but she did raise her head back when getting the nasal sprays. Gave patient first dose 28 mg nasal spray, administered in each nostril as directed and observed by nurse, waited 5 more minutes for the second and third dose. After all 3 doses given pt did not complain of any nausea/vomiting, pt did have a drink and some candy to help with the taste of Spravato it gives the metallic taste. Pt remained sitting up in chair when nurse assessed her 40 minute vitals, she did not speak and kept her eyes closed, I felt she did better today and less anxious at this point, 3:00 PM, 154/98, pulse 103. Explained he would be monitored for a total time of 120 minutes. Discharge vitals were taken at 4:20 PM  144/91, P 103. Dr. Jennelle Human came to visit with patient at end of her treatment. Recommend she go home and sleep or just relax on the couch. No driving, no intense activities. Pt. will be receiving 2 treatments per week for 4 weeks as recommended. Nurse was with pt a total of 60 minutes for clinical assessment. Pt is scheduled next week on Thursday, August 8th. Pt instructed to call office with any problems or questions prior to next treatment. Pt left with husband.       LOT [id number] EXP OCT 2026

## 2023-05-07 NOTE — Progress Notes (Unsigned)
Assessment/Plan:   Tremor  -This is likely multifactorial.  Patient has quite significant anxiety, which certainly can generate tremor.  She is working with Dr. Jennelle Human faithfully on treatments for her anxiety and depression and has just been referred for ECT.  In addition, she has been on numerous medications that can cause and worsen tremor.  She has just d/c fairly high dose quetiapine and just started zyprexa  -reassured her that no evidence of Parkinsons Disease.    -neuro exam is non focal and non lateralizing today  -While we discussed medications such as propranolol and primidone, I did not recommend these.  Propranolol can potentially worsen depression.  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  Her medicines can contribute as well.  We talked about neurocognitive testing, and she would like to proceed.  Subjective:   Andrea [name] was seen today in the movement disorders clinic for neurologic consultation at the request of Doreene Nest, NP.  The consultation is for the evaluation of tremor. This patient is accompanied in the office by her spouse who supplements the history.  Patient is a 72 year old female with a history of hyperlipidemia, hypothyroidism, generalized anxiety disorder, major depression who presents today to discuss tremor.  Medical records made available to me are reviewed.  Patient has been under the care of Dr. Jennelle Human for many years (2 decades per his account).  He has been following her quite frequently for treatment resistant anxiety and depression.  Over the years, his notes do address tremors associated with various medications, including Vraylar and lithium.  Notes also indicate that patient had become very fearful that she had a primary neurologic disorder, but psychiatry felt that this was most certainly not the case and the anxiety and depression  were the issue.  Dr. Jennelle Human also sent me a note in this regard, recognizing that I was seeing the patient today.  He noted pt had severe depression/anxiety with rumination and has just been referred to [facility] ECT program.  Pt states today that father had Parkinsons Disease and she worries about this.  She wonders if her brother had it but he was never dx with that before he died.  She reports he did see a neurologist and was on psychiatric medications, including lithium.  Tremor: Yes.     How long has it been going on? Husband thinks 1 year  At rest or with activation?  "I notice tremor all the time but I've always had catastrophic thinking."  Husband only able to see if if she points it out  Fam hx of tremor?  Yes.  , brother had tremor (saw a neuro and not dx with Parkinsons Disease)  Located where?  Bilateral UE - she is R handed  Affected by caffeine:  doesn't drink any  Affected by alcohol:  doesn't drink any  Affected by stress:  Yes.  , increases it  Affected by fatigue:  No.  Spills soup if on spoon:  No.  Spills glass of liquid if full:  No.  Affects ADL's (tying shoes, brushing teeth, etc):  No.  Tremor inducing meds:  quetiapine, 600 mg (has been off of this x 2 weeks); zyprexa  Other psychiatric medication trials: Abilify in 2022, but not for a long; Rexulti; Lamictal; duloxetine Trintellix; Vraylar Lamictal; nortriptyline; TMS in early 2024; Lexapro; pramipexole (up to 1 mg 4 times  per day); Latuda in June, 2024; spravato; lithium; currently zyprexa  Other Specific Symptoms:  Voice: no change  Sleep: trouble sleeping (trouble getting back to sleep)  Vivid Dreams:  No.  Acting out dreams:  No. Postural symptoms:  No.  Falls?  No. Bradykinesia symptoms: no bradykinesia noted  Loss of smell:  No. Loss of taste:  No. Urinary Incontinence:  No. Difficulty Swallowing:  No. Handwriting, micrographia: No. But its shaky N/V:  No. Lightheaded:  one time at the gym and was on new  medication and got up too fast.  Pt thinks she doesn't have a lot of dizziness but husband thinks it has become more frequent Diplopia:  No. Memory change: Yes, patient worries about her memory.  States that Dr. Jennelle Human told her that she had "brain fog" from her medications.  She worries it is something more than this.  Her husband does not think that there has been any significant degree of memory change.  Neuroimaging of the brain has not previously been performed.  Her husband thinks that this is part of the issue - she wants one to not worry.    ALLERGIES:   Allergies  Allergen Reactions   Hydrocodone-Acetaminophen Other (See Comments)    Passes out   Morphine Other (See Comments)    Passes out    CURRENT MEDICATIONS:  Current Outpatient Medications  Medication Instructions   ALPRAZolam (XANAX) 1 MG tablet 1/2 tablet three times daily and 1 tablet at night   atorvastatin (LIPITOR) 20 mg, Oral   calcium citrate-vitamin D (CITRACAL+D) 315-200 MG-UNIT tablet 1 tablet, Oral, Daily   fluticasone (FLONASE) 50 MCG/ACT nasal spray fluticasone propionate 50 mcg/actuation nasal spray,suspension   minoxidil (LONITEN) 2.5 mg, Oral, Daily   OLANZapine (ZYPREXA) 10 mg, Oral, Daily at bedtime   QUEtiapine (SEROQUEL) 600 mg, Oral, Daily at bedtime   raloxifene (EVISTA) 60 mg, Oral, Daily   sertraline (ZOLOFT) 150 mg, Oral, Daily at bedtime   Spravato (84 MG Dose) 84 mg, Nasal, Every 3 DAYS   XIIDRA 5 % SOLN 1 drop, Daily    Objective:   PHYSICAL EXAMINATION:    VITALS:   Vitals:   05/16/23 1001  BP: (!) 140/90  Pulse: 94  SpO2: 96%  Weight: 129 lb 3.2 oz (58.6 kg)  Height: 5\' 1"  (1.549 m)    GEN:  The patient appears stated age and is in NAD. HEENT:  Normocephalic, atraumatic.  The mucous membranes are moist. The superficial temporal arteries are without ropiness or tenderness. CV:  RRR Lungs:  CTAB Neck/HEME:  There are no carotid bruits bilaterally.  Neurological  examination:  Orientation: The patient is alert and oriented x3.  She provides her detailed history well Cranial nerves: There is good facial symmetry.  Extraocular muscles are intact. The visual fields are full to confrontational testing. The speech is fluent and clear. Soft palate rises symmetrically and there is no tongue deviation. Hearing is intact to conversational tone. Sensation: Sensation is intact to light touch throughout (facial, trunk, extremities). Vibration is intact at the bilateral ankle. There is no extinction with double simultaneous stimulation.  Motor: Strength is 5/5 in the bilateral upper and lower extremities.   Shoulder shrug is equal and symmetric.  There is no pronator drift. Deep tendon reflexes: Deep tendon reflexes are 2-2+/4 at the bilateral biceps, triceps, brachioradialis, patella and achilles. Plantar responses are downgoing bilaterally.  Movement examination: Tone: There is normal tone in the bilateral upper extremities.  The tone in the  lower extremities is normal.  Abnormal movements: The patient has mild postural tremor.  It does not appear to increase with intention.  Archimedes spirals are drawn well.  She is able to pour water from 1 glass to another.     Coordination:  There is no decremation with RAM's, with any form of RAMS, including alternating supination and pronation of the forearm, hand opening and closing, finger taps, heel taps and toe taps.  Gait and Station: The patient has no difficulty arising out of a deep-seated chair without the use of the hands. The patient's stride length is good with good stride length and good armswing bilaterally.  She is able to ambulate in a tandem fashion. I have reviewed and interpreted the following labs independently   Chemistry      Component Value Date/Time   NA 132 (L) 01/07/2023 0518   K 3.6 01/07/2023 0518   CL 102 01/07/2023 0518   CO2 25 01/07/2023 0518   BUN 5 (L) 01/07/2023 0518   CREATININE 0.54  01/07/2023 0518      Component Value Date/Time   CALCIUM 8.1 (L) 01/07/2023 0518   ALKPHOS 37 (L) 01/05/2023 0403   AST 24 01/05/2023 0403   ALT 23 01/05/2023 0403   BILITOT 0.5 01/05/2023 0403      No results found for: "TSH" Lab Results  Component Value Date   WBC 7.4 01/05/2023   HGB 10.2 (L) 01/05/2023   HCT 30.4 (L) 01/05/2023   MCV 94.4 01/05/2023   PLT 239 01/05/2023      Total time spent on today's visit was 63 minutes, including both face-to-face time and nonface-to-face time.  Time included that spent on review of records (prior notes available to me/labs/imaging if pertinent), discussing treatment and goals, answering patient's questions and coordinating care.  Cc:  Doreene Nest, NP

## 2023-05-07 NOTE — Progress Notes (Signed)
NURSES NOTE:           Pt arrived for her #4 Spravato Treatment for treatment resistant depression, the starting dose was 56 mg (2 of the 28 mg nasal sprays) and she did receive 2 treatments of that last week, today she will get her 2nd 84 mg. Pt arrived very anxious and nervous about Spravato. Husband reports pt cried last night about having to come for her treatment today. Pt very upset. Pt reports having a headache.  Reassured her that there has been good success, gave her examples of other pt's experiences. Pt's Spravato is Research officer, political party through [employer] Spravato medication is stored at treatment center per REMS/FDA guidelines. The medication is required to be locked behind two doors per REMS/FDA protocol. Medication is also disposed of properly after each use per regulations. All treatments are charted per FDA/REMS guidelines in Spravato Rems.          Began taking patient's vital signs at 1:40 PM 157/96, pulse 93, pulse ox 97%. Dr. Jennelle Human advised for her to take Clonidine 0.1 mg as well as another Alprazolam 0.5 mg. Pt was instructed to take a whole 1 mg at 12:30 prior to coming to treatment but she only took 0.5 mg and took another 0.5 mg at 10:30 AM. Pt instructed to relax and let the medication work so we could get her treatment started, she really did not want to have her treatment today. Rechecked vitals again at 2:25 PM and still running high, 153/97, pulse 88. Dr. Jennelle Human recommend try again by 3:00 PM and if not improved she will have to reschedule, pt and husband aware. Rechecked vitals at 3:00 PM 149/84, pulse 88. Pt at that point was too anxious and didn't want to start then, Dr. Jennelle Human came over to talk to pt and she agreed, Started at 3: 20 PM, first dose 28 mg nasal spray, administered in each nostril as directed and observed by nurse, waited 5 more minutes for the second and third dose. After all 3 doses given pt did not complain of any nausea/vomiting, pt did have a drink  and some candy to help with the taste of Spravato it gives the metallic taste. Pt reclined back today to hopefully help, she was very relaxed due to the extra medication. Pt's husband decided to not stay in the room today with her. Pt listened to her relaxing music on her ear buds. Assessed her 40 minute vitals, she did not speak and kept her eyes closed, 4:00 PM, 140/85, pulse 88. Explained he would be monitored for a total time of 120 minutes. Discharge vitals were taken at 5:30 PM 144/91, P 98. Dr. Jennelle Human came to visit with patient at end of her treatment. Recommend she go home and sleep or just relax on the couch. No driving, no intense activities. Pt. will be receiving 2 treatments per week for 4 weeks as recommended. Nurse was with pt a total of 60 minutes for clinical assessment. Pt is scheduled next week on Tuesday and Thursday, August 13 th and 15 th. Pt instructed to call office with any problems or questions prior to next treatment. Pt left with husband.       LOT [id number] EXP OCT 2026

## 2023-05-08 ENCOUNTER — Ambulatory Visit (INDEPENDENT_AMBULATORY_CARE_PROVIDER_SITE_OTHER): Payer: Medicare Other | Admitting: Psychiatry

## 2023-05-08 ENCOUNTER — Ambulatory Visit: Payer: Medicare Other

## 2023-05-08 ENCOUNTER — Encounter: Payer: Self-pay | Admitting: Psychiatry

## 2023-05-08 VITALS — BP 154/82 | HR 95

## 2023-05-08 DIAGNOSIS — F339 Major depressive disorder, recurrent, unspecified: Secondary | ICD-10-CM

## 2023-05-08 DIAGNOSIS — F5105 Insomnia due to other mental disorder: Secondary | ICD-10-CM

## 2023-05-08 DIAGNOSIS — F411 Generalized anxiety disorder: Secondary | ICD-10-CM

## 2023-05-08 MED ORDER — QUETIAPINE FUMARATE 300 MG PO TABS
600.0000 mg | ORAL_TABLET | Freq: Every day | ORAL | 1 refills | Status: AC
Start: 2023-05-08 — End: ?

## 2023-05-08 MED ORDER — SERTRALINE HCL 100 MG PO TABS: 150.0000 mg | ORAL_TABLET | Freq: Every day | ORAL | 0 refills | Status: DC

## 2023-05-08 NOTE — Progress Notes (Signed)
NURSES NOTE:           Pt arrived for her #5 Spravato Treatment for treatment resistant depression, the starting dose was 56 mg (2 of the 28 mg nasal sprays) and she did receive 2 treatments of that last week, today she will get her 3rd  84 mg. Pt arrived anxious about Spravato.  Pt's Spravato is [employer], [employer] through [employer] Spravato medication is stored at treatment center per REMS/FDA guidelines. The medication is required to be locked behind two doors per REMS/FDA protocol. Medication is also disposed of properly after each use per regulations. All treatments are charted per FDA/REMS guidelines in Spravato R[facility].          Began taking patient's vital signs at 1:25 PM 136/80, pulse 97, pulse ox 97%. pt remained anxious and asking questions, concerned about hair loss and reassured her it wasn't related to Spravato. Advised her it is probably from stress, she is not on any medication to cause that.  Pt given a blanket and pillow, she put her ear buds in and I helped her recline back. Pt's husband did not stay in the room again. Pt given first dose 28 mg nasal spray, administered in each nostril as directed and observed by nurse, waited 5 more minutes for the second and third dose. After all 3 doses given pt did not complain of any nausea/vomiting, pt did have a drink and some candy to help with the taste of Spravato it gives the metallic taste. Pt reclined back today. Pt listened to her relaxing music on her ear buds. Assessed her 40 minute vitals, she did not speak and kept her eyes closed, 2:05 PM, 147/85, pulse 102. Explained she would be monitored for a total time of 120 minutes. Discharge vitals were taken at 3:35 PM 154/82, P 95. Dr. Jennelle Human came to visit with patient at end of her treatment. Dr. Jennelle Human is increasing her dose of Serqoquel to 600 mg at hs, and increasing Zoloft to 150 mg daily. New Rx's sent. Recommend she go home and sleep or just relax on the couch. No driving, no intense  activities. Pt. will be receiving 2 treatments per week for 4 weeks as recommended. Nurse was with pt a total of 60 minutes for clinical assessment. Pt is scheduled this Thursday, 15 th. Pt instructed to call office with any problems or questions prior to next treatment. Pt left with husband.       LOT [id number] EXP OCT 2026

## 2023-05-08 NOTE — Progress Notes (Signed)
Andrea [name] [id number] 1950-10-04 72 y.o.   Subjective:   Patient ID:  Andrea [name] is a 72 y.o. (DOB 1951-08-24) female.  Chief Complaint:  Chief Complaint  Patient presents with   Follow-up   Depression   Anxiety   Sleeping Problem    HPI Andrea [name] presents to the office today for follow-up of MDE and anxiety disorder.  seen 09/2019.  No meds were changed.  Been on Lexapro 20 since early 2017.  02/27/2020 phone call from patient: Pt experiencing more anxiety than she normally has. Pt would like to know what are some options that she has as far as her medication. Pt will be in an appt  MD response: If the anxiety is just occasional then using alprazolam as needed would be an okay thing to do.  If it is been fairly persistent for a couple of weeks or more than the simplest solution would be to increase the Lexapro to 1-1/2 tablets daily.  Historically she has done well on Lexapro 20 mg daily for an extended period of time.  If she is under temporary stress then once that is resolved we could drop it back to 1 daily.  If this does not work as expected then she should schedule an earlier appointment. Pt response: Patient called back and she's been having temporary stress, but it's not bad. Things are great, she's working out and feeling okay for the most part. It's been going on for a bit so she agrees to the increase Lexapro 20 mg 1.5 tablets daily. She has been taking alprazolam at hs occasionally to help sleep better and it does help.   12/06/2020 appointment with the following noted:  No covid.  Andrea [name] Covid and recovered.   Had increased Lexapro to 30 mg for 90 days and saw a difference but started seeing weight gain and was doing oK and able to drop back and been OK. Still renovating a house for a year.  Sold her house and mourns the house. Calmed down.  Kept 3 gkids and dogs 10 days.   Andrea [name] Hopes for new house May 1. Rare alprazolam for HS. Stress moving to condo and would have crying  spell over the move and some problems she was running into dealing with it.  Hard with change.  But taking time to adjust to the idea of moving from her house of 23 years.  Initially refused but he left it up to her and she's come to peace about it.  Andrea [name].   Good response to medication and no SE.  Exercising is good medicine.  If doesn't then doesn't feel as good. Satisfied. Plan: no med changes  08/01/21  appt noted:  seen with D Andrea [name] died August 17, 2023.  A lot of ups and downs per D.  Hard dips. Got worse when retired and dealing with the new  house.  Had trouble getting rid of things from the old house causing regrets over sellling the house.  D notes doesn't do well with instability  and stressors.  Ruminates on past mistakes. Blamed [name] for the  difficulty renovating the house.  Neg self talk and beats herself up. When not enough sleep will get depressed.  Does better if busy.   Dreads things.  Patient denies difficulty with sleep initiation or maintenance. Denies appetite disturbance.  Patient reports that energy and motivation have been good. Patient denies any difficulty with concentration.  Patient denies any suicidal ideation.  Plan: Abilify 2.5 mg daily for a week, then 5 mg daily. Continue Lexapro 20 mg daily  08/31/2021 appt noted: It worked immediately.  Insomnia with EMA. Normally 9 hours.  It's like I'm high.  Not tired.  Average 3-4 hours. Not depressed and no crazy negative thoughts.  Good response.  Energetic but up in middle of night. Plan: Okay to stop Abilify because of insomnia.   09/29/2021 appointment with the following noted: Parties were great.  Sleep problem resolved.  A lot of rest at the beach. Maybe the last week or 2 less motivation and socialization.  Not laying in the bed.  Just not as good as she was.  Not as outgoing as normal. No alcohol now.  Only had 1-2 at night.  Not ruminating.   Willing to restart Abilify at lower dose 2 mg daily. 3 grandsons   Plan: Relapse depression recurs she can resume Abilify  but lower dose 2 mg daily or every other day to try to avoid the insomnia where she can contact our office and we can discussed the possibility of an alternative antidepressant such as Trintellix or duloxetine.  12/28/2021 appointment noted: Several phone calls since she was here.  Abilify plus Lexapro failed.  Switch to duloxetine 90 mg which she started 12/02/2018 2023 Started lamotrigine. 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 SE tremor 90 mg duloxetine, ? Wt gain over a few weeks.. Exercise and wt control Social isolation, low self esteem, worry about the way she looks and not usually that way. Crying better with duloxetine.  More dependent on Andrea [name].   Anhedonia.  Black hole.  Never this low. No SI Having to use Xanax.   Asks about day treatment and TMS Plan: rec TMS Continue lamotrigine as prescribed Increase Rexulti to 1 mg daily Switch to Trintellix:  reduce duloxetine to 2 of the 30 mg capsules and start Trintellix 5 mg daily for 5 days,  Then Increase Trintellix to 10 mg daily and reduce duloxetine to 1 capsule for 1 week. Then stop duloxetine.  02/15/22 appt noted: On Trintellix , Rexulti 1 and lamotrigine I feel good.  Walks 3 miles daily is great for mental health. TMS would not be covered by [facility]. Started feeling better after a week or 2 after the last visit. SE appetite increased. No nausea. Not crying anymore. Sleeping well and no longer ruminates. Plan: Continue Trintellix but DC Rexulti due to weight gain.  Continue lamotrigine  03/06/2022 appointment with the following noted: Last 2 weeks has been very sad and nervous. Trintellix was increased to 20 mg daily  03/10/2022 complaining of ongoing depression, feeling jittery, negative thinking, early morning awakening, ruminating about past decisions.  Wanting to  consider TMS because she is desperate for improvement. Plan we will schedule urgent appointment  03/15/22 urgent appt noted:  seen with Andrea [name] Very depressed, worst ever.  Don't want to be alone, ongoing worry, ruminating on past decisions.  Racing negative thoughts.  No motivation.  Trouble staying asleep.  Getting 4 hours and used to 9 hours. Dread getting up. Using Xanax Increased Trintellix to 20 mg 9 days ago. Andrea [name] agrees sleep is critical. Isolating.   Need to be better by July 11. Plan: Clonazepam 1 mg HS. Continue trintellix 20 mg daily she is only been on this dose 9 days and it needs more time. Re lamotrigine and increase  to 150 mg daily.Leafy Kindle 1.5 mg every other day Option TMS  03/24/22 appt noted: So much better.  Thinking straighter and not negative and not sad.  Sleeping better 8-9 hours.. No crying. Andrea [name] notices progressively better every day.  Mind clarity is much better.   No SE noted.  No hangover.  No nausea. Found a therapist, Krystal Eaton.   Feels well enough to go to [place] and kind of excited.  Back July 16.   Plan: Clonazepam 1 mg HS. This resolved insomnia without SE Continue trintellix 20 mg daily she is only been on this dose 20 days and it needs more time. Re lamotriginecontinue 150 mg daily.Leafy Kindle 1.5 mg every other day Option TMS  04/12/22 appt noted: Great.  Went to [place].  Was herself and enjoyed it.  Anxiety was managed.   [facility] and liked her. 3# wt gain.   Patient reports stable mood and denies depressed or irritable moods.  Patient denies any recent difficulty with anxiety.  Patient denies difficulty with sleep initiation or maintenance. Denies appetite disturbance.  Patient reports that energy and motivation have been good.  Patient denies any difficulty with concentration.  Patient denies any suicidal ideation. Sleeping well than not anxious. Plan: Trintellix 20  07/10/22 TC depression returned  recently.  08/15/22 appt noted: Still blunted  and diminished motivation.  Some days better than others.  Going to gym.  Partially better.   Still has a lot of anxiety and doesn't want to be alone. Functioning but not normal.  Dreads parties.   SE tremor for a couple of week.sleep great.  Dep 7/10. Current Vraylar 1.5 mg daily, increase lamotrigine 200 mg daily, Trintellix 20 , clonazepam 0.5 mg HS Plan: Reduce Clonazepam 1/2  mg HS. This resolved insomnia without SE Continue trintellix 20 mg daily only mildly effective. lamotrigine continue 200 mg daily.Jerrilyn Cairo DT lost response Auvelity Stop Vraylar After Thanksgiving stop Trintellix Wait 3 days then [employer] 1 in the morning for 1 week,  and if no side effects then increase Auvelity to 1 in the AM and 1 in the PM  09/04/22 TC: Complaining of persistent depression and wanting to do something else to help.  Added lithium CR 300 mg nightly  09/11/2022 phone call complaining of no improvement with Auvelity and lithium.  Appointment was moved up.  09/13/22 appt noted: Nothing changed. No better and no worse.  Awakens sad and tearful.  Sleeps well. Pushing herself to the gym.  Has a trainer. On clonazepam 0.5 mg HS She remains anxious when alone for no apparent reason.  Feelings of inferiority.  She is normally extroverted but now she is wanting to isolate from people.  All tasks seem monumental.  No enjoyment and not looking forward to anything.  Everything is a Personal assistant.  She remains generally worried and anxious which is not typical for her. Plan: Clonazepam 1/2  mg HS. This resolved insomnia without SE Stop Auvelity Start nortriptyline 1 of the 25 mg capsules at night for 4 nights then 2 at night for 4 nights then 3 at night, then wait 1 week and get the blood test in the morning at [facility] Reduce lamotrigine to 1 and 1/2 tablets daily  Lamotrigine has not been helpful to him 100 mg daily.  Start reducing lamotrigine 150 mg  daily and will continue to taper at next appointment.  09/27/21 urgent appt :  she wanted urgent appt bc desperate to feel better.  Seen with Andrea [name] Called at least twice since here for the same reasons of depression. Not sleeping well even with clonazepam 1 mg HS. Doesn't want to be alone. Shakey.   On lithium 300 mg daily, nortriptyline 75 mg HS.   Doesn't want to live like this but not acutely suicidal. Tolerating meds. Plan: pursue [facility] protocol TMS at [facility] bc faster optin than alternatives  10/30/22 TC: finished 50 TMS tx in the week and wants appt ASAP bc still struggling with crying and depresssion..  11/03/22 urgent appt : Completed TMS last week. Tue-Thur better days and then Friday nose-dived. Not done well since got back.   Andrea [name] notes high anxiety and crying in AM and PM and doesn't want to be alone.  Xanxax seems to help. Xanax helps and taking 0.5 mg AM and 1 mg HS Andrea [name] notes memory is sharper and she's more alert after treatment. Very anxioius all the time.  Get up sad.  Exercising. Sleep good with Xanax 1 mg HS. Plan: Increase Lexapro 20 mg dailly (should help crying and maybe anxiety but unlikely to resolve depression) Increase alprazolam to 0.5 mg TID and 1 mg HS for severe anxiety. Andrea [name] notes it helps. Pramipexole 0.25 BID for 5 days and if NR then 0.5 mg Bid off label. Reduce lamotrigine 100 mg for 2 weeks then 50 mg daily for 2 weeks then stop it. She wants to pursue Spravato if pramipexole fails  11/17/22 emergency work in appt: Pramipexole was not sent in and MD not aware until yesterday.  She is not any better and is desperate for a med change. Sleeping well and taking Xanax 1 mg HS Mornings are worse.  Not crying much.  Last Friday was a bad day and she didn't want to be alone.  But did let her Andrea [name] play golf once this week. Reduced lamotrigine today to 50 BID and weaning off. Andrea [name] says better this week than last week. Increased Lexapro to 20 mg daily. Plan: Increased Lexapro 20  mg dailly (helped crying and maybe anxiety but unlikely to resolve depression) Continue alprazolam to 0.5 mg TID prn anxiety and 1 mg HS. Andrea [name] notes it helps. Pramipexole 0.25 mg BID for 3 days then 0.5 mg BID Reduce lamotrigine 50 mg daily for 2 weeks then stop it. She wants to pursue Spravato if pramipexole fails  12/25/22 appt urgently. COMPLETED 1 WEEK TMS [facility] without benefit except TMS took away brain fog.   She feels need to take Xanax 0.5 mg TID  Some sleepiness with Xanax . Mornings are better and fine while at the gym but when home gets depressed again.   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 Needs Xanax to do social things. Plan: Increased Lexapro 20 mg dailly (helped crying and maybe anxiety but unlikely to resolve depression) Continue alprazolam to 0.5 mg TID prn anxiety and 1 mg HS. Andrea [name] notes it helps. Change pramipexole 0.5mg  tablets, 1 tablet four times daily for 1 week,  Then if not better 1and 1/2 tablets in the AM and dinner and 1 tablet at lunch and dinner.    4/9-4/15 hosp for ruptured appendix and missed meds: Can restart Lexapro at 1/2 tablet daily for 3 days then 1 daily. Restart 1/2 pramipexole tablet 3 times daily for 3 days then 1 tablet 3 times daily for 3 days then 1 and 1/2 tablet 3 times daily.      01/12/23 appt noted: No  GI sx now.  No appetite but working on protein.   I think I'm doing real well mentally.  Has resumed meds.  Mood has been great. Pramipexole 0.5mg   1 and 1/2 TID Lexapro 20 Xanax reduced to 1 mg HS Andrea [name] sees a difference.   No SE with meds.   No med changes  01/25/23 appt noted: Meds:  pramipexole 0.75 mg TID, Xanax 1.5 mg HS and 0.5 mg prn, Lexapro 20 mg daily. Doing great.  Staples removed and healing from surgery appendectomy. Back in the gym helps her.   Satisfied with meds. Getting out socially and feeling like her old self.  Andrea's Andrea [name] Brad went to ER vomiting blood.   Plan: no med changes  02/07/23 appt noted: Back from [facility]  yesterday.  Had ear infection.  Then conjunctivitis.  Eyes hurt.   Before the surgery was getting better and feels like she is sliding back some.  Gradually a little worse since here.  Wonders if she could get better with joy and social drive.   Off Abx .  Sleep is good with meds.  With depression then gets anxious too.  Is functioning.   Meds:  pramipexole 0.75 mg TID, Xanax 1.5 mg HS and 0.5 mg prn, Lexapro 20 mg daily. Consistent and no SE. Plan: Continue Lexapro 20 mg dailly (helped crying and maybe anxiety but unlikely to resolve depression) Continue alprazolam  1 mg HS. Andrea [name] notes it helps. Increase pramipexole 0.5mg  tablets, 2 tablets 3 times daily  01/31/23 TC:Franchot Erichsen, CMA  to Me     02/20/23  4:56 PM Note Patient reporting she is not feeling as good as she thinks she should. Her anxiety is the biggest concern. She said she can go to the gym in the mornings and does ok, but she had a closing to go to recently and had to take 1/4 of a Xanax, reports it took the edge off. She reports daily tremors that are helped by Xanax. She is not crying, she is getting things done, sleeping okay. Will have her grandchildren next week and she isn't excited about it like she should be.    Taking 6 mirapex, 1.5 Xanax and Lexapro 20 mg.     MD resp:  CC   02/20/23  5:48 PM Note We just increased the pramipexole recently and I see her next week.  I do not want to increase the medicine any further until I see her again.      03/02/23 urgent appt noted: Some improvement with incr pramipexole 1 mg TID. Before felt worse in AM now feels better in AM and goes to gym. Otherwise no excitement.  Hard to make decisions. Less social and smiling.  Subdued more than normal.  Not confident.  Not enjoying present and ruminates in past.   No SE.  No impulsivity Has tremor for awhile .  Is mild for a month. Hard to conc.  Brain fog was better with TMS but is back. Continue Lexapro 20 mg dailly (helped crying  and maybe anxiety but unlikely to resolve depression) Continue alprazolam  1 mg HS. Andrea [name] notes it helps. Increase pramipexole 1 mg QID times daily  03/16/23 appt noted: Meds as above SE: If not enough food makes her excitable, urgent, talking fast.  Does it a bit too much.  Energy fine.   Feels better in the AM at the gym and when home a dark shadow comes over her.   Still feels dep overall.  Went to work with friends and didn't feel natural and didn't enjoy it like she should.  Pushes herself to stay active.   Takes 1.5 mg Xanax HS and then awakens and takes another 0.5 mg HS but gets enough sleep. A lot of brain fog.  Feels inferior and doesn't usually feel that way. Infrequent crying spells. Plan:  Continue Lexapro 20 mg dailly (helped crying and maybe anxiety but unlikely to resolve depression) Continue alprazolam  1 mg HS. Andrea [name] notes it helps. Reduce pramipexole 1 mg TID for 3 days, then 0.5 mg QID for 3 days, then 0.5 mg BID for 3 days, then 0.25 mg BID for 3 days, then stop it. Now start Latuda (lurasidone) 40 mg 1/2 tablet for 1 week.  If not benefit then 1 tablet daily with 350 calories  04/13/23 urgent appt note: with [facility] 04/05/23 for dep with SI "Worst I've ever been".  Feels out of control.  Shaking .  Can't sleep.  Feels disconnected.   Hated being hosp bc locked up.   Taking quetiapine 100 hS and lorazepam and still can't sleep. Started sertraline 50 and stopped Lexapro. Quetiapine 100 mg HS Plan: Stop hydroxyzine Increase quetiapine 200 then 300 mg at night for sleep and depression  Stop lorazepam and return to alprazolam 1 mg at night for sleep Increase sertraline to 100 mg daily or 2 of 50 mg nightly  04/23/23 appt noted: Here for first appt with Spravato.  Highly anxious about getting Spravato today.  Doesn't like feeling out of control and worries over this. Still very depressed.  Doesn't think her sx as noted are due to depression.  Thinks she has a neuro problem.  But she  has no other neuro sx like numbness, weakness, balance px, or anything other than dep and anxiety sx. Received Spravato 56 mg today.  "It was horrible!" Bc she felt out of control and was anxious receiving it.  Thought it would last forever re: dissociation.  It was scary to her.  Couldn't relax for it.  No N, V, HA.  Ambivalent about continuing Spravato but open. No specific concerns with meds.    04/25/23 appt noted: Needed Xanax before visit today to overcome fear of coming to the appt. She was more disciplined about controlling neg thought during Spravato admin and had a much better experience.  It was not scary nor associated with sig fear.  She is still dep and anxious without change otherwise since seen a couple of days ago. Tolerating med changes from last week.   Received Spravato 56 mg again and tolerated it without adverse SE.  Typical SE dissociation, no NV, palpitations, HA.  Motivated to continue the Spravato and agrees to increase it.  Better experience than the first time. Plan: Increase quetiapine to 400 mg HS  04/26/23 TC:  Patient taking 300 mg of Seroquel and 1 mg of Xanax for sleep. She can get to sleep, just can't stay asleep. Doesn't say how long she is sleeping, but says she needs 9 hours of sleep. Reporting social isolation is bad, she won't leave the house, but doesn't like being home alone.  Reports she has never been this sick mentally and feels like she may need to go to the hospital. No SI, just doesn't like being alone.     MD resp:  Pt just seen yesterday and received 2nd Spravato 56 mg .  That was positive experience. However she is still severely depressed and ruminating and reassurance  seeking..  She doesn't need to go to the hospital. She told me yesterday she slept 7 hours but needs 9 hours.  7 hours is not bad.   She is taking quetiapne 300 mg HS with Xanax 1 mg HS.  No increase in Xanax but can increase quetiapine to 400 mg HS (max dose 800) and this could help  sleep and anxiety and depression. She should keep appt next week for next Spravato.  In case she asks, Spravato is not making her worse in any way.     05/01/23 appt noted;Andrea [name] present also Current meds; quetiapine 400 mg HS, sertraline 100 mg daily, alprazolam 0.5 mg TID and 1 mg HS. Tolerating med changes.    Received Spravato 84 mg first time.  Typical SE dissociation, no NV, palpitations, HA.  However she had a bad experience DT severe anxiety before starting the Spravato.   Very fearful, "scared".  Without specific reasons.  Severe anxiety dep, hopeless.  No SI but doesn't want to live like this.  So anxious hard to sleep and some crying spells. Seroquel not helping sleep.  05/03/23 appt noted: Current meds; quetiapine 400 mg HS, sertraline 100 mg daily, alprazolam 0.5 mg TID and 1 mg HS. Tolerating med changes.    Received Spravato 84 mg.  Typical SE dissociation, no NV, palpitations, HA.  However she had a bad experience DT severe anxiety before starting the Spravato.   Highly anxious, fearful of "everything" including Spravato admin.  Afraid she will forget who she is despite having it before.  Still anxious and trouble sleeping at home.  Sleep no better with quetiapine and still ruminatiing.   Plan increase quetiapine to 500 mg HS  05/08/23 appt noted: Meds: quetiapine 500 mg HS, sertraline 100 mg daily, alprazolam 0.5 mg TID and 1 mg HS. Received Spravato 84 mg 3rd time.  Typical SE dissociation, no NV, palpitations, HA.  However she had a bad experience DT severe anxiety before starting the Spravato.   She is not better so far.  Ongoing severe depression anxiety, avoidance, anhedonia, rumination, afraid to be alone. All not typical of herself.   No SE with meds except constipation and sweating at night.     ECT-MADRS    Flowsheet Row Office Visit from 04/13/2023 in [facility] Office Visit from 11/03/2022 in [facility]  MADRS  Total Score 45 40      GAD-7    Flowsheet Row Office Visit from 05/02/2023 in [facility]  Total GAD-7 Score 21      PHQ2-9    Flowsheet Row Office Visit from 05/02/2023 in [facility] Clinical Support from 02/24/2022 in [facility] Video Visit from 06/14/2021 in [facility] [place]  PHQ-2 Total Score 6 0 0  PHQ-9 Total Score 24 -- 0      Flowsheet Row ED from 01/31/2023 in [facility]  ED to Hosp-Admission (Discharged) from 01/02/2023 [facility] ED from 12/03/2022 in [facility]   C-SSRS RISK CATEGORY No Risk No Risk No Risk      Past Psychiatric Medication Trials: Sertraline, fluoxetine, citalopram 50, nefazodone,  Lexapro 30 weight gain,  Viibryd 30, Wellbutrin 300 , Duloxetine 90 SE tremor, mirtazapine,  Trintellix 20 little response Auvelity twice daily for 3 weeks no response Nortriptyline  75 level 86 NR Pramipexole 1 mg QID  poor resp and felt hyped  TMS  Lamotrigine 200 no response Abilify, Rexulti 0.5 Vraylar 1.5 daily tremor and lost response Lithium 300 shakes  methyl folate buspirone,  alprazolam, clonazepam, Under the care of [facility] since January 2001  Andrea [name] does well on lamotrigine.  Review of Systems:  Review of Systems  Constitutional:  Positive for diaphoresis. Negative for appetite change.  Cardiovascular:  Negative for palpitations.  Neurological:  Positive for light-headedness. Negative for weakness.  Psychiatric/Behavioral:  Positive for decreased concentration, dysphoric mood and sleep disturbance. Negative for behavioral problems, confusion, hallucinations, self-injury and suicidal ideas. The patient is nervous/anxious. The patient is not hyperactive.     Medications: I have reviewed the patient's current  medications.  Current Outpatient Medications  Medication Sig Dispense Refill   ALPRAZolam (XANAX) 1 MG tablet 1/2 tablet three times daily and 1 tablet at night 75 tablet 0   atorvastatin (LIPITOR) 20 MG tablet Take 20 mg by mouth.      calcium citrate-vitamin D (CITRACAL+D) 315-200 MG-UNIT tablet Take 1 tablet by mouth daily.     Esketamine HCl, 84 MG Dose, (SPRAVATO, 84 MG DOSE,) 28 MG/DEVICE SOPK Place 84 mg into the nose every 3 (three) days.     fluticasone (FLONASE) 50 MCG/ACT nasal spray fluticasone propionate 50 mcg/actuation nasal spray,suspension     minoxidil (LONITEN) 2.5 MG tablet Take 2.5 mg by mouth daily.     QUEtiapine (SEROQUEL) 300 MG tablet Take 2 tablets (600 mg total) by mouth at bedtime. (Patient taking differently: Take 500 mg by mouth at bedtime.) 60 tablet 1   raloxifene (EVISTA) 60 MG tablet Take 60 mg by mouth daily.     sertraline (ZOLOFT) 100 MG tablet Take 1.5 tablets (150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

## 2023-05-09 ENCOUNTER — Other Ambulatory Visit: Payer: Self-pay

## 2023-05-09 DIAGNOSIS — F5105 Insomnia due to other mental disorder: Secondary | ICD-10-CM

## 2023-05-09 DIAGNOSIS — F339 Major depressive disorder, recurrent, unspecified: Secondary | ICD-10-CM

## 2023-05-09 MED ORDER — QUETIAPINE FUMARATE 300 MG PO TABS: 600.0000 mg | ORAL_TABLET | Freq: Every day | ORAL | 2 refills | Status: DC

## 2023-05-10 ENCOUNTER — Encounter: Payer: Self-pay | Admitting: Psychiatry

## 2023-05-10 ENCOUNTER — Ambulatory Visit (INDEPENDENT_AMBULATORY_CARE_PROVIDER_SITE_OTHER): Payer: Medicare Other | Admitting: Psychiatry

## 2023-05-10 ENCOUNTER — Ambulatory Visit: Payer: Medicare Other

## 2023-05-10 VITALS — BP 136/85 | HR 93

## 2023-05-10 DIAGNOSIS — F5105 Insomnia due to other mental disorder: Secondary | ICD-10-CM

## 2023-05-10 DIAGNOSIS — F339 Major depressive disorder, recurrent, unspecified: Secondary | ICD-10-CM

## 2023-05-10 DIAGNOSIS — F411 Generalized anxiety disorder: Secondary | ICD-10-CM

## 2023-05-10 MED ORDER — OLANZAPINE 10 MG PO TABS
10.0000 mg | ORAL_TABLET | Freq: Every day | ORAL | 0 refills | Status: DC
Start: 2023-05-10 — End: 2023-05-17

## 2023-05-10 NOTE — Progress Notes (Signed)
Andrea [name] [id number] Feb 18, 1951 72 y.o.   Subjective:   Patient ID:  Andrea [name] is a 72 y.o. (DOB 10-Sep-1951) female.  Chief Complaint:  Chief Complaint  Patient presents with   Follow-up   Depression   Anxiety   Sleeping Problem    HPI Andrea [name] presents to the office today for follow-up of MDE and anxiety disorder.  seen 09/2019.  No meds were changed.  Been on Lexapro 20 since early 2017.  02/27/2020 phone call from patient: Pt experiencing more anxiety than she normally has. Pt would like to know what are some options that she has as far as her medication. Pt will be in an appt  MD response: If the anxiety is just occasional then using alprazolam as needed would be an okay thing to do.  If it is been fairly persistent for a couple of weeks or more than the simplest solution would be to increase the Lexapro to 1-1/2 tablets daily.  Historically she has done well on Lexapro 20 mg daily for an extended period of time.  If she is under temporary stress then once that is resolved we could drop it back to 1 daily.  If this does not work as expected then she should schedule an earlier appointment. Pt response: Patient called back and she's been having temporary stress, but it's not bad. Things are great, she's working out and feeling okay for the most part. It's been going on for a bit so she agrees to the increase Lexapro 20 mg 1.5 tablets daily. She has been taking alprazolam at hs occasionally to help sleep better and it does help.   12/06/2020 appointment with the following noted:  No covid.  H Covid and recovered.   Had increased Lexapro to 30 mg for 90 days and saw a difference but started seeing weight gain and was doing oK and able to drop back and been OK. Still renovating a house for a year.  Sold her house and mourns the house. Calmed down.  Kept 3 gkids and dogs 10 days.   Andrea [name] Hopes for new house May 1. Rare alprazolam for HS. Stress moving to condo and would have crying  spell over the move and some problems she was running into dealing with it.  Hard with change.  But taking time to adjust to the idea of moving from her house of 23 years.  Initially refused but he left it up to her and she's come to peace about it.  Andrea [name].   Good response to medication and no SE.  Exercising is good medicine.  If doesn't then doesn't feel as good. Satisfied. Plan: no med changes  08/01/21  appt noted:  seen with [name] died 2023/08/20.  A lot of ups and downs per D.  Hard dips. Got worse when retired and dealing with the new  house.  Had trouble getting rid of things from the old house causing regrets over sellling the house.  D notes doesn't do well with instability  and stressors.  Ruminates on past mistakes. Blamed [name] for the  difficulty renovating the house.  Neg self talk and beats herself up. When not enough sleep will get depressed.  Does better if busy.   Dreads things.  Patient denies difficulty with sleep initiation or maintenance. Denies appetite disturbance.  Patient reports that energy and motivation have been good. Patient denies any difficulty with concentration.  Patient denies any suicidal ideation.  Plan: Abilify 2.5 mg daily for a week, then 5 mg daily. Continue Lexapro 20 mg daily  08/31/2021 appt noted: It worked immediately.  Insomnia with EMA. Normally 9 hours.  It's like I'm high.  Not tired.  Average 3-4 hours. Not depressed and no crazy negative thoughts.  Good response.  Energetic but up in middle of night. Plan: Okay to stop Abilify because of insomnia.   09/29/2021 appointment with the following noted: Parties were great.  Sleep problem resolved.  A lot of rest at the beach. Maybe the last week or 2 less motivation and socialization.  Not laying in the bed.  Just not as good as she was.  Not as outgoing as normal. No alcohol now.  Only had 1-2 at night.  Not ruminating.   Willing to restart Abilify at lower dose 2 mg daily. 3 grandsons   Plan: Relapse depression recurs she can resume Abilify  but lower dose 2 mg daily or every other day to try to avoid the insomnia where she can contact our office and we can discussed the possibility of an alternative antidepressant such as Trintellix or duloxetine.  12/28/2021 appointment noted: Several phone calls since she was here.  Abilify plus Lexapro failed.  Switch to duloxetine 90 mg which she started 12/02/2018 2023 Started lamotrigine. Also started Rexulti Sister has had cycling depression that has responded well to lamotrigine with poor response to SSRIs [name] with a little dementia. SE tremor 90 mg duloxetine, ? Wt gain over a few weeks.. Exercise and wt control Social isolation, low self esteem, worry about the way she looks and not usually that way. Crying better with duloxetine.  More dependent on H.   Anhedonia.  Black hole.  Never this low. No SI Having to use Xanax.   Asks about day treatment and TMS Plan: rec TMS Continue lamotrigine as prescribed Increase Rexulti to 1 mg daily Switch to Trintellix:  reduce duloxetine to 2 of the 30 mg capsules and start Trintellix 5 mg daily for 5 days,  Then Increase Trintellix to 10 mg daily and reduce duloxetine to 1 capsule for 1 week. Then stop duloxetine.  02/15/22 appt noted: On Trintellix , Rexulti 1 and lamotrigine I feel good.  Walks 3 miles daily is great for mental health. TMS would not be covered by [facility]. Started feeling better after a week or 2 after the last visit. SE appetite increased. No nausea. Not crying anymore. Sleeping well and no longer ruminates. Plan: Continue Trintellix but DC Rexulti due to weight gain.  Continue lamotrigine  03/06/2022 appointment with the following noted: Last 2 weeks has been very sad and nervous. Trintellix was increased to 20 mg daily  03/10/2022 complaining of ongoing depression, feeling jittery, negative thinking, early morning awakening, ruminating about past decisions.  Wanting to  consider TMS because she is desperate for improvement. Plan we will schedule urgent appointment  03/15/22 urgent appt noted:  seen with Andrea [name] Very depressed, worst ever.  Don't want to be alone, ongoing worry, ruminating on past decisions.  Racing negative thoughts.  No motivation.  Trouble staying asleep.  Getting 4 hours and used to 9 hours. Dread getting up. Using Xanax Increased Trintellix to 20 mg 9 days ago. H agrees sleep is critical. Isolating.   Need to be better by July 11. Plan: Clonazepam 1 mg HS. Continue trintellix 20 mg daily she is only been on this dose 9 days and it needs more time. Re lamotrigine and increase  to 150 mg daily.Leafy Kindle 1.5 mg every other day Option TMS  03/24/22 appt noted: So much better.  Thinking straighter and not negative and not sad.  Sleeping better 8-9 hours.. No crying. H notices progressively better every day.  Mind clarity is much better.   No SE noted.  No hangover.  No nausea. Found a therapist, Krystal Eaton.   Feels well enough to go to [place] and kind of excited.  Back July 16.   Plan: Clonazepam 1 mg HS. This resolved insomnia without SE Continue trintellix 20 mg daily she is only been on this dose 20 days and it needs more time. Re lamotriginecontinue 150 mg daily.Leafy Kindle 1.5 mg every other day Option TMS  04/12/22 appt noted: Great.  Went to [place].  Was herself and enjoyed it.  Anxiety was managed.   [facility] and liked her. 3# wt gain.   Patient reports stable mood and denies depressed or irritable moods.  Patient denies any recent difficulty with anxiety.  Patient denies difficulty with sleep initiation or maintenance. Denies appetite disturbance.  Patient reports that energy and motivation have been good.  Patient denies any difficulty with concentration.  Patient denies any suicidal ideation. Sleeping well than not anxious. Plan: Trintellix 20  07/10/22 TC depression returned  recently.  08/15/22 appt noted: Still blunted  and diminished motivation.  Some days better than others.  Going to gym.  Partially better.   Still has a lot of anxiety and doesn't want to be alone. Functioning but not normal.  Dreads parties.   SE tremor for a couple of week.sleep great.  Dep 7/10. Current Vraylar 1.5 mg daily, increase lamotrigine 200 mg daily, Trintellix 20 , clonazepam 0.5 mg HS Plan: Reduce Clonazepam 1/2  mg HS. This resolved insomnia without SE Continue trintellix 20 mg daily only mildly effective. lamotrigine continue 200 mg daily.Jerrilyn Cairo DT lost response Auvelity Stop Vraylar After Thanksgiving stop Trintellix Wait 3 days then [employer] 1 in the morning for 1 week,  and if no side effects then increase Auvelity to 1 in the AM and 1 in the PM  09/04/22 TC: Complaining of persistent depression and wanting to do something else to help.  Added lithium CR 300 mg nightly  09/11/2022 phone call complaining of no improvement with Auvelity and lithium.  Appointment was moved up.  09/13/22 appt noted: Nothing changed. No better and no worse.  Awakens sad and tearful.  Sleeps well. Pushing herself to the gym.  Has a trainer. On clonazepam 0.5 mg HS She remains anxious when alone for no apparent reason.  Feelings of inferiority.  She is normally extroverted but now she is wanting to isolate from people.  All tasks seem monumental.  No enjoyment and not looking forward to anything.  Everything is a Personal assistant.  She remains generally worried and anxious which is not typical for her. Plan: Clonazepam 1/2  mg HS. This resolved insomnia without SE Stop Auvelity Start nortriptyline 1 of the 25 mg capsules at night for 4 nights then 2 at night for 4 nights then 3 at night, then wait 1 week and get the blood test in the morning at [facility] Reduce lamotrigine to 1 and 1/2 tablets daily  Lamotrigine has not been helpful to him 100 mg daily.  Start reducing lamotrigine 150 mg  daily and will continue to taper at next appointment.  09/27/21 urgent appt :  she wanted urgent appt bc desperate to feel better.  Seen with H Called at least twice since here for the same reasons of depression. Not sleeping well even with clonazepam 1 mg HS. Doesn't want to be alone. Shakey.   On lithium 300 mg daily, nortriptyline 75 mg HS.   Doesn't want to live like this but not acutely suicidal. Tolerating meds. Plan: pursue [place] protocol [facility]MS [facility] bc faster optin than alternatives  10/30/22 TC: finished 50 TMS tx in the week and wants appt ASAP bc still struggling with crying and depresssion..  11/03/22 urgent appt : Completed TMS last week. Tue-Thur better days and then Friday nose-dived. Not done well since got back.   H notes high anxiety and crying in AM and PM and doesn't want to be alone.  Xanxax seems to help. Xanax helps and taking 0.5 mg AM and 1 mg HS H notes memory is sharper and she's more alert after treatment. Very anxioius all the time.  Get up sad.  Exercising. Sleep good with Xanax 1 mg HS. Plan: Increase Lexapro 20 mg dailly (should help crying and maybe anxiety but unlikely to resolve depression) Increase alprazolam to 0.5 mg TID and 1 mg HS for severe anxiety. H notes it helps. Pramipexole 0.25 BID for 5 days and if NR then 0.5 mg Bid off label. Reduce lamotrigine 100 mg for 2 weeks then 50 mg daily for 2 weeks then stop it. She wants to pursue Spravato if pramipexole fails  11/17/22 emergency work in appt: Pramipexole was not sent in and MD not aware until yesterday.  She is not any better and is desperate for a med change. Sleeping well and taking Xanax 1 mg HS Mornings are worse.  Not crying much.  Last Friday was a bad day and she didn't want to be alone.  But did let her H play golf once this week. Reduced lamotrigine today to 50 BID and weaning off. H says better this week than last week. Increased Lexapro to 20 mg daily. Plan: Increased Lexapro 20  mg dailly (helped crying and maybe anxiety but unlikely to resolve depression) Continue alprazolam to 0.5 mg TID prn anxiety and 1 mg HS. H notes it helps. Pramipexole 0.25 mg BID for 3 days then 0.5 mg BID Reduce lamotrigine 50 mg daily for 2 weeks then stop it. She wants to pursue Spravato if pramipexole fails  12/25/22 appt urgently. COMPLETED 1 WEEK TMS [facility] without benefit except TMS took away brain fog.   She feels need to take Xanax 0.5 mg TID  Some sleepiness with Xanax . Mornings are better and fine while at the gym but when home gets depressed again.   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 Needs Xanax to do social things. Plan: Increased Lexapro 20 mg dailly (helped crying and maybe anxiety but unlikely to resolve depression) Continue alprazolam to 0.5 mg TID prn anxiety and 1 mg HS. H notes it helps. Change pramipexole 0.5mg  tablets, 1 tablet four times daily for 1 week,  Then if not better 1and 1/2 tablets in the AM and dinner and 1 tablet at lunch and dinner.    4/9-4/15 hosp for ruptured appendix and missed meds: Can restart Lexapro at 1/2 tablet daily for 3 days then 1 daily. Restart 1/2 pramipexole tablet 3 times daily for 3 days then 1 tablet 3 times daily for 3 days then 1 and 1/2 tablet 3 times daily.      01/12/23 appt noted: No  GI sx now.  No appetite but working on protein.   I think I'm doing real well mentally.  Has resumed meds.  Mood has been great. Pramipexole 0.5mg   1 and 1/2 TID Lexapro 20 Xanax reduced to 1 mg HS H sees a difference.   No SE with meds.   No med changes  01/25/23 appt noted: Meds:  pramipexole 0.75 mg TID, Xanax 1.5 mg HS and 0.5 mg prn, Lexapro 20 mg daily. Doing great.  Staples removed and healing from surgery appendectomy. Back in the gym helps her.   Satisfied with meds. Getting out socially and feeling like her old self.  Chelsea's H Brad went to ER vomiting blood.   Plan: no med changes  02/07/23 appt noted: Back from [facility]  yesterday.  Had ear infection.  Then conjunctivitis.  Eyes hurt.   Before the surgery was getting better and feels like she is sliding back some.  Gradually a little worse since here.  Wonders if she could get better with joy and social drive.   Off Abx .  Sleep is good with meds.  With depression then gets anxious too.  Is functioning.   Meds:  pramipexole 0.75 mg TID, Xanax 1.5 mg HS and 0.5 mg prn, Lexapro 20 mg daily. Consistent and no SE. Plan: Continue Lexapro 20 mg dailly (helped crying and maybe anxiety but unlikely to resolve depression) Continue alprazolam  1 mg HS. H notes it helps. Increase pramipexole 0.5mg  tablets, 2 tablets 3 times daily  01/31/23 TC:Franchot Erichsen, CMA  to Me     02/20/23  4:56 PM Note Patient reporting she is not feeling as good as she thinks she should. Her anxiety is the biggest concern. She said she can go to the gym in the mornings and does ok, but she had a closing to go to recently and had to take 1/4 of a Xanax, reports it took the edge off. She reports daily tremors that are helped by Xanax. She is not crying, she is getting things done, sleeping okay. Will have her grandchildren next week and she isn't excited about it like she should be.    Taking 6 mirapex, 1.5 Xanax and Lexapro 20 mg.     MD resp:  CC   02/20/23  5:48 PM Note We just increased the pramipexole recently and I see her next week.  I do not want to increase the medicine any further until I see her again.      03/02/23 urgent appt noted: Some improvement with incr pramipexole 1 mg TID. Before felt worse in AM now feels better in AM and goes to gym. Otherwise no excitement.  Hard to make decisions. Less social and smiling.  Subdued more than normal.  Not confident.  Not enjoying present and ruminates in past.   No SE.  No impulsivity Has tremor for awhile .  Is mild for a month. Hard to conc.  Brain fog was better with TMS but is back. Continue Lexapro 20 mg dailly (helped crying  and maybe anxiety but unlikely to resolve depression) Continue alprazolam  1 mg HS. H notes it helps. Increase pramipexole 1 mg QID times daily  03/16/23 appt noted: Meds as above SE: If not enough food makes her excitable, urgent, talking fast.  Does it a bit too much.  Energy fine.   Feels better in the AM at the gym and when home a dark shadow comes over her.   Still feels dep overall.  Went to work with friends and didn't feel natural and didn't enjoy it like she should.  Pushes herself to stay active.   Takes 1.5 mg Xanax HS and then awakens and takes another 0.5 mg HS but gets enough sleep. A lot of brain fog.  Feels inferior and doesn't usually feel that way. Infrequent crying spells. Plan:  Continue Lexapro 20 mg dailly (helped crying and maybe anxiety but unlikely to resolve depression) Continue alprazolam  1 mg HS. H notes it helps. Reduce pramipexole 1 mg TID for 3 days, then 0.5 mg QID for 3 days, then 0.5 mg BID for 3 days, then 0.25 mg BID for 3 days, then stop it. Now start Latuda (lurasidone) 40 mg 1/2 tablet for 1 week.  If not benefit then 1 tablet daily with 350 calories  04/13/23 urgent appt note: with [facility] 04/05/23 for dep with SI "Worst I've ever been".  Feels out of control.  Shaking .  Can't sleep.  Feels disconnected.   Hated being hosp bc locked up.   Taking quetiapine 100 hS and lorazepam and still can't sleep. Started sertraline 50 and stopped Lexapro. Quetiapine 100 mg HS Plan: Stop hydroxyzine Increase quetiapine 200 then 300 mg at night for sleep and depression  Stop lorazepam and return to alprazolam 1 mg at night for sleep Increase sertraline to 100 mg daily or 2 of 50 mg nightly  04/23/23 appt noted: Here for first appt with Spravato.  Highly anxious about getting Spravato today.  Doesn't like feeling out of control and worries over this. Still very depressed.  Doesn't think her sx as noted are due to depression.  Thinks she has a neuro problem.  But she  has no other neuro sx like numbness, weakness, balance px, or anything other than dep and anxiety sx. Received Spravato 56 mg today.  "It was horrible!" Bc she felt out of control and was anxious receiving it.  Thought it would last forever re: dissociation.  It was scary to her.  Couldn't relax for it.  No N, V, HA.  Ambivalent about continuing Spravato but open. No specific concerns with meds.    04/25/23 appt noted: Needed Xanax before visit today to overcome fear of coming to the appt. She was more disciplined about controlling neg thought during Spravato admin and had a much better experience.  It was not scary nor associated with sig fear.  She is still dep and anxious without change otherwise since seen a couple of days ago. Tolerating med changes from last week.   Received Spravato 56 mg again and tolerated it without adverse SE.  Typical SE dissociation, no NV, palpitations, HA.  Motivated to continue the Spravato and agrees to increase it.  Better experience than the first time. Plan: Increase quetiapine to 400 mg HS  04/26/23 TC:  Patient taking 300 mg of Seroquel and 1 mg of Xanax for sleep. She can get to sleep, just can't stay asleep. Doesn't say how long she is sleeping, but says she needs 9 hours of sleep. Reporting social isolation is bad, she won't leave the house, but doesn't like being home alone.  Reports she has never been this sick mentally and feels like she may need to go to the hospital. No SI, just doesn't like being alone.     MD resp:  Pt just seen yesterday and received 2nd Spravato 56 mg .  That was positive experience. However she is still severely depressed and ruminating and reassurance  seeking..  She doesn't need to go to the hospital. She told me yesterday she slept 7 hours but needs 9 hours.  7 hours is not bad.   She is taking quetiapne 300 mg HS with Xanax 1 mg HS.  No increase in Xanax but can increase quetiapine to 400 mg HS (max dose 800) and this could help  sleep and anxiety and depression. She should keep appt next week for next Spravato.  In case she asks, Spravato is not making her worse in any way.     05/01/23 appt noted;H present also Current meds; quetiapine 400 mg HS, sertraline 100 mg daily, alprazolam 0.5 mg TID and 1 mg HS. Tolerating med changes.    Received Spravato 84 mg first time.  Typical SE dissociation, no NV, palpitations, HA.  However she had a bad experience DT severe anxiety before starting the Spravato.   Very fearful, "scared".  Without specific reasons.  Severe anxiety dep, hopeless.  No SI but doesn't want to live like this.  So anxious hard to sleep and some crying spells. Seroquel not helping sleep.  05/03/23 appt noted: Current meds; quetiapine 400 mg HS, sertraline 100 mg daily, alprazolam 0.5 mg TID and 1 mg HS. Tolerating med changes.    Received Spravato 84 mg.  Typical SE dissociation, no NV, palpitations, HA.  However she had a bad experience DT severe anxiety before starting the Spravato.   Highly anxious, fearful of "everything" including Spravato admin.  Afraid she will forget who she is despite having it before.  Still anxious and trouble sleeping at home.  Sleep no better with quetiapine and still ruminatiing.   Plan increase quetiapine to 500 mg HS  05/08/23 appt noted: Meds: quetiapine 500 mg HS, sertraline 100 mg daily, alprazolam 0.5 mg TID and 1 mg HS. Received Spravato 84 mg 3rd time.  Typical SE dissociation, no NV, palpitations, HA.  However she had a bad experience DT severe anxiety before starting the Spravato.   She is not better so far.  Ongoing severe depression anxiety, avoidance, anhedonia, rumination, afraid to be alone. All not typical of herself.   No SE with meds except constipation and sweating at night.  Plan:  Increase quetiapine to 600 mg at night for sleep and depression & rumination and next week to 600 HS increased alprazolam 1 mg at night for sleep and 0.5 mg TID Increase sertraline  150 mg daily  Spravato 84 mg AM twice weekly for severe TRD  05/10/23 appt noted:  Meds: quetiapine 600 mg HS, sertraline 150 mg daily, alprazolam 0.5 mg TID and 1 mg HS. Received Spravato 84 mg 4th time.  Typical SE dissociation, no NV, palpitations, HA.  However she had a bad experience DT severe anxiety before starting the Spravato.   She is not better so far.  Ongoing severe depression anxiety, avoidance, anhedonia, rumination, afraid to be alone. All not typical of herself.   SE constipation she finds unmanageable  Did sleep better with increased quetiapine 600 mg Hs but cannot continue dT constipation.  However dep and anxiety are no better with Spravato or the other treatments except better sleep.  Anxiety ongoing severe with rumination.    ECT-MADRS    Flowsheet Row Office Visit from 04/13/2023 in [facility] Office Visit from 11/03/2022 in [facility]  MADRS Total Score 45 40      GAD-7    Flowsheet Row Office Visit from 05/02/2023 in [facility] conservation officer at [facility]  Total GAD-7 Score 21      PHQ2-9    Flowsheet Row Office Visit from 05/02/2023 in [facility] Clinical Support from 02/24/2022 in [facility] Video Visit from 06/14/2021 in [facility] [place]  PHQ-2 Total Score 6 0 0  PHQ-9 Total Score 24 -- 0      Flowsheet Row ED from 01/31/2023 in [facility]  ED to Hosp-Admission (Discharged) from 01/02/2023 in [facility] from 12/03/2022 in [facility]   C-SSRS RISK CATEGORY No Risk No Risk No Risk      Past Psychiatric Medication Trials: Sertraline, fluoxetine, citalopram 50, nefazodone,  Lexapro 30 weight gain,  Viibryd 30, Wellbutrin 300 , Duloxetine 90 SE tremor, mirtazapine,  Trintellix 20 little response Auvelity  twice daily for 3 weeks no response Nortriptyline 75 level 86 NR Pramipexole 1 mg QID  poor resp and felt hyped  TMS  Lamotrigine 200 no response Abilify, Rexulti 0.5 Vraylar 1.5 daily tremor and lost response Lithium 300 shakes  methyl folate buspirone,  alprazolam, clonazepam, Under the care of [facility] since January 2001  Sister does well on lamotrigine.  Review of Systems:  Review of Systems  Constitutional:  Positive for diaphoresis. Negative for appetite change.  Cardiovascular:  Negative for palpitations.  Gastrointestinal:  Positive for constipation.  Neurological:  Positive for light-headedness.  Psychiatric/Behavioral:  Positive for decreased concentration, dysphoric mood and sleep disturbance. Negative for behavioral problems, confusion, hallucinations, self-injury and suicidal ideas. The patient is nervous/anxious. The patient is not hyperactive.     Medications: I have reviewed the patient's current medications.  Current Outpatient Medications  Medication Sig Dispense Refill   ALPRAZolam (XANAX) 1 MG tablet 1/2 tablet three times daily and 1 tablet at night 75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

## 2023-05-10 NOTE — Progress Notes (Signed)
NURSES NOTE:           Pt arrived for her #6 Spravato Treatment for treatment resistant depression, the starting dose was 56 mg (2 of the 28 mg nasal sprays) and she did receive 2 treatments of that the first week, today she will get her 4th  84 mg. Pt arrived very anxious and upset about Spravato again, Tried reassuring her that it wasn't anything new today. She remains very hesitant about doing Spravato, reports its "awful" and she reports feeling so scared. Nurse and her husband tried encouraging her as well as we could.  Pt's Spravato is Research officer, political party through [employer] Spravato medication is stored at treatment center per REMS/FDA guidelines. The medication is required to be locked behind two doors per REMS/FDA protocol. Medication is also disposed of properly after each use per regulations. All treatments are charted per FDA/REMS guidelines in Spravato Rems.          Began taking patient's vital signs at 1:25 PM 150/85, pulse 107, pulse ox 97%. pt remained anxious and asking questions, concerned about hair loss and reassured her it wasn't related to Spravato. Advised her it is probably from stress, she is not on any medication to cause that.  Informed her I would discuss with Dr. Jennelle Human about her vital signs prior to starting. Dr. Jennelle Human instructed nurse to give Clonidine 0.1 mg at the same time of starting her first Spravato dose. Pt given a blanket and pillow, she put her ear buds in and I helped her recline back. She wanted her husband to stay in room until she received all her inhalers then he could go out and sit. Pt given first dose 28 mg nasal spray at 1:40 PM, administered in each nostril as directed and observed by nurse, waited 5 more minutes for the second and third dose. After all 3 doses given pt did not complain of any nausea/vomiting, pt did have a drink and some candy to help with the taste of Spravato it gives the metallic taste. Pt reclined back today. Pt listened to her  relaxing music on her ear buds. Assessed her 40 minute vitals, she did not speak and kept her eyes closed, 2:20 PM, 146/87, pulse 104. Explained she would be monitored for a total time of 120 minutes. Discharge vitals were taken at 3:45 PM 136/85, P 93. Dr. Jennelle Human came to visit with patient at end of her treatment. Dr. Jennelle Human was with pt and her husband quite a bit of time, after coming out it was decided pt is stopping Spravato treatments since its ineffective and refer her for ECT. Pt's husband got on the phone immediately and started calling [facility] and [facility]. Dr. Jennelle Human also discontinued her Quetiapine and changed her to Olanzapine 10 mg at hs. A new Rx was sent per Dr. Jennelle Human. After [name] got off the phone with [facility], he reports they are hopefully able to get an evluation in 2 weeks. [facility] was a longer time frame he reports. Pt stayed for quite awhile after her treatment, she just didn't feel ready or able to go yet. Didn't need or ask for anything. When ready recommend she go home and sleep or just relax on the couch. No driving, no intense activities. Pt will not be returning for any further Spravato treatments.       LOT [id number] EXP OCT 2026

## 2023-05-14 ENCOUNTER — Telehealth: Payer: Self-pay | Admitting: Psychiatry

## 2023-05-14 NOTE — Telephone Encounter (Signed)
Referral sent to [facility] psychiatry for ECT

## 2023-05-14 NOTE — Telephone Encounter (Signed)
Left message to call back to discuss.

## 2023-05-14 NOTE — Telephone Encounter (Signed)
The decision to switch to olanzapine is the best decision for now and increasing to 15 mg HS may help more with sleep, anxiety, rumination and depression.   She took clonazepam in the past but not in the day for anxiety.  I could switch her from alprazolam to clonazepam to get longer duration for anxiety benefit.  It will not help sleep significantly more than clonazepam however.

## 2023-05-14 NOTE — Telephone Encounter (Signed)
Rtc to pt and she reports she is not sleeping with the Olanzapine 10 mg and Xanax 1 mg, reports she falls asleep okay but wakes up 3-4 hours later. Instructed pt to increase Olanzapine to 15 mg at hs along with continuing her 1 mg Xanax. She reports taking 0.5 mg Xanax tid but it only helps about 2 hours then wears off.   Informed her I would let Dr. Jennelle Human know and follow up with her.  Pt has upcoming apt with neurologist Dr. Arbutus Leas on 8/21, then has apt with Dr. Jennelle Human on 8/22.

## 2023-05-14 NOTE — Telephone Encounter (Signed)
Noted thank you

## 2023-05-14 NOTE — Telephone Encounter (Signed)
Please see message from patient.  She has already called twice today.

## 2023-05-14 NOTE — Telephone Encounter (Signed)
Pt LVM @ 9:18a stating the Xanax is not helping her get any rest.  She called back at 9:25a stating the the Olanzapine and the Xanax are allowing her to get any rest.  She wants Clarisse Gouge to call her back to discuss.  Next appt 8/22

## 2023-05-16 ENCOUNTER — Encounter: Payer: Self-pay | Admitting: Neurology

## 2023-05-16 ENCOUNTER — Ambulatory Visit (INDEPENDENT_AMBULATORY_CARE_PROVIDER_SITE_OTHER): Payer: Medicare Other | Admitting: Neurology

## 2023-05-16 VITALS — BP 140/90 | HR 94 | Ht 61.0 in | Wt 129.2 lb

## 2023-05-16 DIAGNOSIS — G251 Drug-induced tremor: Secondary | ICD-10-CM | POA: Diagnosis not present

## 2023-05-16 DIAGNOSIS — R413 Other amnesia: Secondary | ICD-10-CM | POA: Diagnosis not present

## 2023-05-16 NOTE — Patient Instructions (Signed)
You have been referred for a neurocognitive evaluation (i.e., evaluation of memory and thinking abilities). Please bring someone with you to this appointment if possible, as it is helpful for the neuropsychologist to hear from both you and another adult who knows you well. Please bring eyeglasses and hearing aids if you wear them and take any medications as you normally would. Please fully abstain from all alcohol, marijuana, or other substances prior to your appointment.   The evaluation will take approximately 2-3 hours and has two parts:   The first part is a clinical interview with the neuropsychologist, Dr. Milbert Coulter.  During the interview, the neuropsychologist will speak with you and the individual you brought to the appointment.    The second part of the evaluation is testing with the doctor's technician, aka psychometrician, Annabelle Harman or [employer]. During the testing, the technician will ask you to remember different types of material, solve problems, and answer some questionnaires. Your family member will not be present for this portion of the evaluation.   Please note: We have to reserve several hours of the neuropsychologist's time and the psychometrician's time for your evaluation appointment. As such, there is a No-Show fee of $100. If you are unable to attend any of your appointments, please contact our office as soon as possible to reschedule.

## 2023-05-17 ENCOUNTER — Ambulatory Visit (INDEPENDENT_AMBULATORY_CARE_PROVIDER_SITE_OTHER): Payer: Medicare Other | Admitting: Psychiatry

## 2023-05-17 ENCOUNTER — Encounter: Payer: Self-pay | Admitting: Psychiatry

## 2023-05-17 DIAGNOSIS — F411 Generalized anxiety disorder: Secondary | ICD-10-CM | POA: Diagnosis not present

## 2023-05-17 DIAGNOSIS — R251 Tremor, unspecified: Secondary | ICD-10-CM | POA: Diagnosis not present

## 2023-05-17 DIAGNOSIS — F5105 Insomnia due to other mental disorder: Secondary | ICD-10-CM | POA: Diagnosis not present

## 2023-05-17 DIAGNOSIS — F339 Major depressive disorder, recurrent, unspecified: Secondary | ICD-10-CM

## 2023-05-17 MED ORDER — PROPRANOLOL HCL 10 MG PO TABS
ORAL_TABLET | ORAL | 1 refills | Status: DC
Start: 2023-05-17 — End: 2023-06-19

## 2023-05-17 MED ORDER — CLONAZEPAM 1 MG PO TABS
ORAL_TABLET | ORAL | 0 refills | Status: DC
Start: 2023-05-17 — End: 2023-07-03

## 2023-05-17 MED ORDER — OLANZAPINE 15 MG PO TABS
15.0000 mg | ORAL_TABLET | Freq: Every day | ORAL | 0 refills | Status: DC
Start: 2023-05-17 — End: 2023-06-10

## 2023-05-17 MED ORDER — SERTRALINE HCL 100 MG PO TABS
200.0000 mg | ORAL_TABLET | Freq: Every day | ORAL | 0 refills | Status: DC
Start: 2023-05-17 — End: 2023-08-10

## 2023-05-17 NOTE — Progress Notes (Signed)
Andrea [name] [id number] 1951/02/28 72 y.o.   Subjective:   Patient ID:  Andrea [name] is a 72 y.o. (DOB 1951/09/12) female.  Chief Complaint:  Chief Complaint  Patient presents with   Follow-up   Depression   Anxiety   Sleeping Problem    HPI Andrea [name] presents to the office today for follow-up of MDE and anxiety disorder.  seen 09/2019.  No meds were changed.  Been on Lexapro 20 since early 2017.  02/27/2020 phone call from patient: Pt experiencing more anxiety than she normally has. Pt would like to know what are some options that she has as far as her medication. Pt will be in an appt  MD response: If the anxiety is just occasional then using alprazolam as needed would be an okay thing to do.  If it is been fairly persistent for a couple of weeks or more than the simplest solution would be to increase the Lexapro to 1-1/2 tablets daily.  Historically she has done well on Lexapro 20 mg daily for an extended period of time.  If she is under temporary stress then once that is resolved we could drop it back to 1 daily.  If this does not work as expected then she should schedule an earlier appointment. Pt response: Patient called back and she's been having temporary stress, but it's not bad. Things are great, she's working out and feeling okay for the most part. It's been going on for a bit so she agrees to the increase Lexapro 20 mg 1.5 tablets daily. She has been taking alprazolam at hs occasionally to help sleep better and it does help.   12/06/2020 appointment with the following noted:  No covid.  H Covid and recovered.   Had increased Lexapro to 30 mg for 90 days and saw a difference but started seeing weight gain and was doing oK and able to drop back and been OK. Still renovating a house for a year.  Sold her house and mourns the house. Calmed down.  Kept 3 gkids and dogs 10 days.   Andrea [name] for new house May 1. Rare alprazolam for HS. Stress moving to condo and would have crying  spell over the move and some problems she was running into dealing with it.  Hard with change.  But taking time to adjust to the idea of moving from her house of 23 years.  Initially refused but he left it up to her and she's come to peace about it.  Andrea Kenning.   Good response to medication and no SE.  Exercising is good medicine.  If doesn't then doesn't feel as good. Satisfied. Plan: no med changes  08/01/21  appt noted:  seen with [name] died Aug 11, 2023.  A lot of ups and downs per D.  Hard dips. Got worse when retired and dealing with the new  house.  Had trouble getting rid of things from the old house causing regrets over sellling the house.  D notes doesn't do well with instability  and stressors.  Ruminates on past mistakes. Blamed [name] for the  difficulty renovating the house.  Neg self talk and beats herself up. When not enough sleep will get depressed.  Does better if busy.   Dreads things.  Patient denies difficulty with sleep initiation or maintenance. Denies appetite disturbance.  Patient reports that energy and motivation have been good. Patient denies any difficulty with concentration.  Patient denies any suicidal ideation.  Plan: Abilify 2.5 mg daily for a week, then 5 mg daily. Continue Lexapro 20 mg daily  08/31/2021 appt noted: It worked immediately.  Insomnia with EMA. Normally 9 hours.  It's like I'm high.  Not tired.  Average 3-4 hours. Not depressed and no crazy negative thoughts.  Good response.  Energetic but up in middle of night. Plan: Okay to stop Abilify because of insomnia.   09/29/2021 appointment with the following noted: Parties were great.  Sleep problem resolved.  A lot of rest at the beach. Maybe the last week or 2 less motivation and socialization.  Not laying in the bed.  Just not as good as she was.  Not as outgoing as normal. No alcohol now.  Only had 1-2 at night.  Not ruminating.   Willing to restart Abilify at lower dose 2 mg daily. 3 grandsons   Plan: Relapse depression recurs she can resume Abilify  but lower dose 2 mg daily or every other day to try to avoid the insomnia where she can contact our office and we can discussed the possibility of an alternative antidepressant such as Trintellix or duloxetine.  12/28/2021 appointment noted: Several phone calls since she was here.  Abilify plus Lexapro failed.  Switch to duloxetine 90 mg which she started 12/02/2018 2023 Started lamotrigine. Also started Rexulti Sister has had cycling depression that has responded well to lamotrigine with poor response to SSRIs [name] with a little dementia. SE tremor 90 mg duloxetine, ? Wt gain over a few weeks.. Exercise and wt control Social isolation, low self esteem, worry about the way she looks and not usually that way. Crying better with duloxetine.  More dependent on H.   Anhedonia.  Black hole.  Never this low. No SI Having to use Xanax.   Asks about day treatment and TMS Plan: rec TMS Continue lamotrigine as prescribed Increase Rexulti to 1 mg daily Switch to Trintellix:  reduce duloxetine to 2 of the 30 mg capsules and start Trintellix 5 mg daily for 5 days,  Then Increase Trintellix to 10 mg daily and reduce duloxetine to 1 capsule for 1 week. Then stop duloxetine.  02/15/22 appt noted: On Trintellix , Rexulti 1 and lamotrigine I feel good.  Walks 3 miles daily is great for mental health. TMS would not be covered by [facility]. Started feeling better after a week or 2 after the last visit. SE appetite increased. No nausea. Not crying anymore. Sleeping well and no longer ruminates. Plan: Continue Trintellix but DC Rexulti due to weight gain.  Continue lamotrigine  03/06/2022 appointment with the following noted: Last 2 weeks has been very sad and nervous. Trintellix was increased to 20 mg daily  03/10/2022 complaining of ongoing depression, feeling jittery, negative thinking, early morning awakening, ruminating about past decisions.  Wanting to  consider TMS because she is desperate for improvement. Plan we will schedule urgent appointment  03/15/22 urgent appt noted:  seen with Andrea Kenning Very depressed, worst ever.  Don't want to be alone, ongoing worry, ruminating on past decisions.  Racing negative thoughts.  No motivation.  Trouble staying asleep.  Getting 4 hours and used to 9 hours. Dread getting up. Using Xanax Increased Trintellix to 20 mg 9 days ago. H agrees sleep is critical. Isolating.   Need to be better by July 11. Plan: Clonazepam 1 mg HS. Continue trintellix 20 mg daily she is only been on this dose 9 days and it needs more time. Re lamotrigine and increase  to 150 mg daily.Leafy Kindle 1.5 mg every other day Option TMS  03/24/22 appt noted: So much better.  Thinking straighter and not negative and not sad.  Sleeping better 8-9 hours.. No crying. H notices progressively better every day.  Mind clarity is much better.   No SE noted.  No hangover.  No nausea. Found a therapist, Krystal Eaton.   Feels well enough to go to [place] and kind of excited.  Back July 16.   Plan: Clonazepam 1 mg HS. This resolved insomnia without SE Continue trintellix 20 mg daily she is only been on this dose 20 days and it needs more time. Re lamotriginecontinue 150 mg daily.Leafy Kindle 1.5 mg every other day Option TMS  04/12/22 appt noted: Great.  Went to [place].  Was herself and enjoyed it.  Anxiety was managed.   [facility] and liked her. 3# wt gain.   Patient reports stable mood and denies depressed or irritable moods.  Patient denies any recent difficulty with anxiety.  Patient denies difficulty with sleep initiation or maintenance. Denies appetite disturbance.  Patient reports that energy and motivation have been good.  Patient denies any difficulty with concentration.  Patient denies any suicidal ideation. Sleeping well than not anxious. Plan: Trintellix 20  07/10/22 TC depression returned  recently.  08/15/22 appt noted: Still blunted  and diminished motivation.  Some days better than others.  Going to gym.  Partially better.   Still has a lot of anxiety and doesn't want to be alone. Functioning but not normal.  Dreads parties.   SE tremor for a couple of week.sleep great.  Dep 7/10. Current Vraylar 1.5 mg daily, increase lamotrigine 200 mg daily, Trintellix 20 , clonazepam 0.5 mg HS Plan: Reduce Clonazepam 1/2  mg HS. This resolved insomnia without SE Continue trintellix 20 mg daily only mildly effective. lamotrigine continue 200 mg daily.Jerrilyn Cairo DT lost response Auvelity Stop Vraylar After Thanksgiving stop Trintellix Wait 3 days then [employer] 1 in the morning for 1 week,  and if no side effects then increase Auvelity to 1 in the AM and 1 in the PM  09/04/22 TC: Complaining of persistent depression and wanting to do something else to help.  Added lithium CR 300 mg nightly  09/11/2022 phone call complaining of no improvement with Auvelity and lithium.  Appointment was moved up.  09/13/22 appt noted: Nothing changed. No better and no worse.  Awakens sad and tearful.  Sleeps well. Pushing herself to the gym.  Has a trainer. On clonazepam 0.5 mg HS She remains anxious when alone for no apparent reason.  Feelings of inferiority.  She is normally extroverted but now she is wanting to isolate from people.  All tasks seem monumental.  No enjoyment and not looking forward to anything.  Everything is a Personal assistant.  She remains generally worried and anxious which is not typical for her. Plan: Clonazepam 1/2  mg HS. This resolved insomnia without SE Stop Auvelity Start nortriptyline 1 of the 25 mg capsules at night for 4 nights then 2 at night for 4 nights then 3 at night, then wait 1 week and get the blood test in the morning at [facility] Reduce lamotrigine to 1 and 1/2 tablets daily  Lamotrigine has not been helpful to him 100 mg daily.  Start reducing lamotrigine 150 mg  daily and will continue to taper at next appointment.  09/27/21 urgent appt :  she wanted urgent appt bc desperate to feel better.  Seen with H Called at least twice since here for the same reasons of depression. Not sleeping well even with clonazepam 1 mg HS. Doesn't want to be alone. Shakey.   On lithium 300 mg daily, nortriptyline 75 mg HS.   Doesn't want to live like this but not acutely suicidal. Tolerating meds. Plan: pursue [facility] protocol TMS at [facility] bc faster optin than alternatives  10/30/22 TC: finished 50 TMS tx in the week and wants appt ASAP bc still struggling with crying and depresssion..  11/03/22 urgent appt : Completed TMS last week. Tue-Thur better days and then Friday nose-dived. Not done well since got back.   H notes high anxiety and crying in AM and PM and doesn't want to be alone.  Xanxax seems to help. Xanax helps and taking 0.5 mg AM and 1 mg HS H notes memory is sharper and she's more alert after treatment. Very anxioius all the time.  Get up sad.  Exercising. Sleep good with Xanax 1 mg HS. Plan: Increase Lexapro 20 mg dailly (should help crying and maybe anxiety but unlikely to resolve depression) Increase alprazolam to 0.5 mg TID and 1 mg HS for severe anxiety. H notes it helps. Pramipexole 0.25 BID for 5 days and if NR then 0.5 mg Bid off label. Reduce lamotrigine 100 mg for 2 weeks then 50 mg daily for 2 weeks then stop it. She wants to pursue Spravato if pramipexole fails  11/17/22 emergency work in appt: Pramipexole was not sent in and MD not aware until yesterday.  She is not any better and is desperate for a med change. Sleeping well and taking Xanax 1 mg HS Mornings are worse.  Not crying much.  Last Friday was a bad day and she didn't want to be alone.  But did let her H play golf once this week. Reduced lamotrigine today to 50 BID and weaning off. H says better this week than last week. Increased Lexapro to 20 mg daily. Plan: Increased Lexapro 20  mg dailly (helped crying and maybe anxiety but unlikely to resolve depression) Continue alprazolam to 0.5 mg TID prn anxiety and 1 mg HS. H notes it helps. Pramipexole 0.25 mg BID for 3 days then 0.5 mg BID Reduce lamotrigine 50 mg daily for 2 weeks then stop it. She wants to pursue Spravato if pramipexole fails  12/25/22 appt urgently. COMPLETED 1 WEEK TMS [facility] without benefit except TMS took away brain fog.   She feels need to take Xanax 0.5 mg TID  Some sleepiness with Xanax . Mornings are better and fine while at the gym but when home gets depressed again.   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 Needs Xanax to do social things. Plan: Increased Lexapro 20 mg dailly (helped crying and maybe anxiety but unlikely to resolve depression) Continue alprazolam to 0.5 mg TID prn anxiety and 1 mg HS. H notes it helps. Change pramipexole 0.5mg  tablets, 1 tablet four times daily for 1 week,  Then if not better 1and 1/2 tablets in the AM and dinner and 1 tablet at lunch and dinner.    4/9-4/15 hosp for ruptured appendix and missed meds: Can restart Lexapro at 1/2 tablet daily for 3 days then 1 daily. Restart 1/2 pramipexole tablet 3 times daily for 3 days then 1 tablet 3 times daily for 3 days then 1 and 1/2 tablet 3 times daily.      01/12/23 appt noted: No  GI sx now.  No appetite but working on protein.   I think I'm doing real well mentally.  Has resumed meds.  Mood has been great. Pramipexole 0.5mg   1 and 1/2 TID Lexapro 20 Xanax reduced to 1 mg HS H sees a difference.   No SE with meds.   No med changes  01/25/23 appt noted: Meds:  pramipexole 0.75 mg TID, Xanax 1.5 mg HS and 0.5 mg prn, Lexapro 20 mg daily. Doing great.  Staples removed and healing from surgery appendectomy. Back in the gym helps her.   Satisfied with meds. Getting out socially and feeling like her old self.  Chelsea's H Brad went to ER vomiting blood.   Plan: no med changes  02/07/23 appt noted: Back from [facility]  yesterday.  Had ear infection.  Then conjunctivitis.  Eyes hurt.   Before the surgery was getting better and feels like she is sliding back some.  Gradually a little worse since here.  Wonders if she could get better with joy and social drive.   Off Abx .  Sleep is good with meds.  With depression then gets anxious too.  Is functioning.   Meds:  pramipexole 0.75 mg TID, Xanax 1.5 mg HS and 0.5 mg prn, Lexapro 20 mg daily. Consistent and no SE. Plan: Continue Lexapro 20 mg dailly (helped crying and maybe anxiety but unlikely to resolve depression) Continue alprazolam  1 mg HS. H notes it helps. Increase pramipexole 0.5mg  tablets, 2 tablets 3 times daily  01/31/23 TC:Franchot Erichsen, CMA  to Me     02/20/23  4:56 PM Note Patient reporting she is not feeling as good as she thinks she should. Her anxiety is the biggest concern. She said she can go to the gym in the mornings and does ok, but she had a closing to go to recently and had to take 1/4 of a Xanax, reports it took the edge off. She reports daily tremors that are helped by Xanax. She is not crying, she is getting things done, sleeping okay. Will have her grandchildren next week and she isn't excited about it like she should be.    Taking 6 mirapex, 1.5 Xanax and Lexapro 20 mg.     MD resp:  CC   02/20/23  5:48 PM Note We just increased the pramipexole recently and I see her next week.  I do not want to increase the medicine any further until I see her again.      03/02/23 urgent appt noted: Some improvement with incr pramipexole 1 mg TID. Before felt worse in AM now feels better in AM and goes to gym. Otherwise no excitement.  Hard to make decisions. Less social and smiling.  Subdued more than normal.  Not confident.  Not enjoying present and ruminates in past.   No SE.  No impulsivity Has tremor for awhile .  Is mild for a month. Hard to conc.  Brain fog was better with TMS but is back. Continue Lexapro 20 mg dailly (helped crying  and maybe anxiety but unlikely to resolve depression) Continue alprazolam  1 mg HS. H notes it helps. Increase pramipexole 1 mg QID times daily  03/16/23 appt noted: Meds as above SE: If not enough food makes her excitable, urgent, talking fast.  Does it a bit too much.  Energy fine.   Feels better in the AM at the gym and when home a dark shadow comes over her.   Still feels dep overall.  Went to work with friends and didn't feel natural and didn't enjoy it like she should.  Pushes herself to stay active.   Takes 1.5 mg Xanax HS and then awakens and takes another 0.5 mg HS but gets enough sleep. A lot of brain fog.  Feels inferior and doesn't usually feel that way. Infrequent crying spells. Plan:  Continue Lexapro 20 mg dailly (helped crying and maybe anxiety but unlikely to resolve depression) Continue alprazolam  1 mg HS. H notes it helps. Reduce pramipexole 1 mg TID for 3 days, then 0.5 mg QID for 3 days, then 0.5 mg BID for 3 days, then 0.25 mg BID for 3 days, then stop it. Now start Latuda (lurasidone) 40 mg 1/2 tablet for 1 week.  If not benefit then 1 tablet daily with 350 calories  04/13/23 urgent appt note: with [facility]/23 for dep with SI "Worst I've ever been".  Feels out of control.  Shaking .  Can't sleep.  Feels disconnected.   Hated being hosp bc locked up.   Taking quetiapine 100 hS and lorazepam and still can't sleep. Started sertraline 50 and stopped Lexapro. Quetiapine 100 mg HS Plan: Stop hydroxyzine Increase quetiapine 200 then 300 mg at night for sleep and depression  Stop lorazepam and return to alprazolam 1 mg at night for sleep Increase sertraline to 100 mg daily or 2 of 50 mg nightly  04/23/23 appt noted: Here for first appt with Spravato.  Highly anxious about getting Spravato today.  Doesn't like feeling out of control and worries over this. Still very depressed.  Doesn't think her sx as noted are due to depression.  Thinks she has a neuro problem.  But she  has no other neuro sx like numbness, weakness, balance px, or anything other than dep and anxiety sx. Received Spravato 56 mg today.  "It was horrible!" Bc she felt out of control and was anxious receiving it.  Thought it would last forever re: dissociation.  It was scary to her.  Couldn't relax for it.  No N, V, HA.  Ambivalent about continuing Spravato but open. No specific concerns with meds.    04/25/23 appt noted: Needed Xanax before visit today to overcome fear of coming to the appt. She was more disciplined about controlling neg thought during Spravato admin and had a much better experience.  It was not scary nor associated with sig fear.  She is still dep and anxious without change otherwise since seen a couple of days ago. Tolerating med changes from last week.   Received Spravato 56 mg again and tolerated it without adverse SE.  Typical SE dissociation, no NV, palpitations, HA.  Motivated to continue the Spravato and agrees to increase it.  Better experience than the first time. Plan: Increase quetiapine to 400 mg HS  04/26/23 TC:  Patient taking 300 mg of Seroquel and 1 mg of Xanax for sleep. She can get to sleep, just can't stay asleep. Doesn't say how long she is sleeping, but says she needs 9 hours of sleep. Reporting social isolation is bad, she won't leave the house, but doesn't like being home alone.  Reports she has never been this sick mentally and feels like she may need to go to the hospital. No SI, just doesn't like being alone.     MD resp:  Pt just seen yesterday and received 2nd Spravato 56 mg .  That was positive experience. However she is still severely depressed and ruminating and reassurance  seeking..  She doesn't need to go to the hospital. She told me yesterday she slept 7 hours but needs 9 hours.  7 hours is not bad.   She is taking quetiapne 300 mg HS with Xanax 1 mg HS.  No increase in Xanax but can increase quetiapine to 400 mg HS (max dose 800) and this could help  sleep and anxiety and depression. She should keep appt next week for next Spravato.  In case she asks, Spravato is not making her worse in any way.     05/01/23 appt noted;H present also Current meds; quetiapine 400 mg HS, sertraline 100 mg daily, alprazolam 0.5 mg TID and 1 mg HS. Tolerating med changes.    Received Spravato 84 mg first time.  Typical SE dissociation, no NV, palpitations, HA.  However she had a bad experience DT severe anxiety before starting the Spravato.   Very fearful, "scared".  Without specific reasons.  Severe anxiety dep, hopeless.  No SI but doesn't want to live like this.  So anxious hard to sleep and some crying spells. Seroquel not helping sleep.  05/03/23 appt noted: Current meds; quetiapine 400 mg HS, sertraline 100 mg daily, alprazolam 0.5 mg TID and 1 mg HS. Tolerating med changes.    Received Spravato 84 mg.  Typical SE dissociation, no NV, palpitations, HA.  However she had a bad experience DT severe anxiety before starting the Spravato.   Highly anxious, fearful of "everything" including Spravato admin.  Afraid she will forget who she is despite having it before.  Still anxious and trouble sleeping at home.  Sleep no better with quetiapine and still ruminatiing.   Plan increase quetiapine to 500 mg HS  05/08/23 appt noted: Meds: quetiapine 500 mg HS, sertraline 100 mg daily, alprazolam 0.5 mg TID and 1 mg HS. Received Spravato 84 mg 3rd time.  Typical SE dissociation, no NV, palpitations, HA.  However she had a bad experience DT severe anxiety before starting the Spravato.   She is not better so far.  Ongoing severe depression anxiety, avoidance, anhedonia, rumination, afraid to be alone. All not typical of herself.   No SE with meds except constipation and sweating at night.  Plan:  Increase quetiapine to 600 mg at night for sleep and depression & rumination and next week to 600 HS increased alprazolam 1 mg at night for sleep and 0.5 mg TID Increase sertraline  150 mg daily  Spravato 84 mg AM twice weekly for severe TRD  05/10/23 appt noted:  Meds: quetiapine 600 mg HS, sertraline 150 mg daily, alprazolam 0.5 mg TID and 1 mg HS. Received Spravato 84 mg 4th time.  Typical SE dissociation, no NV, palpitations, HA.  However she had a bad experience DT severe anxiety before starting the Spravato.   She is not better so far.  Ongoing severe depression anxiety, avoidance, anhedonia, rumination, afraid to be alone. All not typical of herself.   SE constipation she finds unmanageable  Did sleep better with increased quetiapine 600 mg Hs but cannot continue dT constipation.  However dep and anxiety are no better with Spravato or the other treatments except better sleep.  Anxiety ongoing severe with rumination.  05/17/23 appt noted:  Meds: switched to olanzapine 15 mg HS, sertraline 150, alprazolam  Tremor seems worse but H disagrees. Mood continually worse.  She is dependent on H and afraid to be alone.  No sweating. Xanax helped but wears off after a couple of hours.  She is  open to antoher option Tolerating med changes. No improvement except slept a little better last night but still not enough. No Spravato today bc 2 weeks of it didn't help.  Agrees to pursue ECT but hoping med changes will help Crying spells.   ECT-MADRS    Flowsheet Row Office Visit from 04/13/2023 in [facility] Office Visit from 11/03/2022 in [facility]  MADRS Total Score 45 40      GAD-7    Flowsheet Row Office Visit from 05/02/2023 in [facility]  Total GAD-7 Score 21      PHQ2-9    Flowsheet Row Office Visit from 05/02/2023 in [facility] Clinical Support from 02[facility]24/2022 in [facility] Video Visit from 06/14/2021 in [facility] [place]  PHQ-2 Total Score 6 0 0  PHQ-9 Total Score 24 -- 0       Flowsheet Row ED from 01/31/2023 in [facility]  ED to Hosp-Admission (Discharged) from 01[facility]02/2023 in [facility] ED from 12/03/2022 in [facility]   C-SSRS RISK CATEGORY No Risk No Risk No Risk      Past Psychiatric Medication Trials: Sertraline, fluoxetine, citalopram 50, nefazodone,  Lexapro 30 weight gain,  Viibryd 30, Wellbutrin 300 , Duloxetine 90 SE tremor, mirtazapine,  Trintellix 20 little response Auvelity twice daily for 3 weeks no response Nortriptyline 75 level 86 NR Pramipexole 1 mg QID  poor resp and felt hyped  TMS  Lamotrigine 200 no response Abilify, Rexulti 0.5 Vraylar 1.5 daily tremor and lost response Lithium 300 shakes  methyl folate buspirone,  alprazolam, clonazepam, Under the care of [facility] since January 2001  Sister does well on lamotrigine.  Review of Systems:  Review of Systems  Constitutional:  Positive for diaphoresis. Negative for appetite change.  Cardiovascular:  Negative for palpitations.  Neurological:  Positive for light-headedness.  Psychiatric/Behavioral:  Positive for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

## 2023-05-17 NOTE — Patient Instructions (Signed)
Propranolol 10 mg tablet 2-3 tablets twice daily as needed for tremor Stop alprazolam and start clonazepam 1 mg tablet 1/2 twice daily and 1 tablet at night Increase sertraline to 2 tablets daily.

## 2023-05-22 ENCOUNTER — Institutional Professional Consult (permissible substitution): Payer: Medicare Other | Admitting: Psychology

## 2023-05-22 ENCOUNTER — Ambulatory Visit: Payer: Self-pay

## 2023-06-01 ENCOUNTER — Encounter: Payer: Medicare Other | Admitting: Psychology

## 2023-06-05 DIAGNOSIS — K641 Second degree hemorrhoids: Secondary | ICD-10-CM | POA: Diagnosis not present

## 2023-06-05 DIAGNOSIS — K5904 Chronic idiopathic constipation: Secondary | ICD-10-CM | POA: Diagnosis not present

## 2023-06-05 DIAGNOSIS — K5730 Diverticulosis of large intestine without perforation or abscess without bleeding: Secondary | ICD-10-CM | POA: Diagnosis not present

## 2023-06-08 ENCOUNTER — Other Ambulatory Visit: Payer: Self-pay | Admitting: Psychiatry

## 2023-06-08 DIAGNOSIS — R251 Tremor, unspecified: Secondary | ICD-10-CM

## 2023-06-08 DIAGNOSIS — F411 Generalized anxiety disorder: Secondary | ICD-10-CM

## 2023-06-08 DIAGNOSIS — F339 Major depressive disorder, recurrent, unspecified: Secondary | ICD-10-CM

## 2023-06-12 DIAGNOSIS — J320 Chronic maxillary sinusitis: Secondary | ICD-10-CM | POA: Diagnosis not present

## 2023-06-15 ENCOUNTER — Ambulatory Visit: Payer: Medicare Other | Admitting: Psychiatry

## 2023-06-19 ENCOUNTER — Other Ambulatory Visit: Payer: Self-pay | Admitting: Psychiatry

## 2023-06-19 DIAGNOSIS — F411 Generalized anxiety disorder: Secondary | ICD-10-CM

## 2023-06-19 DIAGNOSIS — R251 Tremor, unspecified: Secondary | ICD-10-CM

## 2023-06-20 ENCOUNTER — Ambulatory Visit: Payer: Medicare Other | Admitting: Psychiatry

## 2023-06-20 DIAGNOSIS — H02849 Edema of unspecified eye, unspecified eyelid: Secondary | ICD-10-CM | POA: Diagnosis not present

## 2023-06-25 DIAGNOSIS — K642 Third degree hemorrhoids: Secondary | ICD-10-CM | POA: Diagnosis not present

## 2023-06-28 ENCOUNTER — Ambulatory Visit: Payer: Medicare Other | Admitting: Clinical

## 2023-07-03 ENCOUNTER — Other Ambulatory Visit: Payer: Self-pay

## 2023-07-03 ENCOUNTER — Telehealth: Payer: Self-pay | Admitting: Psychiatry

## 2023-07-03 DIAGNOSIS — F5105 Insomnia due to other mental disorder: Secondary | ICD-10-CM

## 2023-07-03 DIAGNOSIS — F411 Generalized anxiety disorder: Secondary | ICD-10-CM

## 2023-07-03 MED ORDER — CLONAZEPAM 1 MG PO TABS
ORAL_TABLET | ORAL | 0 refills | Status: DC
Start: 2023-07-03 — End: 2023-08-21

## 2023-07-03 NOTE — Telephone Encounter (Signed)
Pt LVM @ 4:53p on 10/7.  She is requesting refill of Clonazepam to   [id number] [address], [facility] [address], [place] [address] Phone: [phone]  Fax: [phone]   She had 1 pill left. Next appt 10/15

## 2023-07-03 NOTE — Telephone Encounter (Signed)
pended

## 2023-07-04 ENCOUNTER — Other Ambulatory Visit: Payer: Self-pay | Admitting: Psychiatry

## 2023-07-04 DIAGNOSIS — F339 Major depressive disorder, recurrent, unspecified: Secondary | ICD-10-CM

## 2023-07-04 NOTE — Telephone Encounter (Signed)
LF 06/10/23; LV 05/17/23; NV 07/10/23  ADDED DX F33.9 to rx request.

## 2023-07-05 ENCOUNTER — Ambulatory Visit: Payer: Medicare Other | Admitting: Clinical

## 2023-07-09 DIAGNOSIS — K642 Third degree hemorrhoids: Secondary | ICD-10-CM | POA: Diagnosis not present

## 2023-07-10 ENCOUNTER — Encounter: Payer: Self-pay | Admitting: Psychiatry

## 2023-07-10 ENCOUNTER — Ambulatory Visit: Payer: Medicare Other | Admitting: Psychiatry

## 2023-07-10 DIAGNOSIS — F411 Generalized anxiety disorder: Secondary | ICD-10-CM

## 2023-07-10 DIAGNOSIS — F339 Major depressive disorder, recurrent, unspecified: Secondary | ICD-10-CM | POA: Diagnosis not present

## 2023-07-10 DIAGNOSIS — F5105 Insomnia due to other mental disorder: Secondary | ICD-10-CM

## 2023-07-10 DIAGNOSIS — R251 Tremor, unspecified: Secondary | ICD-10-CM | POA: Diagnosis not present

## 2023-07-10 NOTE — Progress Notes (Signed)
Andrea [name] [id number] 10-12-1950 72 y.o.   Subjective:   Patient ID:  Andrea [name] is a 72 y.o. (DOB 01/19/1951) female.  Chief Complaint:  Chief Complaint  Patient presents with   Follow-up    Severe depression and severe anxiety   Medication Problem    HPI Andrea [name] presents to the office today for follow-up of MDE and anxiety disorder.  seen 09/2019.  No meds were changed.  Been on Lexapro 20 since early 2017.  02/27/2020 phone call from patient: Pt experiencing more anxiety than she normally has. Pt would like to know what are some options that she has as far as her medication. Pt will be in an appt  MD response: If the anxiety is just occasional then using alprazolam as needed would be an okay thing to do.  If it is been fairly persistent for a couple of weeks or more than the simplest solution would be to increase the Lexapro to 1-1/2 tablets daily.  Historically she has done well on Lexapro 20 mg daily for an extended period of time.  If she is under temporary stress then once that is resolved we could drop it back to 1 daily.  If this does not work as expected then she should schedule an earlier appointment. Pt response: Patient called back and she's been having temporary stress, but it's not bad. Things are great, she's working out and feeling okay for the most part. It's been going on for a bit so she agrees to the increase Lexapro 20 mg 1.5 tablets daily. She has been taking alprazolam at hs occasionally to help sleep better and it does help.   12/06/2020 appointment with the following noted:  No covid.  H Covid and recovered.   Had increased Lexapro to 30 mg for 90 days and saw a difference but started seeing weight gain and was doing oK and able to drop back and been OK. Still renovating a house for a year.  Sold her house and mourns the house. Calmed down.  Kept 3 gkids and dogs 10 days.   Andrea [name] Hopes for new house May 1. Rare alprazolam for HS. Stress moving to condo and  would have crying spell over the move and some problems she was running into dealing with it.  Hard with change.  But taking time to adjust to the idea of moving from her house of 23 years.  Initially refused but he left it up to her and she's come to peace about it.  Andrea [name].   Good response to medication and no SE.  Exercising is good medicine.  If doesn't then doesn't feel as good. Satisfied. Plan: no med changes  08/01/21  appt noted:  seen with D Andrea [name] Mo died 08-02-2023.  A lot of ups and downs per D.  Hard dips. Got worse when retired and dealing with the new  house.  Had trouble getting rid of things from the old house causing regrets over sellling the house.  D notes doesn't do well with instability  and stressors.  Ruminates on past mistakes. Blamed Andrea [name] for the  difficulty renovating the house.  Neg self talk and beats herself up. When not enough sleep will get depressed.  Does better if busy.   Dreads things.  Patient denies difficulty with sleep initiation or maintenance. Denies appetite disturbance.  Patient reports that energy and motivation have been good. Patient denies any difficulty with concentration.  Patient denies any suicidal  ideation.  Plan: Abilify 2.5 mg daily for a week, then 5 mg daily. Continue Lexapro 20 mg daily  08/31/2021 appt noted: It worked immediately.  Insomnia with EMA. Normally 9 hours.  It's like I'm high.  Not tired.  Average 3-4 hours. Not depressed and no crazy negative thoughts.  Good response.  Energetic but up in middle of night. Plan: Okay to stop Abilify because of insomnia.   09/29/2021 appointment with the following noted: Parties were great.  Sleep problem resolved.  A lot of rest at the beach. Maybe the last week or 2 less motivation and socialization.  Not laying in the bed.  Just not as good as she was.  Not as outgoing as normal. No alcohol now.  Only had 1-2 at night.  Not ruminating.   Willing to restart Abilify at lower dose 2 mg  daily. 3 grandsons  Plan: Relapse depression recurs she can resume Abilify  but lower dose 2 mg daily or every other day to try to avoid the insomnia where she can contact our office and we can discussed the possibility of an alternative antidepressant such as Trintellix or duloxetine.  12/28/2021 appointment noted: Several phone calls since she was here.  Abilify plus Lexapro failed.  Switch to duloxetine 90 mg which she started 12/02/2018 2023 Started lamotrigine. Also started Rexulti Sister has had cycling depression that has responded well to lamotrigine with poor response to SSRIs [name] with a little dementia. SE tremor 90 mg duloxetine, ? Wt gain over a few weeks.. Exercise and wt control Social isolation, low self esteem, worry about the way she looks and not usually that way. Crying better with duloxetine.  More dependent on H.   Anhedonia.  Black hole.  Never this low. No SI Having to use Xanax.   Asks about day treatment and TMS Plan: rec TMS Continue lamotrigine as prescribed Increase Rexulti to 1 mg daily Switch to Trintellix:  reduce duloxetine to 2 of the 30 mg capsules and start Trintellix 5 mg daily for 5 days,  Then Increase Trintellix to 10 mg daily and reduce duloxetine to 1 capsule for 1 week. Then stop duloxetine.  02/15/22 appt noted: On Trintellix , Rexulti 1 and lamotrigine I feel good.  Walks 3 miles daily is great for mental health. TMS would not be covered by [facility]. Started feeling better after a week or 2 after the last visit. SE appetite increased. No nausea. Not crying anymore. Sleeping well and no longer ruminates. Plan: Continue Trintellix but DC Rexulti due to weight gain.  Continue lamotrigine  03/06/2022 appointment with the following noted: Last 2 weeks has been very sad and nervous. Trintellix was increased to 20 mg daily  03/10/2022 complaining of ongoing depression, feeling jittery, negative thinking, early morning awakening, ruminating about past  decisions.  Wanting to consider TMS because she is desperate for improvement. Plan we will schedule urgent appointment  03/15/22 urgent appt noted:  seen with Andrea [name] Very depressed, worst ever.  Don't want to be alone, ongoing worry, ruminating on past decisions.  Racing negative thoughts.  No motivation.  Trouble staying asleep.  Getting 4 hours and used to 9 hours. Dread getting up. Using Xanax Increased Trintellix to 20 mg 9 days ago. H agrees sleep is critical. Isolating.   Need to be better by July 11. Plan: Clonazepam 1 mg HS. Continue trintellix 20 mg daily she is only been on this dose 9 days and it needs more time. Re lamotrigine  and increase to 150 mg daily.Leafy Kindle 1.5 mg every other day Option TMS  03/24/22 appt noted: So much better.  Thinking straighter and not negative and not sad.  Sleeping better 8-9 hours.. No crying. H notices progressively better every day.  Mind clarity is much better.   No SE noted.  No hangover.  No nausea. Found a therapist, Krystal Eaton.   Feels well enough to go to [place] and kind of excited.  Back July 16.   Plan: Clonazepam 1 mg HS. This resolved insomnia without SE Continue trintellix 20 mg daily she is only been on this dose 20 days and it needs more time. Re lamotriginecontinue 150 mg daily.Leafy Kindle 1.5 mg every other day Option TMS  04/12/22 appt noted: Great.  Went to [place].  Was herself and enjoyed it.  Anxiety was managed.   [facility] and liked her. 3# wt gain.   Patient reports stable mood and denies depressed or irritable moods.  Patient denies any recent difficulty with anxiety.  Patient denies difficulty with sleep initiation or maintenance. Denies appetite disturbance.  Patient reports that energy and motivation have been good.  Patient denies any difficulty with concentration.  Patient denies any suicidal ideation. Sleeping well than not anxious. Plan: Trintellix 20  07/10/22 TC depression  returned recently.  08/15/22 appt noted: Still blunted  and diminished motivation.  Some days better than others.  Going to gym.  Partially better.   Still has a lot of anxiety and doesn't want to be alone. Functioning but not normal.  Dreads parties.   SE tremor for a couple of week.sleep great.  Dep 7/10. Current Vraylar 1.5 mg daily, increase lamotrigine 200 mg daily, Trintellix 20 , clonazepam 0.5 mg HS Plan: Reduce Clonazepam 1/2  mg HS. This resolved insomnia without SE Continue trintellix 20 mg daily only mildly effective. lamotrigine continue 200 mg daily.Jerrilyn Cairo DT lost response Auvelity Stop Vraylar After Thanksgiving stop Trintellix Wait 3 days then [employer] 1 in the morning for 1 week,  and if no side effects then increase Auvelity to 1 in the AM and 1 in the PM  09/04/22 TC: Complaining of persistent depression and wanting to do something else to help.  Added lithium CR 300 mg nightly  09/11/2022 phone call complaining of no improvement with Auvelity and lithium.  Appointment was moved up.  09/13/22 appt noted: Nothing changed. No better and no worse.  Awakens sad and tearful.  Sleeps well. Pushing herself to the gym.  Has a trainer. On clonazepam 0.5 mg HS She remains anxious when alone for no apparent reason.  Feelings of inferiority.  She is normally extroverted but now she is wanting to isolate from people.  All tasks seem monumental.  No enjoyment and not looking forward to anything.  Everything is a Personal assistant.  She remains generally worried and anxious which is not typical for her. Plan: Clonazepam 1/2  mg HS. This resolved insomnia without SE Stop Auvelity Start nortriptyline 1 of the 25 mg capsules at night for 4 nights then 2 at night for 4 nights then 3 at night, then wait 1 week and get the blood test in the morning at [facility] Reduce lamotrigine to 1 and 1/2 tablets daily  Lamotrigine has not been helpful to him 100 mg daily.  Start reducing lamotrigine  150 mg daily and will continue to taper at next appointment.  09/27/21 urgent appt :  she wanted urgent appt bc desperate to feel better.  Seen with H Called at least twice since here for the same reasons of depression. Not sleeping well even with clonazepam 1 mg HS. Doesn't want to be alone. Shakey.   On lithium 300 mg daily, nortriptyline 75 mg HS.   Doesn't want to live like this but not acutely suicidal. Tolerating meds. Plan: pursue [facility] protocol TMS at [facility] bc faster optin than alternatives  10/30/22 TC: finished 50 TMS tx in the week and wants appt ASAP bc still struggling with crying and depresssion..  11/03/22 urgent appt : Completed TMS last week. Tue-Thur better days and then Friday nose-dived. Not done well since got back.   H notes high anxiety and crying in AM and PM and doesn't want to be alone.  Xanxax seems to help. Xanax helps and taking 0.5 mg AM and 1 mg HS H notes memory is sharper and she's more alert after treatment. Very anxioius all the time.  Get up sad.  Exercising. Sleep good with Xanax 1 mg HS. Plan: Increase Lexapro 20 mg dailly (should help crying and maybe anxiety but unlikely to resolve depression) Increase alprazolam to 0.5 mg TID and 1 mg HS for severe anxiety. H notes it helps. Pramipexole 0.25 BID for 5 days and if NR then 0.5 mg Bid off label. Reduce lamotrigine 100 mg for 2 weeks then 50 mg daily for 2 weeks then stop it. She wants to pursue Spravato if pramipexole fails  11/17/22 emergency work in appt: Pramipexole was not sent in and MD not aware until yesterday.  She is not any better and is desperate for a med change. Sleeping well and taking Xanax 1 mg HS Mornings are worse.  Not crying much.  Last Friday was a bad day and she didn't want to be alone.  But did let her H play golf once this week. Reduced lamotrigine today to 50 BID and weaning off. H says better this week than last week. Increased Lexapro to 20 mg daily. Plan: Increased  Lexapro 20 mg dailly (helped crying and maybe anxiety but unlikely to resolve depression) Continue alprazolam to 0.5 mg TID prn anxiety and 1 mg HS. H notes it helps. Pramipexole 0.25 mg BID for 3 days then 0.5 mg BID Reduce lamotrigine 50 mg daily for 2 weeks then stop it. She wants to pursue Spravato if pramipexole fails  12/25/22 appt urgently. COMPLETED 1 WEEK TMS [facility] without benefit except TMS took away brain fog.   She feels need to take Xanax 0.5 mg TID  Some sleepiness with Xanax . Mornings are better and fine while at the gym but when home gets depressed again.   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 Needs Xanax to do social things. Plan: Increased Lexapro 20 mg dailly (helped crying and maybe anxiety but unlikely to resolve depression) Continue alprazolam to 0.5 mg TID prn anxiety and 1 mg HS. H notes it helps. Change pramipexole 0.5mg  tablets, 1 tablet four times daily for 1 week,  Then if not better 1and 1/2 tablets in the AM and dinner and 1 tablet at lunch and dinner.    4/9-4/15 hosp for ruptured appendix and missed meds: Can restart Lexapro at 1/2 tablet daily for 3 days then 1 daily. Restart 1/2 pramipexole tablet 3 times daily for 3 days then 1 tablet 3 times daily for 3 days then 1 and 1/2 tablet 3 times daily.      01/12/23 appt noted: No  GI sx now.  No appetite but working on protein.   I think I'm doing real well mentally.  Has resumed meds.  Mood has been great. Pramipexole 0.5mg   1 and 1/2 TID Lexapro 20 Xanax reduced to 1 mg HS H sees a difference.   No SE with meds.   No med changes  01/25/23 appt noted: Meds:  pramipexole 0.75 mg TID, Xanax 1.5 mg HS and 0.5 mg prn, Lexapro 20 mg daily. Doing great.  Staples removed and healing from surgery appendectomy. Back in the gym helps her.   Satisfied with meds. Getting out socially and feeling like her old self.  Andrea [name]'s H Brad went to ER vomiting blood.   Plan: no med changes  02/07/23 appt noted: Back from  [facility] yesterday.  Had ear infection.  Then conjunctivitis.  Eyes hurt.   Before the surgery was getting better and feels like she is sliding back some.  Gradually a little worse since here.  Wonders if she could get better with joy and social drive.   Off Abx .  Sleep is good with meds.  With depression then gets anxious too.  Is functioning.   Meds:  pramipexole 0.75 mg TID, Xanax 1.5 mg HS and 0.5 mg prn, Lexapro 20 mg daily. Consistent and no SE. Plan: Continue Lexapro 20 mg dailly (helped crying and maybe anxiety but unlikely to resolve depression) Continue alprazolam  1 mg HS. H notes it helps. Increase pramipexole 0.5mg  tablets, 2 tablets 3 times daily  01/31/23 TC:Andrea [name], CMA  to Me     02/20/23  4:56 PM Note Patient reporting she is not feeling as good as she thinks she should. Her anxiety is the biggest concern. She said she can go to the gym in the mornings and does ok, but she had a closing to go to recently and had to take 1/4 of a Xanax, reports it took the edge off. She reports daily tremors that are helped by Xanax. She is not crying, she is getting things done, sleeping okay. Will have her grandchildren next week and she isn't excited about it like she should be.    Taking 6 mirapex, 1.5 Xanax and Lexapro 20 mg.     MD resp:  CC   02/20/23  5:48 PM Note We just increased the pramipexole recently and I see her next week.  I do not want to increase the medicine any further until I see her again.      03/02/23 urgent appt noted: Some improvement with incr pramipexole 1 mg TID. Before felt worse in AM now feels better in AM and goes to gym. Otherwise no excitement.  Hard to make decisions. Less social and smiling.  Subdued more than normal.  Not confident.  Not enjoying present and ruminates in past.   No SE.  No impulsivity Has tremor for awhile .  Is mild for a month. Hard to conc.  Brain fog was better with TMS but is back. Continue Lexapro 20 mg dailly (helped  crying and maybe anxiety but unlikely to resolve depression) Continue alprazolam  1 mg HS. H notes it helps. Increase pramipexole 1 mg QID times daily  03/16/23 appt noted: Meds as above SE: If not enough food makes her excitable, urgent, talking fast.  Does it a bit too much.  Energy fine.   Feels better in the AM at the gym and when home a dark shadow comes over her.   Still feels dep overall.  Went to work with friends and didn't feel natural and didn't enjoy it like she should.  Pushes herself to stay active.   Takes 1.5 mg Xanax HS and then awakens and takes another 0.5 mg HS but gets enough sleep. A lot of brain fog.  Feels inferior and doesn't usually feel that way. Infrequent crying spells. Plan:  Continue Lexapro 20 mg dailly (helped crying and maybe anxiety but unlikely to resolve depression) Continue alprazolam  1 mg HS. H notes it helps. Reduce pramipexole 1 mg TID for 3 days, then 0.5 mg QID for 3 days, then 0.5 mg BID for 3 days, then 0.25 mg BID for 3 days, then stop it. Now start Latuda (lurasidone) 40 mg 1/2 tablet for 1 week.  If not benefit then 1 tablet daily with 350 calories  04/13/23 urgent appt note: with [facility] 04/05/23 for dep with SI "Worst I've ever been".  Feels out of control.  Shaking .  Can't sleep.  Feels disconnected.   Hated being hosp bc locked up.   Taking quetiapine 100 hS and lorazepam and still can't sleep. Started sertraline 50 and stopped Lexapro. Quetiapine 100 mg HS Plan: Stop hydroxyzine Increase quetiapine 200 then 300 mg at night for sleep and depression  Stop lorazepam and return to alprazolam 1 mg at night for sleep Increase sertraline to 100 mg daily or 2 of 50 mg nightly  04/23/23 appt noted: Here for first appt with Spravato.  Highly anxious about getting Spravato today.  Doesn't like feeling out of control and worries over this. Still very depressed.  Doesn't think her sx as noted are due to depression.  Thinks she has a neuro problem.   But she has no other neuro sx like numbness, weakness, balance px, or anything other than dep and anxiety sx. Received Spravato 56 mg today.  "It was horrible!" Bc she felt out of control and was anxious receiving it.  Thought it would last forever re: dissociation.  It was scary to her.  Couldn't relax for it.  No N, V, HA.  Ambivalent about continuing Spravato but open. No specific concerns with meds.    04/25/23 appt noted: Needed Xanax before visit today to overcome fear of coming to the appt. She was more disciplined about controlling neg thought during Spravato admin and had a much better experience.  It was not scary nor associated with sig fear.  She is still dep and anxious without change otherwise since seen a couple of days ago. Tolerating med changes from last week.   Received Spravato 56 mg again and tolerated it without adverse SE.  Typical SE dissociation, no NV, palpitations, HA.  Motivated to continue the Spravato and agrees to increase it.  Better experience than the first time. Plan: Increase quetiapine to 400 mg HS  04/26/23 TC:  Patient taking 300 mg of Seroquel and 1 mg of Xanax for sleep. She can get to sleep, just can't stay asleep. Doesn't say how long she is sleeping, but says she needs 9 hours of sleep. Reporting social isolation is bad, she won't leave the house, but doesn't like being home alone.  Reports she has never been this sick mentally and feels like she may need to go to the hospital. No SI, just doesn't like being alone.     MD resp:  Pt just seen yesterday and received 2nd Spravato 56 mg .  That was positive experience. However she is still severely depressed and ruminating and reassurance  seeking..  She doesn't need to go to the hospital. She told me yesterday she slept 7 hours but needs 9 hours.  7 hours is not bad.   She is taking quetiapne 300 mg HS with Xanax 1 mg HS.  No increase in Xanax but can increase quetiapine to 400 mg HS (max dose 800) and this  could help sleep and anxiety and depression. She should keep appt next week for next Spravato.  In case she asks, Spravato is not making her worse in any way.     05/01/23 appt noted;H present also Current meds; quetiapine 400 mg HS, sertraline 100 mg daily, alprazolam 0.5 mg TID and 1 mg HS. Tolerating med changes.    Received Spravato 84 mg first time.  Typical SE dissociation, no NV, palpitations, HA.  However she had a bad experience DT severe anxiety before starting the Spravato.   Very fearful, "scared".  Without specific reasons.  Severe anxiety dep, hopeless.  No SI but doesn't want to live like this.  So anxious hard to sleep and some crying spells. Seroquel not helping sleep.  05/03/23 appt noted: Current meds; quetiapine 400 mg HS, sertraline 100 mg daily, alprazolam 0.5 mg TID and 1 mg HS. Tolerating med changes.    Received Spravato 84 mg.  Typical SE dissociation, no NV, palpitations, HA.  However she had a bad experience DT severe anxiety before starting the Spravato.   Highly anxious, fearful of "everything" including Spravato admin.  Afraid she will forget who she is despite having it before.  Still anxious and trouble sleeping at home.  Sleep no better with quetiapine and still ruminatiing.   Plan increase quetiapine to 500 mg HS  05/08/23 appt noted: Meds: quetiapine 500 mg HS, sertraline 100 mg daily, alprazolam 0.5 mg TID and 1 mg HS. Received Spravato 84 mg 3rd time.  Typical SE dissociation, no NV, palpitations, HA.  However she had a bad experience DT severe anxiety before starting the Spravato.   She is not better so far.  Ongoing severe depression anxiety, avoidance, anhedonia, rumination, afraid to be alone. All not typical of herself.   No SE with meds except constipation and sweating at night.  Plan:  Increase quetiapine to 600 mg at night for sleep and depression & rumination and next week to 600 HS increased alprazolam 1 mg at night for sleep and 0.5 mg TID Increase  sertraline 150 mg daily  Spravato 84 mg AM twice weekly for severe TRD  05/10/23 appt noted:  Meds: quetiapine 600 mg HS, sertraline 150 mg daily, alprazolam 0.5 mg TID and 1 mg HS. Received Spravato 84 mg 4th time.  Typical SE dissociation, no NV, palpitations, HA.  However she had a bad experience DT severe anxiety before starting the Spravato.   She is not better so far.  Ongoing severe depression anxiety, avoidance, anhedonia, rumination, afraid to be alone. All not typical of herself.   SE constipation she finds unmanageable  Did sleep better with increased quetiapine 600 mg Hs but cannot continue dT constipation.  However dep and anxiety are no better with Spravato or the other treatments except better sleep.  Anxiety ongoing severe with rumination.  05/17/23 appt noted:  Meds: switched to olanzapine 15 mg HS, sertraline 150, alprazolam  Tremor seems worse but H disagrees. Mood continually worse.  She is dependent on H and afraid to be alone.  No sweating. Xanax helped but wears off after a couple of hours.  She is  open to antoher option Tolerating med changes. No improvement except slept a little better last night but still not enough. No Spravato today bc 2 weeks of it didn't help.  Agrees to pursue ECT but hoping med changes will help Crying spells.  07/10/23 appt noted: Great.  Andrea [name] back at work.  Playing golf again.   Med: sertraline 200, propranolol 10 BID, olanzapine 15 pm, clonazepam 1 mg HS Did not require ECT.  All of a sudden something clicked and gradually better .  Doesn't even recognize the person she was with depression.   No negative thoughts.  Laughing again.  More social and talking again.  Happy in the am.  Sleep very well 10-11 hours.  Everything back to normal .  Doesn't need Andrea [name] with her now.  Ok being alone.  Anxiety gone also. SE carb craving and wt gain 10#.      ECT-MADRS    Flowsheet Row Office Visit from 04/13/2023 in [facility] Visit from 11/03/2022 in [facility]  MADRS Total Score 45 40      GAD-7    Flowsheet Row Office Visit from 05/02/2023 in [facility]  Total GAD-7 Score 21      PHQ2-9    Flowsheet Row Office Visit from 05/02/2023 in [facility] Clinical Support from 02/24/2022 in [facility] Video Visit from 06/14/2021 in [facility] [place]  PHQ-2 Total Score 6 0 0  PHQ-9 Total Score 24 -- 0      Flowsheet Row ED from 01/31/2023 in [facility] at [facility]  ED to Hosp-Admission (Discharged) from 01/02/2023 in [facility] from 12/03/2022 in [facility] at [facility]   C-SSRS RISK CATEGORY No Risk No Risk No Risk      Past Psychiatric Medication Trials: Sertraline, fluoxetine, citalopram 50, nefazodone,  Lexapro 30 weight gain,  Viibryd 30, Wellbutrin 300 , Duloxetine 90 SE tremor, mirtazapine,  Trintellix 20 little response Auvelity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

## 2023-07-10 NOTE — Telephone Encounter (Signed)
Has an appt today

## 2023-07-19 ENCOUNTER — Other Ambulatory Visit: Payer: Self-pay | Admitting: Psychiatry

## 2023-07-19 DIAGNOSIS — F411 Generalized anxiety disorder: Secondary | ICD-10-CM

## 2023-07-19 DIAGNOSIS — R251 Tremor, unspecified: Secondary | ICD-10-CM

## 2023-07-19 NOTE — Telephone Encounter (Signed)
Added DX F41.1

## 2023-07-23 DIAGNOSIS — K642 Third degree hemorrhoids: Secondary | ICD-10-CM | POA: Diagnosis not present

## 2023-08-06 ENCOUNTER — Other Ambulatory Visit: Payer: Self-pay

## 2023-08-06 ENCOUNTER — Telehealth: Payer: Self-pay | Admitting: Psychiatry

## 2023-08-06 NOTE — Telephone Encounter (Signed)
Pended due to the uncertainty of the sig.

## 2023-08-06 NOTE — Telephone Encounter (Signed)
LVM to r/c.

## 2023-08-06 NOTE — Telephone Encounter (Signed)
I gave her samples of Lybalvi 10-10 in place of olanzapine to hlep reduce her wt gain.  Insurance won't cover the Lybalvi off label for depression.  She has an appt with me Friday.  She can either come pick up samples Lybalvi (make sure we have some)  or I can send in RX olanzapine 10 mg  to get her until the appt.  I would give her #30, no refills.

## 2023-08-06 NOTE — Telephone Encounter (Signed)
Pt called asking for a refill on lybalvi 10 mg. She was given samples and she only has two left. She sees dr. Jennelle Human on Friday. Pharmacy is [facility] [address] in [place]

## 2023-08-08 ENCOUNTER — Other Ambulatory Visit (HOSPITAL_COMMUNITY): Payer: Self-pay | Admitting: Gastroenterology

## 2023-08-08 DIAGNOSIS — R140 Abdominal distension (gaseous): Secondary | ICD-10-CM

## 2023-08-08 DIAGNOSIS — R1084 Generalized abdominal pain: Secondary | ICD-10-CM | POA: Diagnosis not present

## 2023-08-08 DIAGNOSIS — K642 Third degree hemorrhoids: Secondary | ICD-10-CM | POA: Diagnosis not present

## 2023-08-10 ENCOUNTER — Encounter: Payer: Self-pay | Admitting: Psychiatry

## 2023-08-10 ENCOUNTER — Ambulatory Visit: Payer: Medicare Other | Admitting: Psychiatry

## 2023-08-10 DIAGNOSIS — F411 Generalized anxiety disorder: Secondary | ICD-10-CM

## 2023-08-10 DIAGNOSIS — F5105 Insomnia due to other mental disorder: Secondary | ICD-10-CM | POA: Diagnosis not present

## 2023-08-10 DIAGNOSIS — F339 Major depressive disorder, recurrent, unspecified: Secondary | ICD-10-CM

## 2023-08-10 MED ORDER — SERTRALINE HCL 100 MG PO TABS: 200.0000 mg | ORAL_TABLET | Freq: Every day | ORAL | 0 refills | Status: DC

## 2023-08-10 MED ORDER — OLANZAPINE 10 MG PO TABS: 10.0000 mg | ORAL_TABLET | Freq: Every day | ORAL | 0 refills | Status: DC

## 2023-08-10 NOTE — Patient Instructions (Signed)
Stop Lybalvi Resume olanzapine 10 mg tablet 3 hours before bedtime

## 2023-08-10 NOTE — Telephone Encounter (Signed)
.  dc

## 2023-08-10 NOTE — Progress Notes (Signed)
BRENDALIS [name] [id number] Oct 13, 1950 72 y.o.   Subjective:   Patient ID:  Andrea [name] is a 72 y.o. (DOB 04/28/1951) female.  Chief Complaint:  Chief Complaint  Patient presents with   Follow-up   Depression   Anxiety    HPI Andrea [name] presents to the office today for follow-up of MDE and anxiety disorder.  seen 09/2019.  No meds were changed.  Been on Lexapro 20 since early 2017.  02/27/2020 phone call from patient: Pt experiencing more anxiety than she normally has. Pt would like to know what are some options that she has as far as her medication. Pt will be in an appt  MD response: If the anxiety is just occasional then using alprazolam as needed would be an okay thing to do.  If it is been fairly persistent for a couple of weeks or more than the simplest solution would be to increase the Lexapro to 1-1/2 tablets daily.  Historically she has done well on Lexapro 20 mg daily for an extended period of time.  If she is under temporary stress then once that is resolved we could drop it back to 1 daily.  If this does not work as expected then she should schedule an earlier appointment. Pt response: Patient called back and she's been having temporary stress, but it's not bad. Things are great, she's working out and feeling okay for the most part. It's been going on for a bit so she agrees to the increase Lexapro 20 mg 1.5 tablets daily. She has been taking alprazolam at hs occasionally to help sleep better and it does help.   12/06/2020 appointment with the following noted:  No covid.  H Covid and recovered.   Had increased Lexapro to 30 mg for 90 days and saw a difference but started seeing weight gain and was doing oK and able to drop back and been OK. Still renovating a house for a year.  Sold her house and mourns the house. Calmed down.  Kept 3 gkids and dogs 10 days.   Andrea [name] Hopes for new house May 1. Rare alprazolam for HS. Stress moving to condo and would have crying spell over the move  and some problems she was running into dealing with it.  Hard with change.  But taking time to adjust to the idea of moving from her house of 23 years.  Initially refused but he left it up to her and she's come to peace about it.  Andrea [name].   Good response to medication and no SE.  Exercising is good medicine.  If doesn't then doesn't feel as good. Satisfied. Plan: no med changes  08/01/21  appt noted:  seen with [name] died 2023-08-20.  A lot of ups and downs per D.  Hard dips. Got worse when retired and dealing with the new  house.  Had trouble getting rid of things from the old house causing regrets over sellling the house.  D notes doesn't do well with instability  and stressors.  Ruminates on past mistakes. Blamed [name] for the  difficulty renovating the house.  Neg self talk and beats herself up. When not enough sleep will get depressed.  Does better if busy.   Dreads things.  Patient denies difficulty with sleep initiation or maintenance. Denies appetite disturbance.  Patient reports that energy and motivation have been good. Patient denies any difficulty with concentration.  Patient denies any suicidal ideation.  Plan: Abilify 2.5 mg  daily for a week, then 5 mg daily. Continue Lexapro 20 mg daily  08/31/2021 appt noted: It worked immediately.  Insomnia with EMA. Normally 9 hours.  It's like I'm high.  Not tired.  Average 3-4 hours. Not depressed and no crazy negative thoughts.  Good response.  Energetic but up in middle of night. Plan: Okay to stop Abilify because of insomnia.   09/29/2021 appointment with the following noted: Parties were great.  Sleep problem resolved.  A lot of rest at the beach. Maybe the last week or 2 less motivation and socialization.  Not laying in the bed.  Just not as good as she was.  Not as outgoing as normal. No alcohol now.  Only had 1-2 at night.  Not ruminating.   Willing to restart Abilify at lower dose 2 mg daily. 3 grandsons  Plan: Relapse  depression recurs she can resume Abilify  but lower dose 2 mg daily or every other day to try to avoid the insomnia where she can contact our office and we can discussed the possibility of an alternative antidepressant such as Trintellix or duloxetine.  12/28/2021 appointment noted: Several phone calls since she was here.  Abilify plus Lexapro failed.  Switch to duloxetine 90 mg which she started 12/02/2018 2023 Started lamotrigine. Also started Rexulti Sister has had cycling depression that has responded well to lamotrigine with poor response to SSRIs [name] with a little dementia. SE tremor 90 mg duloxetine, ? Wt gain over a few weeks.. Exercise and wt control Social isolation, low self esteem, worry about the way she looks and not usually that way. Crying better with duloxetine.  More dependent on H.   Anhedonia.  Black hole.  Never this low. No SI Having to use Xanax.   Asks about day treatment and TMS Plan: rec TMS Continue lamotrigine as prescribed Increase Rexulti to 1 mg daily Switch to Trintellix:  reduce duloxetine to 2 of the 30 mg capsules and start Trintellix 5 mg daily for 5 days,  Then Increase Trintellix to 10 mg daily and reduce duloxetine to 1 capsule for 1 week. Then stop duloxetine.  02/15/22 appt noted: On Trintellix , Rexulti 1 and lamotrigine I feel good.  Walks 3 miles daily is great for mental health. TMS would not be covered by [facility]. Started feeling better after a week or 2 after the last visit. SE appetite increased. No nausea. Not crying anymore. Sleeping well and no longer ruminates. Plan: Continue Trintellix but DC Rexulti due to weight gain.  Continue lamotrigine  03/06/2022 appointment with the following noted: Last 2 weeks has been very sad and nervous. Trintellix was increased to 20 mg daily  03/10/2022 complaining of ongoing depression, feeling jittery, negative thinking, early morning awakening, ruminating about past decisions.  Wanting to consider TMS  because she is desperate for improvement. Plan we will schedule urgent appointment  03/15/22 urgent appt noted:  seen with Andrea [name] Very depressed, worst ever.  Don't want to be alone, ongoing worry, ruminating on past decisions.  Racing negative thoughts.  No motivation.  Trouble staying asleep.  Getting 4 hours and used to 9 hours. Dread getting up. Using Xanax Increased Trintellix to 20 mg 9 days ago. H agrees sleep is critical. Isolating.   Need to be better by July 11. Plan: Clonazepam 1 mg HS. Continue trintellix 20 mg daily she is only been on this dose 9 days and it needs more time. Re lamotrigine and increase to 150 mg daily.Marland Kitchen  Vraylar 1.5 mg every other day Option TMS  03/24/22 appt noted: So much better.  Thinking straighter and not negative and not sad.  Sleeping better 8-9 hours.. No crying. H notices progressively better every day.  Mind clarity is much better.   No SE noted.  No hangover.  No nausea. Found a therapist, Krystal Eaton.   Feels well enough to go to [place] and kind of excited.  Back July 16.   Plan: Clonazepam 1 mg HS. This resolved insomnia without SE Continue trintellix 20 mg daily she is only been on this dose 20 days and it needs more time. Re lamotriginecontinue 150 mg daily.Leafy Kindle 1.5 mg every other day Option TMS  04/12/22 appt noted: Great.  Went to [place].  Was herself and enjoyed it.  Anxiety was managed.   [facility] and liked her. 3# wt gain.   Patient reports stable mood and denies depressed or irritable moods.  Patient denies any recent difficulty with anxiety.  Patient denies difficulty with sleep initiation or maintenance. Denies appetite disturbance.  Patient reports that energy and motivation have been good.  Patient denies any difficulty with concentration.  Patient denies any suicidal ideation. Sleeping well than not anxious. Plan: Trintellix 20  07/10/22 TC depression returned recently.  08/15/22 appt  noted: Still blunted  and diminished motivation.  Some days better than others.  Going to gym.  Partially better.   Still has a lot of anxiety and doesn't want to be alone. Functioning but not normal.  Dreads parties.   SE tremor for a couple of week.sleep great.  Dep 7/10. Current Vraylar 1.5 mg daily, increase lamotrigine 200 mg daily, Trintellix 20 , clonazepam 0.5 mg HS Plan: Reduce Clonazepam 1/2  mg HS. This resolved insomnia without SE Continue trintellix 20 mg daily only mildly effective. lamotrigine continue 200 mg daily.Jerrilyn Cairo DT lost response Auvelity Stop Vraylar After Thanksgiving stop Trintellix Wait 3 days then [employer] 1 in the morning for 1 week,  and if no side effects then increase Auvelity to 1 in the AM and 1 in the PM  09/04/22 TC: Complaining of persistent depression and wanting to do something else to help.  Added lithium CR 300 mg nightly  09/11/2022 phone call complaining of no improvement with Auvelity and lithium.  Appointment was moved up.  09/13/22 appt noted: Nothing changed. No better and no worse.  Awakens sad and tearful.  Sleeps well. Pushing herself to the gym.  Has a trainer. On clonazepam 0.5 mg HS She remains anxious when alone for no apparent reason.  Feelings of inferiority.  She is normally extroverted but now she is wanting to isolate from people.  All tasks seem monumental.  No enjoyment and not looking forward to anything.  Everything is a Personal assistant.  She remains generally worried and anxious which is not typical for her. Plan: Clonazepam 1/2  mg HS. This resolved insomnia without SE Stop Auvelity Start nortriptyline 1 of the 25 mg capsules at night for 4 nights then 2 at night for 4 nights then 3 at night, then wait 1 week and get the blood test in the morning at [facility] Reduce lamotrigine to 1 and 1/2 tablets daily  Lamotrigine has not been helpful to him 100 mg daily.  Start reducing lamotrigine 150 mg daily and will continue to  taper at next appointment.  09/27/21 urgent appt :  she wanted urgent appt bc desperate to  feel better.  Seen with H Called at least twice since here for the same reasons of depression. Not sleeping well even with clonazepam 1 mg HS. Doesn't want to be alone. Shakey.   On lithium 300 mg daily, nortriptyline 75 mg HS.   Doesn't want to live like this but not acutely suicidal. Tolerating meds. Plan: pursue [place] protocol TMS at [facility] bc faster optin than alternatives  10/30/22 TC: finished 50 TMS tx in the week and wants appt ASAP bc still struggling with crying and depresssion..  11/03/22 urgent appt : Completed TMS last week. Tue-Thur better days and then Friday nose-dived. Not done well since got back.   H notes high anxiety and crying in AM and PM and doesn't want to be alone.  Xanxax seems to help. Xanax helps and taking 0.5 mg AM and 1 mg HS H notes memory is sharper and she's more alert after treatment. Very anxioius all the time.  Get up sad.  Exercising. Sleep good with Xanax 1 mg HS. Plan: Increase Lexapro 20 mg dailly (should help crying and maybe anxiety but unlikely to resolve depression) Increase alprazolam to 0.5 mg TID and 1 mg HS for severe anxiety. H notes it helps. Pramipexole 0.25 BID for 5 days and if NR then 0.5 mg Bid off label. Reduce lamotrigine 100 mg for 2 weeks then 50 mg daily for 2 weeks then stop it. She wants to pursue Spravato if pramipexole fails  11/17/22 emergency work in appt: Pramipexole was not sent in and MD not aware until yesterday.  She is not any better and is desperate for a med change. Sleeping well and taking Xanax 1 mg HS Mornings are worse.  Not crying much.  Last Friday was a bad day and she didn't want to be alone.  But did let her H play golf once this week. Reduced lamotrigine today to 50 BID and weaning off. H says better this week than last week. Increased Lexapro to 20 mg daily. Plan: Increased Lexapro 20 mg dailly (helped crying  and maybe anxiety but unlikely to resolve depression) Continue alprazolam to 0.5 mg TID prn anxiety and 1 mg HS. H notes it helps. Pramipexole 0.25 mg BID for 3 days then 0.5 mg BID Reduce lamotrigine 50 mg daily for 2 weeks then stop it. She wants to pursue Spravato if pramipexole fails  12/25/22 appt urgently. COMPLETED 1 WEEK TMS [facility] without benefit except TMS took away brain fog.   She feels need to take Xanax 0.5 mg TID  Some sleepiness with Xanax . Mornings are better and fine while at the gym but when home gets depressed again.   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 Needs Xanax to do social things. Plan: Increased Lexapro 20 mg dailly (helped crying and maybe anxiety but unlikely to resolve depression) Continue alprazolam to 0.5 mg TID prn anxiety and 1 mg HS. H notes it helps. Change pramipexole 0.5mg  tablets, 1 tablet four times daily for 1 week,  Then if not better 1and 1/2 tablets in the AM and dinner and 1 tablet at lunch and dinner.    4/9-4/15 hosp for ruptured appendix and missed meds: Can restart Lexapro at 1/2 tablet daily for 3 days then 1 daily. Restart 1/2 pramipexole tablet 3 times daily for 3 days then 1 tablet 3 times daily for 3 days then 1 and 1/2 tablet 3 times daily.      01/12/23 appt noted: No GI sx now.  No appetite but  working on protein.   I think I'm doing real well mentally.  Has resumed meds.  Mood has been great. Pramipexole 0.5mg   1 and 1/2 TID Lexapro 20 Xanax reduced to 1 mg HS H sees a difference.   No SE with meds.   No med changes  01/25/23 appt noted: Meds:  pramipexole 0.75 mg TID, Xanax 1.5 mg HS and 0.5 mg prn, Lexapro 20 mg daily. Doing great.  Staples removed and healing from surgery appendectomy. Back in the gym helps her.   Satisfied with meds. Getting out socially and feeling like her old self.  Chelsea's H Brad went to ER vomiting blood.   Plan: no med changes  02/07/23 appt noted: Back from [facility] yesterday.  Had ear infection.   Then conjunctivitis.  Eyes hurt.   Before the surgery was getting better and feels like she is sliding back some.  Gradually a little worse since here.  Wonders if she could get better with joy and social drive.   Off Abx .  Sleep is good with meds.  With depression then gets anxious too.  Is functioning.   Meds:  pramipexole 0.75 mg TID, Xanax 1.5 mg HS and 0.5 mg prn, Lexapro 20 mg daily. Consistent and no SE. Plan: Continue Lexapro 20 mg dailly (helped crying and maybe anxiety but unlikely to resolve depression) Continue alprazolam  1 mg HS. H notes it helps. Increase pramipexole 0.5mg  tablets, 2 tablets 3 times daily  01/31/23 TC:Franchot Erichsen, CMA  to Me     02/20/23  4:56 PM Note Patient reporting she is not feeling as good as she thinks she should. Her anxiety is the biggest concern. She said she can go to the gym in the mornings and does ok, but she had a closing to go to recently and had to take 1/4 of a Xanax, reports it took the edge off. She reports daily tremors that are helped by Xanax. She is not crying, she is getting things done, sleeping okay. Will have her grandchildren next week and she isn't excited about it like she should be.    Taking 6 mirapex, 1.5 Xanax and Lexapro 20 mg.     MD resp:  CC   02/20/23  5:48 PM Note We just increased the pramipexole recently and I see her next week.  I do not want to increase the medicine any further until I see her again.      03/02/23 urgent appt noted: Some improvement with incr pramipexole 1 mg TID. Before felt worse in AM now feels better in AM and goes to gym. Otherwise no excitement.  Hard to make decisions. Less social and smiling.  Subdued more than normal.  Not confident.  Not enjoying present and ruminates in past.   No SE.  No impulsivity Has tremor for awhile .  Is mild for a month. Hard to conc.  Brain fog was better with TMS but is back. Continue Lexapro 20 mg dailly (helped crying and maybe anxiety but unlikely  to resolve depression) Continue alprazolam  1 mg HS. H notes it helps. Increase pramipexole 1 mg QID times daily  03/16/23 appt noted: Meds as above SE: If not enough food makes her excitable, urgent, talking fast.  Does it a bit too much.  Energy fine.   Feels better in the AM at the gym and when home a dark shadow comes over her.   Still feels dep overall.  Went to work with friends and  didn't feel natural and didn't enjoy it like she should.  Pushes herself to stay active.   Takes 1.5 mg Xanax HS and then awakens and takes another 0.5 mg HS but gets enough sleep. A lot of brain fog.  Feels inferior and doesn't usually feel that way. Infrequent crying spells. Plan:  Continue Lexapro 20 mg dailly (helped crying and maybe anxiety but unlikely to resolve depression) Continue alprazolam  1 mg HS. H notes it helps. Reduce pramipexole 1 mg TID for 3 days, then 0.5 mg QID for 3 days, then 0.5 mg BID for 3 days, then 0.25 mg BID for 3 days, then stop it. Now start Latuda (lurasidone) 40 mg 1/2 tablet for 1 week.  If not benefit then 1 tablet daily with 350 calories  04/13/23 urgent appt note: with [facility] 04/05/23 for dep with SI "Worst I've ever been".  Feels out of control.  Shaking .  Can't sleep.  Feels disconnected.   Hated being hosp bc locked up.   Taking quetiapine 100 hS and lorazepam and still can't sleep. Started sertraline 50 and stopped Lexapro. Quetiapine 100 mg HS Plan: Stop hydroxyzine Increase quetiapine 200 then 300 mg at night for sleep and depression  Stop lorazepam and return to alprazolam 1 mg at night for sleep Increase sertraline to 100 mg daily or 2 of 50 mg nightly  04/23/23 appt noted: Here for first appt with Spravato.  Highly anxious about getting Spravato today.  Doesn't like feeling out of control and worries over this. Still very depressed.  Doesn't think her sx as noted are due to depression.  Thinks she has a neuro problem.  But she has no other neuro sx like  numbness, weakness, balance px, or anything other than dep and anxiety sx. Received Spravato 56 mg today.  "It was horrible!" Bc she felt out of control and was anxious receiving it.  Thought it would last forever re: dissociation.  It was scary to her.  Couldn't relax for it.  No N, V, HA.  Ambivalent about continuing Spravato but open. No specific concerns with meds.    04/25/23 appt noted: Needed Xanax before visit today to overcome fear of coming to the appt. She was more disciplined about controlling neg thought during Spravato admin and had a much better experience.  It was not scary nor associated with sig fear.  She is still dep and anxious without change otherwise since seen a couple of days ago. Tolerating med changes from last week.   Received Spravato 56 mg again and tolerated it without adverse SE.  Typical SE dissociation, no NV, palpitations, HA.  Motivated to continue the Spravato and agrees to increase it.  Better experience than the first time. Plan: Increase quetiapine to 400 mg HS  04/26/23 TC:  Patient taking 300 mg of Seroquel and 1 mg of Xanax for sleep. She can get to sleep, just can't stay asleep. Doesn't say how long she is sleeping, but says she needs 9 hours of sleep. Reporting social isolation is bad, she won't leave the house, but doesn't like being home alone.  Reports she has never been this sick mentally and feels like she may need to go to the hospital. No SI, just doesn't like being alone.     MD resp:  Pt just seen yesterday and received 2nd Spravato 56 mg .  That was positive experience. However she is still severely depressed and ruminating and reassurance seeking..  She doesn't need to  go to the hospital. She told me yesterday she slept 7 hours but needs 9 hours.  7 hours is not bad.   She is taking quetiapne 300 mg HS with Xanax 1 mg HS.  No increase in Xanax but can increase quetiapine to 400 mg HS (max dose 800) and this could help sleep and anxiety and  depression. She should keep appt next week for next Spravato.  In case she asks, Spravato is not making her worse in any way.     05/01/23 appt noted;H present also Current meds; quetiapine 400 mg HS, sertraline 100 mg daily, alprazolam 0.5 mg TID and 1 mg HS. Tolerating med changes.    Received Spravato 84 mg first time.  Typical SE dissociation, no NV, palpitations, HA.  However she had a bad experience DT severe anxiety before starting the Spravato.   Very fearful, "scared".  Without specific reasons.  Severe anxiety dep, hopeless.  No SI but doesn't want to live like this.  So anxious hard to sleep and some crying spells. Seroquel not helping sleep.  05/03/23 appt noted: Current meds; quetiapine 400 mg HS, sertraline 100 mg daily, alprazolam 0.5 mg TID and 1 mg HS. Tolerating med changes.    Received Spravato 84 mg.  Typical SE dissociation, no NV, palpitations, HA.  However she had a bad experience DT severe anxiety before starting the Spravato.   Highly anxious, fearful of "everything" including Spravato admin.  Afraid she will forget who she is despite having it before.  Still anxious and trouble sleeping at home.  Sleep no better with quetiapine and still ruminatiing.   Plan increase quetiapine to 500 mg HS  05/08/23 appt noted: Meds: quetiapine 500 mg HS, sertraline 100 mg daily, alprazolam 0.5 mg TID and 1 mg HS. Received Spravato 84 mg 3rd time.  Typical SE dissociation, no NV, palpitations, HA.  However she had a bad experience DT severe anxiety before starting the Spravato.   She is not better so far.  Ongoing severe depression anxiety, avoidance, anhedonia, rumination, afraid to be alone. All not typical of herself.   No SE with meds except constipation and sweating at night.  Plan:  Increase quetiapine to 600 mg at night for sleep and depression & rumination and next week to 600 HS increased alprazolam 1 mg at night for sleep and 0.5 mg TID Increase sertraline 150 mg daily   Spravato 84 mg AM twice weekly for severe TRD  05/10/23 appt noted:  Meds: quetiapine 600 mg HS, sertraline 150 mg daily, alprazolam 0.5 mg TID and 1 mg HS. Received Spravato 84 mg 4th time.  Typical SE dissociation, no NV, palpitations, HA.  However she had a bad experience DT severe anxiety before starting the Spravato.   She is not better so far.  Ongoing severe depression anxiety, avoidance, anhedonia, rumination, afraid to be alone. All not typical of herself.   SE constipation she finds unmanageable  Did sleep better with increased quetiapine 600 mg Hs but cannot continue dT constipation.  However dep and anxiety are no better with Spravato or the other treatments except better sleep.  Anxiety ongoing severe with rumination.  05/17/23 appt noted:  Meds: switched to olanzapine 15 mg HS, sertraline 150, alprazolam  Tremor seems worse but H disagrees. Mood continually worse.  She is dependent on H and afraid to be alone.  No sweating. Xanax helped but wears off after a couple of hours.  She is open to antoher option Tolerating med  changes. No improvement except slept a little better last night but still not enough. No Spravato today bc 2 weeks of it didn't help.  Agrees to pursue ECT but hoping med changes will help Crying spells.  07/10/23 appt noted: Great.  [name] back at work.  Playing golf again.   Med: sertraline 200, propranolol 10 BID, olanzapine 15 pm, clonazepam 1 mg HS Did not require ECT.  All of a sudden something clicked and gradually better .  Doesn't even recognize the person she was with depression.   No negative thoughts.  Laughing again.  More social and talking again.  Happy in the am.  Sleep very well 10-11 hours.  Everything back to normal .  Doesn't need [name] with her now.  Ok being alone.  Anxiety gone also. SE carb craving and wt gain 10#.   Plan: Propranolol 10 mg tablet 2-3 tablets twice daily as needed for tremor Continue clonazepam 1 mg tablet 1/2 twice  daily and 1 tablet at night Continue sertraline 200 tablets daily. Switch olanzapine 15 to Lybalvi 10 DT wt gain.    08/10/23 appt noted: Psych med: sertraline 200, propranolol 10 BID, Lybalvi 10 pm, clonazepam 1 mg HS Up to 141# with Lybalvi.  Went to wt reducing clinic and got shot semaglutide.  $75/ week.  That took appetite away.   Was a little more down after a couple of missed doses of Lybalvi. Mood had been great  until the missed dose.  Sleeping a lot.  No problems with change from olanzapine 15 to Lybalvi 10-10 daily but no benefit with appetite reduction.   Party tomorrow night.  Got house decorated yesterday.   Since appendectomy swollen though abdomen.  Abd protruding.    ECT-MADRS    Flowsheet Row Office Visit from 04/13/2023 in [facility] Office Visit from 11/03/2022 in [facility]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

## 2023-08-20 ENCOUNTER — Ambulatory Visit (HOSPITAL_COMMUNITY)
Admission: RE | Admit: 2023-08-20 | Discharge: 2023-08-20 | Disposition: A | Discharge status: Home / Self Care | Payer: Medicare Other | Source: Ambulatory Visit | Attending: Gastroenterology | Admitting: Gastroenterology

## 2023-08-20 ENCOUNTER — Ambulatory Visit
Admission: EM | Admit: 2023-08-20 | Discharge: 2023-08-20 | Disposition: A | Discharge status: Home / Self Care | Payer: Medicare Other | Attending: Family Medicine | Admitting: Family Medicine

## 2023-08-20 DIAGNOSIS — R140 Abdominal distension (gaseous): Secondary | ICD-10-CM | POA: Diagnosis not present

## 2023-08-20 DIAGNOSIS — K5730 Diverticulosis of large intestine without perforation or abscess without bleeding: Secondary | ICD-10-CM | POA: Diagnosis not present

## 2023-08-20 DIAGNOSIS — K828 Other specified diseases of gallbladder: Secondary | ICD-10-CM | POA: Diagnosis not present

## 2023-08-20 DIAGNOSIS — R350 Frequency of micturition: Secondary | ICD-10-CM

## 2023-08-20 DIAGNOSIS — K429 Umbilical hernia without obstruction or gangrene: Secondary | ICD-10-CM | POA: Diagnosis not present

## 2023-08-20 DIAGNOSIS — J0100 Acute maxillary sinusitis, unspecified: Secondary | ICD-10-CM | POA: Diagnosis not present

## 2023-08-20 LAB — POCT URINALYSIS DIP (MANUAL ENTRY)
Bilirubin, UA: NEGATIVE
Blood, UA: NEGATIVE
Glucose, UA: NEGATIVE mg/dL
Ketones, POC UA: NEGATIVE mg/dL
Leukocytes, UA: NEGATIVE
Nitrite, UA: NEGATIVE
Protein Ur, POC: NEGATIVE mg/dL
Spec Grav, UA: <=1.005 — AB (ref 1.010–1.025)
Urobilinogen, UA: 0.2 U/dL
pH, UA: 5.5 (ref 5.0–8.0)

## 2023-08-20 MED ORDER — AZITHROMYCIN 250 MG PO TABS: ORAL_TABLET | ORAL | 0 refills | Status: DC

## 2023-08-20 MED ORDER — IOHEXOL 350 MG/ML SOLN
60.0000 mL | Freq: Once | INTRAVENOUS | Status: AC | PRN
  Administered 2023-08-20: 60 mL via INTRAVENOUS
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
## 2023-08-20 NOTE — Discharge Instructions (Signed)
No UTI today. Treating for sinus infection. Take medication as prescribed. Follow-up with PCP as needed

## 2023-08-21 ENCOUNTER — Telehealth: Payer: Self-pay | Admitting: Psychiatry

## 2023-08-21 ENCOUNTER — Other Ambulatory Visit: Payer: Self-pay

## 2023-08-21 DIAGNOSIS — F5105 Insomnia due to other mental disorder: Secondary | ICD-10-CM

## 2023-08-21 DIAGNOSIS — F411 Generalized anxiety disorder: Secondary | ICD-10-CM

## 2023-08-21 MED ORDER — CLONAZEPAM 1 MG PO TABS: ORAL_TABLET | ORAL | 1 refills | Status: DC

## 2023-08-21 NOTE — Telephone Encounter (Signed)
Pended ClonaZEPAM 1 MG to requested pharmacy

## 2023-08-21 NOTE — Telephone Encounter (Signed)
Next visit is 09/27/23. Requesting refill on Clonazepam 1 mg called to:  [id number] [place], [place] - [address]   Phone: [phone]  Fax: [phone]

## 2023-08-27 ENCOUNTER — Telehealth: Payer: Self-pay | Admitting: Psychiatry

## 2023-08-27 ENCOUNTER — Ambulatory Visit: Payer: Medicare Other | Admitting: Psychiatry

## 2023-08-27 NOTE — Telephone Encounter (Signed)
Andrea [name] called at 11:30 to report that she is not feeling as well as she should and would like to discuss medication adjustment.  Please call to discuss.  Next appt 1/2

## 2023-08-27 NOTE — Telephone Encounter (Signed)
Andrea [name] reports that she has been sleeping a lot. She worked out today, she ran 2 miles and worked out for an hour. She feels like depression is hanging over her head. She wants to know if there is something that can be changed with her medication. She is lacking motivation and she is not looking forward to anything. She states that this has been going on for about 2 weeks. She denies anything new happening. She mentions that she was fine around the holiday with friends and family. She is not where she was her last visit.  Please advise.

## 2023-08-28 NOTE — Telephone Encounter (Signed)
Her mood was better when she was on 15 mg olanzapine and has gotten worse since we reduced it to 10 mg daily.  Therefore increase olanzapine back to 15 mg PM.

## 2023-08-29 NOTE — Telephone Encounter (Signed)
Notified patient to go back to 15 mg of olanzapine. She said she had a good day yesterday, was at the gym when I called her. She said "I'm not giving up."

## 2023-09-12 ENCOUNTER — Telehealth: Payer: Self-pay | Admitting: Psychiatry

## 2023-09-12 ENCOUNTER — Other Ambulatory Visit: Payer: Self-pay

## 2023-09-12 DIAGNOSIS — F339 Major depressive disorder, recurrent, unspecified: Secondary | ICD-10-CM

## 2023-09-12 MED ORDER — OLANZAPINE 20 MG PO TABS: 20.0000 mg | ORAL_TABLET | Freq: Every day | ORAL | 1 refills | Status: DC

## 2023-09-12 NOTE — Telephone Encounter (Signed)
Pt called to cancel appt on 1/2 because she will be at the beach.  She said she's sleeping good, but still feeling depressed and "blah".  She said Dr Jennelle Human increased her meds and she wonders if they need to be increased more.  She is asking if he can see her on a Friday.  No upcoming appt scheduled.

## 2023-09-12 NOTE — Telephone Encounter (Signed)
Pt notified. She asked that we go ahead and send in the rx for Zyprexa 20 mg (sent to requested pharmacy).   Please add pt to wait list.

## 2023-09-12 NOTE — Telephone Encounter (Signed)
Andrea [name] says she's still feeling depressed. Olanzapine was increased on 12/3 back to 15 mg from 10 mg. she wants to know if another increase is needed and wants to know if she can be seen on Friday.      08/28/23  6:26 PM Note Her mood was better when she was on 15 mg olanzapine and has gotten worse since we reduced it to 10 mg daily.  Therefore increase olanzapine back to 15 mg PM.

## 2023-09-12 NOTE — Telephone Encounter (Signed)
Tell her I got sick with pneumonia last week and sched for Friday got filled as a result so I can't see her this Friday.   She can increase olanzapine to 20 mg daily for depression.  She probably has some 10 mg tablets left but if needed we can send in the 20 mg tablet.  Have admin put her on waiting list.

## 2023-09-21 ENCOUNTER — Ambulatory Visit (INDEPENDENT_AMBULATORY_CARE_PROVIDER_SITE_OTHER): Payer: Medicare Other

## 2023-09-21 VITALS — Ht 62.5 in | Wt 136.0 lb

## 2023-09-21 DIAGNOSIS — Z0000 Encounter for general adult medical examination without abnormal findings: Secondary | ICD-10-CM

## 2023-09-21 NOTE — Patient Instructions (Signed)
Andrea [name] , Thank you for taking time to come for your Medicare Wellness Visit. I appreciate your ongoing commitment to your health goals. Please review the following plan we discussed and let me know if I can assist you in the future.   Referrals/Orders/Follow-Ups/Clinician Recommendations: none  This is a list of the screening recommended for you and due dates:  Health Maintenance  Topic Date Due   Hepatitis C Screening  Never done   Pneumonia Vaccine (2 of 2 - PCV) 03/03/2018   Flu Shot  04/26/2023   COVID-19 Vaccine (4 - 2024-25 season) 05/27/2023   Mammogram  01/17/2024   Medicare Annual Wellness Visit  09/20/2024   DTaP/Tdap/Td vaccine (2 - Tdap) 06/23/2029   Colon Cancer Screening  10/13/2030   DEXA scan (bone density measurement)  Completed   Zoster (Shingles) Vaccine  Completed   HPV Vaccine  Aged Out    Advanced directives: (Copy Requested) Please bring a copy of your health care power of attorney and living will to the office to be added to your chart at your convenience.  Next Medicare Annual Wellness Visit scheduled for next year: Yes 09/23/24 @ 2:20pm televisit
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
## 2023-09-26 DIAGNOSIS — R251 Tremor, unspecified: Secondary | ICD-10-CM

## 2023-09-26 HISTORY — DX: Tremor, unspecified: R25.1

## 2023-09-27 ENCOUNTER — Ambulatory Visit: Payer: Medicare Other | Admitting: Psychiatry

## 2023-09-27 NOTE — Telephone Encounter (Signed)
 Pt has been added to the wait list.

## 2023-10-01 ENCOUNTER — Telehealth: Payer: Self-pay | Admitting: Psychiatry

## 2023-10-01 NOTE — Telephone Encounter (Signed)
 I've done as much as I can do until her appt.  But I agree that the extra olanzapine has not helped.  Tell her to cut the olanzapine back to 10 mg in the evening.  Further med changes will need to be made at the appt.

## 2023-10-01 NOTE — Telephone Encounter (Signed)
 PT lvm that she would like to talk to a nurse about her medications. Please call her at [phone]

## 2023-10-01 NOTE — Telephone Encounter (Signed)
 Andrea [name] states that she's just blah, kind of feels good then she feels blah again. It's been going on about two weeks She does not recall any triggers that made this change. She states she's sleeping well; she gets about 10 hours a night. She says that when she wakes up, she feels down. Her appetite is fine.  She can complete chores and hygiene tasks. She takes Olanzapine  20 mg 3 hours before bed, and Clonazepam  1mg  1 hour before bed. She said she does not see a lot of difference from the Olanzapine  10 mg and the 20mg . She would like to know any further recommendations.  Please advise.

## 2023-10-04 ENCOUNTER — Encounter: Payer: Self-pay | Admitting: Psychiatry

## 2023-10-04 ENCOUNTER — Ambulatory Visit: Payer: Medicare Other | Admitting: Psychiatry

## 2023-10-04 ENCOUNTER — Other Ambulatory Visit: Payer: Self-pay | Admitting: Psychiatry

## 2023-10-04 DIAGNOSIS — R251 Tremor, unspecified: Secondary | ICD-10-CM

## 2023-10-04 DIAGNOSIS — F5105 Insomnia due to other mental disorder: Secondary | ICD-10-CM | POA: Diagnosis not present

## 2023-10-04 DIAGNOSIS — F339 Major depressive disorder, recurrent, unspecified: Secondary | ICD-10-CM

## 2023-10-04 DIAGNOSIS — F411 Generalized anxiety disorder: Secondary | ICD-10-CM

## 2023-10-04 MED ORDER — METHYLPHENIDATE HCL 10 MG PO TABS: ORAL_TABLET | ORAL | 0 refills | Status: DC

## 2023-10-04 MED ORDER — OLANZAPINE 10 MG PO TABS: 10.0000 mg | ORAL_TABLET | Freq: Every day | ORAL | 0 refills | Status: DC

## 2023-10-04 NOTE — Progress Notes (Signed)
 Andrea [name] [id number] 12/31/1950 73 y.o.   Subjective:   Patient ID:  Andrea [name] is a 73 y.o. (DOB [birth year]) female.  Chief Complaint:  Chief Complaint  Patient presents with   Follow-up   Depression   Anxiety   Medication Reaction   Fatigue    HPI Andrea [name] presents to the office today for follow-up of MDE and anxiety disorder.  seen 09/2019.  No meds were changed.  Been on Lexapro  20 since early 2017.  02/27/2020 phone call from patient: Pt experiencing more anxiety than she normally has. Pt would like to know what are some options that she has as far as her medication. Pt will be in an appt  MD response: If the anxiety is just occasional then using alprazolam  as needed would be an okay thing to do.  If it is been fairly persistent for a couple of weeks or more than the simplest solution would be to increase the Lexapro  to 1-1/2 tablets daily.  Historically she has done well on Lexapro  20 mg daily for an extended period of time.  If she is under temporary stress then once that is resolved we could drop it back to 1 daily.  If this does not work as expected then she should schedule an earlier appointment. Pt response: Patient called back and she's been having temporary stress, but it's not bad. Things are great, she's working out and feeling okay for the most part. It's been going on for a bit so she agrees to the increase Lexapro  20 mg 1.5 tablets daily. She has been taking alprazolam  at hs occasionally to help sleep better and it does help.   12/06/2020 appointment with the following noted:  No covid.  H Covid and recovered.   Had increased Lexapro  to 30 mg for 90 days and saw a difference but started seeing weight gain and was doing oK and able to drop back and been OK. Still renovating a house for a year.  Sold her house and mourns the house. Calmed down.  Kept 3 gkids and dogs 10 days.   VEAR Collum Hopes for new house May 1. Rare alprazolam  for HS. Stress moving to condo and  would have crying spell over the move and some problems she was running into dealing with it.  Hard with change.  But taking time to adjust to the idea of moving from her house of 23 years.  Initially refused but he left it up to her and she's come to peace about it.  VEAR Collum.   Good response to medication and no SE.  Exercising is good medicine.  If doesn't then doesn't feel as good. Satisfied. Plan: no med changes  08/01/21  appt noted:  seen with [name] died Aug 11, 2024.  A lot of ups and downs per D.  Hard dips. Got worse when retired and dealing with the new  house.  Had trouble getting rid of things from the old house causing regrets over sellling the house.  D notes doesn't do well with instability  and stressors.  Ruminates on past mistakes. Blamed Collum for the  difficulty renovating the house.  Neg self talk and beats herself up. When not enough sleep will get depressed.  Does better if busy.   Dreads things.  Patient denies difficulty with sleep initiation or maintenance. Denies appetite disturbance.  Patient reports that energy and motivation have been good. Patient denies any difficulty with concentration.  Patient denies any  suicidal ideation.  Plan: Abilify  2.5 mg daily for a week, then 5 mg daily. Continue Lexapro  20 mg daily  08/31/2021 appt noted: It worked immediately.  Insomnia with EMA. Normally 9 hours.  It's like I'm high.  Not tired.  Average 3-4 hours. Not depressed and no crazy negative thoughts.  Good response.  Energetic but up in middle of night. Plan: Okay to stop Abilify  because of insomnia.   09/29/2021 appointment with the following noted: Parties were great.  Sleep problem resolved.  A lot of rest at the beach. Maybe the last week or 2 less motivation and socialization.  Not laying in the bed.  Just not as good as she was.  Not as outgoing as normal. No alcohol now.  Only had 1-2 at night.  Not ruminating.   Willing to restart Abilify  at lower dose 2 mg  daily. 3 grandsons  Plan: Relapse depression recurs she can resume Abilify   but lower dose 2 mg daily or every other day to try to avoid the insomnia where she can contact our office and we can discussed the possibility of an alternative antidepressant such as Trintellix  or duloxetine .  12/28/2021 appointment noted: Several phone calls since she was here.  Abilify  plus Lexapro  failed.  Switch to duloxetine  90 mg which she started 12/02/2018 2023 Started lamotrigine . Also started Rexulti  Sister has had cycling depression that has responded well to lamotrigine  with poor response to SSRIs [name] with a little dementia. SE tremor 90 mg duloxetine , ? Wt gain over a few weeks.. Exercise and wt control Social isolation, low self esteem, worry about the way she looks and not usually that way. Crying better with duloxetine .  More dependent on H.   Anhedonia.  Black hole.  Never this low. No SI Having to use Xanax .   Asks about day treatment and TMS Plan: rec TMS Continue lamotrigine  as prescribed Increase Rexulti  to 1 mg daily Switch to Trintellix :  reduce duloxetine  to 2 of the 30 mg capsules and start Trintellix  5 mg daily for 5 days,  Then Increase Trintellix  to 10 mg daily and reduce duloxetine  to 1 capsule for 1 week. Then stop duloxetine .  02/15/22 appt noted: On Trintellix  , Rexulti  1 and lamotrigine  I feel good.  Walks 3 miles daily is great for mental health. TMS would not be covered by [facility]. Started feeling better after a week or 2 after the last visit. SE appetite increased. No nausea. Not crying anymore. Sleeping well and no longer ruminates. Plan: Continue Trintellix  but DC Rexulti  due to weight gain.  Continue lamotrigine   03/06/2022 appointment with the following noted: Last 2 weeks has been very sad and nervous. Trintellix  was increased to 20 mg daily  03/10/2022 complaining of ongoing depression, feeling jittery, negative thinking, early morning awakening, ruminating about past  decisions.  Wanting to consider TMS because she is desperate for improvement. Plan we will schedule urgent appointment  03/15/22 urgent appt noted:  seen with VEAR Collum Very depressed, worst ever.  Don't want to be alone, ongoing worry, ruminating on past decisions.  Racing negative thoughts.  No motivation.  Trouble staying asleep.  Getting 4 hours and used to 9 hours. Dread getting up. Using Xanax  Increased Trintellix  to 20 mg 9 days ago. H agrees sleep is critical. Isolating.   Need to be better by July 11. Plan: Clonazepam  1 mg HS. Continue trintellix  20 mg daily she is only been on this dose 9 days and it needs more time. Re  lamotrigine  and increase to 150 mg daily..    Vraylar  1.5 mg every other day Option TMS  03/24/22 appt noted: So much better.  Thinking straighter and not negative and not sad.  Sleeping better 8-9 hours.. No crying. H notices progressively better every day.  Mind clarity is much better.   No SE noted.  No hangover.  No nausea. Found a therapist, Heather Jungling.   Feels well enough to go to [place] and kind of excited.  Back July 16.   Plan: Clonazepam  1 mg HS. This resolved insomnia without SE Continue trintellix  20 mg daily she is only been on this dose 20 days and it needs more time. Re lamotriginecontinue 150 mg daily..    Vraylar  1.5 mg every other day Option TMS  04/12/22 appt noted: Great.  Went to [place].  Was herself and enjoyed it.  Anxiety was managed.   [facility] and liked her. 3# wt gain.   Patient reports stable mood and denies depressed or irritable moods.  Patient denies any recent difficulty with anxiety.  Patient denies difficulty with sleep initiation or maintenance. Denies appetite disturbance.  Patient reports that energy and motivation have been good.  Patient denies any difficulty with concentration.  Patient denies any suicidal ideation. Sleeping well than not anxious. Plan: Trintellix  20  07/10/22 TC depression  returned recently.  08/15/22 appt noted: Still blunted  and diminished motivation.  Some days better than others.  Going to gym.  Partially better.   Still has a lot of anxiety and doesn't want to be alone. Functioning but not normal.  Dreads parties.   SE tremor for a couple of week.sleep great.  Dep 7/10. Current Vraylar  1.5 mg daily, increase lamotrigine  200 mg daily, Trintellix  20 , clonazepam  0.5 mg HS Plan: Reduce Clonazepam  1/2  mg HS. This resolved insomnia without SE Continue trintellix  20 mg daily only mildly effective. lamotrigine  continue 200 mg daily..    DC Vraylar  DT lost response Auvelity  Stop Vraylar  After Thanksgiving stop Trintellix  Wait 3 days then Start Auvelity  1 in the morning for 1 week,  and if no side effects then increase Auvelity  to 1 in the AM and 1 in the PM  09/04/22 TC: Complaining of persistent depression and wanting to do something else to help.  Added lithium  CR 300 mg nightly  09/11/2022 phone call complaining of no improvement with Auvelity  and lithium .  Appointment was moved up.  09/13/22 appt noted: Nothing changed. No better and no worse.  Awakens sad and tearful.  Sleeps well. Pushing herself to the gym.  Has a trainer. On clonazepam  0.5 mg HS She remains anxious when alone for no apparent reason.  Feelings of inferiority.  She is normally extroverted but now she is wanting to isolate from people.  All tasks seem monumental.  No enjoyment and not looking forward to anything.  Everything is a personal assistant.  She remains generally worried and anxious which is not typical for her. Plan: Clonazepam  1/2  mg HS. This resolved insomnia without SE Stop Auvelity  Start nortriptyline  1 of the 25 mg capsules at night for 4 nights then 2 at night for 4 nights then 3 at night, then wait 1 week and get the blood test in the morning at [facility] Reduce lamotrigine  to 1 and 1/2 tablets daily  Lamotrigine  has not been helpful to him 100 mg daily.  Start reducing lamotrigine   150 mg daily and will continue to taper at next appointment.  09/27/21 urgent  appt :  she wanted urgent appt bc desperate to feel better.  Seen with H Called at least twice since here for the same reasons of depression. Not sleeping well even with clonazepam  1 mg HS. Doesn't want to be alone. Shakey.   On lithium  300 mg daily, nortriptyline  75 mg HS.   Doesn't want to live like this but not acutely suicidal. Tolerating meds. Plan: pursue [facility] protocol [facility]MS at [facility] bc faster optin than alternatives  10/30/22 TC: finished 50 TMS tx in the week and wants appt ASAP bc still struggling with crying and depresssion..  11/03/22 urgent appt : Completed TMS last week. Tue-Thur better days and then Friday nose-dived. Not done well since got back.   H notes high anxiety and crying in AM and PM and doesn't want to be alone.  Xanxax seems to help. Xanax  helps and taking 0.5 mg AM and 1 mg HS H notes memory is sharper and she's more alert after treatment. Very anxioius all the time.  Get up sad.  Exercising. Sleep good with Xanax  1 mg HS. Plan: Increase Lexapro  20 mg dailly (should help crying and maybe anxiety but unlikely to resolve depression) Increase alprazolam  to 0.5 mg TID and 1 mg HS for severe anxiety. H notes it helps. Pramipexole  0.25 BID for 5 days and if NR then 0.5 mg Bid off label. Reduce lamotrigine  100 mg for 2 weeks then 50 mg daily for 2 weeks then stop it. She wants to pursue Spravato  if pramipexole  fails  11/17/22 emergency work in appt: Pramipexole  was not sent in and MD not aware until yesterday.  She is not any better and is desperate for a med change. Sleeping well and taking Xanax  1 mg HS Mornings are worse.  Not crying much.  Last Friday was a bad day and she didn't want to be alone.  But did let her H play golf once this week. Reduced lamotrigine  today to 50 BID and weaning off. H says better this week than last week. Increased Lexapro  to 20 mg daily. Plan: Increased  Lexapro  20 mg dailly (helped crying and maybe anxiety but unlikely to resolve depression) Continue alprazolam  to 0.5 mg TID prn anxiety and 1 mg HS. H notes it helps. Pramipexole  0.25 mg BID for 3 days then 0.5 mg BID Reduce lamotrigine  50 mg daily for 2 weeks then stop it. She wants to pursue Spravato  if pramipexole  fails  12/25/22 appt urgently. COMPLETED 1 WEEK TMS [facility] without benefit except TMS took away brain fog.   She feels need to take Xanax  0.5 mg TID  Some sleepiness with Xanax  . Mornings are better and fine while at the gym but when home gets depressed again.   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 Needs Xanax  to do social things. Plan: Increased Lexapro  20 mg dailly (helped crying and maybe anxiety but unlikely to resolve depression) Continue alprazolam  to 0.5 mg TID prn anxiety and 1 mg HS. H notes it helps. Change pramipexole  0.5mg  tablets, 1 tablet four times daily for 1 week,  Then if not better 1and 1/2 tablets in the AM and dinner and 1 tablet at lunch and dinner.    4/9-4/15 hosp for ruptured appendix and missed meds: Can restart Lexapro  at 1/2 tablet daily for 3 days then 1 daily. Restart 1/2 pramipexole  tablet 3 times daily for 3 days then 1 tablet 3 times daily for 3 days then 1 and 1/2 tablet 3 times daily.      01/12/23  appt noted: No GI sx now.  No appetite but working on protein.   I think I'm doing real well mentally.  Has resumed meds.  Mood has been great. Pramipexole  0.5mg   1 and 1/2 TID Lexapro  20 Xanax  reduced to 1 mg HS H sees a difference.   No SE with meds.   No med changes  01/25/23 appt noted: Meds:  pramipexole  0.75 mg TID, Xanax  1.5 mg HS and 0.5 mg prn, Lexapro  20 mg daily. Doing great.  Staples removed and healing from surgery appendectomy. Back in the gym helps her.   Satisfied with meds. Getting out socially and feeling like her old self.  Chelsea's H Brad went to ER vomiting blood.   Plan: no med changes  02/07/23 appt noted: Back from  [facility] yesterday.  Had ear infection.  Then conjunctivitis.  Eyes hurt.   Before the surgery was getting better and feels like she is sliding back some.  Gradually a little worse since here.  Wonders if she could get better with joy and social drive.   Off Abx .  Sleep is good with meds.  With depression then gets anxious too.  Is functioning.   Meds:  pramipexole  0.75 mg TID, Xanax  1.5 mg HS and 0.5 mg prn, Lexapro  20 mg daily. Consistent and no SE. Plan: Continue Lexapro  20 mg dailly (helped crying and maybe anxiety but unlikely to resolve depression) Continue alprazolam   1 mg HS. H notes it helps. Increase pramipexole  0.5mg  tablets, 2 tablets 3 times daily  01/31/23 TC:Con Dawna DASEN, CMA  to Me     02/20/23  4:56 PM Note Patient reporting she is not feeling as good as she thinks she should. Her anxiety is the biggest concern. She said she can go to the gym in the mornings and does ok, but she had a closing to go to recently and had to take 1/4 of a Xanax , reports it took the edge off. She reports daily tremors that are helped by Xanax . She is not crying, she is getting things done, sleeping okay. Will have her grandchildren next week and she isn't excited about it like she should be.    Taking 6 mirapex , 1.5 Xanax  and Lexapro  20 mg.     MD resp:  CC   02/20/23  5:48 PM Note We just increased the pramipexole  recently and I see her next week.  I do not want to increase the medicine any further until I see her again.      03/02/23 urgent appt noted: Some improvement with incr pramipexole  1 mg TID. Before felt worse in AM now feels better in AM and goes to gym. Otherwise no excitement.  Hard to make decisions. Less social and smiling.  Subdued more than normal.  Not confident.  Not enjoying present and ruminates in past.   No SE.  No impulsivity Has tremor for awhile .  Is mild for a month. Hard to conc.  Brain fog was better with TMS but is back. Continue Lexapro  20 mg dailly (helped  crying and maybe anxiety but unlikely to resolve depression) Continue alprazolam   1 mg HS. H notes it helps. Increase pramipexole  1 mg QID times daily  03/16/23 appt noted: Meds as above SE: If not enough food makes her excitable, urgent, talking fast.  Does it a bit too much.  Energy fine.   Feels better in the AM at the gym and when home a dark shadow comes over her.   Still  feels dep overall.  Went to work with friends and didn't feel natural and didn't enjoy it like she should.  Pushes herself to stay active.   Takes 1.5 mg Xanax  HS and then awakens and takes another 0.5 mg HS but gets enough sleep. A lot of brain fog.  Feels inferior and doesn't usually feel that way. Infrequent crying spells. Plan:  Continue Lexapro  20 mg dailly (helped crying and maybe anxiety but unlikely to resolve depression) Continue alprazolam   1 mg HS. H notes it helps. Reduce pramipexole  1 mg TID for 3 days, then 0.5 mg QID for 3 days, then 0.5 mg BID for 3 days, then 0.25 mg BID for 3 days, then stop it. Now start Latuda  (lurasidone ) 40 mg 1/2 tablet for 1 week.  If not benefit then 1 tablet daily with 350 calories  04/13/23 urgent appt note: with [facility] 04/05/23 for dep with SI Worst I've ever been.  Feels out of control.  Shaking .  Can't sleep.  Feels disconnected.   Hated being hosp bc locked up.   Taking quetiapine  100 hS and lorazepam and still can't sleep. Started sertraline  50 and stopped Lexapro . Quetiapine  100 mg HS Plan: Stop hydroxyzine Increase quetiapine  200 then 300 mg at night for sleep and depression  Stop lorazepam and return to alprazolam  1 mg at night for sleep Increase sertraline  to 100 mg daily or 2 of 50 mg nightly  04/23/23 appt noted: Here for first appt with Spravato .  Highly anxious about getting Spravato  today.  Doesn't like feeling out of control and worries over this. Still very depressed.  Doesn't think her sx as noted are due to depression.  Thinks she has a neuro problem.   But she has no other neuro sx like numbness, weakness, balance px, or anything other than dep and anxiety sx. Received Spravato  56 mg today.  It was horrible! Bc she felt out of control and was anxious receiving it.  Thought it would last forever re: dissociation.  It was scary to her.  Couldn't relax for it.  No N, V, HA.  Ambivalent about continuing Spravato  but open. No specific concerns with meds.    04/25/23 appt noted: Needed Xanax  before visit today to overcome fear of coming to the appt. She was more disciplined about controlling neg thought during Spravato  admin and had a much better experience.  It was not scary nor associated with sig fear.  She is still dep and anxious without change otherwise since seen a couple of days ago. Tolerating med changes from last week.   Received Spravato  56 mg again and tolerated it without adverse SE.  Typical SE dissociation, no NV, palpitations, HA.  Motivated to continue the Spravato  and agrees to increase it.  Better experience than the first time. Plan: Increase quetiapine  to 400 mg HS  04/26/23 TC:  Patient taking 300 mg of Seroquel  and 1 mg of Xanax  for sleep. She can get to sleep, just can't stay asleep. Doesn't say how long she is sleeping, but says she needs 9 hours of sleep. Reporting social isolation is bad, she won't leave the house, but doesn't like being home alone.  Reports she has never been this sick mentally and feels like she may need to go to the hospital. No SI, just doesn't like being alone.     MD resp:  Pt just seen yesterday and received 2nd Spravato  56 mg .  That was positive experience. However she is still severely depressed  and ruminating and reassurance seeking..  She doesn't need to go to the hospital. She told me yesterday she slept 7 hours but needs 9 hours.  7 hours is not bad.   She is taking quetiapne 300 mg HS with Xanax  1 mg HS.  No increase in Xanax  but can increase quetiapine  to 400 mg HS (max dose 800) and this  could help sleep and anxiety and depression. She should keep appt next week for next Spravato .  In case she asks, Spravato  is not making her worse in any way.     05/01/23 appt noted;H present also Current meds; quetiapine  400 mg HS, sertraline  100 mg daily, alprazolam  0.5 mg TID and 1 mg HS. Tolerating med changes.    Received Spravato  84 mg first time.  Typical SE dissociation, no NV, palpitations, HA.  However she had a bad experience DT severe anxiety before starting the Spravato .   Very fearful, scared.  Without specific reasons.  Severe anxiety dep, hopeless.  No SI but doesn't want to live like this.  So anxious hard to sleep and some crying spells. Seroquel  not helping sleep.  05/03/23 appt noted: Current meds; quetiapine  400 mg HS, sertraline  100 mg daily, alprazolam  0.5 mg TID and 1 mg HS. Tolerating med changes.    Received Spravato  84 mg.  Typical SE dissociation, no NV, palpitations, HA.  However she had a bad experience DT severe anxiety before starting the Spravato .   Highly anxious, fearful of everything including Spravato  admin.  Afraid she will forget who she is despite having it before.  Still anxious and trouble sleeping at home.  Sleep no better with quetiapine  and still ruminatiing.   Plan increase quetiapine  to 500 mg HS  05/08/23 appt noted: Meds: quetiapine  500 mg HS, sertraline  100 mg daily, alprazolam  0.5 mg TID and 1 mg HS. Received Spravato  84 mg 3rd time.  Typical SE dissociation, no NV, palpitations, HA.  However she had a bad experience DT severe anxiety before starting the Spravato .   She is not better so far.  Ongoing severe depression anxiety, avoidance, anhedonia, rumination, afraid to be alone. All not typical of herself.   No SE with meds except constipation and sweating at night.  Plan:  Increase quetiapine  to 600 mg at night for sleep and depression & rumination and next week to 600 HS increased alprazolam  1 mg at night for sleep and 0.5 mg TID Increase  sertraline  150 mg daily  Spravato  84 mg AM twice weekly for severe TRD  05/10/23 appt noted:  Meds: quetiapine  600 mg HS, sertraline  150 mg daily, alprazolam  0.5 mg TID and 1 mg HS. Received Spravato  84 mg 4th time.  Typical SE dissociation, no NV, palpitations, HA.  However she had a bad experience DT severe anxiety before starting the Spravato .   She is not better so far.  Ongoing severe depression anxiety, avoidance, anhedonia, rumination, afraid to be alone. All not typical of herself.   SE constipation she finds unmanageable  Did sleep better with increased quetiapine  600 mg Hs but cannot continue dT constipation.  However dep and anxiety are no better with Spravato  or the other treatments except better sleep.  Anxiety ongoing severe with rumination.  05/17/23 appt noted:  Meds: switched to olanzapine  15 mg HS, sertraline  150, alprazolam   Tremor seems worse but H disagrees. Mood continually worse.  She is dependent on H and afraid to be alone.  No sweating. Xanax  helped but wears off after a couple of  hours.  She is open to antoher option Tolerating med changes. No improvement except slept a little better last night but still not enough. No Spravato  today bc 2 weeks of it didn't help.  Agrees to pursue ECT but hoping med changes will help Crying spells.  07/10/23 appt noted: Great.  Gerlean back at work.  Playing golf again.   Med: sertraline  200, propranolol  10 BID, olanzapine  15 pm, clonazepam  1 mg HS Did not require ECT.  All of a sudden something clicked and gradually better .  Doesn't even recognize the person she was with depression.   No negative thoughts.  Laughing again.  More social and talking again.  Happy in the am.  Sleep very well 10-11 hours.  Everything back to normal .  Doesn't need Gerlean with her now.  Ok being alone.  Anxiety gone also. SE carb craving and wt gain 10#.   Plan: Propranolol  10 mg tablet 2-3 tablets twice daily as needed for tremor Continue clonazepam   1 mg tablet 1/2 twice daily and 1 tablet at night Continue sertraline  200 tablets daily. Switch olanzapine  15 to Lybalvi 10 DT wt gain.    08/10/23 appt noted: Psych med: sertraline  200, propranolol  10 BID, Lybalvi 10 pm, clonazepam  1 mg HS Up to 141# with Lybalvi.  Went to wt reducing clinic and got shot semaglutide.  $75/ week.  That took appetite away.   Was a little more down after a couple of missed doses of Lybalvi. Mood had been great  until the missed dose.  Sleeping a lot.  No problems with change from olanzapine  15 to Lybalvi 10-10 daily but no benefit with appetite reduction.   Party tomorrow night.  Got house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

## 2023-10-04 NOTE — Telephone Encounter (Signed)
 No 90 day bc weaning

## 2023-10-04 NOTE — Patient Instructions (Signed)
 Reduce olanzapine to 10 mg nightly Reduce sertraline to 1 and 1/2 of the 100 mg tablets to see if flat emotions are better Add methylphenidate 10 mg 1 tablet in the AM, noon, 4 pm for 1 week and if not better increase to 1 and 1/2 tablet 3 times daily

## 2023-10-10 ENCOUNTER — Encounter: Payer: Self-pay | Admitting: Primary Care

## 2023-10-10 ENCOUNTER — Ambulatory Visit: Payer: Medicare Other | Admitting: Primary Care

## 2023-10-10 VITALS — BP 142/86 | HR 76 | Temp 98.1°F | Ht 62.5 in | Wt 150.0 lb

## 2023-10-10 DIAGNOSIS — F332 Major depressive disorder, recurrent severe without psychotic features: Secondary | ICD-10-CM | POA: Diagnosis not present

## 2023-10-10 DIAGNOSIS — R140 Abdominal distension (gaseous): Secondary | ICD-10-CM

## 2023-10-10 DIAGNOSIS — F411 Generalized anxiety disorder: Secondary | ICD-10-CM

## 2023-10-10 DIAGNOSIS — R350 Frequency of micturition: Secondary | ICD-10-CM | POA: Diagnosis not present

## 2023-10-10 LAB — POC URINALSYSI DIPSTICK (AUTOMATED)
Bilirubin, UA: NEGATIVE
Blood, UA: NEGATIVE
Glucose, UA: NEGATIVE
Ketones, UA: NEGATIVE
Leukocytes, UA: NEGATIVE
Nitrite, UA: NEGATIVE
Protein, UA: NEGATIVE
Spec Grav, UA: 1.01 (ref 1.010–1.025)
Urobilinogen, UA: 0.2 U/dL
pH, UA: 6 (ref 5.0–8.0)

## 2023-10-10 NOTE — Assessment & Plan Note (Signed)
 Exam overall benign today. Reviewed CT abdomen pelvis from December 2024.  Follow-up with GI.

## 2023-10-10 NOTE — Assessment & Plan Note (Signed)
 Improving.  Following with psychiatry, office notes reviewed from January 2025.  Continue Zyprexa  10 mg daily, methylphenidate  10 mg 3 times daily, olanzapine  10 mg daily, sertraline  150 mg daily, clonazepam  1 mg as needed, propranolol  10 mg as needed.

## 2023-10-10 NOTE — Patient Instructions (Signed)
 It was a pleasure to see you today!

## 2023-10-10 NOTE — Telephone Encounter (Signed)
 LVM TO RC.

## 2023-10-10 NOTE — Assessment & Plan Note (Addendum)
 Improving.  Following with psychiatry, office notes reviewed from January 2025.  Continue Zyprexa  10 mg daily, methylphenidate  10 mg 3 times daily, olanzapine  10 mg daily, sertraline  150 mg daily, clonazepam  1 mg as needed, propranolol  10 mg as needed.  Referral placed for therapy per patient request.

## 2023-10-10 NOTE — Telephone Encounter (Signed)
 Spoke with Andrea [name] regarding CC's recommendations. She let me know that CC had already spoken to her regarding the Olanzapine  10 mg.  She said the stimulant that he gave her has really helped her. She didn't remember the name of it.
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
## 2023-10-10 NOTE — Assessment & Plan Note (Signed)
 UA today negative today. Discussed with patient.  Given no other symptoms, would recommend to evaluate intake of water/caffeine. This could be secondary to recent addition of her stimulant medication.

## 2023-10-20 ENCOUNTER — Other Ambulatory Visit: Payer: Self-pay | Admitting: Psychiatry

## 2023-10-20 DIAGNOSIS — R251 Tremor, unspecified: Secondary | ICD-10-CM

## 2023-10-20 DIAGNOSIS — F411 Generalized anxiety disorder: Secondary | ICD-10-CM

## 2023-10-24 ENCOUNTER — Ambulatory Visit (INDEPENDENT_AMBULATORY_CARE_PROVIDER_SITE_OTHER): Payer: Medicare Other | Admitting: Psychiatry

## 2023-10-24 ENCOUNTER — Encounter: Payer: Self-pay | Admitting: Psychiatry

## 2023-10-24 DIAGNOSIS — F411 Generalized anxiety disorder: Secondary | ICD-10-CM

## 2023-10-24 DIAGNOSIS — F339 Major depressive disorder, recurrent, unspecified: Secondary | ICD-10-CM

## 2023-10-24 DIAGNOSIS — F5105 Insomnia due to other mental disorder: Secondary | ICD-10-CM | POA: Diagnosis not present

## 2023-10-24 DIAGNOSIS — R251 Tremor, unspecified: Secondary | ICD-10-CM

## 2023-10-24 MED ORDER — OLANZAPINE 5 MG PO TABS: 5.0000 mg | ORAL_TABLET | Freq: Every day | ORAL | 0 refills | Status: DC

## 2023-10-24 MED ORDER — SERTRALINE HCL 100 MG PO TABS: 200.0000 mg | ORAL_TABLET | Freq: Every day | ORAL | 0 refills | Status: DC

## 2023-10-24 MED ORDER — CLONAZEPAM 1 MG PO TABS: 1.0000 mg | ORAL_TABLET | Freq: Every evening | ORAL | 0 refills | Status: DC

## 2023-10-24 MED ORDER — METHYLPHENIDATE HCL 10 MG PO TABS: ORAL_TABLET | ORAL | 0 refills | Status: DC

## 2023-10-24 NOTE — Progress Notes (Signed)
Andrea [name] [id number] 11-16-1950 73 y.o.   Subjective:   Patient ID:  Andrea [name] is a 73 y.o. (DOB [birth year]) female.  Chief Complaint:  Chief Complaint  Patient presents with   Follow-up   Depression   Anxiety   Sleeping Problem    HPI Andrea [name] presents to the office today for follow-up of MDE and anxiety disorder.  seen 09/2019.  No meds were changed.  Been on Lexapro 20 since early 2017.  02/27/2020 phone call from patient: Pt experiencing more anxiety than she normally has. Pt would like to know what are some options that she has as far as her medication. Pt will be in an appt  MD response: If the anxiety is just occasional then using alprazolam as needed would be an okay thing to do.  If it is been fairly persistent for a couple of weeks or more than the simplest solution would be to increase the Lexapro to 1-1/2 tablets daily.  Historically she has done well on Lexapro 20 mg daily for an extended period of time.  If she is under temporary stress then once that is resolved we could drop it back to 1 daily.  If this does not work as expected then she should schedule an earlier appointment. Pt response: Patient called back and she's been having temporary stress, but it's not bad. Things are great, she's working out and feeling okay for the most part. It's been going on for a bit so she agrees to the increase Lexapro 20 mg 1.5 tablets daily. She has been taking alprazolam at hs occasionally to help sleep better and it does help.   12/06/2020 appointment with the following noted:  No covid.  H Covid and recovered.   Had increased Lexapro to 30 mg for 90 days and saw a difference but started seeing weight gain and was doing oK and able to drop back and been OK. Still renovating a house for a year.  Sold her house and mourns the house. Calmed down.  Kept 3 gkids and dogs 10 days.   Andrea [name] Hopes for new house May 1. Rare alprazolam for HS. Stress moving to condo and would have crying  spell over the move and some problems she was running into dealing with it.  Hard with change.  But taking time to adjust to the idea of moving from her house of 23 years.  Initially refused but he left it up to her and she's come to peace about it.  Andrea [name].   Good response to medication and no SE.  Exercising is good medicine.  If doesn't then doesn't feel as good. Satisfied. Plan: no med changes  08/01/21  appt noted:  seen with [name] died Jul 31, 2024.  A lot of ups and downs per D.  Hard dips. Got worse when retired and dealing with the new  house.  Had trouble getting rid of things from the old house causing regrets over sellling the house.  D notes doesn't do well with instability  and stressors.  Ruminates on past mistakes. Blamed Andrea [name] for the  difficulty renovating the house.  Neg self talk and beats herself up. When not enough sleep will get depressed.  Does better if busy.   Dreads things.  Patient denies difficulty with sleep initiation or maintenance. Denies appetite disturbance.  Patient reports that energy and motivation have been good. Patient denies any difficulty with concentration.  Patient denies any suicidal ideation.  Plan: Abilify 2.5 mg daily for a week, then 5 mg daily. Continue Lexapro 20 mg daily  08/31/2021 appt noted: It worked immediately.  Insomnia with EMA. Normally 9 hours.  It's like I'm high.  Not tired.  Average 3-4 hours. Not depressed and no crazy negative thoughts.  Good response.  Energetic but up in middle of night. Plan: Okay to stop Abilify because of insomnia.   09/29/2021 appointment with the following noted: Parties were great.  Sleep problem resolved.  A lot of rest at the beach. Maybe the last week or 2 less motivation and socialization.  Not laying in the bed.  Just not as good as she was.  Not as outgoing as normal. No alcohol now.  Only had 1-2 at night.  Not ruminating.   Willing to restart Abilify at lower dose 2 mg daily. 3 grandsons   Plan: Relapse depression recurs she can resume Abilify  but lower dose 2 mg daily or every other day to try to avoid the insomnia where she can contact our office and we can discussed the possibility of an alternative antidepressant such as Trintellix or duloxetine.  12/28/2021 appointment noted: Several phone calls since she was here.  Abilify plus Lexapro failed.  Switch to duloxetine 90 mg which she started 12/02/2018 2023 Started lamotrigine. Also started Rexulti Sister has had cycling depression that has responded well to lamotrigine with poor response to SSRIs [name] with a little dementia. SE tremor 90 mg duloxetine, ? Wt gain over a few weeks.. Exercise and wt control Social isolation, low self esteem, worry about the way she looks and not usually that way. Crying better with duloxetine.  More dependent on H.   Anhedonia.  Black hole.  Never this low. No SI Having to use Xanax.   Asks about day treatment and TMS Plan: rec TMS Continue lamotrigine as prescribed Increase Rexulti to 1 mg daily Switch to Trintellix:  reduce duloxetine to 2 of the 30 mg capsules and start Trintellix 5 mg daily for 5 days,  Then Increase Trintellix to 10 mg daily and reduce duloxetine to 1 capsule for 1 week. Then stop duloxetine.  02/15/22 appt noted: On Trintellix , Rexulti 1 and lamotrigine I feel good.  Walks 3 miles daily is great for mental health. TMS would not be covered by [facility]. Started feeling better after a week or 2 after the last visit. SE appetite increased. No nausea. Not crying anymore. Sleeping well and no longer ruminates. Plan: Continue Trintellix but DC Rexulti due to weight gain.  Continue lamotrigine  03/06/2022 appointment with the following noted: Last 2 weeks has been very sad and nervous. Trintellix was increased to 20 mg daily  03/10/2022 complaining of ongoing depression, feeling jittery, negative thinking, early morning awakening, ruminating about past decisions.  Wanting to  consider TMS because she is desperate for improvement. Plan we will schedule urgent appointment  03/15/22 urgent appt noted:  seen with Andrea [name] Very depressed, worst ever.  Don't want to be alone, ongoing worry, ruminating on past decisions.  Racing negative thoughts.  No motivation.  Trouble staying asleep.  Getting 4 hours and used to 9 hours. Dread getting up. Using Xanax Increased Trintellix to 20 mg 9 days ago. H agrees sleep is critical. Isolating.   Need to be better by July 11. Plan: Clonazepam 1 mg HS. Continue trintellix 20 mg daily she is only been on this dose 9 days and it needs more time. Re lamotrigine and increase  to 150 mg daily.Andrea [name] 1.5 mg every other day Option TMS  03/24/22 appt noted: So much better.  Thinking straighter and not negative and not sad.  Sleeping better 8-9 hours.. No crying. H notices progressively better every day.  Mind clarity is much better.   No SE noted.  No hangover.  No nausea. Found a therapist, Andrea [name].   Feels well enough to go to [place] and kind of excited.  Back July 16.   Plan: Clonazepam 1 mg HS. This resolved insomnia without SE Continue trintellix 20 mg daily she is only been on this dose 20 days and it needs more time. Re lamotriginecontinue 150 mg daily.Andrea [name] 1.5 mg every other day Option TMS  04/12/22 appt noted: Great.  Went to [place].  Was herself and enjoyed it.  Anxiety was managed.   [facility] and liked her. 3# wt gain.   Patient reports stable mood and denies depressed or irritable moods.  Patient denies any recent difficulty with anxiety.  Patient denies difficulty with sleep initiation or maintenance. Denies appetite disturbance.  Patient reports that energy and motivation have been good.  Patient denies any difficulty with concentration.  Patient denies any suicidal ideation. Sleeping well than not anxious. Plan: Trintellix 20  07/10/22 TC depression returned  recently.  08/15/22 appt noted: Still blunted  and diminished motivation.  Some days better than others.  Going to gym.  Partially better.   Still has a lot of anxiety and doesn't want to be alone. Functioning but not normal.  Dreads parties.   SE tremor for a couple of week.sleep great.  Dep 7/10. Current Vraylar 1.5 mg daily, increase lamotrigine 200 mg daily, Trintellix 20 , clonazepam 0.5 mg HS Plan: Reduce Clonazepam 1/2  mg HS. This resolved insomnia without SE Continue trintellix 20 mg daily only mildly effective. lamotrigine continue 200 mg daily.Andrea [name] DT lost response Auvelity Stop Vraylar After Thanksgiving stop Trintellix Wait 3 days then [employer] 1 in the morning for 1 week,  and if no side effects then increase Auvelity to 1 in the AM and 1 in the PM  09/04/22 TC: Complaining of persistent depression and wanting to do something else to help.  Added lithium CR 300 mg nightly  09/11/2022 phone call complaining of no improvement with Auvelity and lithium.  Appointment was moved up.  09/13/22 appt noted: Nothing changed. No better and no worse.  Awakens sad and tearful.  Sleeps well. Pushing herself to the gym.  Has a trainer. On clonazepam 0.5 mg HS She remains anxious when alone for no apparent reason.  Feelings of inferiority.  She is normally extroverted but now she is wanting to isolate from people.  All tasks seem monumental.  No enjoyment and not looking forward to anything.  Everything is a Personal assistant.  She remains generally worried and anxious which is not typical for her. Plan: Clonazepam 1/2  mg HS. This resolved insomnia without SE Stop Auvelity Start nortriptyline 1 of the 25 mg capsules at night for 4 nights then 2 at night for 4 nights then 3 at night, then wait 1 week and get the blood test in the morning at [facility] Reduce lamotrigine to 1 and 1/2 tablets daily  Lamotrigine has not been helpful to him 100 mg daily.  Start reducing lamotrigine 150 mg  daily and will continue to taper at next appointment.  09/27/21 urgent appt :  she wanted urgent appt bc desperate to feel better.  Seen with H Called at least twice since here for the same reasons of depression. Not sleeping well even with clonazepam 1 mg HS. Doesn't want to be alone. Shakey.   On lithium 300 mg daily, nortriptyline 75 mg HS.   Doesn't want to live like this but not acutely suicidal. Tolerating meds. Plan: pursue [facility] protocol TMS at [facility] bc faster optin than alternatives  10/30/22 TC: finished 50 TMS tx in the week and wants appt ASAP bc still struggling with crying and depresssion..  11/03/22 urgent appt : Completed TMS last week. Tue-Thur better days and then Friday nose-dived. Not done well since got back.   H notes high anxiety and crying in AM and PM and doesn't want to be alone.  Xanxax seems to help. Xanax helps and taking 0.5 mg AM and 1 mg HS H notes memory is sharper and she's more alert after treatment. Very anxioius all the time.  Get up sad.  Exercising. Sleep good with Xanax 1 mg HS. Plan: Increase Lexapro 20 mg dailly (should help crying and maybe anxiety but unlikely to resolve depression) Increase alprazolam to 0.5 mg TID and 1 mg HS for severe anxiety. H notes it helps. Pramipexole 0.25 BID for 5 days and if NR then 0.5 mg Bid off label. Reduce lamotrigine 100 mg for 2 weeks then 50 mg daily for 2 weeks then stop it. She wants to pursue Spravato if pramipexole fails  11/17/22 emergency work in appt: Pramipexole was not sent in and MD not aware until yesterday.  She is not any better and is desperate for a med change. Sleeping well and taking Xanax 1 mg HS Mornings are worse.  Not crying much.  Last Friday was a bad day and she didn't want to be alone.  But did let her H play golf once this week. Reduced lamotrigine today to 50 BID and weaning off. H says better this week than last week. Increased Lexapro to 20 mg daily. Plan: Increased Lexapro 20  mg dailly (helped crying and maybe anxiety but unlikely to resolve depression) Continue alprazolam to 0.5 mg TID prn anxiety and 1 mg HS. H notes it helps. Pramipexole 0.25 mg BID for 3 days then 0.5 mg BID Reduce lamotrigine 50 mg daily for 2 weeks then stop it. She wants to pursue Spravato if pramipexole fails  12/25/22 appt urgently. COMPLETED 1 WEEK TMS [facility] without benefit except TMS took away brain fog.   She feels need to take Xanax 0.5 mg TID  Some sleepiness with Xanax . Mornings are better and fine while at the gym but when home gets depressed again.   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 Needs Xanax to do social things. Plan: Increased Lexapro 20 mg dailly (helped crying and maybe anxiety but unlikely to resolve depression) Continue alprazolam to 0.5 mg TID prn anxiety and 1 mg HS. H notes it helps. Change pramipexole 0.5mg  tablets, 1 tablet four times daily for 1 week,  Then if not better 1and 1/2 tablets in the AM and dinner and 1 tablet at lunch and dinner.    4/9-4/15 hosp for ruptured appendix and missed meds: Can restart Lexapro at 1/2 tablet daily for 3 days then 1 daily. Restart 1/2 pramipexole tablet 3 times daily for 3 days then 1 tablet 3 times daily for 3 days then 1 and 1/2 tablet 3 times daily.      01/12/23 appt noted: No  GI sx now.  No appetite but working on protein.   I think I'm doing real well mentally.  Has resumed meds.  Mood has been great. Pramipexole 0.5mg   1 and 1/2 TID Lexapro 20 Xanax reduced to 1 mg HS H sees a difference.   No SE with meds.   No med changes  01/25/23 appt noted: Meds:  pramipexole 0.75 mg TID, Xanax 1.5 mg HS and 0.5 mg prn, Lexapro 20 mg daily. Doing great.  Staples removed and healing from surgery appendectomy. Back in the gym helps her.   Satisfied with meds. Getting out socially and feeling like her old self.  Chelsea's H Brad went to ER vomiting blood.   Plan: no med changes  02/07/23 appt noted: Back from [facility]  yesterday.  Had ear infection.  Then conjunctivitis.  Eyes hurt.   Before the surgery was getting better and feels like she is sliding back some.  Gradually a little worse since here.  Wonders if she could get better with joy and social drive.   Off Abx .  Sleep is good with meds.  With depression then gets anxious too.  Is functioning.   Meds:  pramipexole 0.75 mg TID, Xanax 1.5 mg HS and 0.5 mg prn, Lexapro 20 mg daily. Consistent and no SE. Plan: Continue Lexapro 20 mg dailly (helped crying and maybe anxiety but unlikely to resolve depression) Continue alprazolam  1 mg HS. H notes it helps. Increase pramipexole 0.5mg  tablets, 2 tablets 3 times daily  01/31/23 TC:Andrea [name], CMA  to Me     02/20/23  4:56 PM Note Patient reporting she is not feeling as good as she thinks she should. Her anxiety is the biggest concern. She said she can go to the gym in the mornings and does ok, but she had a closing to go to recently and had to take 1/4 of a Xanax, reports it took the edge off. She reports daily tremors that are helped by Xanax. She is not crying, she is getting things done, sleeping okay. Will have her grandchildren next week and she isn't excited about it like she should be.    Taking 6 mirapex, 1.5 Xanax and Lexapro 20 mg.     MD resp:  CC   02/20/23  5:48 PM Note We just increased the pramipexole recently and I see her next week.  I do not want to increase the medicine any further until I see her again.      03/02/23 urgent appt noted: Some improvement with incr pramipexole 1 mg TID. Before felt worse in AM now feels better in AM and goes to gym. Otherwise no excitement.  Hard to make decisions. Less social and smiling.  Subdued more than normal.  Not confident.  Not enjoying present and ruminates in past.   No SE.  No impulsivity Has tremor for awhile .  Is mild for a month. Hard to conc.  Brain fog was better with TMS but is back. Continue Lexapro 20 mg dailly (helped crying  and maybe anxiety but unlikely to resolve depression) Continue alprazolam  1 mg HS. H notes it helps. Increase pramipexole 1 mg QID times daily  03/16/23 appt noted: Meds as above SE: If not enough food makes her excitable, urgent, talking fast.  Does it a bit too much.  Energy fine.   Feels better in the AM at the gym and when home a dark shadow comes over her.   Still feels dep overall.  Went to work with friends and didn't feel natural and didn't enjoy it like she should.  Pushes herself to stay active.   Takes 1.5 mg Xanax HS and then awakens and takes another 0.5 mg HS but gets enough sleep. A lot of brain fog.  Feels inferior and doesn't usually feel that way. Infrequent crying spells. Plan:  Continue Lexapro 20 mg dailly (helped crying and maybe anxiety but unlikely to resolve depression) Continue alprazolam  1 mg HS. H notes it helps. Reduce pramipexole 1 mg TID for 3 days, then 0.5 mg QID for 3 days, then 0.5 mg BID for 3 days, then 0.25 mg BID for 3 days, then stop it. Now start Latuda (lurasidone) 40 mg 1/2 tablet for 1 week.  If not benefit then 1 tablet daily with 350 calories  04/13/23 urgent appt note: with [facility] 04/05/23 for dep with SI "Worst I've ever been".  Feels out of control.  Shaking .  Can't sleep.  Feels disconnected.   Hated being hosp bc locked up.   Taking quetiapine 100 hS and lorazepam and still can't sleep. Started sertraline 50 and stopped Lexapro. Quetiapine 100 mg HS Plan: Stop hydroxyzine Increase quetiapine 200 then 300 mg at night for sleep and depression  Stop lorazepam and return to alprazolam 1 mg at night for sleep Increase sertraline to 100 mg daily or 2 of 50 mg nightly  04/23/23 appt noted: Here for first appt with Spravato.  Highly anxious about getting Spravato today.  Doesn't like feeling out of control and worries over this. Still very depressed.  Doesn't think her sx as noted are due to depression.  Thinks she has a neuro problem.  But she  has no other neuro sx like numbness, weakness, balance px, or anything other than dep and anxiety sx. Received Spravato 56 mg today.  "It was horrible!" Bc she felt out of control and was anxious receiving it.  Thought it would last forever re: dissociation.  It was scary to her.  Couldn't relax for it.  No N, V, HA.  Ambivalent about continuing Spravato but open. No specific concerns with meds.    04/25/23 appt noted: Needed Xanax before visit today to overcome fear of coming to the appt. She was more disciplined about controlling neg thought during Spravato admin and had a much better experience.  It was not scary nor associated with sig fear.  She is still dep and anxious without change otherwise since seen a couple of days ago. Tolerating med changes from last week.   Received Spravato 56 mg again and tolerated it without adverse SE.  Typical SE dissociation, no NV, palpitations, HA.  Motivated to continue the Spravato and agrees to increase it.  Better experience than the first time. Plan: Increase quetiapine to 400 mg HS  04/26/23 TC:  Patient taking 300 mg of Seroquel and 1 mg of Xanax for sleep. She can get to sleep, just can't stay asleep. Doesn't say how long she is sleeping, but says she needs 9 hours of sleep. Reporting social isolation is bad, she won't leave the house, but doesn't like being home alone.  Reports she has never been this sick mentally and feels like she may need to go to the hospital. No SI, just doesn't like being alone.     MD resp:  Pt just seen yesterday and received 2nd Spravato 56 mg .  That was positive experience. However she is still severely depressed and ruminating and reassurance  seeking..  She doesn't need to go to the hospital. She told me yesterday she slept 7 hours but needs 9 hours.  7 hours is not bad.   She is taking quetiapne 300 mg HS with Xanax 1 mg HS.  No increase in Xanax but can increase quetiapine to 400 mg HS (max dose 800) and this could help  sleep and anxiety and depression. She should keep appt next week for next Spravato.  In case she asks, Spravato is not making her worse in any way.     05/01/23 appt noted;H present also Current meds; quetiapine 400 mg HS, sertraline 100 mg daily, alprazolam 0.5 mg TID and 1 mg HS. Tolerating med changes.    Received Spravato 84 mg first time.  Typical SE dissociation, no NV, palpitations, HA.  However she had a bad experience DT severe anxiety before starting the Spravato.   Very fearful, "scared".  Without specific reasons.  Severe anxiety dep, hopeless.  No SI but doesn't want to live like this.  So anxious hard to sleep and some crying spells. Seroquel not helping sleep.  05/03/23 appt noted: Current meds; quetiapine 400 mg HS, sertraline 100 mg daily, alprazolam 0.5 mg TID and 1 mg HS. Tolerating med changes.    Received Spravato 84 mg.  Typical SE dissociation, no NV, palpitations, HA.  However she had a bad experience DT severe anxiety before starting the Spravato.   Highly anxious, fearful of "everything" including Spravato admin.  Afraid she will forget who she is despite having it before.  Still anxious and trouble sleeping at home.  Sleep no better with quetiapine and still ruminatiing.   Plan increase quetiapine to 500 mg HS  05/08/23 appt noted: Meds: quetiapine 500 mg HS, sertraline 100 mg daily, alprazolam 0.5 mg TID and 1 mg HS. Received Spravato 84 mg 3rd time.  Typical SE dissociation, no NV, palpitations, HA.  However she had a bad experience DT severe anxiety before starting the Spravato.   She is not better so far.  Ongoing severe depression anxiety, avoidance, anhedonia, rumination, afraid to be alone. All not typical of herself.   No SE with meds except constipation and sweating at night.  Plan:  Increase quetiapine to 600 mg at night for sleep and depression & rumination and next week to 600 HS increased alprazolam 1 mg at night for sleep and 0.5 mg TID Increase sertraline  150 mg daily  Spravato 84 mg AM twice weekly for severe TRD  05/10/23 appt noted:  Meds: quetiapine 600 mg HS, sertraline 150 mg daily, alprazolam 0.5 mg TID and 1 mg HS. Received Spravato 84 mg 4th time.  Typical SE dissociation, no NV, palpitations, HA.  However she had a bad experience DT severe anxiety before starting the Spravato.   She is not better so far.  Ongoing severe depression anxiety, avoidance, anhedonia, rumination, afraid to be alone. All not typical of herself.   SE constipation she finds unmanageable  Did sleep better with increased quetiapine 600 mg Hs but cannot continue dT constipation.  However dep and anxiety are no better with Spravato or the other treatments except better sleep.  Anxiety ongoing severe with rumination.  05/17/23 appt noted:  Meds: switched to olanzapine 15 mg HS, sertraline 150, alprazolam  Tremor seems worse but H disagrees. Mood continually worse.  She is dependent on H and afraid to be alone.  No sweating. Xanax helped but wears off after a couple of hours.  She is  open to antoher option Tolerating med changes. No improvement except slept a little better last night but still not enough. No Spravato today bc 2 weeks of it didn't help.  Agrees to pursue ECT but hoping med changes will help Crying spells.  07/10/23 appt noted: Great.  Andrea [name] back at work.  Playing golf again.   Med: sertraline 200, propranolol 10 BID, olanzapine 15 pm, clonazepam 1 mg HS Did not require ECT.  All of a sudden something clicked and gradually better .  Doesn't even recognize the person she was with depression.   No negative thoughts.  Laughing again.  More social and talking again.  Happy in the am.  Sleep very well 10-11 hours.  Everything back to normal .  Doesn't need Andrea [name] with her now.  Ok being alone.  Anxiety gone also. SE carb craving and wt gain 10#.   Plan: Propranolol 10 mg tablet 2-3 tablets twice daily as needed for tremor Continue clonazepam 1 mg tablet  1/2 twice daily and 1 tablet at night Continue sertraline 200 tablets daily. Switch olanzapine 15 to Lybalvi 10 DT wt gain.    08/10/23 appt noted: Psych med: sertraline 200, propranolol 10 BID, Lybalvi 10 pm, clonazepam 1 mg HS Up to 141# with Lybalvi.  Went to wt reducing clinic and got shot semaglutide.  $75/ week.  That took appetite away.   Was a little more down after a couple of missed doses of Lybalvi. Mood had been great  until the missed dose.  Sleeping a lot.  No problems with change from olanzapine 15 to Lybalvi 10-10 daily but no benefit with appetite reduction.   Party tomorrow night.  Got house decorated yesterday.   Since appendectomy swollen though abdomen.  Abd protruding.   Plan: continue meds except switch back to olanzapine 10  08/27/23 TC:  Andrea [name] reports that she has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

## 2023-10-30 ENCOUNTER — Other Ambulatory Visit: Payer: Self-pay | Admitting: Psychiatry

## 2023-10-30 DIAGNOSIS — F339 Major depressive disorder, recurrent, unspecified: Secondary | ICD-10-CM

## 2023-11-11 ENCOUNTER — Other Ambulatory Visit: Payer: Self-pay | Admitting: Psychiatry

## 2023-11-11 DIAGNOSIS — R251 Tremor, unspecified: Secondary | ICD-10-CM

## 2023-11-11 DIAGNOSIS — F411 Generalized anxiety disorder: Secondary | ICD-10-CM

## 2023-11-12 NOTE — Telephone Encounter (Signed)
Dose reduced

## 2023-11-16 ENCOUNTER — Other Ambulatory Visit: Payer: Self-pay | Admitting: Psychiatry

## 2023-11-16 DIAGNOSIS — F339 Major depressive disorder, recurrent, unspecified: Secondary | ICD-10-CM

## 2023-11-16 NOTE — Telephone Encounter (Signed)
Spoke with pt she clarified that she is taking 5mg  daily.

## 2023-11-22 ENCOUNTER — Telehealth: Payer: Self-pay | Admitting: Primary Care

## 2023-11-22 NOTE — Telephone Encounter (Signed)
 Copied from CRM [id number]. Topic: Clinical - Medical Advice >> Nov 22, 2023 11:42 AM Tiffany H wrote: Reason for CRM: Patient called to follow up on request for behavioral health questionnaire. Unable to answer any questions due to [facility] being confidential. Please assist.

## 2023-11-22 NOTE — Telephone Encounter (Signed)
 Unable to reach patient. Left voicemail to return call to our office.    Per Doree Barthel, paperwork was received and patient needs to call [phone] to schedule with her.

## 2023-11-23 NOTE — Telephone Encounter (Signed)
 Called patient and reviewed all information. Patient verbalized understanding. Will call if any further questions.

## 2023-11-28 ENCOUNTER — Telehealth: Payer: Self-pay | Admitting: Psychiatry

## 2023-11-28 ENCOUNTER — Other Ambulatory Visit: Payer: Self-pay

## 2023-11-28 DIAGNOSIS — F339 Major depressive disorder, recurrent, unspecified: Secondary | ICD-10-CM

## 2023-11-28 MED ORDER — METHYLPHENIDATE HCL 10 MG PO TABS: ORAL_TABLET | ORAL | 0 refills | Status: DC

## 2023-11-28 NOTE — Telephone Encounter (Signed)
 Patient called in for refill on Ritalin 10mg . Ph:  [phone] Appt 3/12 Pharmacy [facility] [address] Hassell Halim

## 2023-11-28 NOTE — Telephone Encounter (Signed)
 Pended Ritalin 10 mg to [facility]

## 2023-11-30 ENCOUNTER — Telehealth: Payer: Self-pay | Admitting: Psychiatry

## 2023-11-30 NOTE — Telephone Encounter (Signed)
 Called pharmacy and it requires a PA.

## 2023-11-30 NOTE — Telephone Encounter (Signed)
 Pt called reporting issues getting Ritalin filled. The way Rx  written. RTC [phone] Will not even let pt pay out of pocket

## 2023-11-30 NOTE — Telephone Encounter (Signed)
Thank you for fixing this problem.

## 2023-11-30 NOTE — Telephone Encounter (Signed)
 Contacted pt's pharmacy regarding Methylphenidate 10 mg #105 for 30 days and they report they are asking for a prior authorization for it this month but on 2/05 Medicare did cover it. Pharmacist reports for whatever reason Medicare in the last 29 days decided they didn't want to cover the quantity. Asked if pt could pay out of pocket today since its a cheap medication and pt is out. He checked his stock and does have that quantity and said he will go ahead and fill today.   Contacted pt to let her know pharmacist will have it filled after lunch today and she will pay out of pocket for it. She was happy to hear that, she reports it works very well for her depression.

## 2023-11-30 NOTE — Telephone Encounter (Signed)
 Pt said she is out of medication and wants to pay out of pocket. She said pharmacy told her she could not do that. Called pharmacy again and they said Medicare is now requiring to go thru PA process, cannot pay out of pocket.

## 2023-12-05 ENCOUNTER — Encounter: Payer: Self-pay | Admitting: Psychiatry

## 2023-12-05 ENCOUNTER — Ambulatory Visit: Payer: Medicare Other | Admitting: Psychiatry

## 2023-12-05 DIAGNOSIS — F411 Generalized anxiety disorder: Secondary | ICD-10-CM

## 2023-12-05 DIAGNOSIS — R251 Tremor, unspecified: Secondary | ICD-10-CM | POA: Diagnosis not present

## 2023-12-05 DIAGNOSIS — F339 Major depressive disorder, recurrent, unspecified: Secondary | ICD-10-CM

## 2023-12-05 DIAGNOSIS — F5105 Insomnia due to other mental disorder: Secondary | ICD-10-CM | POA: Diagnosis not present

## 2023-12-05 MED ORDER — OLANZAPINE 2.5 MG PO TABS: 2.5000 mg | ORAL_TABLET | Freq: Every day | ORAL | 0 refills | Status: DC

## 2023-12-05 MED ORDER — METHYLPHENIDATE HCL ER (OSM) 54 MG PO TBCR
54.0000 mg | EXTENDED_RELEASE_TABLET | ORAL | 0 refills | Status: DC
Start: 2023-12-05 — End: 2024-01-02

## 2023-12-05 NOTE — Progress Notes (Signed)
 Andrea [name] [id number] 03-Dec-1950 73 y.o.   Subjective:   Patient ID:  Andrea [name] is a 73 y.o. (DOB [birth year]) female.  Chief Complaint:  Chief Complaint  Patient presents with   Follow-up   Depression   Anxiety    HPI Andrea [name] presents to the office today for follow-up of MDE and anxiety disorder.  seen 09/2019.  No meds were changed.  Been on Lexapro 20 since early 2017.  02/27/2020 phone call from patient: Pt experiencing more anxiety than she normally has. Pt would like to know what are some options that she has as far as her medication. Pt will be in an appt  MD response: If the anxiety is just occasional then using alprazolam as needed would be an okay thing to do.  If it is been fairly persistent for a couple of weeks or more than the simplest solution would be to increase the Lexapro to 1-1/2 tablets daily.  Historically she has done well on Lexapro 20 mg daily for an extended period of time.  If she is under temporary stress then once that is resolved we could drop it back to 1 daily.  If this does not work as expected then she should schedule an earlier appointment. Pt response: Patient called back and she's been having temporary stress, but it's not bad. Things are great, she's working out and feeling okay for the most part. It's been going on for a bit so she agrees to the increase Lexapro 20 mg 1.5 tablets daily. She has been taking alprazolam at hs occasionally to help sleep better and it does help.   12/06/2020 appointment with the following noted:  No covid.  H Covid and recovered.   Had increased Lexapro to 30 mg for 90 days and saw a difference but started seeing weight gain and was doing oK and able to drop back and been OK. Still renovating a house for a year.  Sold her house and mourns the house. Calmed down.  Kept 3 gkids and dogs 10 days.   Andrea [name] Hopes for new house May 1. Rare alprazolam for HS. Stress moving to condo and would have crying spell over the move  and some problems she was running into dealing with it.  Hard with change.  But taking time to adjust to the idea of moving from her house of 23 years.  Initially refused but he left it up to her and she's come to peace about it.  Andrea [name].   Good response to medication and no SE.  Exercising is good medicine.  If doesn't then doesn't feel as good. Satisfied. Plan: no med changes  08/01/21  appt noted:  seen with D Andrea [name] Mo died 07-24-24.  A lot of ups and downs per D.  Hard dips. Got worse when retired and dealing with the new  house.  Had trouble getting rid of things from the old house causing regrets over sellling the house.  D notes doesn't do well with instability  and stressors.  Ruminates on past mistakes. Blamed [name] for the  difficulty renovating the house.  Neg self talk and beats herself up. When not enough sleep will get depressed.  Does better if busy.   Dreads things.  Patient denies difficulty with sleep initiation or maintenance. Denies appetite disturbance.  Patient reports that energy and motivation have been good. Patient denies any difficulty with concentration.  Patient denies any suicidal ideation.  Plan: Abilify 2.5 mg  daily for a week, then 5 mg daily. Continue Lexapro 20 mg daily  08/31/2021 appt noted: It worked immediately.  Insomnia with EMA. Normally 9 hours.  It's like I'm high.  Not tired.  Average 3-4 hours. Not depressed and no crazy negative thoughts.  Good response.  Energetic but up in middle of night. Plan: Okay to stop Abilify because of insomnia.   09/29/2021 appointment with the following noted: Parties were great.  Sleep problem resolved.  A lot of rest at the beach. Maybe the last week or 2 less motivation and socialization.  Not laying in the bed.  Just not as good as she was.  Not as outgoing as normal. No alcohol now.  Only had 1-2 at night.  Not ruminating.   Willing to restart Abilify at lower dose 2 mg daily. 3 grandsons  Plan: Relapse  depression recurs she can resume Abilify  but lower dose 2 mg daily or every other day to try to avoid the insomnia where she can contact our office and we can discussed the possibility of an alternative antidepressant such as Trintellix or duloxetine.  12/28/2021 appointment noted: Several phone calls since she was here.  Abilify plus Lexapro failed.  Switch to duloxetine 90 mg which she started 12/02/2018 2023 Started lamotrigine. Also started Rexulti Sister has had cycling depression that has responded well to lamotrigine with poor response to SSRIs [name] with a little dementia. SE tremor 90 mg duloxetine, ? Wt gain over a few weeks.. Exercise and wt control Social isolation, low self esteem, worry about the way she looks and not usually that way. Crying better with duloxetine.  More dependent on H.   Anhedonia.  Black hole.  Never this low. No SI Having to use Xanax.   Asks about day treatment and TMS Plan: rec TMS Continue lamotrigine as prescribed Increase Rexulti to 1 mg daily Switch to Trintellix:  reduce duloxetine to 2 of the 30 mg capsules and start Trintellix 5 mg daily for 5 days,  Then Increase Trintellix to 10 mg daily and reduce duloxetine to 1 capsule for 1 week. Then stop duloxetine.  02/15/22 appt noted: On Trintellix , Rexulti 1 and lamotrigine I feel good.  Walks 3 miles daily is great for mental health. TMS would not be covered by [facility]. Started feeling better after a week or 2 after the last visit. SE appetite increased. No nausea. Not crying anymore. Sleeping well and no longer ruminates. Plan: Continue Trintellix but DC Rexulti due to weight gain.  Continue lamotrigine  03/06/2022 appointment with the following noted: Last 2 weeks has been very sad and nervous. Trintellix was increased to 20 mg daily  03/10/2022 complaining of ongoing depression, feeling jittery, negative thinking, early morning awakening, ruminating about past decisions.  Wanting to consider TMS  because she is desperate for improvement. Plan we will schedule urgent appointment  03/15/22 urgent appt noted:  seen with Andrea [name] Very depressed, worst ever.  Don't want to be alone, ongoing worry, ruminating on past decisions.  Racing negative thoughts.  No motivation.  Trouble staying asleep.  Getting 4 hours and used to 9 hours. Dread getting up. Using Xanax Increased Trintellix to 20 mg 9 days ago. H agrees sleep is critical. Isolating.   Need to be better by July 11. Plan: Clonazepam 1 mg HS. Continue trintellix 20 mg daily she is only been on this dose 9 days and it needs more time. Re lamotrigine and increase to 150 mg daily.Marland Kitchen  Vraylar 1.5 mg every other day Option TMS  03/24/22 appt noted: So much better.  Thinking straighter and not negative and not sad.  Sleeping better 8-9 hours.. No crying. H notices progressively better every day.  Mind clarity is much better.   No SE noted.  No hangover.  No nausea. Found a therapist, Krystal Eaton.   Feels well enough to go to [place] and kind of excited.  Back July 16.   Plan: Clonazepam 1 mg HS. This resolved insomnia without SE Continue trintellix 20 mg daily she is only been on this dose 20 days and it needs more time. Re lamotriginecontinue 150 mg daily.Leafy Kindle 1.5 mg every other day Option TMS  04/12/22 appt noted: Great.  Went to [place].  Was herself and enjoyed it.  Anxiety was managed.   [facility] and liked her. 3# wt gain.   Patient reports stable mood and denies depressed or irritable moods.  Patient denies any recent difficulty with anxiety.  Patient denies difficulty with sleep initiation or maintenance. Denies appetite disturbance.  Patient reports that energy and motivation have been good.  Patient denies any difficulty with concentration.  Patient denies any suicidal ideation. Sleeping well than not anxious. Plan: Trintellix 20  07/10/22 TC depression returned recently.  08/15/22 appt  noted: Still blunted  and diminished motivation.  Some days better than others.  Going to gym.  Partially better.   Still has a lot of anxiety and doesn't want to be alone. Functioning but not normal.  Dreads parties.   SE tremor for a couple of week.sleep great.  Dep 7/10. Current Vraylar 1.5 mg daily, increase lamotrigine 200 mg daily, Trintellix 20 , clonazepam 0.5 mg HS Plan: Reduce Clonazepam 1/2  mg HS. This resolved insomnia without SE Continue trintellix 20 mg daily only mildly effective. lamotrigine continue 200 mg daily.Jerrilyn Cairo DT lost response Auvelity Stop Vraylar After Thanksgiving stop Trintellix Wait 3 days then [employer] 1 in the morning for 1 week,  and if no side effects then increase Auvelity to 1 in the AM and 1 in the PM  09/04/22 TC: Complaining of persistent depression and wanting to do something else to help.  Added lithium CR 300 mg nightly  09/11/2022 phone call complaining of no improvement with Auvelity and lithium.  Appointment was moved up.  09/13/22 appt noted: Nothing changed. No better and no worse.  Awakens sad and tearful.  Sleeps well. Pushing herself to the gym.  Has a trainer. On clonazepam 0.5 mg HS She remains anxious when alone for no apparent reason.  Feelings of inferiority.  She is normally extroverted but now she is wanting to isolate from people.  All tasks seem monumental.  No enjoyment and not looking forward to anything.  Everything is a Personal assistant.  She remains generally worried and anxious which is not typical for her. Plan: Clonazepam 1/2  mg HS. This resolved insomnia without SE Stop Auvelity Start nortriptyline 1 of the 25 mg capsules at night for 4 nights then 2 at night for 4 nights then 3 at night, then wait 1 week and get the blood test in the morning at [facility] Reduce lamotrigine to 1 and 1/2 tablets daily  Lamotrigine has not been helpful to him 100 mg daily.  Start reducing lamotrigine 150 mg daily and will continue to  taper at next appointment.  09/27/21 urgent appt :  she wanted urgent appt bc desperate to  feel better.  Seen with H Called at least twice since here for the same reasons of depression. Not sleeping well even with clonazepam 1 mg HS. Doesn't want to be alone. Shakey.   On lithium 300 mg daily, nortriptyline 75 mg HS.   Doesn't want to live like this but not acutely suicidal. Tolerating meds. Plan: pursue [facility] protocol TMS at [facility] bc faster optin than alternatives  10/30/22 TC: finished 50 TMS tx in the week and wants appt ASAP bc still struggling with crying and depresssion..  11/03/22 urgent appt : Completed TMS last week. Tue-Thur better days and then Friday nose-dived. Not done well since got back.   H notes high anxiety and crying in AM and PM and doesn't want to be alone.  Xanxax seems to help. Xanax helps and taking 0.5 mg AM and 1 mg HS H notes memory is sharper and she's more alert after treatment. Very anxioius all the time.  Get up sad.  Exercising. Sleep good with Xanax 1 mg HS. Plan: Increase Lexapro 20 mg dailly (should help crying and maybe anxiety but unlikely to resolve depression) Increase alprazolam to 0.5 mg TID and 1 mg HS for severe anxiety. H notes it helps. Pramipexole 0.25 BID for 5 days and if NR then 0.5 mg Bid off label. Reduce lamotrigine 100 mg for 2 weeks then 50 mg daily for 2 weeks then stop it. She wants to pursue Spravato if pramipexole fails  11/17/22 emergency work in appt: Pramipexole was not sent in and MD not aware until yesterday.  She is not any better and is desperate for a med change. Sleeping well and taking Xanax 1 mg HS Mornings are worse.  Not crying much.  Last Friday was a bad day and she didn't want to be alone.  But did let her H play golf once this week. Reduced lamotrigine today to 50 BID and weaning off. H says better this week than last week. Increased Lexapro to 20 mg daily. Plan: Increased Lexapro 20 mg dailly (helped crying  and maybe anxiety but unlikely to resolve depression) Continue alprazolam to 0.5 mg TID prn anxiety and 1 mg HS. H notes it helps. Pramipexole 0.25 mg BID for 3 days then 0.5 mg BID Reduce lamotrigine 50 mg daily for 2 weeks then stop it. She wants to pursue Spravato if pramipexole fails  12/25/22 appt urgently. COMPLETED 1 WEEK TMS [facility] without benefit except TMS took away brain fog.   She feels need to take Xanax 0.5 mg TID  Some sleepiness with Xanax . Mornings are better and fine while at the gym but when home gets depressed again.   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 Needs Xanax to do social things. Plan: Increased Lexapro 20 mg dailly (helped crying and maybe anxiety but unlikely to resolve depression) Continue alprazolam to 0.5 mg TID prn anxiety and 1 mg HS. H notes it helps. Change pramipexole 0.5mg  tablets, 1 tablet four times daily for 1 week,  Then if not better 1and 1/2 tablets in the AM and dinner and 1 tablet at lunch and dinner.    4/9-4/15 hosp for ruptured appendix and missed meds: Can restart Lexapro at 1/2 tablet daily for 3 days then 1 daily. Restart 1/2 pramipexole tablet 3 times daily for 3 days then 1 tablet 3 times daily for 3 days then 1 and 1/2 tablet 3 times daily.      01/12/23 appt noted: No GI sx now.  No appetite but  working on protein.   I think I'm doing real well mentally.  Has resumed meds.  Mood has been great. Pramipexole 0.5mg   1 and 1/2 TID Lexapro 20 Xanax reduced to 1 mg HS H sees a difference.   No SE with meds.   No med changes  01/25/23 appt noted: Meds:  pramipexole 0.75 mg TID, Xanax 1.5 mg HS and 0.5 mg prn, Lexapro 20 mg daily. Doing great.  Staples removed and healing from surgery appendectomy. Back in the gym helps her.   Satisfied with meds. Getting out socially and feeling like her old self.  Andrea [name]'s H Brad went to ER vomiting blood.   Plan: no med changes  02/07/23 appt noted: Back from [facility] yesterday.  Had ear infection.   Then conjunctivitis.  Eyes hurt.   Before the surgery was getting better and feels like she is sliding back some.  Gradually a little worse since here.  Wonders if she could get better with joy and social drive.   Off Abx .  Sleep is good with meds.  With depression then gets anxious too.  Is functioning.   Meds:  pramipexole 0.75 mg TID, Xanax 1.5 mg HS and 0.5 mg prn, Lexapro 20 mg daily. Consistent and no SE. Plan: Continue Lexapro 20 mg dailly (helped crying and maybe anxiety but unlikely to resolve depression) Continue alprazolam  1 mg HS. H notes it helps. Increase pramipexole 0.5mg  tablets, 2 tablets 3 times daily  01/31/23 TC:Franchot Erichsen, CMA  to Me     02/20/23  4:56 PM Note Patient reporting she is not feeling as good as she thinks she should. Her anxiety is the biggest concern. She said she can go to the gym in the mornings and does ok, but she had a closing to go to recently and had to take 1/4 of a Xanax, reports it took the edge off. She reports daily tremors that are helped by Xanax. She is not crying, she is getting things done, sleeping okay. Will have her grandchildren next week and she isn't excited about it like she should be.    Taking 6 mirapex, 1.5 Xanax and Lexapro 20 mg.     MD resp:  CC   02/20/23  5:48 PM Note We just increased the pramipexole recently and I see her next week.  I do not want to increase the medicine any further until I see her again.      03/02/23 urgent appt noted: Some improvement with incr pramipexole 1 mg TID. Before felt worse in AM now feels better in AM and goes to gym. Otherwise no excitement.  Hard to make decisions. Less social and smiling.  Subdued more than normal.  Not confident.  Not enjoying present and ruminates in past.   No SE.  No impulsivity Has tremor for awhile .  Is mild for a month. Hard to conc.  Brain fog was better with TMS but is back. Continue Lexapro 20 mg dailly (helped crying and maybe anxiety but unlikely  to resolve depression) Continue alprazolam  1 mg HS. H notes it helps. Increase pramipexole 1 mg QID times daily  03/16/23 appt noted: Meds as above SE: If not enough food makes her excitable, urgent, talking fast.  Does it a bit too much.  Energy fine.   Feels better in the AM at the gym and when home a dark shadow comes over her.   Still feels dep overall.  Went to work with friends and  didn't feel natural and didn't enjoy it like she should.  Pushes herself to stay active.   Takes 1.5 mg Xanax HS and then awakens and takes another 0.5 mg HS but gets enough sleep. A lot of brain fog.  Feels inferior and doesn't usually feel that way. Infrequent crying spells. Plan:  Continue Lexapro 20 mg dailly (helped crying and maybe anxiety but unlikely to resolve depression) Continue alprazolam  1 mg HS. H notes it helps. Reduce pramipexole 1 mg TID for 3 days, then 0.5 mg QID for 3 days, then 0.5 mg BID for 3 days, then 0.25 mg BID for 3 days, then stop it. Now start Latuda (lurasidone) 40 mg 1/2 tablet for 1 week.  If not benefit then 1 tablet daily with 350 calories  04/13/23 urgent appt note: with [facility] 04/05/23 for dep with SI "Worst I've ever been".  Feels out of control.  Shaking .  Can't sleep.  Feels disconnected.   Hated being hosp bc locked up.   Taking quetiapine 100 hS and lorazepam and still can't sleep. Started sertraline 50 and stopped Lexapro. Quetiapine 100 mg HS Plan: Stop hydroxyzine Increase quetiapine 200 then 300 mg at night for sleep and depression  Stop lorazepam and return to alprazolam 1 mg at night for sleep Increase sertraline to 100 mg daily or 2 of 50 mg nightly  04/23/23 appt noted: Here for first appt with Spravato.  Highly anxious about getting Spravato today.  Doesn't like feeling out of control and worries over this. Still very depressed.  Doesn't think her sx as noted are due to depression.  Thinks she has a neuro problem.  But she has no other neuro sx like  numbness, weakness, balance px, or anything other than dep and anxiety sx. Received Spravato 56 mg today.  "It was horrible!" Bc she felt out of control and was anxious receiving it.  Thought it would last forever re: dissociation.  It was scary to her.  Couldn't relax for it.  No N, V, HA.  Ambivalent about continuing Spravato but open. No specific concerns with meds.    04/25/23 appt noted: Needed Xanax before visit today to overcome fear of coming to the appt. She was more disciplined about controlling neg thought during Spravato admin and had a much better experience.  It was not scary nor associated with sig fear.  She is still dep and anxious without change otherwise since seen a couple of days ago. Tolerating med changes from last week.   Received Spravato 56 mg again and tolerated it without adverse SE.  Typical SE dissociation, no NV, palpitations, HA.  Motivated to continue the Spravato and agrees to increase it.  Better experience than the first time. Plan: Increase quetiapine to 400 mg HS  04/26/23 TC:  Patient taking 300 mg of Seroquel and 1 mg of Xanax for sleep. She can get to sleep, just can't stay asleep. Doesn't say how long she is sleeping, but says she needs 9 hours of sleep. Reporting social isolation is bad, she won't leave the house, but doesn't like being home alone.  Reports she has never been this sick mentally and feels like she may need to go to the hospital. No SI, just doesn't like being alone.     MD resp:  Pt just seen yesterday and received 2nd Spravato 56 mg .  That was positive experience. However she is still severely depressed and ruminating and reassurance seeking..  She doesn't need to  go to the hospital. She told me yesterday she slept 7 hours but needs 9 hours.  7 hours is not bad.   She is taking quetiapne 300 mg HS with Xanax 1 mg HS.  No increase in Xanax but can increase quetiapine to 400 mg HS (max dose 800) and this could help sleep and anxiety and  depression. She should keep appt next week for next Spravato.  In case she asks, Spravato is not making her worse in any way.     05/01/23 appt noted;H present also Current meds; quetiapine 400 mg HS, sertraline 100 mg daily, alprazolam 0.5 mg TID and 1 mg HS. Tolerating med changes.    Received Spravato 84 mg first time.  Typical SE dissociation, no NV, palpitations, HA.  However she had a bad experience DT severe anxiety before starting the Spravato.   Very fearful, "scared".  Without specific reasons.  Severe anxiety dep, hopeless.  No SI but doesn't want to live like this.  So anxious hard to sleep and some crying spells. Seroquel not helping sleep.  05/03/23 appt noted: Current meds; quetiapine 400 mg HS, sertraline 100 mg daily, alprazolam 0.5 mg TID and 1 mg HS. Tolerating med changes.    Received Spravato 84 mg.  Typical SE dissociation, no NV, palpitations, HA.  However she had a bad experience DT severe anxiety before starting the Spravato.   Highly anxious, fearful of "everything" including Spravato admin.  Afraid she will forget who she is despite having it before.  Still anxious and trouble sleeping at home.  Sleep no better with quetiapine and still ruminatiing.   Plan increase quetiapine to 500 mg HS  05/08/23 appt noted: Meds: quetiapine 500 mg HS, sertraline 100 mg daily, alprazolam 0.5 mg TID and 1 mg HS. Received Spravato 84 mg 3rd time.  Typical SE dissociation, no NV, palpitations, HA.  However she had a bad experience DT severe anxiety before starting the Spravato.   She is not better so far.  Ongoing severe depression anxiety, avoidance, anhedonia, rumination, afraid to be alone. All not typical of herself.   No SE with meds except constipation and sweating at night.  Plan:  Increase quetiapine to 600 mg at night for sleep and depression & rumination and next week to 600 HS increased alprazolam 1 mg at night for sleep and 0.5 mg TID Increase sertraline 150 mg daily   Spravato 84 mg AM twice weekly for severe TRD  05/10/23 appt noted:  Meds: quetiapine 600 mg HS, sertraline 150 mg daily, alprazolam 0.5 mg TID and 1 mg HS. Received Spravato 84 mg 4th time.  Typical SE dissociation, no NV, palpitations, HA.  However she had a bad experience DT severe anxiety before starting the Spravato.   She is not better so far.  Ongoing severe depression anxiety, avoidance, anhedonia, rumination, afraid to be alone. All not typical of herself.   SE constipation she finds unmanageable  Did sleep better with increased quetiapine 600 mg Hs but cannot continue dT constipation.  However dep and anxiety are no better with Spravato or the other treatments except better sleep.  Anxiety ongoing severe with rumination.  05/17/23 appt noted:  Meds: switched to olanzapine 15 mg HS, sertraline 150, alprazolam  Tremor seems worse but H disagrees. Mood continually worse.  She is dependent on H and afraid to be alone.  No sweating. Xanax helped but wears off after a couple of hours.  She is open to antoher option Tolerating med  changes. No improvement except slept a little better last night but still not enough. No Spravato today bc 2 weeks of it didn't help.  Agrees to pursue ECT but hoping med changes will help Crying spells.  07/10/23 appt noted: Great.  [name] back at work.  Playing golf again.   Med: sertraline 200, propranolol 10 BID, olanzapine 15 pm, clonazepam 1 mg HS Did not require ECT.  All of a sudden something clicked and gradually better .  Doesn't even recognize the person she was with depression.   No negative thoughts.  Laughing again.  More social and talking again.  Happy in the am.  Sleep very well 10-11 hours.  Everything back to normal .  Doesn't need [name] with her now.  Ok being alone.  Anxiety gone also. SE carb craving and wt gain 10#.   Plan: Propranolol 10 mg tablet 2-3 tablets twice daily as needed for tremor Continue clonazepam 1 mg tablet 1/2 twice  daily and 1 tablet at night Continue sertraline 200 tablets daily. Switch olanzapine 15 to Lybalvi 10 DT wt gain.    08/10/23 appt noted: Psych med: sertraline 200, propranolol 10 BID, Lybalvi 10 pm, clonazepam 1 mg HS Up to 141# with Lybalvi.  Went to wt reducing clinic and got shot semaglutide.  $75/ week.  That took appetite away.   Was a little more down after a couple of missed doses of Lybalvi. Mood had been great  until the missed dose.  Sleeping a lot.  No problems with change from olanzapine 15 to Lybalvi 10-10 daily but no benefit with appetite reduction.   Party tomorrow night.  Got house decorated yesterday.   Since appendectomy swollen though abdomen.  Abd protruding.   Plan: continue meds except switch back to olanzapine 10  08/27/23 TC:  Takiera reports that she has been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

## 2023-12-06 ENCOUNTER — Other Ambulatory Visit: Payer: Self-pay | Admitting: Psychiatry

## 2023-12-06 DIAGNOSIS — F411 Generalized anxiety disorder: Secondary | ICD-10-CM

## 2023-12-06 DIAGNOSIS — R251 Tremor, unspecified: Secondary | ICD-10-CM

## 2023-12-10 NOTE — Telephone Encounter (Signed)
 Pt has now changed to generic Concerta 54 mg.

## 2023-12-12 DIAGNOSIS — R350 Frequency of micturition: Secondary | ICD-10-CM | POA: Diagnosis not present

## 2023-12-12 DIAGNOSIS — N390 Urinary tract infection, site not specified: Secondary | ICD-10-CM | POA: Diagnosis not present

## 2023-12-13 ENCOUNTER — Telehealth: Payer: Self-pay

## 2023-12-13 NOTE — Telephone Encounter (Signed)
 Prior Authorization initiated with [facility] for Methylphenidate ER 54 MG, pt was given 1 month supply until PA approval.

## 2023-12-14 NOTE — Telephone Encounter (Signed)
 Prior approval received effective 12/13/23-09/24/2098 with [facility] Medicare Part D for Methylphenidate ER 54 mg

## 2023-12-15 NOTE — Telephone Encounter (Signed)
PA approved see phone message

## 2023-12-20 ENCOUNTER — Ambulatory Visit: Payer: Medicare Other | Admitting: Clinical

## 2023-12-20 DIAGNOSIS — F3341 Major depressive disorder, recurrent, in partial remission: Secondary | ICD-10-CM | POA: Diagnosis not present

## 2023-12-20 DIAGNOSIS — F419 Anxiety disorder, unspecified: Secondary | ICD-10-CM
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
## 2023-12-20 NOTE — Progress Notes (Signed)
   Andrea Barthel, LCSW

## 2023-12-21 DIAGNOSIS — N390 Urinary tract infection, site not specified: Secondary | ICD-10-CM | POA: Diagnosis not present

## 2023-12-27 ENCOUNTER — Other Ambulatory Visit: Payer: Self-pay | Admitting: Psychiatry

## 2023-12-27 DIAGNOSIS — F411 Generalized anxiety disorder: Secondary | ICD-10-CM

## 2023-12-27 DIAGNOSIS — F339 Major depressive disorder, recurrent, unspecified: Secondary | ICD-10-CM

## 2023-12-31 ENCOUNTER — Telehealth: Payer: Self-pay | Admitting: Psychiatry

## 2023-12-31 NOTE — Telephone Encounter (Signed)
 Andrea [name] called at 10:41 to request refill of her Ritalin.  Appt 4/11.  Send to  [id number] [place], [place] - [address]   She knows it not due today but wants it to be at the pharmacy when it is due.

## 2023-12-31 NOTE — Telephone Encounter (Signed)
 LF 3/12, due 4/9 - is now taking Concerta 54 mg, not Ritalin

## 2024-01-02 ENCOUNTER — Other Ambulatory Visit: Payer: Self-pay

## 2024-01-02 DIAGNOSIS — F339 Major depressive disorder, recurrent, unspecified: Secondary | ICD-10-CM

## 2024-01-02 MED ORDER — METHYLPHENIDATE HCL ER (OSM) 54 MG PO TBCR: 54.0000 mg | EXTENDED_RELEASE_TABLET | ORAL | 0 refills | Status: DC

## 2024-01-02 NOTE — Telephone Encounter (Signed)
 Pended Concerta 54 mg.

## 2024-01-03 ENCOUNTER — Other Ambulatory Visit: Payer: Self-pay | Admitting: Psychiatry

## 2024-01-03 DIAGNOSIS — R251 Tremor, unspecified: Secondary | ICD-10-CM

## 2024-01-03 DIAGNOSIS — F411 Generalized anxiety disorder: Secondary | ICD-10-CM

## 2024-01-04 ENCOUNTER — Encounter: Payer: Self-pay | Admitting: Psychiatry

## 2024-01-04 ENCOUNTER — Ambulatory Visit: Admitting: Psychiatry

## 2024-01-04 DIAGNOSIS — F411 Generalized anxiety disorder: Secondary | ICD-10-CM

## 2024-01-04 DIAGNOSIS — F5105 Insomnia due to other mental disorder: Secondary | ICD-10-CM | POA: Diagnosis not present

## 2024-01-04 DIAGNOSIS — F339 Major depressive disorder, recurrent, unspecified: Secondary | ICD-10-CM | POA: Diagnosis not present

## 2024-01-04 MED ORDER — QUETIAPINE FUMARATE ER 50 MG PO TB24: ORAL_TABLET | ORAL | 0 refills | Status: DC

## 2024-01-04 NOTE — Patient Instructions (Addendum)
 Stop olanzapine Start quetiapine ER 50 mg tablet 3 hours before bedtime, 1 at night for 1 night, 2 at night for 1 night, then 3 at night Wait a few days and if sleeping better then reduce clonazepam to 3/4 tablet at night. 2-4 stool softeners daily with Miralax daily

## 2024-01-04 NOTE — Progress Notes (Signed)
 Andrea [name] [id number] Apr 01, 1951 73 y.o.   Subjective:   Patient ID:  Andrea [name] is a 73 y.o. (DOB 07-31-1951) female.  Chief Complaint:  Chief Complaint  Patient presents with   Follow-up   Depression   Medication Reaction    HPI Andrea [name] presents to the office today for follow-up of MDE and anxiety disorder.  seen 09/2019.  No meds were changed.  Been on Lexapro 20 since early 2017.  02/27/2020 phone call from patient: Pt experiencing more anxiety than she normally has. Pt would like to know what are some options that she has as far as her medication. Pt will be in an appt  MD response: If the anxiety is just occasional then using alprazolam as needed would be an okay thing to do.  If it is been fairly persistent for a couple of weeks or more than the simplest solution would be to increase the Lexapro to 1-1/2 tablets daily.  Historically she has done well on Lexapro 20 mg daily for an extended period of time.  If she is under temporary stress then once that is resolved we could drop it back to 1 daily.  If this does not work as expected then she should schedule an earlier appointment. Pt response: Patient called back and she's been having temporary stress, but it's not bad. Things are great, she's working out and feeling okay for the most part. It's been going on for a bit so she agrees to the increase Lexapro 20 mg 1.5 tablets daily. She has been taking alprazolam at hs occasionally to help sleep better and it does help.   12/06/2020 appointment with the following noted:  No covid.  H Covid and recovered.   Had increased Lexapro to 30 mg for 90 days and saw a difference but started seeing weight gain and was doing oK and able to drop back and been OK. Still renovating a house for a year.  Sold her house and mourns the house. Calmed down.  Kept 3 gkids and dogs 10 days.   Andrea [name] Hopes for new house May 1. Rare alprazolam for HS. Stress moving to condo and would have crying spell  over the move and some problems she was running into dealing with it.  Hard with change.  But taking time to adjust to the idea of moving from her house of 23 years.  Initially refused but he left it up to her and she's come to peace about it.  Andrea [name].   Good response to medication and no SE.  Exercising is good medicine.  If doesn't then doesn't feel as good. Satisfied. Plan: no med changes  08/01/21  appt noted:  seen with D Andrea [name] died Jul 25, 2024.  A lot of ups and downs per D.  Hard dips. Got worse when retired and dealing with the new  house.  Had trouble getting rid of things from the old house causing regrets over sellling the house.  D notes doesn't do well with instability  and stressors.  Ruminates on past mistakes. Blamed Andrea [name] for the  difficulty renovating the house.  Neg self talk and beats herself up. When not enough sleep will get depressed.  Does better if busy.   Dreads things.  Patient denies difficulty with sleep initiation or maintenance. Denies appetite disturbance.  Patient reports that energy and motivation have been good. Patient denies any difficulty with concentration.  Patient denies any suicidal ideation.  Plan: Abilify 2.5  mg daily for a week, then 5 mg daily. Continue Lexapro 20 mg daily  08/31/2021 appt noted: It worked immediately.  Insomnia with EMA. Normally 9 hours.  It's like I'm high.  Not tired.  Average 3-4 hours. Not depressed and no crazy negative thoughts.  Good response.  Energetic but up in middle of night. Plan: Okay to stop Abilify because of insomnia.   09/29/2021 appointment with the following noted: Parties were great.  Sleep problem resolved.  A lot of rest at the beach. Maybe the last week or 2 less motivation and socialization.  Not laying in the bed.  Just not as good as she was.  Not as outgoing as normal. No alcohol now.  Only had 1-2 at night.  Not ruminating.   Willing to restart Abilify at lower dose 2 mg daily. 3 grandsons  Plan:  Relapse depression recurs she can resume Abilify  but lower dose 2 mg daily or every other day to try to avoid the insomnia where she can contact our office and we can discussed the possibility of an alternative antidepressant such as Trintellix or duloxetine.  12/28/2021 appointment noted: Several phone calls since she was here.  Abilify plus Lexapro failed.  Switch to duloxetine 90 mg which she started 12/02/2018 2023 Started lamotrigine. Also started Rexulti Sister has had cycling depression that has responded well to lamotrigine with poor response to SSRIs [name] with a little dementia. SE tremor 90 mg duloxetine, ? Wt gain over a few weeks.. Exercise and wt control Social isolation, low self esteem, worry about the way she looks and not usually that way. Crying better with duloxetine.  More dependent on H.   Anhedonia.  Black hole.  Never this low. No SI Having to use Xanax.   Asks about day treatment and TMS Plan: rec TMS Continue lamotrigine as prescribed Increase Rexulti to 1 mg daily Switch to Trintellix:  reduce duloxetine to 2 of the 30 mg capsules and start Trintellix 5 mg daily for 5 days,  Then Increase Trintellix to 10 mg daily and reduce duloxetine to 1 capsule for 1 week. Then stop duloxetine.  02/15/22 appt noted: On Trintellix , Rexulti 1 and lamotrigine I feel good.  Walks 3 miles daily is great for mental health. TMS would not be covered by [facility]. Started feeling better after a week or 2 after the last visit. SE appetite increased. No nausea. Not crying anymore. Sleeping well and no longer ruminates. Plan: Continue Trintellix but DC Rexulti due to weight gain.  Continue lamotrigine  03/06/2022 appointment with the following noted: Last 2 weeks has been very sad and nervous. Trintellix was increased to 20 mg daily  03/10/2022 complaining of ongoing depression, feeling jittery, negative thinking, early morning awakening, ruminating about past decisions.  Wanting to consider  TMS because she is desperate for improvement. Plan we will schedule urgent appointment  03/15/22 urgent appt noted:  seen with Andrea [name] Very depressed, worst ever.  Don't want to be alone, ongoing worry, ruminating on past decisions.  Racing negative thoughts.  No motivation.  Trouble staying asleep.  Getting 4 hours and used to 9 hours. Dread getting up. Using Xanax Increased Trintellix to 20 mg 9 days ago. H agrees sleep is critical. Isolating.   Need to be better by July 11. Plan: Clonazepam 1 mg HS. Continue trintellix 20 mg daily she is only been on this dose 9 days and it needs more time. Re lamotrigine and increase to 150 mg  daily..    Vraylar 1.5 mg every other day Option TMS  03/24/22 appt noted: So much better.  Thinking straighter and not negative and not sad.  Sleeping better 8-9 hours.. No crying. H notices progressively better every day.  Mind clarity is much better.   No SE noted.  No hangover.  No nausea. Found a therapist, Krystal Eaton.   Feels well enough to go to [place] and kind of excited.  Back July 16.   Plan: Clonazepam 1 mg HS. This resolved insomnia without SE Continue trintellix 20 mg daily she is only been on this dose 20 days and it needs more time. Re lamotriginecontinue 150 mg daily.Leafy Kindle 1.5 mg every other day Option TMS  04/12/22 appt noted: Great.  Went to [place].  Was herself and enjoyed it.  Anxiety was managed.   [facility] and liked her. 3# wt gain.   Patient reports stable mood and denies depressed or irritable moods.  Patient denies any recent difficulty with anxiety.  Patient denies difficulty with sleep initiation or maintenance. Denies appetite disturbance.  Patient reports that energy and motivation have been good.  Patient denies any difficulty with concentration.  Patient denies any suicidal ideation. Sleeping well than not anxious. Plan: Trintellix 20  07/10/22 TC depression returned recently.  08/15/22  appt noted: Still blunted  and diminished motivation.  Some days better than others.  Going to gym.  Partially better.   Still has a lot of anxiety and doesn't want to be alone. Functioning but not normal.  Dreads parties.   SE tremor for a couple of week.sleep great.  Dep 7/10. Current Vraylar 1.5 mg daily, increase lamotrigine 200 mg daily, Trintellix 20 , clonazepam 0.5 mg HS Plan: Reduce Clonazepam 1/2  mg HS. This resolved insomnia without SE Continue trintellix 20 mg daily only mildly effective. lamotrigine continue 200 mg daily.Jerrilyn Cairo DT lost response Auvelity Stop Vraylar After Thanksgiving stop Trintellix Wait 3 days then [employer] 1 in the morning for 1 week,  and if no side effects then increase Auvelity to 1 in the AM and 1 in the PM  09/04/22 TC: Complaining of persistent depression and wanting to do something else to help.  Added lithium CR 300 mg nightly  09/11/2022 phone call complaining of no improvement with Auvelity and lithium.  Appointment was moved up.  09/13/22 appt noted: Nothing changed. No better and no worse.  Awakens sad and tearful.  Sleeps well. Pushing herself to the gym.  Has a trainer. On clonazepam 0.5 mg HS She remains anxious when alone for no apparent reason.  Feelings of inferiority.  She is normally extroverted but now she is wanting to isolate from people.  All tasks seem monumental.  No enjoyment and not looking forward to anything.  Everything is a Personal assistant.  She remains generally worried and anxious which is not typical for her. Plan: Clonazepam 1/2  mg HS. This resolved insomnia without SE Stop Auvelity Start nortriptyline 1 of the 25 mg capsules at night for 4 nights then 2 at night for 4 nights then 3 at night, then wait 1 week and get the blood test in the morning at [facility] Reduce lamotrigine to 1 and 1/2 tablets daily  Lamotrigine has not been helpful to him 100 mg daily.  Start reducing lamotrigine 150 mg daily and will continue  to taper at next appointment.  09/27/21 urgent appt :  she wanted urgent  appt bc desperate to feel better.  Seen with H Called at least twice since here for the same reasons of depression. Not sleeping well even with clonazepam 1 mg HS. Doesn't want to be alone. Shakey.   On lithium 300 mg daily, nortriptyline 75 mg HS.   Doesn't want to live like this but not acutely suicidal. Tolerating meds. Plan: pursue [facility] protocol TMS at [facility] bc faster optin than alternatives  10/30/22 TC: finished 50 TMS tx in the week and wants appt ASAP bc still struggling with crying and depresssion..  11/03/22 urgent appt : Completed TMS last week. Tue-Thur better days and then Friday nose-dived. Not done well since got back.   H notes high anxiety and crying in AM and PM and doesn't want to be alone.  Xanxax seems to help. Xanax helps and taking 0.5 mg AM and 1 mg HS H notes memory is sharper and she's more alert after treatment. Very anxioius all the time.  Get up sad.  Exercising. Sleep good with Xanax 1 mg HS. Plan: Increase Lexapro 20 mg dailly (should help crying and maybe anxiety but unlikely to resolve depression) Increase alprazolam to 0.5 mg TID and 1 mg HS for severe anxiety. H notes it helps. Pramipexole 0.25 BID for 5 days and if NR then 0.5 mg Bid off label. Reduce lamotrigine 100 mg for 2 weeks then 50 mg daily for 2 weeks then stop it. She wants to pursue Spravato if pramipexole fails  11/17/22 emergency work in appt: Pramipexole was not sent in and MD not aware until yesterday.  She is not any better and is desperate for a med change. Sleeping well and taking Xanax 1 mg HS Mornings are worse.  Not crying much.  Last Friday was a bad day and she didn't want to be alone.  But did let her H play golf once this week. Reduced lamotrigine today to 50 BID and weaning off. H says better this week than last week. Increased Lexapro to 20 mg daily. Plan: Increased Lexapro 20 mg dailly (helped  crying and maybe anxiety but unlikely to resolve depression) Continue alprazolam to 0.5 mg TID prn anxiety and 1 mg HS. H notes it helps. Pramipexole 0.25 mg BID for 3 days then 0.5 mg BID Reduce lamotrigine 50 mg daily for 2 weeks then stop it. She wants to pursue Spravato if pramipexole fails  12/25/22 appt urgently. COMPLETED 1 WEEK TMS [facility] without benefit except TMS took away brain fog.   She feels need to take Xanax 0.5 mg TID  Some sleepiness with Xanax . Mornings are better and fine while at the gym but when home gets depressed again.   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 Needs Xanax to do social things. Plan: Increased Lexapro 20 mg dailly (helped crying and maybe anxiety but unlikely to resolve depression) Continue alprazolam to 0.5 mg TID prn anxiety and 1 mg HS. H notes it helps. Change pramipexole 0.5mg  tablets, 1 tablet four times daily for 1 week,  Then if not better 1and 1/2 tablets in the AM and dinner and 1 tablet at lunch and dinner.    4/9-4/15 hosp for ruptured appendix and missed meds: Can restart Lexapro at 1/2 tablet daily for 3 days then 1 daily. Restart 1/2 pramipexole tablet 3 times daily for 3 days then 1 tablet 3 times daily for 3 days then 1 and 1/2 tablet 3 times daily.      01/12/23 appt noted: No GI sx now.  No appetite but working on protein.   I think I'm doing real well mentally.  Has resumed meds.  Mood has been great. Pramipexole 0.5mg   1 and 1/2 TID Lexapro 20 Xanax reduced to 1 mg HS H sees a difference.   No SE with meds.   No med changes  01/25/23 appt noted: Meds:  pramipexole 0.75 mg TID, Xanax 1.5 mg HS and 0.5 mg prn, Lexapro 20 mg daily. Doing great.  Staples removed and healing from surgery appendectomy. Back in the gym helps her.   Satisfied with meds. Getting out socially and feeling like her old self.  Andrea's H Brad went to ER vomiting blood.   Plan: no med changes  02/07/23 appt noted: Back from [facility] yesterday.  Had ear  infection.  Then conjunctivitis.  Eyes hurt.   Before the surgery was getting better and feels like she is sliding back some.  Gradually a little worse since here.  Wonders if she could get better with joy and social drive.   Off Abx .  Sleep is good with meds.  With depression then gets anxious too.  Is functioning.   Meds:  pramipexole 0.75 mg TID, Xanax 1.5 mg HS and 0.5 mg prn, Lexapro 20 mg daily. Consistent and no SE. Plan: Continue Lexapro 20 mg dailly (helped crying and maybe anxiety but unlikely to resolve depression) Continue alprazolam  1 mg HS. H notes it helps. Increase pramipexole 0.5mg  tablets, 2 tablets 3 times daily  01/31/23 TC:Franchot Erichsen, CMA  to Me     02/20/23  4:56 PM Note Patient reporting she is not feeling as good as she thinks she should. Her anxiety is the biggest concern. She said she can go to the gym in the mornings and does ok, but she had a closing to go to recently and had to take 1/4 of a Xanax, reports it took the edge off. She reports daily tremors that are helped by Xanax. She is not crying, she is getting things done, sleeping okay. Will have her grandchildren next week and she isn't excited about it like she should be.    Taking 6 mirapex, 1.5 Xanax and Lexapro 20 mg.     MD resp:  CC   02/20/23  5:48 PM Note We just increased the pramipexole recently and I see her next week.  I do not want to increase the medicine any further until I see her again.      03/02/23 urgent appt noted: Some improvement with incr pramipexole 1 mg TID. Before felt worse in AM now feels better in AM and goes to gym. Otherwise no excitement.  Hard to make decisions. Less social and smiling.  Subdued more than normal.  Not confident.  Not enjoying present and ruminates in past.   No SE.  No impulsivity Has tremor for awhile .  Is mild for a month. Hard to conc.  Brain fog was better with TMS but is back. Continue Lexapro 20 mg dailly (helped crying and maybe anxiety but  unlikely to resolve depression) Continue alprazolam  1 mg HS. H notes it helps. Increase pramipexole 1 mg QID times daily  03/16/23 appt noted: Meds as above SE: If not enough food makes her excitable, urgent, talking fast.  Does it a bit too much.  Energy fine.   Feels better in the AM at the gym and when home a dark shadow comes over her.   Still feels dep overall.  Went to work  with friends and didn't feel natural and didn't enjoy it like she should.  Pushes herself to stay active.   Takes 1.5 mg Xanax HS and then awakens and takes another 0.5 mg HS but gets enough sleep. A lot of brain fog.  Feels inferior and doesn't usually feel that way. Infrequent crying spells. Plan:  Continue Lexapro 20 mg dailly (helped crying and maybe anxiety but unlikely to resolve depression) Continue alprazolam  1 mg HS. H notes it helps. Reduce pramipexole 1 mg TID for 3 days, then 0.5 mg QID for 3 days, then 0.5 mg BID for 3 days, then 0.25 mg BID for 3 days, then stop it. Now start Latuda (lurasidone) 40 mg 1/2 tablet for 1 week.  If not benefit then 1 tablet daily with 350 calories  04/13/23 urgent appt note: with [facility] 04/05/23 for dep with SI "Worst I've ever been".  Feels out of control.  Shaking .  Can't sleep.  Feels disconnected.   Hated being hosp bc locked up.   Taking quetiapine 100 hS and lorazepam and still can't sleep. Started sertraline 50 and stopped Lexapro. Quetiapine 100 mg HS Plan: Stop hydroxyzine Increase quetiapine 200 then 300 mg at night for sleep and depression  Stop lorazepam and return to alprazolam 1 mg at night for sleep Increase sertraline to 100 mg daily or 2 of 50 mg nightly  04/23/23 appt noted: Here for first appt with Spravato.  Highly anxious about getting Spravato today.  Doesn't like feeling out of control and worries over this. Still very depressed.  Doesn't think her sx as noted are due to depression.  Thinks she has a neuro problem.  But she has no other neuro sx  like numbness, weakness, balance px, or anything other than dep and anxiety sx. Received Spravato 56 mg today.  "It was horrible!" Bc she felt out of control and was anxious receiving it.  Thought it would last forever re: dissociation.  It was scary to her.  Couldn't relax for it.  No N, V, HA.  Ambivalent about continuing Spravato but open. No specific concerns with meds.    04/25/23 appt noted: Needed Xanax before visit today to overcome fear of coming to the appt. She was more disciplined about controlling neg thought during Spravato admin and had a much better experience.  It was not scary nor associated with sig fear.  She is still dep and anxious without change otherwise since seen a couple of days ago. Tolerating med changes from last week.   Received Spravato 56 mg again and tolerated it without adverse SE.  Typical SE dissociation, no NV, palpitations, HA.  Motivated to continue the Spravato and agrees to increase it.  Better experience than the first time. Plan: Increase quetiapine to 400 mg HS  04/26/23 TC:  Patient taking 300 mg of Seroquel and 1 mg of Xanax for sleep. She can get to sleep, just can't stay asleep. Doesn't say how long she is sleeping, but says she needs 9 hours of sleep. Reporting social isolation is bad, she won't leave the house, but doesn't like being home alone.  Reports she has never been this sick mentally and feels like she may need to go to the hospital. No SI, just doesn't like being alone.     MD resp:  Pt just seen yesterday and received 2nd Spravato 56 mg .  That was positive experience. However she is still severely depressed and ruminating and reassurance seeking.Marland Kitchen  She  doesn't need to go to the hospital. She told me yesterday she slept 7 hours but needs 9 hours.  7 hours is not bad.   She is taking quetiapne 300 mg HS with Xanax 1 mg HS.  No increase in Xanax but can increase quetiapine to 400 mg HS (max dose 800) and this could help sleep and anxiety and  depression. She should keep appt next week for next Spravato.  In case she asks, Spravato is not making her worse in any way.     05/01/23 appt noted;H present also Current meds; quetiapine 400 mg HS, sertraline 100 mg daily, alprazolam 0.5 mg TID and 1 mg HS. Tolerating med changes.    Received Spravato 84 mg first time.  Typical SE dissociation, no NV, palpitations, HA.  However she had a bad experience DT severe anxiety before starting the Spravato.   Very fearful, "scared".  Without specific reasons.  Severe anxiety dep, hopeless.  No SI but doesn't want to live like this.  So anxious hard to sleep and some crying spells. Seroquel not helping sleep.  05/03/23 appt noted: Current meds; quetiapine 400 mg HS, sertraline 100 mg daily, alprazolam 0.5 mg TID and 1 mg HS. Tolerating med changes.    Received Spravato 84 mg.  Typical SE dissociation, no NV, palpitations, HA.  However she had a bad experience DT severe anxiety before starting the Spravato.   Highly anxious, fearful of "everything" including Spravato admin.  Afraid she will forget who she is despite having it before.  Still anxious and trouble sleeping at home.  Sleep no better with quetiapine and still ruminatiing.   Plan increase quetiapine to 500 mg HS  05/08/23 appt noted: Meds: quetiapine 500 mg HS, sertraline 100 mg daily, alprazolam 0.5 mg TID and 1 mg HS. Received Spravato 84 mg 3rd time.  Typical SE dissociation, no NV, palpitations, HA.  However she had a bad experience DT severe anxiety before starting the Spravato.   She is not better so far.  Ongoing severe depression anxiety, avoidance, anhedonia, rumination, afraid to be alone. All not typical of herself.   No SE with meds except constipation and sweating at night.  Plan:  Increase quetiapine to 600 mg at night for sleep and depression & rumination and next week to 600 HS increased alprazolam 1 mg at night for sleep and 0.5 mg TID Increase sertraline 150 mg daily   Spravato 84 mg AM twice weekly for severe TRD  05/10/23 appt noted:  Meds: quetiapine 600 mg HS, sertraline 150 mg daily, alprazolam 0.5 mg TID and 1 mg HS. Received Spravato 84 mg 4th time.  Typical SE dissociation, no NV, palpitations, HA.  However she had a bad experience DT severe anxiety before starting the Spravato.   She is not better so far.  Ongoing severe depression anxiety, avoidance, anhedonia, rumination, afraid to be alone. All not typical of herself.   SE constipation she finds unmanageable  Did sleep better with increased quetiapine 600 mg Hs but cannot continue dT constipation.  However dep and anxiety are no better with Spravato or the other treatments except better sleep.  Anxiety ongoing severe with rumination.  05/17/23 appt noted:  Meds: switched to olanzapine 15 mg HS, sertraline 150, alprazolam  Tremor seems worse but H disagrees. Mood continually worse.  She is dependent on H and afraid to be alone.  No sweating. Xanax helped but wears off after a couple of hours.  She is open to antoher  option Tolerating med changes. No improvement except slept a little better last night but still not enough. No Spravato today bc 2 weeks of it didn't help.  Agrees to pursue ECT but hoping med changes will help Crying spells.  07/10/23 appt noted: Great.  Andrea [name] back at work.  Playing golf again.   Med: sertraline 200, propranolol 10 BID, olanzapine 15 pm, clonazepam 1 mg HS Did not require ECT.  All of a sudden something clicked and gradually better .  Doesn't even recognize the person she was with depression.   No negative thoughts.  Laughing again.  More social and talking again.  Happy in the am.  Sleep very well 10-11 hours.  Everything back to normal .  Doesn't need Andrea [name] with her now.  Ok being alone.  Anxiety gone also. SE carb craving and wt gain 10#.   Plan: Propranolol 10 mg tablet 2-3 tablets twice daily as needed for tremor Continue clonazepam 1 mg tablet 1/2 twice  daily and 1 tablet at night Continue sertraline 200 tablets daily. Switch olanzapine 15 to Lybalvi 10 DT wt gain.    08/10/23 appt noted: Psych med: sertraline 200, propranolol 10 BID, Lybalvi 10 pm, clonazepam 1 mg HS Up to 141# with Lybalvi.  Went to wt reducing clinic and got shot semaglutide.  $75/ week.  That took appetite away.   Was a little more down after a couple of missed doses of Lybalvi. Mood had been great  until the missed dose.  Sleeping a lot.  No problems with change from olanzapine 15 to Lybalvi 10-10 daily but no benefit with appetite reduction.   Party tomorrow night.  Got house decorated yesterday.   Since appendectomy swollen though abdomen.  Abd protruding.   Plan: continue meds except switch back to olanzapine 10  08/27/23 TC:  Faithanne reports that she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

## 2024-01-05 ENCOUNTER — Other Ambulatory Visit: Payer: Self-pay | Admitting: Psychiatry

## 2024-01-05 DIAGNOSIS — F411 Generalized anxiety disorder: Secondary | ICD-10-CM

## 2024-01-05 DIAGNOSIS — R251 Tremor, unspecified: Secondary | ICD-10-CM

## 2024-01-08 ENCOUNTER — Telehealth: Payer: Self-pay | Admitting: Psychiatry

## 2024-01-08 NOTE — Telephone Encounter (Signed)
 Andrea [name] called at 3.34 to report that since seeing on Friday and follow the new medication changes, she reports that she is feeling worse and going back into a deep depression. She know it is soon, but she requests a call back to discuss

## 2024-01-08 NOTE — Telephone Encounter (Signed)
 Pt seen 4/11 with the following changes:   Stop olanzapine Start quetiapine ER 50 mg tablet 3 hours before bedtime, 1 at night for 1 night, 2 at night for 1 night, then 3 at night Wait a few days and if sleeping better then reduce clonazepam to 3/4 tablet at night.  She is now taking 150 mg. She reports sleeping well, but is groggy during the day. Has not tried to cut back on clonazepam. She verbalizes she knows it hasn't been long enough since the changes were made for her to feel better, but feels like she is "nose diving."   Going to the beach this weekend.

## 2024-01-08 NOTE — Telephone Encounter (Signed)
 Push the quetiapine up to 150 mg nightly asap.  She's feeling worse from stopping olanzapine and hasn't had time to benefit from quetiapine bc the dose is no high enough yet for long enough

## 2024-01-09 ENCOUNTER — Telehealth: Payer: Self-pay | Admitting: Psychiatry

## 2024-01-09 NOTE — Telephone Encounter (Signed)
 Called patient again. Told her that she is just going to have to give it some time until she feels better.

## 2024-01-09 NOTE — Telephone Encounter (Signed)
Patient notified of recommendation.

## 2024-01-09 NOTE — Telephone Encounter (Signed)
 Husband called 11:20 reporting pt in a puddle of tears and don't know what to do about it. Med change on Friday. RTC [phone]

## 2024-01-14 ENCOUNTER — Other Ambulatory Visit: Payer: Self-pay | Admitting: Psychiatry

## 2024-01-14 ENCOUNTER — Telehealth: Payer: Self-pay | Admitting: Psychiatry

## 2024-01-14 ENCOUNTER — Ambulatory Visit: Payer: Self-pay

## 2024-01-14 DIAGNOSIS — F339 Major depressive disorder, recurrent, unspecified: Secondary | ICD-10-CM

## 2024-01-14 DIAGNOSIS — F411 Generalized anxiety disorder: Secondary | ICD-10-CM

## 2024-01-14 DIAGNOSIS — F5105 Insomnia due to other mental disorder: Secondary | ICD-10-CM

## 2024-01-14 MED ORDER — QUETIAPINE FUMARATE ER 200 MG PO TB24: 200.0000 mg | ORAL_TABLET | Freq: Every day | ORAL | 0 refills | Status: DC

## 2024-01-14 NOTE — Telephone Encounter (Signed)
 Pt just called. Reporting can't stop crying. Don't want to go outside or be around people. Can't live like this, Pt stated. [phone] RTC

## 2024-01-14 NOTE — Telephone Encounter (Signed)
 Notified patient that new Rx for Seroquel  was for 200 mg so she would just take one tablet. Told her that clonazepam  is not due until 4/27. She said she is supposed to take 3/4 tablet so she may have enough. Told her to notify us  if she wasn't going to have enough clonazepam .

## 2024-01-14 NOTE — Telephone Encounter (Signed)
 New Rx sent for Seroquel . Patient said she is nearly out of clonazepam  but is not due until 4/27.

## 2024-01-14 NOTE — Telephone Encounter (Signed)
 Is she taking 150 mg quetiapine ?  Is she tolerating it?  If so increase to 200 mg now.  We will keep increasing to 300-400 mg nightly if needed.

## 2024-01-14 NOTE — Telephone Encounter (Signed)
 Please see message from patient.  On 4/15 she was told:  Push the quetiapine  up to 150 mg nightly asap. She's feeling worse from stopping olanzapine  and hasn't had time to benefit from quetiapine  bc the dose is no high enough yet for long enough.

## 2024-01-14 NOTE — Telephone Encounter (Signed)
 Copied from CRM [id number]. Topic: Clinical - Red Word Triage >> Jan 14, 2024 11:22 AM Lovett Ruck C wrote: [facility] that prompted transfer to Nurse Triage: patient thinks she has a UTI, she has an appointment for therapy tomorrow and would like to try and get a lab sample tomorrow at same time but I wanted to make sure she spoke with nurse  Chief Complaint: Urinary frequency Symptoms: Flank pain Frequency: 1 week Pertinent Negatives: Patient denies fever Disposition: [] ED /[] Urgent Care (no appt availability in office) / [x] Appointment(In office/virtual)/ []  [facility]/ [] Home Care/ [] Refused Recommended Disposition /[] [place] [facility]/ []  Follow-up with PCP Additional Notes: Patient called in to report urinary symptoms. Patient stated symptoms have been present for about a week. Patient is experiencing urinary frequency and flank pain. Patient denied fever, hematuria and urinary retention. No availability with PCP. Scheduled with alternate provider in office tomorrow. Provided care advice and instructed patient to call back if symptoms worsen. Patient complied.   Reason for Disposition  Side (flank) or lower back pain present  Answer Assessment - Initial Assessment Questions 1. SYMPTOM: "What's the main symptom you're concerned about?" (e.g., frequency, incontinence)     Urinary frequency  2. ONSET: "When did the urinary symptoms start?"     A week ago 3. PAIN: "Is there any pain?" If Yes, ask: "How bad is it?" (Scale: 1-10; mild, moderate, severe)     Rates flank pain a 4-5 4. CAUSE: "What do you think is causing the symptoms?"     UTI 5. OTHER SYMPTOMS: "Do you have any other symptoms?" (e.g., blood in urine, fever, flank pain, pain with urination)     Left flank pain     Denies burning, denies hematuria, states she is still able to pass urine, denies fever  Protocols used: Urinary Symptoms-A-AH

## 2024-01-14 NOTE — Telephone Encounter (Signed)
 Noted. I will evaluate her in office tomorrow

## 2024-01-15 ENCOUNTER — Ambulatory Visit (INDEPENDENT_AMBULATORY_CARE_PROVIDER_SITE_OTHER): Admitting: Nurse Practitioner

## 2024-01-15 ENCOUNTER — Ambulatory Visit (INDEPENDENT_AMBULATORY_CARE_PROVIDER_SITE_OTHER): Admitting: Clinical

## 2024-01-15 VITALS — BP 120/80 | HR 77 | Temp 97.8°F | Ht 62.5 in | Wt 142.0 lb

## 2024-01-15 DIAGNOSIS — F331 Major depressive disorder, recurrent, moderate: Secondary | ICD-10-CM

## 2024-01-15 DIAGNOSIS — F419 Anxiety disorder, unspecified: Secondary | ICD-10-CM | POA: Diagnosis not present

## 2024-01-15 DIAGNOSIS — R350 Frequency of micturition: Secondary | ICD-10-CM | POA: Diagnosis not present

## 2024-01-15 LAB — POC URINALSYSI DIPSTICK (AUTOMATED)
Bilirubin, UA: NEGATIVE
Blood, UA: NEGATIVE
Glucose, UA: NEGATIVE
Ketones, UA: NEGATIVE
Leukocytes, UA: NEGATIVE
Nitrite, UA: NEGATIVE
Protein, UA: NEGATIVE
Spec Grav, UA: 1.01 (ref 1.010–1.025)
Urobilinogen, UA: 0.2 U/dL
pH, UA: 7 (ref 5.0–8.0)

## 2024-01-15 NOTE — Progress Notes (Signed)
 Acute Office Visit  Subjective:     Patient ID: Andrea [name], female    DOB: 07/06/1951, 73 y.o.   MRN: [id number]  Chief Complaint  Patient presents with   Urinary Frequency    X 2 weeks. No other sx.     Patient is in today for urinary frequency with a history of hypothyroidism. Osteopneia. MDD, GAD, HLD.  Symtpoms started approx 2 weeks ago. State that she is going to toliet frequent. States that she doe snot empty out all the way.  States that she has not tried anything otc. States that she drinking a lot of water   Review of Systems  Constitutional:  Negative for chills and fever.  Gastrointestinal:  Negative for abdominal pain, nausea and vomiting.  Genitourinary:  Positive for frequency and urgency. Negative for dysuria and hematuria.  Musculoskeletal:  Positive for back pain.        Objective:    BP 120/80 (BP Location: Left Arm, Patient Position: Sitting, Cuff Size: Normal)   Pulse 77   Temp 97.8 F (36.6 C) (Oral)   Ht 5' 2.5" (1.588 m)   Wt 142 lb (64.4 kg)   SpO2 98%   BMI 25.56 kg/m  BP Readings from Last 3 Encounters:  01/15/24 120/80  10/10/23 (!) 142/86  08/20/23 (!) 155/87   Wt Readings from Last 3 Encounters:  01/15/24 142 lb (64.4 kg)  10/10/23 150 lb (68 kg)  09/21/23 136 lb (61.7 kg)   SpO2 Readings from Last 3 Encounters:  01/15/24 98%  10/10/23 98%  08/20/23 97%      Physical Exam Vitals and nursing note reviewed.  Constitutional:      Appearance: Normal appearance.  Cardiovascular:     Rate and Rhythm: Normal rate and regular rhythm.     Heart sounds: Normal heart sounds.  Pulmonary:     Effort: Pulmonary effort is normal.     Breath sounds: Normal breath sounds.  Abdominal:     General: Bowel sounds are normal. There is no distension.     Palpations: There is no mass.     Tenderness: There is no abdominal tenderness. There is no right CVA tenderness or left CVA tenderness (states she can feel it but does not hurt).      Hernia: No hernia is present.  Neurological:     Mental Status: She is alert.     Results for orders placed or performed in visit on 01/15/24  POCT Urinalysis Dipstick (Automated)  Result Value Ref Range   Color, UA light yellow    Clarity, UA clear    Glucose, UA Negative Negative   Bilirubin, UA neg    Ketones, UA neg    Spec Grav, UA 1.010 1.010 - 1.025   Blood, UA neg    pH, UA 7.0 5.0 - 8.0   Protein, UA Negative Negative   Urobilinogen, UA 0.2 0.2 or 1.0 E.U./dL   Nitrite, UA neg    Leukocytes, UA Negative Negative        Assessment & Plan:   Problem List Items Addressed This Visit       Other   Urinary frequency - Primary   UA in office.       Relevant Orders   POCT Urinalysis Dipstick (Automated) (Completed)   Urine Culture    No orders of the defined types were placed in this encounter.   Return if symptoms worsen or fail to improve.  Margarie Shay, NP

## 2024-01-15 NOTE — Assessment & Plan Note (Signed)
 UA in office

## 2024-01-15 NOTE — Patient Instructions (Signed)
 Nice to see you today I will be in touch with the urine results once I have them

## 2024-01-15 NOTE — Progress Notes (Signed)
 [facility] Counselor/Therapist Progress Note  Patient ID: Andrea [name], MRN: [id number]    Date: 01/15/24  Time Spent: 2:33  pm - 3:15 pm : 42 Minutes  Treatment Type: Individual Therapy.  Reported Symptoms: depressed mood, crying, isolation, decreased appetite, loss of interest  Mental Status Exam: Appearance:  Neat and Well Groomed     Behavior: Appropriate  Motor: Normal  Speech/Language:  Clear and Coherent and Normal Rate  Affect: Flat  Mood: depressed  Thought process: normal  Thought content:   WNL  Sensory/Perceptual disturbances:   WNL  Orientation: oriented to person, place, and situation  Attention: Good  Concentration: Good  Memory: WNL  Fund of knowledge:  Good  Insight:   Good  Judgment:  Good  Impulse Control: Good   Risk Assessment: Danger to Self:  No Patient denied current suicidal ideation  Self-injurious Behavior: No Danger to Others: No Patient denied current homicidal ideation Duty to Warn:no Physical Aggression / Violence:No  Access to Firearms a concern: No  Gang Involvement:No   Subjective:  Patient stated, "I'm having a difficult time with my depression, I went back to the doctor and he has upped my medicine". Patient stated, "I have attachment issues and I don't want him (husband) to leave me". Patient reported the following symptoms: depressed mood, crying, stated "not wanting to go out or see anybody", loss of interest, decreased energy, loss of motivation, fatigue, decreased appetite. Patient reported an increase in anxiety and reported feeling scared, frightened and lonely when husband is not present. Patient reported symptoms started two weeks ago and have been consistent since onset. Patient reported a recent appointment with patient's psychiatrist. Patient reported psychiatrist discontinued olanzapine  and prescribed Seroquel  in the mornings. Patient's husband stated, "she (patient) hasn't been herself in quite some time". Patient  reported she dreads social commitments and reported she tries to avoid attending social commitments. Patient reported symptoms have increased since change in medications. Patient reported medications were changed approximately two weeks ago. Patient reported she has a follow up appointment with psychiatrist, Dr. Toi Foster, in early may. Patient stated, "I'll show up if I can" in response to recommendation for therapy. Patient's husband stated, "she's never embraced therapy". Patient stated, "I want to get well".   Interventions: Motivational Interviewing. Clinician conducted session in person at clinician's office at [facility]. Patient's husband was present during today's session at patient's request and patient provided verbal consent for husband to be present during today's session. Reviewed events since last session and assessed for changes. Clinician reviewed diagnoses and treatment recommendations. Provided psycho education related to diagnoses and treatment. Provided psycho education related to use of CBT and behavioral activation in treatment.   Collaboration of Care: Psychiatrist AEB Discussed consent for patient's psychiatrist, Dr. Nori Beat   Patient/Guardian was advised Release of Information must be obtained prior to any record release in order to collaborate their care with an outside provider. Patient/Guardian was advised if they have not already done so to contact [facility] to sign all necessary forms in order for us  to release information regarding their care.    Diagnosis:  Major depressive disorder, recurrent episode, moderate (HCC)  Anxiety disorder, unspecified type   Plan: Goals to be determined during patient's follow up appointment on Feb 21, 2024.                    Burlene Carpen, LCSW

## 2024-01-16 ENCOUNTER — Encounter: Payer: Self-pay | Admitting: Nurse Practitioner

## 2024-01-16 ENCOUNTER — Other Ambulatory Visit: Payer: Self-pay | Admitting: Nurse Practitioner

## 2024-01-16 ENCOUNTER — Telehealth: Payer: Self-pay

## 2024-01-16 LAB — URINE CULTURE
MICRO NUMBER:: 16359504
Result:: NO GROWTH
SPECIMEN QUALITY:: ADEQUATE

## 2024-01-16 MED ORDER — CEPHALEXIN 500 MG PO CAPS: 500.0000 mg | ORAL_CAPSULE | Freq: Two times a day (BID) | ORAL | 0 refills | Status: DC

## 2024-01-16 NOTE — Telephone Encounter (Signed)
 Called patient reviewed all information and repeated back to me.  Advised her of script sent in to treat symptoms.  Pt made aware of culture results pending and let her know that she will be notified with the results of the culture.  Pt verbalized understanding and has no other questions.  Pt is concerned of lower back pain that just started today.

## 2024-01-16 NOTE — Telephone Encounter (Signed)
 Continue with the keflex  and I will make changes as indicated on the culture

## 2024-01-16 NOTE — Telephone Encounter (Signed)
 Copied from CRM [id number]. Topic: Clinical - Lab/Test Results >> Jan 16, 2024  9:12 AM Earnestine Goes B wrote: Reason for CRM: pt called to follow up on lab results. Requesting a call back at  [phone] >> Jan 16, 2024  9:19 AM CMA Kaylyn C wrote: Wrong office

## 2024-01-17 ENCOUNTER — Telehealth: Payer: Self-pay | Admitting: Psychiatry

## 2024-01-17 NOTE — Telephone Encounter (Signed)
 Andrea [name] (03-07-2051) has a question about her medicines. Please call her when you have a chance at [phone]. She asked specifically for you.

## 2024-01-17 NOTE — Telephone Encounter (Addendum)
 Pt reports she doesn't feel better, feels worse on the increased Seroquel  (200 mg). I told her a new medication was like getting a new pair of shoes, that it would take a while to break them in and unfortunately it would take time for the medication to work. She then asks if cutting back on the clonazepam  could be contributing. Reports when she gets up in the morning is the worst. She doesn't want to do anything, "I'm a prisoner in my own house." I told her that Dr. Toi Foster said she could increase dose and she is asking to do that. She is asking if she should increase the clonazepam  back to a full tablet.

## 2024-01-18 NOTE — Telephone Encounter (Signed)
 The clonazepam  doesn't have anything to do with her depression.  If she's not sleeping or highly anxious upon awakening she could increase the clonazepam  but I'd rather she didn't bc again we need to focus on the depression.  Seroquel  needs more time and might need to go higher.  In the study the ideal dose was 300 mg .  She can go ahead and increase it or wait until I get back in town.

## 2024-01-18 NOTE — Telephone Encounter (Signed)
 Patient notified about clonazepam  and said she would just continue the 3/4 tablet as instructed. She said she did go to the gym today and I told her that was progress.

## 2024-01-20 ENCOUNTER — Other Ambulatory Visit: Payer: Self-pay | Admitting: Psychiatry

## 2024-01-20 DIAGNOSIS — F339 Major depressive disorder, recurrent, unspecified: Secondary | ICD-10-CM

## 2024-01-20 NOTE — Telephone Encounter (Signed)
?   If still taking

## 2024-01-21 ENCOUNTER — Telehealth: Payer: Self-pay | Admitting: Psychiatry

## 2024-01-21 ENCOUNTER — Other Ambulatory Visit: Payer: Self-pay | Admitting: Psychiatry

## 2024-01-21 DIAGNOSIS — F5105 Insomnia due to other mental disorder: Secondary | ICD-10-CM

## 2024-01-21 DIAGNOSIS — F411 Generalized anxiety disorder: Secondary | ICD-10-CM

## 2024-01-21 DIAGNOSIS — K642 Third degree hemorrhoids: Secondary | ICD-10-CM | POA: Diagnosis not present

## 2024-01-21 MED ORDER — QUETIAPINE FUMARATE ER 300 MG PO TB24: 300.0000 mg | ORAL_TABLET | Freq: Every day | ORAL | 0 refills | Status: DC

## 2024-01-21 MED ORDER — CLONAZEPAM 1 MG PO TABS
1.0000 mg | ORAL_TABLET | Freq: Every day | ORAL | 0 refills | Status: AC
Start: 2024-01-21 — End: ?

## 2024-01-21 NOTE — Telephone Encounter (Signed)
 The do not make a Seroquel  XR 100.  I went ahead and send an Seroquel  XR 300 mg take 1 at bedtime.  She can hang on to the 200 mg tablets because we may indeed go to 400 mg if she does not start seeing some improvement at 300 mg.  It is likely to take a couple of weeks unfortunately to help.  The typical dose for depression is around 300 mg

## 2024-01-21 NOTE — Telephone Encounter (Signed)
 Patient notified of new Rx for Seroquel  300 XL. Told her it is likely to take a couple of weeks. She said it was tough.

## 2024-01-21 NOTE — Telephone Encounter (Signed)
 Pt reporting no improvement in sx. She is currently taking one 200 mg XL Seroquel . You said she can go up to 300 mg. ? If Rx for 100 mg XL be sent in anticipation of having to increase to 400 mg.

## 2024-01-21 NOTE — Telephone Encounter (Signed)
 Connie Deist called and LM at 9:10 saying it was an urgent call to Andrea [name] to report that she had a bad weekend.  The medications are not working at all.  Please call to discuss.  Next appt 6/18.

## 2024-01-23 ENCOUNTER — Telehealth: Payer: Self-pay

## 2024-01-23 NOTE — Telephone Encounter (Signed)
 Copied and sent to Dr. Toi Foster as a TC.

## 2024-01-23 NOTE — Telephone Encounter (Signed)
 From MyChart  This is Andrea [name]'s daughter [name]. She is back where she was last summer. she is scared to leave her house or be alone and cries all day. She has irrational thoughts and fears.   We had looked into it last year when it had gotten this bad but we have a call with [facility] on Thursday morning about her going to a 30 day program. She obviously values Dr. Ramonita Burow opinions so we would love if he gave his recommendation to her to go as well as she is struggling with going. Myself, her husband and her sister who are all currently taking care of her while she is in this state of mind all believe she needs this.

## 2024-01-24 ENCOUNTER — Telehealth: Payer: Self-pay | Admitting: Psychiatry

## 2024-01-24 NOTE — Telephone Encounter (Signed)
 Pt called 10:00 requesting RTC from [facility] concerning meds [phone]

## 2024-01-24 NOTE — Telephone Encounter (Signed)
 Pt said she has had a really bad week, sheer panic, not wanting to be alone, and crying all the time. She said dtr and H are pushing her to go to [facility]. She talked with someone there and they told her they had worked with you before. She said she is not a fan of going away for 30 days, had a lot of things going on, but did want to get well. She knows it is too early for the Seroquel  to be effective. She reported a new therapist who she isn't fond of. She said it she goes she wants you to control her meds. I told her that we are usually hands off while hospitalized, but they could consult with you. She wants your take on going to [facility]

## 2024-01-24 NOTE — Telephone Encounter (Signed)
 I'll call her tomorrow, Friday 01/24/24

## 2024-01-25 ENCOUNTER — Other Ambulatory Visit (INDEPENDENT_AMBULATORY_CARE_PROVIDER_SITE_OTHER): Payer: Self-pay | Admitting: Psychiatry

## 2024-01-25 DIAGNOSIS — F339 Major depressive disorder, recurrent, unspecified: Secondary | ICD-10-CM | POA: Diagnosis not present

## 2024-01-25 DIAGNOSIS — Z13228 Encounter for screening for other metabolic disorders: Secondary | ICD-10-CM | POA: Diagnosis not present

## 2024-01-25 DIAGNOSIS — E559 Vitamin D deficiency, unspecified: Secondary | ICD-10-CM | POA: Diagnosis not present

## 2024-01-25 DIAGNOSIS — F411 Generalized anxiety disorder: Secondary | ICD-10-CM

## 2024-01-25 DIAGNOSIS — E039 Hypothyroidism, unspecified: Secondary | ICD-10-CM | POA: Diagnosis not present

## 2024-01-25 DIAGNOSIS — Z131 Encounter for screening for diabetes mellitus: Secondary | ICD-10-CM | POA: Diagnosis not present

## 2024-01-25 DIAGNOSIS — Z13220 Encounter for screening for lipoid disorders: Secondary | ICD-10-CM | POA: Diagnosis not present

## 2024-01-25 DIAGNOSIS — Z130 Encounter for screening for diseases of the blood and blood-forming organs and certain disorders involving the immune mechanism: Secondary | ICD-10-CM | POA: Diagnosis not present

## 2024-01-25 MED ORDER — METHYLPHENIDATE HCL ER (OSM) 54 MG PO TBCR: 54.0000 mg | EXTENDED_RELEASE_TABLET | ORAL | 0 refills | Status: DC

## 2024-01-25 NOTE — Progress Notes (Signed)
 Andrea [name] [id number] Jan 21, 1951 73 y.o.  Virtual Visit via Telephone Note  I connected with pt by telephone and verified that I am speaking with the correct person using two identifiers.   I discussed the limitations, risks, security and privacy concerns of performing an evaluation and management service by telephone and the availability of in person appointments. I also discussed with the patient that there may be a patient responsible charge related to this service. The patient expressed understanding and agreed to proceed.  I discussed the assessment and treatment plan with the patient. The patient was provided an opportunity to ask questions and all were answered. The patient agreed with the plan and demonstrated an understanding of the instructions.   The patient was advised to call back or seek an in-person evaluation if the symptoms worsen or if the condition fails to improve as anticipated.  I provided 30 minutes of non-face-to-face time during this encounter. The call started at 1130 and ended at 1200. The patient was located at home and the provider was located office.   Subjective:   Patient ID:  Andrea [name] is a 73 y.o. (DOB 09/08/1951) female.  Chief Complaint:  No chief complaint on file.   HPI Andrea [name] presents to the office today for follow-up of MDE and anxiety disorder.  seen 09/2019.  No meds were changed.  Been on Lexapro  20 since early 2017.  02/27/2020 phone call from patient: Pt experiencing more anxiety than she normally has. Pt would like to know what are some options that she has as far as her medication. Pt will be in an appt  MD response: If the anxiety is just occasional then using alprazolam  as needed would be an okay thing to do.  If it is been fairly persistent for a couple of weeks or more than the simplest solution would be to increase the Lexapro  to 1-1/2 tablets daily.  Historically she has done well on Lexapro  20 mg daily for an extended period of  time.  If she is under temporary stress then once that is resolved we could drop it back to 1 daily.  If this does not work as expected then she should schedule an earlier appointment. Pt response: Patient called back and she's been having temporary stress, but it's not bad. Things are great, she's working out and feeling okay for the most part. It's been going on for a bit so she agrees to the increase Lexapro  20 mg 1.5 tablets daily. She has been taking alprazolam  at hs occasionally to help sleep better and it does help.   12/06/2020 appointment with the following noted:  No covid.  H Covid and recovered.   Had increased Lexapro  to 30 mg for 90 days and saw a difference but started seeing weight gain and was doing oK and able to drop back and been OK. Still renovating a house for a year.  Sold her house and mourns the house. Calmed down.  Kept 3 gkids and dogs 10 days.   Andrea [name] Hopes for new house May 1. Rare alprazolam  for HS. Stress moving to condo and would have crying spell over the move and some problems she was running into dealing with it.  Hard with change.  But taking time to adjust to the idea of moving from her house of 23 years.  Initially refused but he left it up to her and she's come to peace about it.  Andrea [name].   Good response to  medication and no SE.  Exercising is good medicine.  If doesn't then doesn't feel as good. Satisfied. Plan: no med changes  08/01/21  appt noted:  seen with D Andrea [name] died Aug 05, 2024.  A lot of ups and downs per D.  Hard dips. Got worse when retired and dealing with the new  house.  Had trouble getting rid of things from the old house causing regrets over sellling the house.  D notes doesn't do well with instability  and stressors.  Ruminates on past mistakes. Blamed Andrea [name] for the  difficulty renovating the house.  Neg self talk and beats herself up. When not enough sleep will get depressed.  Does better if busy.   Dreads things.  Patient denies  difficulty with sleep initiation or maintenance. Denies appetite disturbance.  Patient reports that energy and motivation have been good. Patient denies any difficulty with concentration.  Patient denies any suicidal ideation.  Plan: Abilify  2.5 mg daily for a week, then 5 mg daily. Continue Lexapro  20 mg daily  08/31/2021 appt noted: It worked immediately.  Insomnia with EMA. Normally 9 hours.  It's like I'm high.  Not tired.  Average 3-4 hours. Not depressed and no crazy negative thoughts.  Good response.  Energetic but up in middle of night. Plan: Okay to stop Abilify  because of insomnia.   09/29/2021 appointment with the following noted: Parties were great.  Sleep problem resolved.  A lot of rest at the beach. Maybe the last week or 2 less motivation and socialization.  Not laying in the bed.  Just not as good as she was.  Not as outgoing as normal. No alcohol now.  Only had 1-2 at night.  Not ruminating.   Willing to restart Abilify  at lower dose 2 mg daily. 3 grandsons  Plan: Relapse depression recurs she can resume Abilify   but lower dose 2 mg daily or every other day to try to avoid the insomnia where she can contact our office and we can discussed the possibility of an alternative antidepressant such as Trintellix  or duloxetine .  12/28/2021 appointment noted: Several phone calls since she was here.  Abilify  plus Lexapro  failed.  Switch to duloxetine  90 mg which she started 12/02/2018 2023 Started lamotrigine . Also started Rexulti  Sister has had cycling depression that has responded well to lamotrigine  with poor response to SSRIs [name] with a little dementia. SE tremor 90 mg duloxetine , ? Wt gain over a few weeks.. Exercise and wt control Social isolation, low self esteem, worry about the way she looks and not usually that way. Crying better with duloxetine .  More dependent on H.   Anhedonia.  Black hole.  Never this low. No SI Having to use Xanax .   Asks about day treatment and  TMS Plan: rec TMS Continue lamotrigine  as prescribed Increase Rexulti  to 1 mg daily Switch to Trintellix :  reduce duloxetine  to 2 of the 30 mg capsules and start Trintellix  5 mg daily for 5 days,  Then Increase Trintellix  to 10 mg daily and reduce duloxetine  to 1 capsule for 1 week. Then stop duloxetine .  02/15/22 appt noted: On Trintellix  , Rexulti  1 and lamotrigine  I feel good.  Walks 3 miles daily is great for mental health. TMS would not be covered by [facility]. Started feeling better after a week or 2 after the last visit. SE appetite increased. No nausea. Not crying anymore. Sleeping well and no longer ruminates. Plan: Continue Trintellix  but DC Rexulti  due to weight gain.  Continue  lamotrigine   03/06/2022 appointment with the following noted: Last 2 weeks has been very sad and nervous. Trintellix  was increased to 20 mg daily  03/10/2022 complaining of ongoing depression, feeling jittery, negative thinking, early morning awakening, ruminating about past decisions.  Wanting to consider TMS because she is desperate for improvement. Plan we will schedule urgent appointment  03/15/22 urgent appt noted:  seen with Andrea [name] Very depressed, worst ever.  Don't want to be alone, ongoing worry, ruminating on past decisions.  Racing negative thoughts.  No motivation.  Trouble staying asleep.  Getting 4 hours and used to 9 hours. Dread getting up. Using Xanax  Increased Trintellix  to 20 mg 9 days ago. H agrees sleep is critical. Isolating.   Need to be better by July 11. Plan: Clonazepam  1 mg HS. Continue trintellix  20 mg daily she is only been on this dose 9 days and it needs more time. Re lamotrigine  and increase to 150 mg daily..    Vraylar  1.5 mg every other day Option TMS  03/24/22 appt noted: So much better.  Thinking straighter and not negative and not sad.  Sleeping better 8-9 hours.. No crying. H notices progressively better every day.  Mind clarity is much better.   No SE noted.  No  hangover.  No nausea. Found a therapist, Andrea [name].   Feels well enough to go to [place] and kind of excited.  Back July 16.   Plan: Clonazepam  1 mg HS. This resolved insomnia without SE Continue trintellix  20 mg daily she is only been on this dose 20 days and it needs more time. Re lamotriginecontinue 150 mg daily..    Vraylar  1.5 mg every other day Option TMS  04/12/22 appt noted: Great.  Went to [place].  Was herself and enjoyed it.  Anxiety was managed.   [name] and liked her. 3# wt gain.   Patient reports stable mood and denies depressed or irritable moods.  Patient denies any recent difficulty with anxiety.  Patient denies difficulty with sleep initiation or maintenance. Denies appetite disturbance.  Patient reports that energy and motivation have been good.  Patient denies any difficulty with concentration.  Patient denies any suicidal ideation. Sleeping well than not anxious. Plan: Trintellix  20  07/10/22 TC depression returned recently.  08/15/22 appt noted: Still blunted  and diminished motivation.  Some days better than others.  Going to gym.  Partially better.   Still has a lot of anxiety and doesn't want to be alone. Functioning but not normal.  Dreads parties.   SE tremor for a couple of week.sleep great.  Dep 7/10. Current Vraylar  1.5 mg daily, increase lamotrigine  200 mg daily, Trintellix  20 , clonazepam  0.5 mg HS Plan: Reduce Clonazepam  1/2  mg HS. This resolved insomnia without SE Continue trintellix  20 mg daily only mildly effective. lamotrigine  continue 200 mg daily..    DC Vraylar  DT lost response Auvelity  Stop Vraylar  After Thanksgiving stop Trintellix  Wait 3 days then Start Auvelity  1 in the morning for 1 week,  and if no side effects then increase Auvelity  to 1 in the AM and 1 in the PM  09/04/22 TC: Complaining of persistent depression and wanting to do something else to help.  Added lithium  CR 300 mg nightly  09/11/2022 phone  call complaining of no improvement with Auvelity  and lithium .  Appointment was moved up.  09/13/22 appt noted: Nothing changed. No better and no worse.  Awakens sad and tearful.  Sleeps well. Pushing herself to the  gym.  Has a trainer. On clonazepam  0.5 mg HS She remains anxious when alone for no apparent reason.  Feelings of inferiority.  She is normally extroverted but now she is wanting to isolate from people.  All tasks seem monumental.  No enjoyment and not looking forward to anything.  Everything is a Personal assistant.  She remains generally worried and anxious which is not typical for her. Plan: Clonazepam  1/2  mg HS. This resolved insomnia without SE Stop Auvelity  Start nortriptyline  1 of the 25 mg capsules at night for 4 nights then 2 at night for 4 nights then 3 at night, then wait 1 week and get the blood test in the morning at [facility] Reduce lamotrigine  to 1 and 1/2 tablets daily  Lamotrigine  has not been helpful to him 100 mg daily.  Start reducing lamotrigine  150 mg daily and will continue to taper at next appointment.  09/27/21 urgent appt :  she wanted urgent appt bc desperate to feel better.  Seen with H Called at least twice since here for the same reasons of depression. Not sleeping well even with clonazepam  1 mg HS. Doesn't want to be alone. Shakey.   On lithium  300 mg daily, nortriptyline  75 mg HS.   Doesn't want to live like this but not acutely suicidal. Tolerating meds. Plan: pursue [facility] protocol [facility] at [name] bc faster optin than alternatives  10/30/22 TC: finished 50 TMS tx in the week and wants appt ASAP bc still struggling with crying and depresssion..  11/03/22 urgent appt : Completed TMS last week. Tue-Thur better days and then Friday nose-dived. Not done well since got back.   H notes high anxiety and crying in AM and PM and doesn't want to be alone.  Xanxax seems to help. Xanax  helps and taking 0.5 mg AM and 1 mg HS H notes memory is sharper and she's more alert after  treatment. Very anxioius all the time.  Get up sad.  Exercising. Sleep good with Xanax  1 mg HS. Plan: Increase Lexapro  20 mg dailly (should help crying and maybe anxiety but unlikely to resolve depression) Increase alprazolam  to 0.5 mg TID and 1 mg HS for severe anxiety. H notes it helps. Pramipexole  0.25 BID for 5 days and if NR then 0.5 mg Bid off label. Reduce lamotrigine  100 mg for 2 weeks then 50 mg daily for 2 weeks then stop it. She wants to pursue Spravato  if pramipexole  fails  11/17/22 emergency work in appt: Pramipexole  was not sent in and MD not aware until yesterday.  She is not any better and is desperate for a med change. Sleeping well and taking Xanax  1 mg HS Mornings are worse.  Not crying much.  Last Friday was a bad day and she didn't want to be alone.  But did let her H play golf once this week. Reduced lamotrigine  today to 50 BID and weaning off. H says better this week than last week. Increased Lexapro  to 20 mg daily. Plan: Increased Lexapro  20 mg dailly (helped crying and maybe anxiety but unlikely to resolve depression) Continue alprazolam  to 0.5 mg TID prn anxiety and 1 mg HS. H notes it helps. Pramipexole  0.25 mg BID for 3 days then 0.5 mg BID Reduce lamotrigine  50 mg daily for 2 weeks then stop it. She wants to pursue Spravato  if pramipexole  fails  12/25/22 appt urgently. COMPLETED 1 WEEK TMS [facility] without benefit except TMS took away brain fog.   She feels need to take Xanax  0.5 mg  TID  Some sleepiness with Xanax  . Mornings are better and fine while at the gym but when home gets depressed again.   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 Needs Xanax  to do social things. Plan: Increased Lexapro  20 mg dailly (helped crying and maybe anxiety but unlikely to resolve depression) Continue alprazolam  to 0.5 mg TID prn anxiety and 1 mg HS. H notes it helps. Change pramipexole  0.5mg  tablets, 1 tablet four times daily for 1 week,  Then if not better 1and 1/2 tablets in the AM  and dinner and 1 tablet at lunch and dinner.    4/9-4/15 hosp for ruptured appendix and missed meds: Can restart Lexapro  at 1/2 tablet daily for 3 days then 1 daily. Restart 1/2 pramipexole  tablet 3 times daily for 3 days then 1 tablet 3 times daily for 3 days then 1 and 1/2 tablet 3 times daily.      01/12/23 appt noted: No GI sx now.  No appetite but working on protein.   I think I'm doing real well mentally.  Has resumed meds.  Mood has been great. Pramipexole  0.5mg   1 and 1/2 TID Lexapro  20 Xanax  reduced to 1 mg HS H sees a difference.   No SE with meds.   No med changes  01/25/23 appt noted: Meds:  pramipexole  0.75 mg TID, Xanax  1.5 mg HS and 0.5 mg prn, Lexapro  20 mg daily. Doing great.  Staples removed and healing from surgery appendectomy. Back in the gym helps her.   Satisfied with meds. Getting out socially and feeling like her old self.  Andrea's H Andrea [name] went to ER vomiting blood.   Plan: no med changes  02/07/23 appt noted: Back from [name] yesterday.  Had ear infection.  Then conjunctivitis.  Eyes hurt.   Before the surgery was getting better and feels like she is sliding back some.  Gradually a little worse since here.  Wonders if she could get better with joy and social drive.   Off Abx .  Sleep is good with meds.  With depression then gets anxious too.  Is functioning.   Meds:  pramipexole  0.75 mg TID, Xanax  1.5 mg HS and 0.5 mg prn, Lexapro  20 mg daily. Consistent and no SE. Plan: Continue Lexapro  20 mg dailly (helped crying and maybe anxiety but unlikely to resolve depression) Continue alprazolam   1 mg HS. H notes it helps. Increase pramipexole  0.5mg  tablets, 2 tablets 3 times daily  01/31/23 TC:Andrea [name], Andrea [name]  to Me     02/20/23  4:56 PM Note Patient reporting she is not feeling as good as she thinks she should. Her anxiety is the biggest concern. She said she can go to the gym in the mornings and does ok, but she had a closing to go to recently and had to  take 1/4 of a Xanax , reports it took the edge off. She reports daily tremors that are helped by Xanax . She is not crying, she is getting things done, sleeping okay. Will have her grandchildren next week and she isn't excited about it like she should be.    Taking 6 mirapex , 1.5 Xanax  and Lexapro  20 mg.     MD resp:  CC   02/20/23  5:48 PM Note We just increased the pramipexole  recently and I see her next week.  I do not want to increase the medicine any further until I see her again.      03/02/23 urgent appt noted: Some improvement with incr pramipexole  1  mg TID. Before felt worse in AM now feels better in AM and goes to gym. Otherwise no excitement.  Hard to make decisions. Less social and smiling.  Subdued more than normal.  Not confident.  Not enjoying present and ruminates in past.   No SE.  No impulsivity Has tremor for awhile .  Is mild for a month. Hard to conc.  Brain fog was better with TMS but is back. Continue Lexapro  20 mg dailly (helped crying and maybe anxiety but unlikely to resolve depression) Continue alprazolam   1 mg HS. H notes it helps. Increase pramipexole  1 mg QID times daily  03/16/23 appt noted: Meds as above SE: If not enough food makes her excitable, urgent, talking fast.  Does it a bit too much.  Energy fine.   Feels better in the AM at the gym and when home a dark shadow comes over her.   Still feels dep overall.  Went to work with friends and didn't feel natural and didn't enjoy it like she should.  Pushes herself to stay active.   Takes 1.5 mg Xanax  HS and then awakens and takes another 0.5 mg HS but gets enough sleep. A lot of brain fog.  Feels inferior and doesn't usually feel that way. Infrequent crying spells. Plan:  Continue Lexapro  20 mg dailly (helped crying and maybe anxiety but unlikely to resolve depression) Continue alprazolam   1 mg HS. H notes it helps. Reduce pramipexole  1 mg TID for 3 days, then 0.5 mg QID for 3 days, then 0.5 mg BID for 3  days, then 0.25 mg BID for 3 days, then stop it. Now start Latuda  (lurasidone ) 40 mg 1/2 tablet for 1 week.  If not benefit then 1 tablet daily with 350 calories  04/13/23 urgent appt note: with Andrea [name] 04/05/23 for dep with SI "Worst I've ever been".  Feels out of control.  Shaking .  Can't sleep.  Feels disconnected.   Hated being hosp bc locked up.   Taking quetiapine  100 hS and lorazepam and still can't sleep. Started sertraline  50 and stopped Lexapro . Quetiapine  100 mg HS Plan: Stop hydroxyzine Increase quetiapine  200 then 300 mg at night for sleep and depression  Stop lorazepam and return to alprazolam  1 mg at night for sleep Increase sertraline  to 100 mg daily or 2 of 50 mg nightly  04/23/23 appt noted: Here for first appt with Spravato .  Highly anxious about getting Spravato  today.  Doesn't like feeling out of control and worries over this. Still very depressed.  Doesn't think her sx as noted are due to depression.  Thinks she has a neuro problem.  But she has no other neuro sx like numbness, weakness, balance px, or anything other than dep and anxiety sx. Received Spravato  56 mg today.  "It was horrible!" Bc she felt out of control and was anxious receiving it.  Thought it would last forever re: dissociation.  It was scary to her.  Couldn't relax for it.  No N, V, HA.  Ambivalent about continuing Spravato  but open. No specific concerns with meds.    04/25/23 appt noted: Needed Xanax  before visit today to overcome fear of coming to the appt. She was more disciplined about controlling neg thought during Spravato  admin and had a much better experience.  It was not scary nor associated with sig fear.  She is still dep and anxious without change otherwise since seen a couple of days ago. Tolerating med changes from last week.  Received Spravato  56 mg again and tolerated it without adverse SE.  Typical SE dissociation, no NV, palpitations, HA.  Motivated to continue the Spravato  and agrees to  increase it.  Better experience than the first time. Plan: Increase quetiapine  to 400 mg HS  04/26/23 TC:  Patient taking 300 mg of Seroquel  and 1 mg of Xanax  for sleep. She can get to sleep, just can't stay asleep. Doesn't say how long she is sleeping, but says she needs 9 hours of sleep. Reporting social isolation is bad, she won't leave the house, but doesn't like being home alone.  Reports she has never been this sick mentally and feels like she may need to go to the hospital. No SI, just doesn't like being alone.     MD resp:  Pt just seen yesterday and received 2nd Spravato  56 mg .  That was positive experience. However she is still severely depressed and ruminating and reassurance seeking..  She doesn't need to go to the hospital. She told me yesterday she slept 7 hours but needs 9 hours.  7 hours is not bad.   She is taking quetiapne 300 mg HS with Xanax  1 mg HS.  No increase in Xanax  but can increase quetiapine  to 400 mg HS (max dose 800) and this could help sleep and anxiety and depression. She should keep appt next week for next Spravato .  In case she asks, Spravato  is not making her worse in any way.     05/01/23 appt noted;H present also Current meds; quetiapine  400 mg HS, sertraline  100 mg daily, alprazolam  0.5 mg TID and 1 mg HS. Tolerating med changes.    Received Spravato  84 mg first time.  Typical SE dissociation, no NV, palpitations, HA.  However she had a bad experience DT severe anxiety before starting the Spravato .   Very fearful, "scared".  Without specific reasons.  Severe anxiety dep, hopeless.  No SI but doesn't want to live like this.  So anxious hard to sleep and some crying spells. Seroquel  not helping sleep.  05/03/23 appt noted: Current meds; quetiapine  400 mg HS, sertraline  100 mg daily, alprazolam  0.5 mg TID and 1 mg HS. Tolerating med changes.    Received Spravato  84 mg.  Typical SE dissociation, no NV, palpitations, HA.  However she had a bad experience DT severe  anxiety before starting the Spravato .   Highly anxious, fearful of "everything" including Spravato  admin.  Afraid she will forget who she is despite having it before.  Still anxious and trouble sleeping at home.  Sleep no better with quetiapine  and still ruminatiing.   Plan increase quetiapine  to 500 mg HS  05/08/23 appt noted: Meds: quetiapine  500 mg HS, sertraline  100 mg daily, alprazolam  0.5 mg TID and 1 mg HS. Received Spravato  84 mg 3rd time.  Typical SE dissociation, no NV, palpitations, HA.  However she had a bad experience DT severe anxiety before starting the Spravato .   She is not better so far.  Ongoing severe depression anxiety, avoidance, anhedonia, rumination, afraid to be alone. All not typical of herself.   No SE with meds except constipation and sweating at night.  Plan:  Increase quetiapine  to 600 mg at night for sleep and depression & rumination and next week to 600 HS increased alprazolam  1 mg at night for sleep and 0.5 mg TID Increase sertraline  150 mg daily  Spravato  84 mg AM twice weekly for severe TRD  05/10/23 appt noted:  Meds: quetiapine  600 mg HS, sertraline   150 mg daily, alprazolam  0.5 mg TID and 1 mg HS. Received Spravato  84 mg 4th time.  Typical SE dissociation, no NV, palpitations, HA.  However she had a bad experience DT severe anxiety before starting the Spravato .   She is not better so far.  Ongoing severe depression anxiety, avoidance, anhedonia, rumination, afraid to be alone. All not typical of herself.   SE constipation she finds unmanageable  Did sleep better with increased quetiapine  600 mg Hs but cannot continue dT constipation.  However dep and anxiety are no better with Spravato  or the other treatments except better sleep.  Anxiety ongoing severe with rumination.  05/17/23 appt noted:  Meds: switched to olanzapine  15 mg HS, sertraline  150, alprazolam   Tremor seems worse but H disagrees. Mood continually worse.  She is dependent on H and afraid to be  alone.  No sweating. Xanax  helped but wears off after a couple of hours.  She is open to antoher option Tolerating med changes. No improvement except slept a little better last night but still not enough. No Spravato  today bc 2 weeks of it didn't help.  Agrees to pursue ECT but hoping med changes will help Crying spells.  07/10/23 appt noted: Great.  Andrea [name] back at work.  Playing golf again.   Med: sertraline  200, propranolol  10 BID, olanzapine  15 pm, clonazepam  1 mg HS Did not require ECT.  All of a sudden something clicked and gradually better .  Doesn't even recognize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

## 2024-01-25 NOTE — Telephone Encounter (Signed)
 I did a phone appt with pt 01/25/24

## 2024-01-28 ENCOUNTER — Telehealth: Payer: Self-pay | Admitting: Psychiatry

## 2024-01-28 MED ORDER — QUETIAPINE FUMARATE ER 400 MG PO TB24: 400.0000 mg | ORAL_TABLET | Freq: Every day | ORAL | 0 refills | Status: DC

## 2024-01-28 NOTE — Telephone Encounter (Signed)
 Rx sent.

## 2024-01-28 NOTE — Telephone Encounter (Signed)
 Pt LVM @ 8:37a that she talked to Dr Toi Foster on Friday and he was suppose to send in Quetiapine  400mg  to   [id number] [place], [facility] [address] [place] [address] Phone: [phone]  Fax: [phone]   Next appt 6/18

## 2024-02-01 ENCOUNTER — Telehealth: Payer: Self-pay | Admitting: Psychiatry

## 2024-02-01 NOTE — Telephone Encounter (Signed)
 Pt called back at 3:54p

## 2024-02-01 NOTE — Telephone Encounter (Signed)
 Please call patient to reschedule, said she had an appt on 5/14.   Patient informed of the recommendations. She said she was scheduled for an appt on 5/14. Dr. Toi Foster is off and appt needs to be rescheduled.

## 2024-02-01 NOTE — Telephone Encounter (Signed)
 LVM to [facility]

## 2024-02-01 NOTE — Telephone Encounter (Signed)
 Pt reporting every morning she is struggling to get up. She says she is normally a morning person but wants to lay back down and this is not like her. She is sleeping well. Does not report any difference with 400 mg Seroquel , though it hasn't been long enough. She is taking 3/4 clonazepam , which is not a newer change.   Clonazepam  1 mg - 3/4 tab at bedtime Concerta  54 mg Propranolol  10 mg 2-3 tabs bid prn Seroquel  XR 400 - change on 5/5 Sertraline  150 mg at bedtime

## 2024-02-01 NOTE — Telephone Encounter (Signed)
 Connie Deist lvm today at 9:40 requesting a return call from [place]. She stated she has medication questions for her.  # [phone]

## 2024-02-04 NOTE — Telephone Encounter (Signed)
On canc list   

## 2024-02-07 ENCOUNTER — Ambulatory Visit: Admitting: Psychiatry

## 2024-02-13 DIAGNOSIS — F411 Generalized anxiety disorder: Secondary | ICD-10-CM | POA: Diagnosis not present

## 2024-02-13 DIAGNOSIS — F332 Major depressive disorder, recurrent severe without psychotic features: Secondary | ICD-10-CM | POA: Diagnosis not present

## 2024-02-13 DIAGNOSIS — F331 Major depressive disorder, recurrent, moderate: Secondary | ICD-10-CM | POA: Diagnosis not present

## 2024-02-14 DIAGNOSIS — R319 Hematuria, unspecified: Secondary | ICD-10-CM | POA: Diagnosis not present

## 2024-02-14 DIAGNOSIS — N950 Postmenopausal bleeding: Secondary | ICD-10-CM | POA: Diagnosis not present

## 2024-02-14 DIAGNOSIS — N760 Acute vaginitis: Secondary | ICD-10-CM | POA: Diagnosis not present

## 2024-02-14 DIAGNOSIS — R3915 Urgency of urination: Secondary | ICD-10-CM | POA: Diagnosis not present

## 2024-02-19 ENCOUNTER — Other Ambulatory Visit: Payer: Self-pay | Admitting: Psychiatry

## 2024-02-20 DIAGNOSIS — Z1231 Encounter for screening mammogram for malignant neoplasm of breast: Secondary | ICD-10-CM | POA: Diagnosis not present

## 2024-02-20 DIAGNOSIS — N958 Other specified menopausal and perimenopausal disorders: Secondary | ICD-10-CM | POA: Diagnosis not present

## 2024-02-20 DIAGNOSIS — Z6826 Body mass index (BMI) 26.0-26.9, adult: Secondary | ICD-10-CM | POA: Diagnosis not present

## 2024-02-20 DIAGNOSIS — N8110 Cystocele, unspecified: Secondary | ICD-10-CM | POA: Diagnosis not present

## 2024-02-20 DIAGNOSIS — M8588 Other specified disorders of bone density and structure, other site: Secondary | ICD-10-CM | POA: Diagnosis not present

## 2024-02-20 DIAGNOSIS — Z124 Encounter for screening for malignant neoplasm of cervix: Secondary | ICD-10-CM | POA: Diagnosis not present

## 2024-02-20 LAB — HM MAMMOGRAPHY

## 2024-02-21 ENCOUNTER — Ambulatory Visit: Admitting: Clinical

## 2024-02-21 DIAGNOSIS — F411 Generalized anxiety disorder: Secondary | ICD-10-CM | POA: Diagnosis not present

## 2024-02-21 DIAGNOSIS — F332 Major depressive disorder, recurrent severe without psychotic features: Secondary | ICD-10-CM | POA: Diagnosis not present

## 2024-02-28 ENCOUNTER — Other Ambulatory Visit: Payer: Self-pay

## 2024-02-28 ENCOUNTER — Telehealth: Payer: Self-pay | Admitting: Psychiatry

## 2024-02-28 DIAGNOSIS — F339 Major depressive disorder, recurrent, unspecified: Secondary | ICD-10-CM

## 2024-02-28 MED ORDER — METHYLPHENIDATE HCL ER (OSM) 54 MG PO TBCR: 54.0000 mg | EXTENDED_RELEASE_TABLET | ORAL | 0 refills | Status: DC

## 2024-02-28 NOTE — Telephone Encounter (Signed)
 [facility]

## 2024-02-28 NOTE — Telephone Encounter (Signed)
 Pt called requesting a refill on her concerta  54 mg. [facility] [address] in [place]

## 2024-03-10 ENCOUNTER — Other Ambulatory Visit: Payer: Self-pay | Admitting: Psychiatry

## 2024-03-10 ENCOUNTER — Telehealth: Payer: Self-pay | Admitting: Psychiatry

## 2024-03-10 MED ORDER — OLANZAPINE 5 MG PO TABS: 5.0000 mg | ORAL_TABLET | Freq: Every day | ORAL | 0 refills | Status: DC

## 2024-03-10 NOTE — Telephone Encounter (Signed)
 Sent olanzapine  5 mg 1 hs

## 2024-03-10 NOTE — Telephone Encounter (Signed)
 I see her in 2 days.  Stay on everything until then.

## 2024-03-10 NOTE — Telephone Encounter (Signed)
 Reviewed recommendations and told her about RF for olanzapine  5 mg.

## 2024-03-10 NOTE — Telephone Encounter (Signed)
 Pt called for a RF on olanzapine  5 mg. Reports she took her last 2 last night.   From last visit:  Worse off olanzapine , resume 5 mg temporarily until quetiapine  works.  Start quetiapine  ER  increase to 400 mg HS.  Will likely need 600 mg .  She reports feeling great. Should she continue olanzapine , does she need to wean?

## 2024-03-10 NOTE — Telephone Encounter (Signed)
 Pt lvm that she needs a refill on her olanzapine . [facility] [address] on [place]

## 2024-03-12 ENCOUNTER — Ambulatory Visit (INDEPENDENT_AMBULATORY_CARE_PROVIDER_SITE_OTHER): Admitting: Psychiatry

## 2024-03-12 ENCOUNTER — Encounter: Payer: Self-pay | Admitting: Psychiatry

## 2024-03-12 DIAGNOSIS — F339 Major depressive disorder, recurrent, unspecified: Secondary | ICD-10-CM

## 2024-03-12 DIAGNOSIS — F411 Generalized anxiety disorder: Secondary | ICD-10-CM

## 2024-03-12 DIAGNOSIS — F5105 Insomnia due to other mental disorder: Secondary | ICD-10-CM

## 2024-03-12 MED ORDER — QUETIAPINE FUMARATE ER 400 MG PO TB24: 400.0000 mg | ORAL_TABLET | Freq: Every day | ORAL | 0 refills | Status: DC

## 2024-03-12 MED ORDER — DEPLIN FC 15 MG PO CAPS: 15.0000 mg | ORAL_CAPSULE | Freq: Every day | ORAL | 3 refills | Status: AC

## 2024-03-12 MED ORDER — METHYLPHENIDATE HCL ER (OSM) 54 MG PO TBCR: 54.0000 mg | EXTENDED_RELEASE_TABLET | ORAL | 0 refills | Status: DC

## 2024-03-12 MED ORDER — SERTRALINE HCL 100 MG PO TABS: 150.0000 mg | ORAL_TABLET | Freq: Every day | ORAL | 0 refills | Status: DC

## 2024-03-12 NOTE — Progress Notes (Addendum)
 AMINAT SHELBURNE [id number] Jan 19, 1951 73 y.o.   Subjective:   Patient ID:  Andrea [name] is a 73 y.o. (DOB 1950-10-19) female.  Chief Complaint:  Chief Complaint  Patient presents with   Follow-up   Depression   Anxiety    HPI TONESHIA COELLO presents to the office today for follow-up of MDE and anxiety disorder.  seen 09/2019.  No meds were changed.  Been on Lexapro  20 since early 2017.  02/27/2020 phone call from patient: Pt experiencing more anxiety than she normally has. Pt would like to know what are some options that she has as far as her medication. Pt will be in an appt  MD response: If the anxiety is just occasional then using alprazolam  as needed would be an okay thing to do.  If it is been fairly persistent for a couple of weeks or more than the simplest solution would be to increase the Lexapro  to 1-1/2 tablets daily.  Historically she has done well on Lexapro  20 mg daily for an extended period of time.  If she is under temporary stress then once that is resolved we could drop it back to 1 daily.  If this does not work as expected then she should schedule an earlier appointment. Pt response: Patient called back and she's been having temporary stress, but it's not bad. Things are great, she's working out and feeling okay for the most part. It's been going on for a bit so she agrees to the increase Lexapro  20 mg 1.5 tablets daily. She has been taking alprazolam  at hs occasionally to help sleep better and it does help.   12/06/2020 appointment with the following noted:  No covid.  H Covid and recovered.   Had increased Lexapro  to 30 mg for 90 days and saw a difference but started seeing weight gain and was doing oK and able to drop back and been OK. Still renovating a house for a year.  Sold her house and mourns the house. Calmed down.  Kept 3 gkids and dogs 10 days.   VEAR Collum Hopes for new house May 1. Rare alprazolam  for HS. Stress moving to condo and would have crying spell over the move  and some problems she was running into dealing with it.  Hard with change.  But taking time to adjust to the idea of moving from her house of 23 years.  Initially refused but he left it up to her and she's come to peace about it.  VEAR Collum.   Good response to medication and no SE.  Exercising is good medicine.  If doesn't then doesn't feel as good. Satisfied. Plan: no med changes  08/01/21  appt noted:  seen with [name] died 08-06-24.  A lot of ups and downs per D.  Hard dips. Got worse when retired and dealing with the new  house.  Had trouble getting rid of things from the old house causing regrets over sellling the house.  D notes doesn't do well with instability  and stressors.  Ruminates on past mistakes. Blamed Collum for the  difficulty renovating the house.  Neg self talk and beats herself up. When not enough sleep will get depressed.  Does better if busy.   Dreads things.  Patient denies difficulty with sleep initiation or maintenance. Denies appetite disturbance.  Patient reports that energy and motivation have been good. Patient denies any difficulty with concentration.  Patient denies any suicidal ideation.  Plan: Abilify  2.5 mg  daily for a week, then 5 mg daily. Continue Lexapro  20 mg daily  08/31/2021 appt noted: It worked immediately.  Insomnia with EMA. Normally 9 hours.  It's like I'm high.  Not tired.  Average 3-4 hours. Not depressed and no crazy negative thoughts.  Good response.  Energetic but up in middle of night. Plan: Okay to stop Abilify  because of insomnia.   09/29/2021 appointment with the following noted: Parties were great.  Sleep problem resolved.  A lot of rest at the beach. Maybe the last week or 2 less motivation and socialization.  Not laying in the bed.  Just not as good as she was.  Not as outgoing as normal. No alcohol now.  Only had 1-2 at night.  Not ruminating.   Willing to restart Abilify  at lower dose 2 mg daily. 3 grandsons  Plan: Relapse  depression recurs she can resume Abilify   but lower dose 2 mg daily or every other day to try to avoid the insomnia where she can contact our office and we can discussed the possibility of an alternative antidepressant such as Trintellix  or duloxetine .  12/28/2021 appointment noted: Several phone calls since she was here.  Abilify  plus Lexapro  failed.  Switch to duloxetine  90 mg which she started 12/02/2018 2023 Started lamotrigine . Also started Rexulti  Sister has had cycling depression that has responded well to lamotrigine  with poor response to SSRIs [name] with a little dementia. SE tremor 90 mg duloxetine , ? Wt gain over a few weeks.. Exercise and wt control Social isolation, low self esteem, worry about the way she looks and not usually that way. Crying better with duloxetine .  More dependent on H.   Anhedonia.  Black hole.  Never this low. No SI Having to use Xanax .   Asks about day treatment and TMS Plan: rec TMS Continue lamotrigine  as prescribed Increase Rexulti  to 1 mg daily Switch to Trintellix :  reduce duloxetine  to 2 of the 30 mg capsules and start Trintellix  5 mg daily for 5 days,  Then Increase Trintellix  to 10 mg daily and reduce duloxetine  to 1 capsule for 1 week. Then stop duloxetine .  02/15/22 appt noted: On Trintellix  , Rexulti  1 and lamotrigine  I feel good.  Walks 3 miles daily is great for mental health. TMS would not be covered by [facility]. Started feeling better after a week or 2 after the last visit. SE appetite increased. No nausea. Not crying anymore. Sleeping well and no longer ruminates. Plan: Continue Trintellix  but DC Rexulti  due to weight gain.  Continue lamotrigine   03/06/2022 appointment with the following noted: Last 2 weeks has been very sad and nervous. Trintellix  was increased to 20 mg daily  03/10/2022 complaining of ongoing depression, feeling jittery, negative thinking, early morning awakening, ruminating about past decisions.  Wanting to consider TMS  because she is desperate for improvement. Plan we will schedule urgent appointment  03/15/22 urgent appt noted:  seen with VEAR Collum Very depressed, worst ever.  Don't want to be alone, ongoing worry, ruminating on past decisions.  Racing negative thoughts.  No motivation.  Trouble staying asleep.  Getting 4 hours and used to 9 hours. Dread getting up. Using Xanax  Increased Trintellix  to 20 mg 9 days ago. H agrees sleep is critical. Isolating.   Need to be better by July 11. Plan: Clonazepam  1 mg HS. Continue trintellix  20 mg daily she is only been on this dose 9 days and it needs more time. Re lamotrigine  and increase to 150 mg daily.SABRA  Vraylar  1.5 mg every other day Option TMS  03/24/22 appt noted: So much better.  Thinking straighter and not negative and not sad.  Sleeping better 8-9 hours.. No crying. H notices progressively better every day.  Mind clarity is much better.   No SE noted.  No hangover.  No nausea. Found a therapist, Heather Jungling.   Feels well enough to go to [place] and kind of excited.  Back July 16.   Plan: Clonazepam  1 mg HS. This resolved insomnia without SE Continue trintellix  20 mg daily she is only been on this dose 20 days and it needs more time. Re lamotriginecontinue 150 mg daily..    Vraylar  1.5 mg every other day Option TMS  04/12/22 appt noted: Great.  Went to [place].  Was herself and enjoyed it.  Anxiety was managed.   [facility] and liked her. 3# wt gain.   Patient reports stable mood and denies depressed or irritable moods.  Patient denies any recent difficulty with anxiety.  Patient denies difficulty with sleep initiation or maintenance. Denies appetite disturbance.  Patient reports that energy and motivation have been good.  Patient denies any difficulty with concentration.  Patient denies any suicidal ideation. Sleeping well than not anxious. Plan: Trintellix  20  07/10/22 TC depression returned recently.  08/15/22 appt  noted: Still blunted  and diminished motivation.  Some days better than others.  Going to gym.  Partially better.   Still has a lot of anxiety and doesn't want to be alone. Functioning but not normal.  Dreads parties.   SE tremor for a couple of week.sleep great.  Dep 7/10. Current Vraylar  1.5 mg daily, increase lamotrigine  200 mg daily, Trintellix  20 , clonazepam  0.5 mg HS Plan: Reduce Clonazepam  1/2  mg HS. This resolved insomnia without SE Continue trintellix  20 mg daily only mildly effective. lamotrigine  continue 200 mg daily..    DC Vraylar  DT lost response Auvelity  Stop Vraylar  After Thanksgiving stop Trintellix  Wait 3 days then Start Auvelity  1 in the morning for 1 week,  and if no side effects then increase Auvelity  to 1 in the AM and 1 in the PM  09/04/22 TC: Complaining of persistent depression and wanting to do something else to help.  Added lithium  CR 300 mg nightly  09/11/2022 phone call complaining of no improvement with Auvelity  and lithium .  Appointment was moved up.  09/13/22 appt noted: Nothing changed. No better and no worse.  Awakens sad and tearful.  Sleeps well. Pushing herself to the gym.  Has a trainer. On clonazepam  0.5 mg HS She remains anxious when alone for no apparent reason.  Feelings of inferiority.  She is normally extroverted but now she is wanting to isolate from people.  All tasks seem monumental.  No enjoyment and not looking forward to anything.  Everything is a Personal assistant.  She remains generally worried and anxious which is not typical for her. Plan: Clonazepam  1/2  mg HS. This resolved insomnia without SE Stop Auvelity  Start nortriptyline  1 of the 25 mg capsules at night for 4 nights then 2 at night for 4 nights then 3 at night, then wait 1 week and get the blood test in the morning at [facility] Reduce lamotrigine  to 1 and 1/2 tablets daily  Lamotrigine  has not been helpful to him 100 mg daily.  Start reducing lamotrigine  150 mg daily and will continue to  taper at next appointment.  09/27/21 urgent appt :  she wanted urgent appt bc desperate to  feel better.  Seen with H Called at least twice since here for the same reasons of depression. Not sleeping well even with clonazepam  1 mg HS. Doesn't want to be alone. Shakey.   On lithium  300 mg daily, nortriptyline  75 mg HS.   Doesn't want to live like this but not acutely suicidal. Tolerating meds. Plan: pursue [facility] protocol [facility]MS at [facility] bc faster optin than alternatives  10/30/22 TC: finished 50 TMS tx in the week and wants appt ASAP bc still struggling with crying and depresssion..  11/03/22 urgent appt : Completed TMS last week. Tue-Thur better days and then Friday nose-dived. Not done well since got back.   H notes high anxiety and crying in AM and PM and doesn't want to be alone.  Xanxax seems to help. Xanax  helps and taking 0.5 mg AM and 1 mg HS H notes memory is sharper and she's more alert after treatment. Very anxioius all the time.  Get up sad.  Exercising. Sleep good with Xanax  1 mg HS. Plan: Increase Lexapro  20 mg dailly (should help crying and maybe anxiety but unlikely to resolve depression) Increase alprazolam  to 0.5 mg TID and 1 mg HS for severe anxiety. H notes it helps. Pramipexole  0.25 BID for 5 days and if NR then 0.5 mg Bid off label. Reduce lamotrigine  100 mg for 2 weeks then 50 mg daily for 2 weeks then stop it. She wants to pursue Spravato  if pramipexole  fails  11/17/22 emergency work in appt: Pramipexole  was not sent in and MD not aware until yesterday.  She is not any better and is desperate for a med change. Sleeping well and taking Xanax  1 mg HS Mornings are worse.  Not crying much.  Last Friday was a bad day and she didn't want to be alone.  But did let her H play golf once this week. Reduced lamotrigine  today to 50 BID and weaning off. H says better this week than last week. Increased Lexapro  to 20 mg daily. Plan: Increased Lexapro  20 mg dailly (helped crying  and maybe anxiety but unlikely to resolve depression) Continue alprazolam  to 0.5 mg TID prn anxiety and 1 mg HS. H notes it helps. Pramipexole  0.25 mg BID for 3 days then 0.5 mg BID Reduce lamotrigine  50 mg daily for 2 weeks then stop it. She wants to pursue Spravato  if pramipexole  fails  12/25/22 appt urgently. COMPLETED 1 WEEK TMS [facility] without benefit except TMS took away brain fog.   She feels need to take Xanax  0.5 mg TID  Some sleepiness with Xanax  . Mornings are better and fine while at the gym but when home gets depressed again.   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 Needs Xanax  to do social things. Plan: Increased Lexapro  20 mg dailly (helped crying and maybe anxiety but unlikely to resolve depression) Continue alprazolam  to 0.5 mg TID prn anxiety and 1 mg HS. H notes it helps. Change pramipexole  0.5mg  tablets, 1 tablet four times daily for 1 week,  Then if not better 1and 1/2 tablets in the AM and dinner and 1 tablet at lunch and dinner.    4/9-4/15 hosp for ruptured appendix and missed meds: Can restart Lexapro  at 1/2 tablet daily for 3 days then 1 daily. Restart 1/2 pramipexole  tablet 3 times daily for 3 days then 1 tablet 3 times daily for 3 days then 1 and 1/2 tablet 3 times daily.      01/12/23 appt noted: No GI sx now.  No appetite but  working on protein.   I think I'm doing real well mentally.  Has resumed meds.  Mood has been great. Pramipexole  0.5mg   1 and 1/2 TID Lexapro  20 Xanax  reduced to 1 mg HS H sees a difference.   No SE with meds.   No med changes  01/25/23 appt noted: Meds:  pramipexole  0.75 mg TID, Xanax  1.5 mg HS and 0.5 mg prn, Lexapro  20 mg daily. Doing great.  Staples removed and healing from surgery appendectomy. Back in the gym helps her.   Satisfied with meds. Getting out socially and feeling like her old self.  Chelsea's H Brad went to ER vomiting blood.   Plan: no med changes  02/07/23 appt noted: Back from [facility] yesterday.  Had ear infection.   Then conjunctivitis.  Eyes hurt.   Before the surgery was getting better and feels like she is sliding back some.  Gradually a little worse since here.  Wonders if she could get better with joy and social drive.   Off Abx .  Sleep is good with meds.  With depression then gets anxious too.  Is functioning.   Meds:  pramipexole  0.75 mg TID, Xanax  1.5 mg HS and 0.5 mg prn, Lexapro  20 mg daily. Consistent and no SE. Plan: Continue Lexapro  20 mg dailly (helped crying and maybe anxiety but unlikely to resolve depression) Continue alprazolam   1 mg HS. H notes it helps. Increase pramipexole  0.5mg  tablets, 2 tablets 3 times daily  01/31/23 TC:Con Dawna DASEN, CMA  to Me     02/20/23  4:56 PM Note Patient reporting she is not feeling as good as she thinks she should. Her anxiety is the biggest concern. She said she can go to the gym in the mornings and does ok, but she had a closing to go to recently and had to take 1/4 of a Xanax , reports it took the edge off. She reports daily tremors that are helped by Xanax . She is not crying, she is getting things done, sleeping okay. Will have her grandchildren next week and she isn't excited about it like she should be.    Taking 6 mirapex , 1.5 Xanax  and Lexapro  20 mg.     MD resp:  CC   02/20/23  5:48 PM Note We just increased the pramipexole  recently and I see her next week.  I do not want to increase the medicine any further until I see her again.      03/02/23 urgent appt noted: Some improvement with incr pramipexole  1 mg TID. Before felt worse in AM now feels better in AM and goes to gym. Otherwise no excitement.  Hard to make decisions. Less social and smiling.  Subdued more than normal.  Not confident.  Not enjoying present and ruminates in past.   No SE.  No impulsivity Has tremor for awhile .  Is mild for a month. Hard to conc.  Brain fog was better with TMS but is back. Continue Lexapro  20 mg dailly (helped crying and maybe anxiety but unlikely  to resolve depression) Continue alprazolam   1 mg HS. H notes it helps. Increase pramipexole  1 mg QID times daily  03/16/23 appt noted: Meds as above SE: If not enough food makes her excitable, urgent, talking fast.  Does it a bit too much.  Energy fine.   Feels better in the AM at the gym and when home a dark shadow comes over her.   Still feels dep overall.  Went to work with friends and  didn't feel natural and didn't enjoy it like she should.  Pushes herself to stay active.   Takes 1.5 mg Xanax  HS and then awakens and takes another 0.5 mg HS but gets enough sleep. A lot of brain fog.  Feels inferior and doesn't usually feel that way. Infrequent crying spells. Plan:  Continue Lexapro  20 mg dailly (helped crying and maybe anxiety but unlikely to resolve depression) Continue alprazolam   1 mg HS. H notes it helps. Reduce pramipexole  1 mg TID for 3 days, then 0.5 mg QID for 3 days, then 0.5 mg BID for 3 days, then 0.25 mg BID for 3 days, then stop it. Now start Latuda  (lurasidone ) 40 mg 1/2 tablet for 1 week.  If not benefit then 1 tablet daily with 350 calories  04/13/23 urgent appt note: with [facility] 04/05/23 for dep with SI Worst I've ever been.  Feels out of control.  Shaking .  Can't sleep.  Feels disconnected.   Hated being hosp bc locked up.   Taking quetiapine  100 hS and lorazepam and still can't sleep. Started sertraline  50 and stopped Lexapro . Quetiapine  100 mg HS Plan: Stop hydroxyzine Increase quetiapine  200 then 300 mg at night for sleep and depression  Stop lorazepam and return to alprazolam  1 mg at night for sleep Increase sertraline  to 100 mg daily or 2 of 50 mg nightly  04/23/23 appt noted: Here for first appt with Spravato .  Highly anxious about getting Spravato  today.  Doesn't like feeling out of control and worries over this. Still very depressed.  Doesn't think her sx as noted are due to depression.  Thinks she has a neuro problem.  But she has no other neuro sx like  numbness, weakness, balance px, or anything other than dep and anxiety sx. Received Spravato  56 mg today.  It was horrible! Bc she felt out of control and was anxious receiving it.  Thought it would last forever re: dissociation.  It was scary to her.  Couldn't relax for it.  No N, V, HA.  Ambivalent about continuing Spravato  but open. No specific concerns with meds.    04/25/23 appt noted: Needed Xanax  before visit today to overcome fear of coming to the appt. She was more disciplined about controlling neg thought during Spravato  admin and had a much better experience.  It was not scary nor associated with sig fear.  She is still dep and anxious without change otherwise since seen a couple of days ago. Tolerating med changes from last week.   Received Spravato  56 mg again and tolerated it without adverse SE.  Typical SE dissociation, no NV, palpitations, HA.  Motivated to continue the Spravato  and agrees to increase it.  Better experience than the first time. Plan: Increase quetiapine  to 400 mg HS  04/26/23 TC:  Patient taking 300 mg of Seroquel  and 1 mg of Xanax  for sleep. She can get to sleep, just can't stay asleep. Doesn't say how long she is sleeping, but says she needs 9 hours of sleep. Reporting social isolation is bad, she won't leave the house, but doesn't like being home alone.  Reports she has never been this sick mentally and feels like she may need to go to the hospital. No SI, just doesn't like being alone.     MD resp:  Pt just seen yesterday and received 2nd Spravato  56 mg .  That was positive experience. However she is still severely depressed and ruminating and reassurance seeking..  She doesn't need to  go to the hospital. She told me yesterday she slept 7 hours but needs 9 hours.  7 hours is not bad.   She is taking quetiapne 300 mg HS with Xanax  1 mg HS.  No increase in Xanax  but can increase quetiapine  to 400 mg HS (max dose 800) and this could help sleep and anxiety and  depression. She should keep appt next week for next Spravato .  In case she asks, Spravato  is not making her worse in any way.     05/01/23 appt noted;H present also Current meds; quetiapine  400 mg HS, sertraline  100 mg daily, alprazolam  0.5 mg TID and 1 mg HS. Tolerating med changes.    Received Spravato  84 mg first time.  Typical SE dissociation, no NV, palpitations, HA.  However she had a bad experience DT severe anxiety before starting the Spravato .   Very fearful, scared.  Without specific reasons.  Severe anxiety dep, hopeless.  No SI but doesn't want to live like this.  So anxious hard to sleep and some crying spells. Seroquel  not helping sleep.  05/03/23 appt noted: Current meds; quetiapine  400 mg HS, sertraline  100 mg daily, alprazolam  0.5 mg TID and 1 mg HS. Tolerating med changes.    Received Spravato  84 mg.  Typical SE dissociation, no NV, palpitations, HA.  However she had a bad experience DT severe anxiety before starting the Spravato .   Highly anxious, fearful of everything including Spravato  admin.  Afraid she will forget who she is despite having it before.  Still anxious and trouble sleeping at home.  Sleep no better with quetiapine  and still ruminatiing.   Plan increase quetiapine  to 500 mg HS  05/08/23 appt noted: Meds: quetiapine  500 mg HS, sertraline  100 mg daily, alprazolam  0.5 mg TID and 1 mg HS. Received Spravato  84 mg 3rd time.  Typical SE dissociation, no NV, palpitations, HA.  However she had a bad experience DT severe anxiety before starting the Spravato .   She is not better so far.  Ongoing severe depression anxiety, avoidance, anhedonia, rumination, afraid to be alone. All not typical of herself.   No SE with meds except constipation and sweating at night.  Plan:  Increase quetiapine  to 600 mg at night for sleep and depression & rumination and next week to 600 HS increased alprazolam  1 mg at night for sleep and 0.5 mg TID Increase sertraline  150 mg daily   Spravato  84 mg AM twice weekly for severe TRD  05/10/23 appt noted:  Meds: quetiapine  600 mg HS, sertraline  150 mg daily, alprazolam  0.5 mg TID and 1 mg HS. Received Spravato  84 mg 4th time.  Typical SE dissociation, no NV, palpitations, HA.  However she had a bad experience DT severe anxiety before starting the Spravato .   She is not better so far.  Ongoing severe depression anxiety, avoidance, anhedonia, rumination, afraid to be alone. All not typical of herself.   SE constipation she finds unmanageable  Did sleep better with increased quetiapine  600 mg Hs but cannot continue dT constipation.  However dep and anxiety are no better with Spravato  or the other treatments except better sleep.  Anxiety ongoing severe with rumination.  05/17/23 appt noted:  Meds: switched to olanzapine  15 mg HS, sertraline  150, alprazolam   Tremor seems worse but H disagrees. Mood continually worse.  She is dependent on H and afraid to be alone.  No sweating. Xanax  helped but wears off after a couple of hours.  She is open to antoher option Tolerating med  changes. No improvement except slept a little better last night but still not enough. No Spravato  today bc 2 weeks of it didn't help.  Agrees to pursue ECT but hoping med changes will help Crying spells.  07/10/23 appt noted: Great.  Gerlean back at work.  Playing golf again.   Med: sertraline  200, propranolol  10 BID, olanzapine  15 pm, clonazepam  1 mg HS Did not require ECT.  All of a sudden something clicked and gradually better .  Doesn't even recognize the person she was with depression.   No negative thoughts.  Laughing again.  More social and talking again.  Happy in the am.  Sleep very well 10-11 hours.  Everything back to normal .  Doesn't need Gerlean with her now.  Ok being alone.  Anxiety gone also. SE carb craving and wt gain 10#.   Plan: Propranolol  10 mg tablet 2-3 tablets twice daily as needed for tremor Continue clonazepam  1 mg tablet 1/2 twice  daily and 1 tablet at night Continue sertraline  200 tablets daily. Switch olanzapine  15 to Lybalvi 10 DT wt gain.    08/10/23 appt noted: Psych med: sertraline  200, propranolol  10 BID, Lybalvi 10 pm, clonazepam  1 mg HS Up to 141# with Lybalvi.  Went to wt reducing clinic and got shot semaglutide.  $75/ week.  That took appetite away.   Was a little more down after a couple of missed doses of Lybalvi. Mood had been great  until the missed dose.  Sleeping a lot.  No problems with change from olanzapine  15 to Lybalvi 10-10 daily but no benefit with appetite reduction.   Party tomorrow night.  Got house decorated yesterday.   Since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

## 2024-03-12 NOTE — Patient Instructions (Signed)
 Stop olanzapine  and only take propranolol  1-2 tablets IF NEEDED for tremor

## 2024-03-13 DIAGNOSIS — N950 Postmenopausal bleeding: Secondary | ICD-10-CM | POA: Diagnosis not present

## 2024-03-24 ENCOUNTER — Telehealth: Payer: Self-pay | Admitting: Psychiatry

## 2024-03-24 ENCOUNTER — Other Ambulatory Visit: Payer: Self-pay

## 2024-03-24 MED ORDER — OLANZAPINE 5 MG PO TABS: 5.0000 mg | ORAL_TABLET | Freq: Every day | ORAL | 0 refills | Status: DC

## 2024-03-24 MED ORDER — QUETIAPINE FUMARATE ER 300 MG PO TB24: 600.0000 mg | ORAL_TABLET | Freq: Every day | ORAL | 0 refills | Status: DC

## 2024-03-24 NOTE — Telephone Encounter (Signed)
 Sorry to hear the depression is returning again.  The last change we made was stopping olanzapine  5 mg HS.  It is likely that stopping that has triggered the depression.  My long term goal is to eliminate the olanzapine  bc of wt gain, but I want to give her relief ASAP so resume the olanzapine  5 mg HS. However in hopes of eventually being able to stop it, increase Seroquel  XR to 300 mg 2 tablets HS, #60, no refills.  Please send this in.  I'm hoping it will work well enough that we can stop the Seroquel . If she doesn't feel better within 2 weeks call. Lorene Macintosh, MD, DFAPA

## 2024-03-24 NOTE — Telephone Encounter (Signed)
 Patient notified of recommendations. Scripts for olanzapine  5 mg and Seroquel  XL 300, 2 at bedtime sent.

## 2024-03-24 NOTE — Telephone Encounter (Signed)
 Pt called in at 10:36a stating she is having some issues with her mental health and wanting to know if she should get back on her tremor meds and 1 other medication she's couldn't recall the name.

## 2024-03-24 NOTE — Telephone Encounter (Signed)
 Patient reporting she is slipping back into depression. Rates dep as 5-6/10, anxiety the same. She reports not sleeping well, but when I ask her how many hours she is sleeping she reports 8-10. She said she wakes up with anxiety.   Is taking Concerta  and Seroquel .  Not taking olanzapine , propranolol , or clonazepam . Reported going to the beach this past weekend. For at least the last 2-1/2 years every beach trip her depression seems to increase.

## 2024-03-25 NOTE — Telephone Encounter (Signed)
Put her on cancellation list. °

## 2024-03-26 ENCOUNTER — Ambulatory Visit
Admission: RE | Admit: 2024-03-26 | Discharge: 2024-03-26 | Disposition: A | Discharge status: Home / Self Care | Source: Ambulatory Visit | Attending: Physician Assistant | Admitting: Physician Assistant

## 2024-03-26 ENCOUNTER — Other Ambulatory Visit: Payer: Self-pay | Admitting: Physician Assistant

## 2024-03-26 DIAGNOSIS — R2242 Localized swelling, mass and lump, left lower limb: Secondary | ICD-10-CM | POA: Insufficient documentation

## 2024-03-26 DIAGNOSIS — M7989 Other specified soft tissue disorders: Secondary | ICD-10-CM | POA: Diagnosis not present

## 2024-03-26 DIAGNOSIS — M25472 Effusion, left ankle: Secondary | ICD-10-CM | POA: Diagnosis not present

## 2024-03-26 NOTE — Telephone Encounter (Signed)
 Pt has been added to the wait list.

## 2024-04-07 ENCOUNTER — Telehealth: Payer: Self-pay | Admitting: Psychiatry

## 2024-04-07 NOTE — Telephone Encounter (Signed)
 Pt said she had discussed with you at last visit about holding Concerta  due to upcoming surgery. She said she is having surgery 7/24. Pharmacist told her to hold day before, day or, and day after surgery, but she wanted to get your feedback. Has appt with surgeon 7/15. She also reports she is doing really, really good.

## 2024-04-07 NOTE — Telephone Encounter (Signed)
 Pt needs advise on stopping a med before surgery next week. Ask Dawna to return call @ [phone]

## 2024-04-08 DIAGNOSIS — N8110 Cystocele, unspecified: Secondary | ICD-10-CM | POA: Diagnosis not present

## 2024-04-08 DIAGNOSIS — K432 Incisional hernia without obstruction or gangrene: Secondary | ICD-10-CM | POA: Diagnosis not present

## 2024-04-09 NOTE — Telephone Encounter (Signed)
 Patient notified about holding Concerta .

## 2024-04-17 ENCOUNTER — Other Ambulatory Visit: Payer: Self-pay | Admitting: Psychiatry

## 2024-04-17 DIAGNOSIS — N812 Incomplete uterovaginal prolapse: Secondary | ICD-10-CM | POA: Diagnosis not present

## 2024-04-17 DIAGNOSIS — N858 Other specified noninflammatory disorders of uterus: Secondary | ICD-10-CM | POA: Diagnosis not present

## 2024-04-28 ENCOUNTER — Telehealth: Payer: Self-pay | Admitting: Psychiatry

## 2024-04-28 NOTE — Telephone Encounter (Signed)
 Pt had surgery about 2 weeks ago.  Prior to surgery she reported doing really well, but now reporting increased depression (8/10) and crying spells. Not tearful during our conversation. No anxiety. She stopped the Concerta  for 3 days for surgery, but reports taking all usual medications otherwise.   Deplin Concerta  54 mg Seroquel  XR 600 Sertraline  150 mg  Still taking the olanzapine  5 mg  From 6/18 note:   Stop olanzapine  and only take propranolol  1-2 tablets IF NEEDED for tremor

## 2024-04-28 NOTE — Telephone Encounter (Signed)
 LVM to [facility]

## 2024-04-28 NOTE — Telephone Encounter (Signed)
 Pt called at 9:27a stating she had a hysterectomy a couple of weeks ago.  She said she's in a tail spin with her depression and she wants some help with her meds.  Next appt 8/26

## 2024-04-28 NOTE — Telephone Encounter (Signed)
 Schedule her this Friday at 230pm.  I need to see her to decide what to do with meds.

## 2024-04-29 ENCOUNTER — Other Ambulatory Visit: Payer: Self-pay | Admitting: Psychiatry

## 2024-04-29 NOTE — Telephone Encounter (Signed)
 Dr. Geoffry wants to see 8/8. Does she have enough to get to appt?

## 2024-04-29 NOTE — Telephone Encounter (Signed)
 Pt said she had enough olanzapine  to get her to her appt on Friday.

## 2024-05-02 ENCOUNTER — Ambulatory Visit (INDEPENDENT_AMBULATORY_CARE_PROVIDER_SITE_OTHER): Admitting: Psychiatry

## 2024-05-02 ENCOUNTER — Encounter: Payer: Self-pay | Admitting: Psychiatry

## 2024-05-02 DIAGNOSIS — F339 Major depressive disorder, recurrent, unspecified: Secondary | ICD-10-CM

## 2024-05-02 DIAGNOSIS — F411 Generalized anxiety disorder: Secondary | ICD-10-CM

## 2024-05-02 DIAGNOSIS — F5105 Insomnia due to other mental disorder: Secondary | ICD-10-CM | POA: Diagnosis not present

## 2024-05-02 MED ORDER — CELECOXIB 400 MG PO CAPS: 400.0000 mg | ORAL_CAPSULE | Freq: Every day | ORAL | 1 refills | Status: DC

## 2024-05-02 MED ORDER — ALPRAZOLAM 0.5 MG PO TABS: 0.5000 mg | ORAL_TABLET | Freq: Every evening | ORAL | 0 refills | Status: DC | PRN

## 2024-05-02 NOTE — Progress Notes (Signed)
 Andrea [name] [id number] 1951/03/22 73 y.o.   Subjective:   Patient ID:  Andrea [name] is a 73 y.o. (DOB 08/15/1951) female.  Chief Complaint:  Chief Complaint  Patient presents with   Follow-up   Depression   Anxiety    HPI Andrea [name] presents to the office today for follow-up of MDE and anxiety disorder.  seen 09/2019.  No meds were changed.  Been on Lexapro  20 since early 2017.  02/27/2020 phone call from patient: Pt experiencing more anxiety than she normally has. Pt would like to know what are some options that she has as far as her medication. Pt will be in an appt  MD response: If the anxiety is just occasional then using alprazolam  as needed would be an okay thing to do.  If it is been fairly persistent for a couple of weeks or more than the simplest solution would be to increase the Lexapro  to 1-1/2 tablets daily.  Historically she has done well on Lexapro  20 mg daily for an extended period of time.  If she is under temporary stress then once that is resolved we could drop it back to 1 daily.  If this does not work as expected then she should schedule an earlier appointment. Pt response: Patient called back and she's been having temporary stress, but it's not bad. Things are great, she's working out and feeling okay for the most part. It's been going on for a bit so she agrees to the increase Lexapro  20 mg 1.5 tablets daily. She has been taking alprazolam  at hs occasionally to help sleep better and it does help.   12/06/2020 appointment with the following noted:  No covid.  H Covid and recovered.   Had increased Lexapro  to 30 mg for 90 days and saw a difference but started seeing weight gain and was doing oK and able to drop back and been OK. Still renovating a house for a year.  Sold her house and mourns the house. Calmed down.  Kept 3 gkids and dogs 10 days.   Andrea [name] for new house May 1. Rare alprazolam  for HS. Stress moving to condo and would have crying spell over the move  and some problems she was running into dealing with it.  Hard with change.  But taking time to adjust to the idea of moving from her house of 23 years.  Initially refused but he left it up to her and she's come to peace about it.  Andrea [name].   Good response to medication and no SE.  Exercising is good medicine.  If doesn't then doesn't feel as good. Satisfied. Plan: no med changes  08/01/21  appt noted:  seen with [name] died 2024-08-11.  A lot of ups and downs per D.  Hard dips. Got worse when retired and dealing with the new  house.  Had trouble getting rid of things from the old house causing regrets over sellling the house.  D notes doesn't do well with instability  and stressors.  Ruminates on past mistakes. Blamed [name] for the  difficulty renovating the house.  Neg self talk and beats herself up. When not enough sleep will get depressed.  Does better if busy.   Dreads things.  Patient denies difficulty with sleep initiation or maintenance. Denies appetite disturbance.  Patient reports that energy and motivation have been good. Patient denies any difficulty with concentration.  Patient denies any suicidal ideation.  Plan: Abilify  2.5 mg  daily for a week, then 5 mg daily. Continue Lexapro  20 mg daily  08/31/2021 appt noted: It worked immediately.  Insomnia with EMA. Normally 9 hours.  It's like I'm high.  Not tired.  Average 3-4 hours. Not depressed and no crazy negative thoughts.  Good response.  Energetic but up in middle of night. Plan: Okay to stop Abilify  because of insomnia.   09/29/2021 appointment with the following noted: Parties were great.  Sleep problem resolved.  A lot of rest at the beach. Maybe the last week or 2 less motivation and socialization.  Not laying in the bed.  Just not as good as she was.  Not as outgoing as normal. No alcohol now.  Only had 1-2 at night.  Not ruminating.   Willing to restart Abilify  at lower dose 2 mg daily. 3 grandsons  Plan: Relapse  depression recurs she can resume Abilify   but lower dose 2 mg daily or every other day to try to avoid the insomnia where she can contact our office and we can discussed the possibility of an alternative antidepressant such as Trintellix  or duloxetine .  12/28/2021 appointment noted: Several phone calls since she was here.  Abilify  plus Lexapro  failed.  Switch to duloxetine  90 mg which she started 12/02/2018 2023 Started lamotrigine . Also started Rexulti  Sister has had cycling depression that has responded well to lamotrigine  with poor response to SSRIs [name] with a little dementia. SE tremor 90 mg duloxetine , ? Wt gain over a few weeks.. Exercise and wt control Social isolation, low self esteem, worry about the way she looks and not usually that way. Crying better with duloxetine .  More dependent on H.   Anhedonia.  Black hole.  Never this low. No SI Having to use Xanax .   Asks about day treatment and TMS Plan: rec TMS Continue lamotrigine  as prescribed Increase Rexulti  to 1 mg daily Switch to Trintellix :  reduce duloxetine  to 2 of the 30 mg capsules and start Trintellix  5 mg daily for 5 days,  Then Increase Trintellix  to 10 mg daily and reduce duloxetine  to 1 capsule for 1 week. Then stop duloxetine .  02/15/22 appt noted: On Trintellix  , Rexulti  1 and lamotrigine  I feel good.  Walks 3 miles daily is great for mental health. TMS would not be covered by [facility]. Started feeling better after a week or 2 after the last visit. SE appetite increased. No nausea. Not crying anymore. Sleeping well and no longer ruminates. Plan: Continue Trintellix  but DC Rexulti  due to weight gain.  Continue lamotrigine   03/06/2022 appointment with the following noted: Last 2 weeks has been very sad and nervous. Trintellix  was increased to 20 mg daily  03/10/2022 complaining of ongoing depression, feeling jittery, negative thinking, early morning awakening, ruminating about past decisions.  Wanting to consider TMS  because she is desperate for improvement. Plan we will schedule urgent appointment  03/15/22 urgent appt noted:  seen with Andrea [name] Very depressed, worst ever.  Don't want to be alone, ongoing worry, ruminating on past decisions.  Racing negative thoughts.  No motivation.  Trouble staying asleep.  Getting 4 hours and used to 9 hours. Dread getting up. Using Xanax  Increased Trintellix  to 20 mg 9 days ago. H agrees sleep is critical. Isolating.   Need to be better by July 11. Plan: Clonazepam  1 mg HS. Continue trintellix  20 mg daily she is only been on this dose 9 days and it needs more time. Re lamotrigine  and increase to 150 mg daily.Andrea [name]  Vraylar  1.5 mg every other day Option TMS  03/24/22 appt noted: So much better.  Thinking straighter and not negative and not sad.  Sleeping better 8-9 hours.. No crying. H notices progressively better every day.  Mind clarity is much better.   No SE noted.  No hangover.  No nausea. Found a therapist, Heather Jungling.   Feels well enough to go to [place] and kind of excited.  Back July 16.   Plan: Clonazepam  1 mg HS. This resolved insomnia without SE Continue trintellix  20 mg daily she is only been on this dose 20 days and it needs more time. Re lamotriginecontinue 150 mg daily..    Vraylar  1.5 mg every other day Option TMS  04/12/22 appt noted: Great.  Went to [place].  Was herself and enjoyed it.  Anxiety was managed.   [facility] and liked her. 3# wt gain.   Patient reports stable mood and denies depressed or irritable moods.  Patient denies any recent difficulty with anxiety.  Patient denies difficulty with sleep initiation or maintenance. Denies appetite disturbance.  Patient reports that energy and motivation have been good.  Patient denies any difficulty with concentration.  Patient denies any suicidal ideation. Sleeping well than not anxious. Plan: Trintellix  20  07/10/22 TC depression returned recently.  08/15/22 appt  noted: Still blunted  and diminished motivation.  Some days better than others.  Going to gym.  Partially better.   Still has a lot of anxiety and doesn't want to be alone. Functioning but not normal.  Dreads parties.   SE tremor for a couple of week.sleep great.  Dep 7/10. Current Vraylar  1.5 mg daily, increase lamotrigine  200 mg daily, Trintellix  20 , clonazepam  0.5 mg HS Plan: Reduce Clonazepam  1/2  mg HS. This resolved insomnia without SE Continue trintellix  20 mg daily only mildly effective. lamotrigine  continue 200 mg daily..    DC Vraylar  DT lost response Auvelity  Stop Vraylar  After Thanksgiving stop Trintellix  Wait 3 days then Start Auvelity  1 in the morning for 1 week,  and if no side effects then increase Auvelity  to 1 in the AM and 1 in the PM  09/04/22 TC: Complaining of persistent depression and wanting to do something else to help.  Added lithium  CR 300 mg nightly  09/11/2022 phone call complaining of no improvement with Auvelity  and lithium .  Appointment was moved up.  09/13/22 appt noted: Nothing changed. No better and no worse.  Awakens sad and tearful.  Sleeps well. Pushing herself to the gym.  Has a trainer. On clonazepam  0.5 mg HS She remains anxious when alone for no apparent reason.  Feelings of inferiority.  She is normally extroverted but now she is wanting to isolate from people.  All tasks seem monumental.  No enjoyment and not looking forward to anything.  Everything is a Personal assistant.  She remains generally worried and anxious which is not typical for her. Plan: Clonazepam  1/2  mg HS. This resolved insomnia without SE Stop Auvelity  Start nortriptyline  1 of the 25 mg capsules at night for 4 nights then 2 at night for 4 nights then 3 at night, then wait 1 week and get the blood test in the morning at [facility] Reduce lamotrigine  to 1 and 1/2 tablets daily  Lamotrigine  has not been helpful to him 100 mg daily.  Start reducing lamotrigine  150 mg daily and will continue to  taper at next appointment.  09/27/21 urgent appt :  she wanted urgent appt bc desperate to  feel better.  Seen with H Called at least twice since here for the same reasons of depression. Not sleeping well even with clonazepam  1 mg HS. Doesn't want to be alone. Shakey.   On lithium  300 mg daily, nortriptyline  75 mg HS.   Doesn't want to live like this but not acutely suicidal. Tolerating meds. Plan: pursue [facility] protocol [facility] bc faster optin than alternatives  10/30/22 TC: finished 50 TMS tx in the week and wants appt ASAP bc still struggling with crying and depresssion..  11/03/22 urgent appt : Completed TMS last week. Tue-Thur better days and then Friday nose-dived. Not done well since got back.   H notes high anxiety and crying in AM and PM and doesn't want to be alone.  Xanxax seems to help. Xanax  helps and taking 0.5 mg AM and 1 mg HS H notes memory is sharper and she's more alert after treatment. Very anxioius all the time.  Get up sad.  Exercising. Sleep good with Xanax  1 mg HS. Plan: Increase Lexapro  20 mg dailly (should help crying and maybe anxiety but unlikely to resolve depression) Increase alprazolam  to 0.5 mg TID and 1 mg HS for severe anxiety. H notes it helps. Pramipexole  0.25 BID for 5 days and if NR then 0.5 mg Bid off label. Reduce lamotrigine  100 mg for 2 weeks then 50 mg daily for 2 weeks then stop it. She wants to pursue Spravato  if pramipexole  fails  11/17/22 emergency work in appt: Pramipexole  was not sent in and MD not aware until yesterday.  She is not any better and is desperate for a med change. Sleeping well and taking Xanax  1 mg HS Mornings are worse.  Not crying much.  Last Friday was a bad day and she didn't want to be alone.  But did let her H play golf once this week. Reduced lamotrigine  today to 50 BID and weaning off. H says better this week than last week. Increased Lexapro  to 20 mg daily. Plan: Increased Lexapro  20 mg dailly (helped crying  and maybe anxiety but unlikely to resolve depression) Continue alprazolam  to 0.5 mg TID prn anxiety and 1 mg HS. H notes it helps. Pramipexole  0.25 mg BID for 3 days then 0.5 mg BID Reduce lamotrigine  50 mg daily for 2 weeks then stop it. She wants to pursue Spravato  if pramipexole  fails  12/25/22 appt urgently. COMPLETED 1 WEEK TMS [facility] without benefit except TMS took away brain fog.   She feels need to take Xanax  0.5 mg TID  Some sleepiness with Xanax  . Mornings are better and fine while at the gym but when home gets depressed again.   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 Needs Xanax  to do social things. Plan: Increased Lexapro  20 mg dailly (helped crying and maybe anxiety but unlikely to resolve depression) Continue alprazolam  to 0.5 mg TID prn anxiety and 1 mg HS. H notes it helps. Change pramipexole  0.5mg  tablets, 1 tablet four times daily for 1 week,  Then if not better 1and 1/2 tablets in the AM and dinner and 1 tablet at lunch and dinner.    4/9-4/15 hosp for ruptured appendix and missed meds: Can restart Lexapro  at 1/2 tablet daily for 3 days then 1 daily. Restart 1/2 pramipexole  tablet 3 times daily for 3 days then 1 tablet 3 times daily for 3 days then 1 and 1/2 tablet 3 times daily.      01/12/23 appt noted: No GI sx now.  No appetite but  working on protein.   I think I'm doing real well mentally.  Has resumed meds.  Mood has been great. Pramipexole  0.5mg   1 and 1/2 TID Lexapro  20 Xanax  reduced to 1 mg HS H sees a difference.   No SE with meds.   No med changes  01/25/23 appt noted: Meds:  pramipexole  0.75 mg TID, Xanax  1.5 mg HS and 0.5 mg prn, Lexapro  20 mg daily. Doing great.  Staples removed and healing from surgery appendectomy. Back in the gym helps her.   Satisfied with meds. Getting out socially and feeling like her old self.  Chelsea's H Brad went to ER vomiting blood.   Plan: no med changes  02/07/23 appt noted: Back from [facility] yesterday.  Had ear infection.   Then conjunctivitis.  Eyes hurt.   Before the surgery was getting better and feels like she is sliding back some.  Gradually a little worse since here.  Wonders if she could get better with joy and social drive.   Off Abx .  Sleep is good with meds.  With depression then gets anxious too.  Is functioning.   Meds:  pramipexole  0.75 mg TID, Xanax  1.5 mg HS and 0.5 mg prn, Lexapro  20 mg daily. Consistent and no SE. Plan: Continue Lexapro  20 mg dailly (helped crying and maybe anxiety but unlikely to resolve depression) Continue alprazolam   1 mg HS. H notes it helps. Increase pramipexole  0.5mg  tablets, 2 tablets 3 times daily  01/31/23 TC:Con Andrea [name], CMA  to Me     02/20/23  4:56 PM Note Patient reporting she is not feeling as good as she thinks she should. Her anxiety is the biggest concern. She said she can go to the gym in the mornings and does ok, but she had a closing to go to recently and had to take 1/4 of a Xanax , reports it took the edge off. She reports daily tremors that are helped by Xanax . She is not crying, she is getting things done, sleeping okay. Will have her grandchildren next week and she isn't excited about it like she should be.    Taking 6 mirapex , 1.5 Xanax  and Lexapro  20 mg.     MD resp:  CC   02/20/23  5:48 PM Note We just increased the pramipexole  recently and I see her next week.  I do not want to increase the medicine any further until I see her again.      03/02/23 urgent appt noted: Some improvement with incr pramipexole  1 mg TID. Before felt worse in AM now feels better in AM and goes to gym. Otherwise no excitement.  Hard to make decisions. Less social and smiling.  Subdued more than normal.  Not confident.  Not enjoying present and ruminates in past.   No SE.  No impulsivity Has tremor for awhile .  Is mild for a month. Hard to conc.  Brain fog was better with TMS but is back. Continue Lexapro  20 mg dailly (helped crying and maybe anxiety but unlikely  to resolve depression) Continue alprazolam   1 mg HS. H notes it helps. Increase pramipexole  1 mg QID times daily  03/16/23 appt noted: Meds as above SE: If not enough food makes her excitable, urgent, talking fast.  Does it a bit too much.  Energy fine.   Feels better in the AM at the gym and when home a dark shadow comes over her.   Still feels dep overall.  Went to work with friends and  didn't feel natural and didn't enjoy it like she should.  Pushes herself to stay active.   Takes 1.5 mg Xanax  HS and then awakens and takes another 0.5 mg HS but gets enough sleep. A lot of brain fog.  Feels inferior and doesn't usually feel that way. Infrequent crying spells. Plan:  Continue Lexapro  20 mg dailly (helped crying and maybe anxiety but unlikely to resolve depression) Continue alprazolam   1 mg HS. H notes it helps. Reduce pramipexole  1 mg TID for 3 days, then 0.5 mg QID for 3 days, then 0.5 mg BID for 3 days, then 0.25 mg BID for 3 days, then stop it. Now start Latuda  (lurasidone ) 40 mg 1/2 tablet for 1 week.  If not benefit then 1 tablet daily with 350 calories  04/13/23 urgent appt note: with [facility] 04/05/23 for dep with SI Worst I've ever been.  Feels out of control.  Shaking .  Can't sleep.  Feels disconnected.   Hated being hosp bc locked up.   Taking quetiapine  100 hS and lorazepam and still can't sleep. Started sertraline  50 and stopped Lexapro . Quetiapine  100 mg HS Plan: Stop hydroxyzine Increase quetiapine  200 then 300 mg at night for sleep and depression  Stop lorazepam and return to alprazolam  1 mg at night for sleep Increase sertraline  to 100 mg daily or 2 of 50 mg nightly  04/23/23 appt noted: Here for first appt with Spravato .  Highly anxious about getting Spravato  today.  Doesn't like feeling out of control and worries over this. Still very depressed.  Doesn't think her sx as noted are due to depression.  Thinks she has a neuro problem.  But she has no other neuro sx like  numbness, weakness, balance px, or anything other than dep and anxiety sx. Received Spravato  56 mg today.  It was horrible! Bc she felt out of control and was anxious receiving it.  Thought it would last forever re: dissociation.  It was scary to her.  Couldn't relax for it.  No N, V, HA.  Ambivalent about continuing Spravato  but open. No specific concerns with meds.    04/25/23 appt noted: Needed Xanax  before visit today to overcome fear of coming to the appt. She was more disciplined about controlling neg thought during Spravato  admin and had a much better experience.  It was not scary nor associated with sig fear.  She is still dep and anxious without change otherwise since seen a couple of days ago. Tolerating med changes from last week.   Received Spravato  56 mg again and tolerated it without adverse SE.  Typical SE dissociation, no NV, palpitations, HA.  Motivated to continue the Spravato  and agrees to increase it.  Better experience than the first time. Plan: Increase quetiapine  to 400 mg HS  04/26/23 TC:  Patient taking 300 mg of Seroquel  and 1 mg of Xanax  for sleep. She can get to sleep, just can't stay asleep. Doesn't say how long she is sleeping, but says she needs 9 hours of sleep. Reporting social isolation is bad, she won't leave the house, but doesn't like being home alone.  Reports she has never been this sick mentally and feels like she may need to go to the hospital. No SI, just doesn't like being alone.     MD resp:  Pt just seen yesterday and received 2nd Spravato  56 mg .  That was positive experience. However she is still severely depressed and ruminating and reassurance seeking..  She doesn't need to  go to the hospital. She told me yesterday she slept 7 hours but needs 9 hours.  7 hours is not bad.   She is taking quetiapne 300 mg HS with Xanax  1 mg HS.  No increase in Xanax  but can increase quetiapine  to 400 mg HS (max dose 800) and this could help sleep and anxiety and  depression. She should keep appt next week for next Spravato .  In case she asks, Spravato  is not making her worse in any way.     05/01/23 appt noted;H present also Current meds; quetiapine  400 mg HS, sertraline  100 mg daily, alprazolam  0.5 mg TID and 1 mg HS. Tolerating med changes.    Received Spravato  84 mg first time.  Typical SE dissociation, no NV, palpitations, HA.  However she had a bad experience DT severe anxiety before starting the Spravato .   Very fearful, scared.  Without specific reasons.  Severe anxiety dep, hopeless.  No SI but doesn't want to live like this.  So anxious hard to sleep and some crying spells. Seroquel  not helping sleep.  05/03/23 appt noted: Current meds; quetiapine  400 mg HS, sertraline  100 mg daily, alprazolam  0.5 mg TID and 1 mg HS. Tolerating med changes.    Received Spravato  84 mg.  Typical SE dissociation, no NV, palpitations, HA.  However she had a bad experience DT severe anxiety before starting the Spravato .   Highly anxious, fearful of everything including Spravato  admin.  Afraid she will forget who she is despite having it before.  Still anxious and trouble sleeping at home.  Sleep no better with quetiapine  and still ruminatiing.   Plan increase quetiapine  to 500 mg HS  05/08/23 appt noted: Meds: quetiapine  500 mg HS, sertraline  100 mg daily, alprazolam  0.5 mg TID and 1 mg HS. Received Spravato  84 mg 3rd time.  Typical SE dissociation, no NV, palpitations, HA.  However she had a bad experience DT severe anxiety before starting the Spravato .   She is not better so far.  Ongoing severe depression anxiety, avoidance, anhedonia, rumination, afraid to be alone. All not typical of herself.   No SE with meds except constipation and sweating at night.  Plan:  Increase quetiapine  to 600 mg at night for sleep and depression & rumination and next week to 600 HS increased alprazolam  1 mg at night for sleep and 0.5 mg TID Increase sertraline  150 mg daily   Spravato  84 mg AM twice weekly for severe TRD  05/10/23 appt noted:  Meds: quetiapine  600 mg HS, sertraline  150 mg daily, alprazolam  0.5 mg TID and 1 mg HS. Received Spravato  84 mg 4th time.  Typical SE dissociation, no NV, palpitations, HA.  However she had a bad experience DT severe anxiety before starting the Spravato .   She is not better so far.  Ongoing severe depression anxiety, avoidance, anhedonia, rumination, afraid to be alone. All not typical of herself.   SE constipation she finds unmanageable  Did sleep better with increased quetiapine  600 mg Hs but cannot continue dT constipation.  However dep and anxiety are no better with Spravato  or the other treatments except better sleep.  Anxiety ongoing severe with rumination.  05/17/23 appt noted:  Meds: switched to olanzapine  15 mg HS, sertraline  150, alprazolam   Tremor seems worse but H disagrees. Mood continually worse.  She is dependent on H and afraid to be alone.  No sweating. Xanax  helped but wears off after a couple of hours.  She is open to antoher option Tolerating med  changes. No improvement except slept a little better last night but still not enough. No Spravato  today bc 2 weeks of it didn't help.  Agrees to pursue ECT but hoping med changes will help Crying spells.  07/10/23 appt noted: Great.  Andrea [name] back at work.  Playing golf again.   Med: sertraline  200, propranolol  10 BID, olanzapine  15 pm, clonazepam  1 mg HS Did not require ECT.  All of a sudden something clicked and gradually better .  Doesn't even recognize the person she was with depression.   No negative thoughts.  Laughing again.  More social and talking again.  Happy in the am.  Sleep very well 10-11 hours.  Everything back to normal .  Doesn't need Andrea [name] with her now.  Ok being alone.  Anxiety gone also. SE carb craving and wt gain 10#.   Plan: Propranolol  10 mg tablet 2-3 tablets twice daily as needed for tremor Continue clonazepam  1 mg tablet 1/2 twice  daily and 1 tablet at night Continue sertraline  200 tablets daily. Switch olanzapine  15 to Lybalvi 10 DT wt gain.    08/10/23 appt noted: Psych med: sertraline  200, propranolol  10 BID, Lybalvi 10 pm, clonazepam  1 mg HS Up to 141# with Lybalvi.  Went to wt reducing clinic and got shot semaglutide.  $75/ week.  That took appetite away.   Was a little more down after a couple of missed doses of Lybalvi. Mood had been great  until the missed dose.  Sleeping a lot.  No problems with change from olanzapine  15 to Lybalvi 10-10 daily but no benefit with appetite reduction.   Party tomorrow night.  Got house decorated yesterday.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

## 2024-05-02 NOTE — Patient Instructions (Addendum)
 Increase olanzapine  to 2 of the 5 mg tablets. Add Celebrex  400 mg daily with food Use alprazolam  1 at night if needed for sleep

## 2024-05-08 ENCOUNTER — Telehealth: Payer: Self-pay | Admitting: Psychiatry

## 2024-05-08 NOTE — Telephone Encounter (Signed)
 Pt called at 4:45p asking if there is a lesser dose of Concerta  she can try.  She is taking 54mg  and she said it's making her nervous, shaky and keeping her awake.  Next appt 8/26

## 2024-05-09 ENCOUNTER — Other Ambulatory Visit: Payer: Self-pay | Admitting: Psychiatry

## 2024-05-09 DIAGNOSIS — F339 Major depressive disorder, recurrent, unspecified: Secondary | ICD-10-CM

## 2024-05-09 MED ORDER — METHYLPHENIDATE HCL ER (OSM) 27 MG PO TBCR: 27.0000 mg | EXTENDED_RELEASE_TABLET | ORAL | 0 refills | Status: DC

## 2024-05-09 NOTE — Telephone Encounter (Signed)
 Sent Concerta  27 mg AM

## 2024-05-09 NOTE — Telephone Encounter (Signed)
 Notified patient of Rx for lowered dose of Concerta . She had already been contacted by pharmacy.

## 2024-05-09 NOTE — Telephone Encounter (Signed)
 Pt last seen 8/8 and the following changes made:  Increase olanzapine  to 2 of the 5 mg tablets. Add Celebrex  400 mg daily with food Use alprazolam  1 at night if needed for sleep  She is s/p hysterectomy, prior to surgery had reported feeling good. From a surgery standpoint she is doing well.  She feels like the Concerta  dose is too high and she isn't sleeping, nervous and jittery. Reports not taking for 2 days and sx not improved. She reports it feels like she has a cord around her neck, worse in the morning, but occurs daily. Doesn't want to be by herself.  Rates dep as 8/10, when asked to rate anxiety she said really bad.  Has FU 8/26.

## 2024-05-11 ENCOUNTER — Other Ambulatory Visit: Payer: Self-pay | Admitting: Psychiatry

## 2024-05-19 DIAGNOSIS — Z4889 Encounter for other specified surgical aftercare: Secondary | ICD-10-CM | POA: Diagnosis not present

## 2024-05-20 ENCOUNTER — Ambulatory Visit: Admitting: Psychiatry

## 2024-05-20 DIAGNOSIS — F5105 Insomnia due to other mental disorder: Secondary | ICD-10-CM | POA: Diagnosis not present

## 2024-05-20 DIAGNOSIS — F411 Generalized anxiety disorder: Secondary | ICD-10-CM | POA: Diagnosis not present

## 2024-05-20 DIAGNOSIS — F339 Major depressive disorder, recurrent, unspecified: Secondary | ICD-10-CM

## 2024-05-20 NOTE — Progress Notes (Signed)
 Andrea [name] [id number] 12/11/1950 73 y.o.   Subjective:   Patient ID:  Andrea [name] is a 73 y.o. (DOB [birth year]) female.  Chief Complaint:  Chief Complaint  Patient presents with   Follow-up   Depression   Anxiety   Fatigue    HPI Andrea [name] presents to the office today for follow-up of MDE and anxiety disorder.  seen 09/2019.  No meds were changed.  Been on Lexapro  20 since early 2017.  02/27/2020 phone call from patient: Pt experiencing more anxiety than she normally has. Pt would like to know what are some options that she has as far as her medication. Pt will be in an appt  MD response: If the anxiety is just occasional then using alprazolam  as needed would be an okay thing to do.  If it is been fairly persistent for a couple of weeks or more than the simplest solution would be to increase the Lexapro  to 1-1/2 tablets daily.  Historically she has done well on Lexapro  20 mg daily for an extended period of time.  If she is under temporary stress then once that is resolved we could drop it back to 1 daily.  If this does not work as expected then she should schedule an earlier appointment. Pt response: Patient called back and she's been having temporary stress, but it's not bad. Things are great, she's working out and feeling okay for the most part. It's been going on for a bit so she agrees to the increase Lexapro  20 mg 1.5 tablets daily. She has been taking alprazolam  at hs occasionally to help sleep better and it does help.   12/06/2020 appointment with the following noted:  No covid.  H Covid and recovered.   Had increased Lexapro  to 30 mg for 90 days and saw a difference but started seeing weight gain and was doing oK and able to drop back and been OK. Still renovating a house for a year.  Sold her house and mourns the house. Calmed down.  Kept 3 gkids and dogs 10 days.   Andrea [name] Hopes for new house May 1. Rare alprazolam  for HS. Stress moving to condo and would have crying spell  over the move and some problems she was running into dealing with it.  Hard with change.  But taking time to adjust to the idea of moving from her house of 23 years.  Initially refused but he left it up to her and she's come to peace about it.  Andrea [name].   Good response to medication and no SE.  Exercising is good medicine.  If doesn't then doesn't feel as good. Satisfied. Plan: no med changes  08/01/21  appt noted:  seen with [name] died 2024-08-04.  A lot of ups and downs per D.  Hard dips. Got worse when retired and dealing with the new  house.  Had trouble getting rid of things from the old house causing regrets over sellling the house.  D notes doesn't do well with instability  and stressors.  Ruminates on past mistakes. Blamed [name] for the  difficulty renovating the house.  Neg self talk and beats herself up. When not enough sleep will get depressed.  Does better if busy.   Dreads things.  Patient denies difficulty with sleep initiation or maintenance. Denies appetite disturbance.  Patient reports that energy and motivation have been good. Patient denies any difficulty with concentration.  Patient denies any suicidal ideation.  Plan:  Abilify  2.5 mg daily for a week, then 5 mg daily. Continue Lexapro  20 mg daily  08/31/2021 appt noted: It worked immediately.  Insomnia with EMA. Normally 9 hours.  It's like I'm high.  Not tired.  Average 3-4 hours. Not depressed and no crazy negative thoughts.  Good response.  Energetic but up in middle of night. Plan: Okay to stop Abilify  because of insomnia.   09/29/2021 appointment with the following noted: Parties were great.  Sleep problem resolved.  A lot of rest at the beach. Maybe the last week or 2 less motivation and socialization.  Not laying in the bed.  Just not as good as she was.  Not as outgoing as normal. No alcohol now.  Only had 1-2 at night.  Not ruminating.   Willing to restart Abilify  at lower dose 2 mg daily. 3 grandsons  Plan:  Relapse depression recurs she can resume Abilify   but lower dose 2 mg daily or every other day to try to avoid the insomnia where she can contact our office and we can discussed the possibility of an alternative antidepressant such as Trintellix  or duloxetine .  12/28/2021 appointment noted: Several phone calls since she was here.  Abilify  plus Lexapro  failed.  Switch to duloxetine  90 mg which she started 12/02/2018 2023 Started lamotrigine . Also started Rexulti  Sister has had cycling depression that has responded well to lamotrigine  with poor response to SSRIs [name] with a little dementia. SE tremor 90 mg duloxetine , ? Wt gain over a few weeks.. Exercise and wt control Social isolation, low self esteem, worry about the way she looks and not usually that way. Crying better with duloxetine .  More dependent on H.   Anhedonia.  Black hole.  Never this low. No SI Having to use Xanax .   Asks about day treatment and TMS Plan: rec TMS Continue lamotrigine  as prescribed Increase Rexulti  to 1 mg daily Switch to Trintellix :  reduce duloxetine  to 2 of the 30 mg capsules and start Trintellix  5 mg daily for 5 days,  Then Increase Trintellix  to 10 mg daily and reduce duloxetine  to 1 capsule for 1 week. Then stop duloxetine .  02/15/22 appt noted: On Trintellix  , Rexulti  1 and lamotrigine  I feel good.  Walks 3 miles daily is great for mental health. TMS would not be covered by [facility]. Started feeling better after a week or 2 after the last visit. SE appetite increased. No nausea. Not crying anymore. Sleeping well and no longer ruminates. Plan: Continue Trintellix  but DC Rexulti  due to weight gain.  Continue lamotrigine   03/06/2022 appointment with the following noted: Last 2 weeks has been very sad and nervous. Trintellix  was increased to 20 mg daily  03/10/2022 complaining of ongoing depression, feeling jittery, negative thinking, early morning awakening, ruminating about past decisions.  Wanting to consider  TMS because she is desperate for improvement. Plan we will schedule urgent appointment  03/15/22 urgent appt noted:  seen with Andrea [name] Very depressed, worst ever.  Don't want to be alone, ongoing worry, ruminating on past decisions.  Racing negative thoughts.  No motivation.  Trouble staying asleep.  Getting 4 hours and used to 9 hours. Dread getting up. Using Xanax  Increased Trintellix  to 20 mg 9 days ago. H agrees sleep is critical. Isolating.   Need to be better by July 11. Plan: Clonazepam  1 mg HS. Continue trintellix  20 mg daily she is only been on this dose 9 days and it needs more time. Re lamotrigine  and increase to  150 mg daily..    Vraylar  1.5 mg every other day Option TMS  03/24/22 appt noted: So much better.  Thinking straighter and not negative and not sad.  Sleeping better 8-9 hours.. No crying. H notices progressively better every day.  Mind clarity is much better.   No SE noted.  No hangover.  No nausea. Found a therapist, Heather Jungling.   Feels well enough to go to [place] and kind of excited.  Back July 16.   Plan: Clonazepam  1 mg HS. This resolved insomnia without SE Continue trintellix  20 mg daily she is only been on this dose 20 days and it needs more time. Re lamotriginecontinue 150 mg daily..    Vraylar  1.5 mg every other day Option TMS  04/12/22 appt noted: Great.  Went to [place].  Was herself and enjoyed it.  Anxiety was managed.   [facility] and liked her. 3# wt gain.   Patient reports stable mood and denies depressed or irritable moods.  Patient denies any recent difficulty with anxiety.  Patient denies difficulty with sleep initiation or maintenance. Denies appetite disturbance.  Patient reports that energy and motivation have been good.  Patient denies any difficulty with concentration.  Patient denies any suicidal ideation. Sleeping well than not anxious. Plan: Trintellix  20  07/10/22 TC depression returned recently.  08/15/22  appt noted: Still blunted  and diminished motivation.  Some days better than others.  Going to gym.  Partially better.   Still has a lot of anxiety and doesn't want to be alone. Functioning but not normal.  Dreads parties.   SE tremor for a couple of week.sleep great.  Dep 7/10. Current Vraylar  1.5 mg daily, increase lamotrigine  200 mg daily, Trintellix  20 , clonazepam  0.5 mg HS Plan: Reduce Clonazepam  1/2  mg HS. This resolved insomnia without SE Continue trintellix  20 mg daily only mildly effective. lamotrigine  continue 200 mg daily..    DC Vraylar  DT lost response Auvelity  Stop Vraylar  After Thanksgiving stop Trintellix  Wait 3 days then Start Auvelity  1 in the morning for 1 week,  and if no side effects then increase Auvelity  to 1 in the AM and 1 in the PM  09/04/22 TC: Complaining of persistent depression and wanting to do something else to help.  Added lithium  CR 300 mg nightly  09/11/2022 phone call complaining of no improvement with Auvelity  and lithium .  Appointment was moved up.  09/13/22 appt noted: Nothing changed. No better and no worse.  Awakens sad and tearful.  Sleeps well. Pushing herself to the gym.  Has a trainer. On clonazepam  0.5 mg HS She remains anxious when alone for no apparent reason.  Feelings of inferiority.  She is normally extroverted but now she is wanting to isolate from people.  All tasks seem monumental.  No enjoyment and not looking forward to anything.  Everything is a Personal assistant.  She remains generally worried and anxious which is not typical for her. Plan: Clonazepam  1/2  mg HS. This resolved insomnia without SE Stop Auvelity  Start nortriptyline  1 of the 25 mg capsules at night for 4 nights then 2 at night for 4 nights then 3 at night, then wait 1 week and get the blood test in the morning at [facility] Reduce lamotrigine  to 1 and 1/2 tablets daily  Lamotrigine  has not been helpful to him 100 mg daily.  Start reducing lamotrigine  150 mg daily and will continue  to taper at next appointment.  09/27/21 urgent appt :  she  wanted urgent appt bc desperate to feel better.  Seen with H Called at least twice since here for the same reasons of depression. Not sleeping well even with clonazepam  1 mg HS. Doesn't want to be alone. Shakey.   On lithium  300 mg daily, nortriptyline  75 mg HS.   Doesn't want to live like this but not acutely suicidal. Tolerating meds. Plan: pursue [place] protocol TMS at [facility] bc faster optin than alternatives  10/30/22 TC: finished 50 TMS tx in the week and wants appt ASAP bc still struggling with crying and depresssion..  11/03/22 urgent appt : Completed TMS last week. Tue-Thur better days and then Friday nose-dived. Not done well since got back.   H notes high anxiety and crying in AM and PM and doesn't want to be alone.  Xanxax seems to help. Xanax  helps and taking 0.5 mg AM and 1 mg HS H notes memory is sharper and she's more alert after treatment. Very anxioius all the time.  Get up sad.  Exercising. Sleep good with Xanax  1 mg HS. Plan: Increase Lexapro  20 mg dailly (should help crying and maybe anxiety but unlikely to resolve depression) Increase alprazolam  to 0.5 mg TID and 1 mg HS for severe anxiety. H notes it helps. Pramipexole  0.25 BID for 5 days and if NR then 0.5 mg Bid off label. Reduce lamotrigine  100 mg for 2 weeks then 50 mg daily for 2 weeks then stop it. She wants to pursue Spravato  if pramipexole  fails  11/17/22 emergency work in appt: Pramipexole  was not sent in and MD not aware until yesterday.  She is not any better and is desperate for a med change. Sleeping well and taking Xanax  1 mg HS Mornings are worse.  Not crying much.  Last Friday was a bad day and she didn't want to be alone.  But did let her H play golf once this week. Reduced lamotrigine  today to 50 BID and weaning off. H says better this week than last week. Increased Lexapro  to 20 mg daily. Plan: Increased Lexapro  20 mg dailly (helped  crying and maybe anxiety but unlikely to resolve depression) Continue alprazolam  to 0.5 mg TID prn anxiety and 1 mg HS. H notes it helps. Pramipexole  0.25 mg BID for 3 days then 0.5 mg BID Reduce lamotrigine  50 mg daily for 2 weeks then stop it. She wants to pursue Spravato  if pramipexole  fails  12/25/22 appt urgently. COMPLETED 1 WEEK TMS [facility] without benefit except TMS took away brain fog.   She feels need to take Xanax  0.5 mg TID  Some sleepiness with Xanax  . Mornings are better and fine while at the gym but when home gets depressed again.   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 Needs Xanax  to do social things. Plan: Increased Lexapro  20 mg dailly (helped crying and maybe anxiety but unlikely to resolve depression) Continue alprazolam  to 0.5 mg TID prn anxiety and 1 mg HS. H notes it helps. Change pramipexole  0.5mg  tablets, 1 tablet four times daily for 1 week,  Then if not better 1and 1/2 tablets in the AM and dinner and 1 tablet at lunch and dinner.    4/9-4/15 hosp for ruptured appendix and missed meds: Can restart Lexapro  at 1/2 tablet daily for 3 days then 1 daily. Restart 1/2 pramipexole  tablet 3 times daily for 3 days then 1 tablet 3 times daily for 3 days then 1 and 1/2 tablet 3 times daily.      01/12/23 appt noted: No GI  sx now.  No appetite but working on protein.   I think I'm doing real well mentally.  Has resumed meds.  Mood has been great. Pramipexole  0.5mg   1 and 1/2 TID Lexapro  20 Xanax  reduced to 1 mg HS H sees a difference.   No SE with meds.   No med changes  01/25/23 appt noted: Meds:  pramipexole  0.75 mg TID, Xanax  1.5 mg HS and 0.5 mg prn, Lexapro  20 mg daily. Doing great.  Staples removed and healing from surgery appendectomy. Back in the gym helps her.   Satisfied with meds. Getting out socially and feeling like her old self.  Chelsea's H Brad went to ER vomiting blood.   Plan: no med changes  02/07/23 appt noted: Back from [facility] yesterday.  Had ear  infection.  Then conjunctivitis.  Eyes hurt.   Before the surgery was getting better and feels like she is sliding back some.  Gradually a little worse since here.  Wonders if she could get better with joy and social drive.   Off Abx .  Sleep is good with meds.  With depression then gets anxious too.  Is functioning.   Meds:  pramipexole  0.75 mg TID, Xanax  1.5 mg HS and 0.5 mg prn, Lexapro  20 mg daily. Consistent and no SE. Plan: Continue Lexapro  20 mg dailly (helped crying and maybe anxiety but unlikely to resolve depression) Continue alprazolam   1 mg HS. H notes it helps. Increase pramipexole  0.5mg  tablets, 2 tablets 3 times daily  01/31/23 TC:Con Dawna DASEN, CMA  to Me     02/20/23  4:56 PM Note Patient reporting she is not feeling as good as she thinks she should. Her anxiety is the biggest concern. She said she can go to the gym in the mornings and does ok, but she had a closing to go to recently and had to take 1/4 of a Xanax , reports it took the edge off. She reports daily tremors that are helped by Xanax . She is not crying, she is getting things done, sleeping okay. Will have her grandchildren next week and she isn't excited about it like she should be.    Taking 6 mirapex , 1.5 Xanax  and Lexapro  20 mg.     MD resp:  CC   02/20/23  5:48 PM Note We just increased the pramipexole  recently and I see her next week.  I do not want to increase the medicine any further until I see her again.      03/02/23 urgent appt noted: Some improvement with incr pramipexole  1 mg TID. Before felt worse in AM now feels better in AM and goes to gym. Otherwise no excitement.  Hard to make decisions. Less social and smiling.  Subdued more than normal.  Not confident.  Not enjoying present and ruminates in past.   No SE.  No impulsivity Has tremor for awhile .  Is mild for a month. Hard to conc.  Brain fog was better with TMS but is back. Continue Lexapro  20 mg dailly (helped crying and maybe anxiety but  unlikely to resolve depression) Continue alprazolam   1 mg HS. H notes it helps. Increase pramipexole  1 mg QID times daily  03/16/23 appt noted: Meds as above SE: If not enough food makes her excitable, urgent, talking fast.  Does it a bit too much.  Energy fine.   Feels better in the AM at the gym and when home a dark shadow comes over her.   Still feels dep overall.  Went to work with friends and didn't feel natural and didn't enjoy it like she should.  Pushes herself to stay active.   Takes 1.5 mg Xanax  HS and then awakens and takes another 0.5 mg HS but gets enough sleep. A lot of brain fog.  Feels inferior and doesn't usually feel that way. Infrequent crying spells. Plan:  Continue Lexapro  20 mg dailly (helped crying and maybe anxiety but unlikely to resolve depression) Continue alprazolam   1 mg HS. H notes it helps. Reduce pramipexole  1 mg TID for 3 days, then 0.5 mg QID for 3 days, then 0.5 mg BID for 3 days, then 0.25 mg BID for 3 days, then stop it. Now start Latuda  (lurasidone ) 40 mg 1/2 tablet for 1 week.  If not benefit then 1 tablet daily with 350 calories  04/13/23 urgent appt note: with [facility] 04/05/23 for dep with SI Worst I've ever been.  Feels out of control.  Shaking .  Can't sleep.  Feels disconnected.   Hated being hosp bc locked up.   Taking quetiapine  100 hS and lorazepam and still can't sleep. Started sertraline  50 and stopped Lexapro . Quetiapine  100 mg HS Plan: Stop hydroxyzine Increase quetiapine  200 then 300 mg at night for sleep and depression  Stop lorazepam and return to alprazolam  1 mg at night for sleep Increase sertraline  to 100 mg daily or 2 of 50 mg nightly  04/23/23 appt noted: Here for first appt with Spravato .  Highly anxious about getting Spravato  today.  Doesn't like feeling out of control and worries over this. Still very depressed.  Doesn't think her sx as noted are due to depression.  Thinks she has a neuro problem.  But she has no other neuro sx  like numbness, weakness, balance px, or anything other than dep and anxiety sx. Received Spravato  56 mg today.  It was horrible! Bc she felt out of control and was anxious receiving it.  Thought it would last forever re: dissociation.  It was scary to her.  Couldn't relax for it.  No N, V, HA.  Ambivalent about continuing Spravato  but open. No specific concerns with meds.    04/25/23 appt noted: Needed Xanax  before visit today to overcome fear of coming to the appt. She was more disciplined about controlling neg thought during Spravato  admin and had a much better experience.  It was not scary nor associated with sig fear.  She is still dep and anxious without change otherwise since seen a couple of days ago. Tolerating med changes from last week.   Received Spravato  56 mg again and tolerated it without adverse SE.  Typical SE dissociation, no NV, palpitations, HA.  Motivated to continue the Spravato  and agrees to increase it.  Better experience than the first time. Plan: Increase quetiapine  to 400 mg HS  04/26/23 TC:  Patient taking 300 mg of Seroquel  and 1 mg of Xanax  for sleep. She can get to sleep, just can't stay asleep. Doesn't say how long she is sleeping, but says she needs 9 hours of sleep. Reporting social isolation is bad, she won't leave the house, but doesn't like being home alone.  Reports she has never been this sick mentally and feels like she may need to go to the hospital. No SI, just doesn't like being alone.     MD resp:  Pt just seen yesterday and received 2nd Spravato  56 mg .  That was positive experience. However she is still severely depressed and ruminating and reassurance  seeking..  She doesn't need to go to the hospital. She told me yesterday she slept 7 hours but needs 9 hours.  7 hours is not bad.   She is taking quetiapne 300 mg HS with Xanax  1 mg HS.  No increase in Xanax  but can increase quetiapine  to 400 mg HS (max dose 800) and this could help sleep and anxiety and  depression. She should keep appt next week for next Spravato .  In case she asks, Spravato  is not making her worse in any way.     05/01/23 appt noted;H present also Current meds; quetiapine  400 mg HS, sertraline  100 mg daily, alprazolam  0.5 mg TID and 1 mg HS. Tolerating med changes.    Received Spravato  84 mg first time.  Typical SE dissociation, no NV, palpitations, HA.  However she had a bad experience DT severe anxiety before starting the Spravato .   Very fearful, scared.  Without specific reasons.  Severe anxiety dep, hopeless.  No SI but doesn't want to live like this.  So anxious hard to sleep and some crying spells. Seroquel  not helping sleep.  05/03/23 appt noted: Current meds; quetiapine  400 mg HS, sertraline  100 mg daily, alprazolam  0.5 mg TID and 1 mg HS. Tolerating med changes.    Received Spravato  84 mg.  Typical SE dissociation, no NV, palpitations, HA.  However she had a bad experience DT severe anxiety before starting the Spravato .   Highly anxious, fearful of everything including Spravato  admin.  Afraid she will forget who she is despite having it before.  Still anxious and trouble sleeping at home.  Sleep no better with quetiapine  and still ruminatiing.   Plan increase quetiapine  to 500 mg HS  05/08/23 appt noted: Meds: quetiapine  500 mg HS, sertraline  100 mg daily, alprazolam  0.5 mg TID and 1 mg HS. Received Spravato  84 mg 3rd time.  Typical SE dissociation, no NV, palpitations, HA.  However she had a bad experience DT severe anxiety before starting the Spravato .   She is not better so far.  Ongoing severe depression anxiety, avoidance, anhedonia, rumination, afraid to be alone. All not typical of herself.   No SE with meds except constipation and sweating at night.  Plan:  Increase quetiapine  to 600 mg at night for sleep and depression & rumination and next week to 600 HS increased alprazolam  1 mg at night for sleep and 0.5 mg TID Increase sertraline  150 mg daily   Spravato  84 mg AM twice weekly for severe TRD  05/10/23 appt noted:  Meds: quetiapine  600 mg HS, sertraline  150 mg daily, alprazolam  0.5 mg TID and 1 mg HS. Received Spravato  84 mg 4th time.  Typical SE dissociation, no NV, palpitations, HA.  However she had a bad experience DT severe anxiety before starting the Spravato .   She is not better so far.  Ongoing severe depression anxiety, avoidance, anhedonia, rumination, afraid to be alone. All not typical of herself.   SE constipation she finds unmanageable  Did sleep better with increased quetiapine  600 mg Hs but cannot continue dT constipation.  However dep and anxiety are no better with Spravato  or the other treatments except better sleep.  Anxiety ongoing severe with rumination.  05/17/23 appt noted:  Meds: switched to olanzapine  15 mg HS, sertraline  150, alprazolam   Tremor seems worse but H disagrees. Mood continually worse.  She is dependent on H and afraid to be alone.  No sweating. Xanax  helped but wears off after a couple of hours.  She is  open to antoher option Tolerating med changes. No improvement except slept a little better last night but still not enough. No Spravato  today bc 2 weeks of it didn't help.  Agrees to pursue ECT but hoping med changes will help Crying spells.  07/10/23 appt noted: Great.  Gerlean back at work.  Playing golf again.   Med: sertraline  200, propranolol  10 BID, olanzapine  15 pm, clonazepam  1 mg HS Did not require ECT.  All of a sudden something clicked and gradually better .  Doesn't even recognize the person she was with depression.   No negative thoughts.  Laughing again.  More social and talking again.  Happy in the am.  Sleep very well 10-11 hours.  Everything back to normal .  Doesn't need Gerlean with her now.  Ok being alone.  Anxiety gone also. SE carb craving and wt gain 10#.   Plan: Propranolol  10 mg tablet 2-3 tablets twice daily as needed for tremor Continue clonazepam  1 mg tablet 1/2 twice  daily and 1 tablet at night Continue sertraline  200 tablets daily. Switch olanzapine  15 to Lybalvi 10 DT wt gain.    08/10/23 appt noted: Psych med: sertraline  200, propranolol  10 BID, Lybalvi 10 pm, clonazepam  1 mg HS Up to 141# with Lybalvi.  Went to wt reducing clinic and got shot semaglutide.  $75/ week.  That took appetite away.   Was a little more down after a couple of missed doses of Lybalvi. Mood had been great  until the missed dose.  Sleeping a lot.  No problems with change from olanzapine  15 to Lybalvi 10-10 daily but no benefit with appetite reduction.   Party tomorrow night.  Got house decorated yesterday.   Since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

## 2024-05-20 NOTE — Patient Instructions (Addendum)
 Parnate and would stop Concerta , wean quetiapine , wean Zoloft , and stop olanzapine .    For now reduce quetiapine  to 1 of 300 mg tablets. Reduce sertraline  (Zoloft ) to 1 daily for 10 days, then 1/2 daily.  Continue Concerta  for now.   Call if anything gets worse and we can reduce more slowly  Call back with the information for the second opinion so we can send records.

## 2024-05-22 ENCOUNTER — Telehealth (INDEPENDENT_AMBULATORY_CARE_PROVIDER_SITE_OTHER): Payer: Self-pay | Admitting: Psychiatry

## 2024-05-22 DIAGNOSIS — F419 Anxiety disorder, unspecified: Secondary | ICD-10-CM | POA: Diagnosis not present

## 2024-05-22 DIAGNOSIS — F339 Major depressive disorder, recurrent, unspecified: Secondary | ICD-10-CM

## 2024-05-22 DIAGNOSIS — F5105 Insomnia due to other mental disorder: Secondary | ICD-10-CM

## 2024-05-22 DIAGNOSIS — F411 Generalized anxiety disorder: Secondary | ICD-10-CM

## 2024-05-22 NOTE — Telephone Encounter (Signed)
 Pt seen 2 days ago. Plan: Parnate and would stop Concerta , wean quetiapine , wean Zoloft , and stop olanzapine .   For now reduce quetiapine  to 1 of 300 mg tablets. Reduce sertraline  (Zoloft ) to 1 daily for 10 days, then 1/2 daily.   Will plan to stop and then pursue Parnate unless second opinion offers a better alternative. Continue Concerta  for now.   Continue Celebrex  for now  She reports not sleeping with quetiapine  reduction and she is depressed and crying. She reports consult at [facility] was changed to 9/22.

## 2024-05-22 NOTE — Telephone Encounter (Signed)
 Pt said she was told to call if her meds were not working. She said he reduced some of her meds and it hasn't had a good effect. When I asked which medication she said all of them.

## 2024-05-23 NOTE — Telephone Encounter (Signed)
 RTC:  Virtual Visit via Telephone Note  I connected with pt by telephone and verified that I am speaking with the correct person using two identifiers.   I discussed the limitations, risks, security and privacy concerns of performing an evaluation and management service by telephone and the availability of in person appointments. I also discussed with the patient that there may be a patient responsible charge related to this service. The patient expressed understanding and agreed to proceed.  I discussed the assessment and treatment plan with the patient. The patient was provided an opportunity to ask questions and all were answered. The patient agreed with the plan and demonstrated an understanding of the instructions.   The patient was advised to call back or seek an in-person evaluation if the symptoms worsen or if the condition fails to improve as anticipated.  I provided 15 minutes of non-face-to-face time during this encounter. The call started at 645 and ended at 700p. The patient was located at home and the provider was located office.   Starting feeling worse with reduction psych meds.  Energy low post surgery.  More nervous  and dep with less med.   Didn't sleep with less meds.  Can't go without sleep.  Can't nap bc too anxious.    Disc options of continuing the change vs continuing meds until [facility] consult in 9/22.   Continue meds until consult.  She agrees.  Mse: no psychosis.  Cog intact .  No SI. Mor eanxiety and dep with less meds  Lorene Macintosh, MD, DFAPA

## 2024-05-27 ENCOUNTER — Other Ambulatory Visit: Payer: Self-pay | Admitting: Psychiatry

## 2024-05-27 DIAGNOSIS — F5105 Insomnia due to other mental disorder: Secondary | ICD-10-CM

## 2024-05-28 DIAGNOSIS — D2261 Melanocytic nevi of right upper limb, including shoulder: Secondary | ICD-10-CM | POA: Diagnosis not present

## 2024-05-28 DIAGNOSIS — Z85828 Personal history of other malignant neoplasm of skin: Secondary | ICD-10-CM | POA: Diagnosis not present

## 2024-05-28 DIAGNOSIS — D225 Melanocytic nevi of trunk: Secondary | ICD-10-CM | POA: Diagnosis not present

## 2024-05-28 DIAGNOSIS — L538 Other specified erythematous conditions: Secondary | ICD-10-CM | POA: Diagnosis not present

## 2024-05-28 DIAGNOSIS — L820 Inflamed seborrheic keratosis: Secondary | ICD-10-CM | POA: Diagnosis not present

## 2024-05-28 DIAGNOSIS — D2272 Melanocytic nevi of left lower limb, including hip: Secondary | ICD-10-CM | POA: Diagnosis not present

## 2024-05-28 DIAGNOSIS — D2262 Melanocytic nevi of left upper limb, including shoulder: Secondary | ICD-10-CM | POA: Diagnosis not present

## 2024-05-29 ENCOUNTER — Ambulatory Visit: Payer: Self-pay | Admitting: General Surgery

## 2024-05-29 DIAGNOSIS — K432 Incisional hernia without obstruction or gangrene: Secondary | ICD-10-CM | POA: Diagnosis not present

## 2024-06-03 ENCOUNTER — Telehealth: Payer: Self-pay | Admitting: Psychiatry

## 2024-06-03 NOTE — Telephone Encounter (Signed)
 Pt lvm requesting Rx for Concerta  27 mg. Has 3 left. [facility] [address] [place]  [address]

## 2024-06-04 NOTE — Telephone Encounter (Signed)
LF 8/15, due 9/12

## 2024-06-05 ENCOUNTER — Telehealth: Payer: Self-pay | Admitting: Psychiatry

## 2024-06-05 NOTE — Telephone Encounter (Signed)
 Called patient and told her that she is not due for a RF until 9/12. She said she would run out Sunday. I told her that should be correct, since she can only get RF every 28 days and Sunday would be 30 days.

## 2024-06-05 NOTE — Telephone Encounter (Signed)
 Next visit is 06/13/24. Requesting refill on Concerta  27 mg called to:   [id number] [place], [place] - [address]    Phone: [phone]  Fax: [phone]

## 2024-06-06 ENCOUNTER — Other Ambulatory Visit: Payer: Self-pay

## 2024-06-06 DIAGNOSIS — F339 Major depressive disorder, recurrent, unspecified: Secondary | ICD-10-CM

## 2024-06-06 MED ORDER — METHYLPHENIDATE HCL ER (OSM) 27 MG PO TBCR: 27.0000 mg | EXTENDED_RELEASE_TABLET | ORAL | 0 refills | Status: DC

## 2024-06-06 NOTE — Telephone Encounter (Signed)
Addressed via another message

## 2024-06-06 NOTE — Telephone Encounter (Signed)
 Pended

## 2024-06-09 DIAGNOSIS — R8290 Unspecified abnormal findings in urine: Secondary | ICD-10-CM | POA: Diagnosis not present

## 2024-06-09 DIAGNOSIS — Z20822 Contact with and (suspected) exposure to covid-19: Secondary | ICD-10-CM | POA: Diagnosis not present

## 2024-06-09 DIAGNOSIS — J0110 Acute frontal sinusitis, unspecified: Secondary | ICD-10-CM | POA: Diagnosis not present

## 2024-06-12 DIAGNOSIS — H04123 Dry eye syndrome of bilateral lacrimal glands: Secondary | ICD-10-CM | POA: Diagnosis not present

## 2024-06-12 DIAGNOSIS — H43813 Vitreous degeneration, bilateral: Secondary | ICD-10-CM | POA: Diagnosis not present

## 2024-06-12 DIAGNOSIS — H2513 Age-related nuclear cataract, bilateral: Secondary | ICD-10-CM | POA: Diagnosis not present

## 2024-06-12 DIAGNOSIS — Z9889 Other specified postprocedural states: Secondary | ICD-10-CM | POA: Diagnosis not present

## 2024-06-13 ENCOUNTER — Ambulatory Visit (INDEPENDENT_AMBULATORY_CARE_PROVIDER_SITE_OTHER): Admitting: Psychiatry

## 2024-06-13 ENCOUNTER — Encounter: Payer: Self-pay | Admitting: Psychiatry

## 2024-06-13 DIAGNOSIS — F339 Major depressive disorder, recurrent, unspecified: Secondary | ICD-10-CM

## 2024-06-13 DIAGNOSIS — R251 Tremor, unspecified: Secondary | ICD-10-CM

## 2024-06-13 DIAGNOSIS — F411 Generalized anxiety disorder: Secondary | ICD-10-CM | POA: Diagnosis not present

## 2024-06-13 DIAGNOSIS — F5105 Insomnia due to other mental disorder: Secondary | ICD-10-CM | POA: Diagnosis not present

## 2024-06-13 NOTE — Progress Notes (Signed)
 Andrea [name] [id number] Oct 31, 1950 73 y.o.   Subjective:   Patient ID:  Andrea [name] is a 73 y.o. (DOB April 08, 1951) female.  Chief Complaint:  Chief Complaint  Patient presents with   Follow-up   Depression   Anxiety    HPI Andrea [name] presents to the office today for follow-up of MDE and anxiety disorder.  seen 09/2019.  No meds were changed.  Been on Lexapro  20 since early 2017.  02/27/2020 phone call from patient: Pt experiencing more anxiety than she normally has. Pt would like to know what are some options that she has as far as her medication. Pt will be in an appt  MD response: If the anxiety is just occasional then using alprazolam  as needed would be an okay thing to do.  If it is been fairly persistent for a couple of weeks or more than the simplest solution would be to increase the Lexapro  to 1-1/2 tablets daily.  Historically she has done well on Lexapro  20 mg daily for an extended period of time.  If she is under temporary stress then once that is resolved we could drop it back to 1 daily.  If this does not work as expected then she should schedule an earlier appointment. Pt response: Patient called back and she's been having temporary stress, but it's not bad. Things are great, she's working out and feeling okay for the most part. It's been going on for a bit so she agrees to the increase Lexapro  20 mg 1.5 tablets daily. She has been taking alprazolam  at hs occasionally to help sleep better and it does help.   12/06/2020 appointment with the following noted:  No covid.  H Covid and recovered.   Had increased Lexapro  to 30 mg for 90 days and saw a difference but started seeing weight gain and was doing oK and able to drop back and been OK. Still renovating a house for a year.  Sold her house and mourns the house. Calmed down.  Kept 3 gkids and dogs 10 days.   Andrea [name] Hopes for new house May 1. Rare alprazolam  for HS. Stress moving to condo and would have crying spell over the move  and some problems she was running into dealing with it.  Hard with change.  But taking time to adjust to the idea of moving from her house of 23 years.  Initially refused but he left it up to her and she's come to peace about it.  Andrea [name].   Good response to medication and no SE.  Exercising is good medicine.  If doesn't then doesn't feel as good. Satisfied. Plan: no med changes  08/01/21  appt noted:  seen with [name] died 08/20/2024.  A lot of ups and downs per D.  Hard dips. Got worse when retired and dealing with the new  house.  Had trouble getting rid of things from the old house causing regrets over sellling the house.  D notes doesn't do well with instability  and stressors.  Ruminates on past mistakes. Blamed [name] for the  difficulty renovating the house.  Neg self talk and beats herself up. When not enough sleep will get depressed.  Does better if busy.   Dreads things.  Patient denies difficulty with sleep initiation or maintenance. Denies appetite disturbance.  Patient reports that energy and motivation have been good. Patient denies any difficulty with concentration.  Patient denies any suicidal ideation.  Plan: Abilify  2.5 mg  daily for a week, then 5 mg daily. Continue Lexapro  20 mg daily  08/31/2021 appt noted: It worked immediately.  Insomnia with EMA. Normally 9 hours.  It's like I'm high.  Not tired.  Average 3-4 hours. Not depressed and no crazy negative thoughts.  Good response.  Energetic but up in middle of night. Plan: Okay to stop Abilify  because of insomnia.   09/29/2021 appointment with the following noted: Parties were great.  Sleep problem resolved.  A lot of rest at the beach. Maybe the last week or 2 less motivation and socialization.  Not laying in the bed.  Just not as good as she was.  Not as outgoing as normal. No alcohol now.  Only had 1-2 at night.  Not ruminating.   Willing to restart Abilify  at lower dose 2 mg daily. 3 grandsons  Plan: Relapse  depression recurs she can resume Abilify   but lower dose 2 mg daily or every other day to try to avoid the insomnia where she can contact our office and we can discussed the possibility of an alternative antidepressant such as Trintellix  or duloxetine .  12/28/2021 appointment noted: Several phone calls since she was here.  Abilify  plus Lexapro  failed.  Switch to duloxetine  90 mg which she started 12/02/2018 2023 Started lamotrigine . Also started Rexulti  Sister has had cycling depression that has responded well to lamotrigine  with poor response to SSRIs [name] with a little dementia. SE tremor 90 mg duloxetine , ? Wt gain over a few weeks.. Exercise and wt control Social isolation, low self esteem, worry about the way she looks and not usually that way. Crying better with duloxetine .  More dependent on H.   Anhedonia.  Black hole.  Never this low. No SI Having to use Xanax .   Asks about day treatment and TMS Plan: rec TMS Continue lamotrigine  as prescribed Increase Rexulti  to 1 mg daily Switch to Trintellix :  reduce duloxetine  to 2 of the 30 mg capsules and start Trintellix  5 mg daily for 5 days,  Then Increase Trintellix  to 10 mg daily and reduce duloxetine  to 1 capsule for 1 week. Then stop duloxetine .  02/15/22 appt noted: On Trintellix  , Rexulti  1 and lamotrigine  I feel good.  Walks 3 miles daily is great for mental health. TMS would not be covered by [facility]. Started feeling better after a week or 2 after the last visit. SE appetite increased. No nausea. Not crying anymore. Sleeping well and no longer ruminates. Plan: Continue Trintellix  but DC Rexulti  due to weight gain.  Continue lamotrigine   03/06/2022 appointment with the following noted: Last 2 weeks has been very sad and nervous. Trintellix  was increased to 20 mg daily  03/10/2022 complaining of ongoing depression, feeling jittery, negative thinking, early morning awakening, ruminating about past decisions.  Wanting to consider TMS  because she is desperate for improvement. Plan we will schedule urgent appointment  03/15/22 urgent appt noted:  seen with Andrea [name] Very depressed, worst ever.  Don't want to be alone, ongoing worry, ruminating on past decisions.  Racing negative thoughts.  No motivation.  Trouble staying asleep.  Getting 4 hours and used to 9 hours. Dread getting up. Using Xanax  Increased Trintellix  to 20 mg 9 days ago. H agrees sleep is critical. Isolating.   Need to be better by July 11. Plan: Clonazepam  1 mg HS. Continue trintellix  20 mg daily she is only been on this dose 9 days and it needs more time. Re lamotrigine  and increase to 150 mg daily.Andrea [name]  Vraylar  1.5 mg every other day Option TMS  03/24/22 appt noted: So much better.  Thinking straighter and not negative and not sad.  Sleeping better 8-9 hours.. No crying. H notices progressively better every day.  Mind clarity is much better.   No SE noted.  No hangover.  No nausea. Found a therapist, Andrea [name].   Feels well enough to go to [place] and kind of excited.  Back July 16.   Plan: Clonazepam  1 mg HS. This resolved insomnia without SE Continue trintellix  20 mg daily she is only been on this dose 20 days and it needs more time. Re lamotriginecontinue 150 mg daily..    Vraylar  1.5 mg every other day Option TMS  04/12/22 appt noted: Great.  Went to [place].  Was herself and enjoyed it.  Anxiety was managed.   [facility] and liked her. 3# wt gain.   Patient reports stable mood and denies depressed or irritable moods.  Patient denies any recent difficulty with anxiety.  Patient denies difficulty with sleep initiation or maintenance. Denies appetite disturbance.  Patient reports that energy and motivation have been good.  Patient denies any difficulty with concentration.  Patient denies any suicidal ideation. Sleeping well than not anxious. Plan: Trintellix  20  07/10/22 TC depression returned recently.  08/15/22 appt  noted: Still blunted  and diminished motivation.  Some days better than others.  Going to gym.  Partially better.   Still has a lot of anxiety and doesn't want to be alone. Functioning but not normal.  Dreads parties.   SE tremor for a couple of week.sleep great.  Dep 7/10. Current Vraylar  1.5 mg daily, increase lamotrigine  200 mg daily, Trintellix  20 , clonazepam  0.5 mg HS Plan: Reduce Clonazepam  1/2  mg HS. This resolved insomnia without SE Continue trintellix  20 mg daily only mildly effective. lamotrigine  continue 200 mg daily..    DC Vraylar  DT lost response Auvelity  Stop Vraylar  After Thanksgiving stop Trintellix  Wait 3 days then Start Auvelity  1 in the morning for 1 week,  and if no side effects then increase Auvelity  to 1 in the AM and 1 in the PM  09/04/22 TC: Complaining of persistent depression and wanting to do something else to help.  Added lithium  CR 300 mg nightly  09/11/2022 phone call complaining of no improvement with Auvelity  and lithium .  Appointment was moved up.  09/13/22 appt noted: Nothing changed. No better and no worse.  Awakens sad and tearful.  Sleeps well. Pushing herself to the gym.  Has a trainer. On clonazepam  0.5 mg HS She remains anxious when alone for no apparent reason.  Feelings of inferiority.  She is normally extroverted but now she is wanting to isolate from people.  All tasks seem monumental.  No enjoyment and not looking forward to anything.  Everything is a Personal assistant.  She remains generally worried and anxious which is not typical for her. Plan: Clonazepam  1/2  mg HS. This resolved insomnia without SE Stop Auvelity  Start nortriptyline  1 of the 25 mg capsules at night for 4 nights then 2 at night for 4 nights then 3 at night, then wait 1 week and get the blood test in the morning at [facility] Reduce lamotrigine  to 1 and 1/2 tablets daily  Lamotrigine  has not been helpful to him 100 mg daily.  Start reducing lamotrigine  150 mg daily and will continue to  taper at next appointment.  09/27/21 urgent appt :  she wanted urgent appt bc desperate to  feel better.  Seen with H Called at least twice since here for the same reasons of depression. Not sleeping well even with clonazepam  1 mg HS. Doesn't want to be alone. Shakey.   On lithium  300 mg daily, nortriptyline  75 mg HS.   Doesn't want to live like this but not acutely suicidal. Tolerating meds. Plan: pursue [facility] protocol TMS at [facility] bc faster optin than alternatives  10/30/22 TC: finished 50 TMS tx in the week and wants appt ASAP bc still struggling with crying and depresssion..  11/03/22 urgent appt : Completed TMS last week. Tue-Thur better days and then Friday nose-dived. Not done well since got back.   H notes high anxiety and crying in AM and PM and doesn't want to be alone.  Xanxax seems to help. Xanax  helps and taking 0.5 mg AM and 1 mg HS H notes memory is sharper and she's more alert after treatment. Very anxioius all the time.  Get up sad.  Exercising. Sleep good with Xanax  1 mg HS. Plan: Increase Lexapro  20 mg dailly (should help crying and maybe anxiety but unlikely to resolve depression) Increase alprazolam  to 0.5 mg TID and 1 mg HS for severe anxiety. H notes it helps. Pramipexole  0.25 BID for 5 days and if NR then 0.5 mg Bid off label. Reduce lamotrigine  100 mg for 2 weeks then 50 mg daily for 2 weeks then stop it. She wants to pursue Spravato  if pramipexole  fails  11/17/22 emergency work in appt: Pramipexole  was not sent in and MD not aware until yesterday.  She is not any better and is desperate for a med change. Sleeping well and taking Xanax  1 mg HS Mornings are worse.  Not crying much.  Last Friday was a bad day and she didn't want to be alone.  But did let her H play golf once this week. Reduced lamotrigine  today to 50 BID and weaning off. H says better this week than last week. Increased Lexapro  to 20 mg daily. Plan: Increased Lexapro  20 mg dailly (helped crying  and maybe anxiety but unlikely to resolve depression) Continue alprazolam  to 0.5 mg TID prn anxiety and 1 mg HS. H notes it helps. Pramipexole  0.25 mg BID for 3 days then 0.5 mg BID Reduce lamotrigine  50 mg daily for 2 weeks then stop it. She wants to pursue Spravato  if pramipexole  fails  12/25/22 appt urgently. COMPLETED 1 WEEK TMS [facility] without benefit except TMS took away brain fog.   She feels need to take Xanax  0.5 mg TID  Some sleepiness with Xanax  . Mornings are better and fine while at the gym but when home gets depressed again.   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 Needs Xanax  to do social things. Plan: Increased Lexapro  20 mg dailly (helped crying and maybe anxiety but unlikely to resolve depression) Continue alprazolam  to 0.5 mg TID prn anxiety and 1 mg HS. H notes it helps. Change pramipexole  0.5mg  tablets, 1 tablet four times daily for 1 week,  Then if not better 1and 1/2 tablets in the AM and dinner and 1 tablet at lunch and dinner.    4/9-4/15 hosp for ruptured appendix and missed meds: Can restart Lexapro  at 1/2 tablet daily for 3 days then 1 daily. Restart 1/2 pramipexole  tablet 3 times daily for 3 days then 1 tablet 3 times daily for 3 days then 1 and 1/2 tablet 3 times daily.      01/12/23 appt noted: No GI sx now.  No appetite but  working on protein.   I think I'm doing real well mentally.  Has resumed meds.  Mood has been great. Pramipexole  0.5mg   1 and 1/2 TID Lexapro  20 Xanax  reduced to 1 mg HS H sees a difference.   No SE with meds.   No med changes  01/25/23 appt noted: Meds:  pramipexole  0.75 mg TID, Xanax  1.5 mg HS and 0.5 mg prn, Lexapro  20 mg daily. Doing great.  Staples removed and healing from surgery appendectomy. Back in the gym helps her.   Satisfied with meds. Getting out socially and feeling like her old self.  Andrea [name] went to ER vomiting blood.   Plan: no med changes  02/07/23 appt noted: Back from [facility] yesterday.  Had ear infection.   Then conjunctivitis.  Eyes hurt.   Before the surgery was getting better and feels like she is sliding back some.  Gradually a little worse since here.  Wonders if she could get better with joy and social drive.   Off Abx .  Sleep is good with meds.  With depression then gets anxious too.  Is functioning.   Meds:  pramipexole  0.75 mg TID, Xanax  1.5 mg HS and 0.5 mg prn, Lexapro  20 mg daily. Consistent and no SE. Plan: Continue Lexapro  20 mg dailly (helped crying and maybe anxiety but unlikely to resolve depression) Continue alprazolam   1 mg HS. H notes it helps. Increase pramipexole  0.5mg  tablets, 2 tablets 3 times daily  01/31/23 TC:Con Andrea [name], CMA  to Me     02/20/23  4:56 PM Note Patient reporting she is not feeling as good as she thinks she should. Her anxiety is the biggest concern. She said she can go to the gym in the mornings and does ok, but she had a closing to go to recently and had to take 1/4 of a Xanax , reports it took the edge off. She reports daily tremors that are helped by Xanax . She is not crying, she is getting things done, sleeping okay. Will have her grandchildren next week and she isn't excited about it like she should be.    Taking 6 mirapex , 1.5 Xanax  and Lexapro  20 mg.     MD resp:  CC   02/20/23  5:48 PM Note We just increased the pramipexole  recently and I see her next week.  I do not want to increase the medicine any further until I see her again.      03/02/23 urgent appt noted: Some improvement with incr pramipexole  1 mg TID. Before felt worse in AM now feels better in AM and goes to gym. Otherwise no excitement.  Hard to make decisions. Less social and smiling.  Subdued more than normal.  Not confident.  Not enjoying present and ruminates in past.   No SE.  No impulsivity Has tremor for awhile .  Is mild for a month. Hard to conc.  Brain fog was better with TMS but is back. Continue Lexapro  20 mg dailly (helped crying and maybe anxiety but unlikely  to resolve depression) Continue alprazolam   1 mg HS. H notes it helps. Increase pramipexole  1 mg QID times daily  03/16/23 appt noted: Meds as above SE: If not enough food makes her excitable, urgent, talking fast.  Does it a bit too much.  Energy fine.   Feels better in the AM at the gym and when home a dark shadow comes over her.   Still feels dep overall.  Went to work with friends and  didn't feel natural and didn't enjoy it like she should.  Pushes herself to stay active.   Takes 1.5 mg Xanax  HS and then awakens and takes another 0.5 mg HS but gets enough sleep. A lot of brain fog.  Feels inferior and doesn't usually feel that way. Infrequent crying spells. Plan:  Continue Lexapro  20 mg dailly (helped crying and maybe anxiety but unlikely to resolve depression) Continue alprazolam   1 mg HS. H notes it helps. Reduce pramipexole  1 mg TID for 3 days, then 0.5 mg QID for 3 days, then 0.5 mg BID for 3 days, then 0.25 mg BID for 3 days, then stop it. Now start Latuda  (lurasidone ) 40 mg 1/2 tablet for 1 week.  If not benefit then 1 tablet daily with 350 calories  04/13/23 urgent appt note: with [facility] 04/05/23 for dep with SI Worst I've ever been.  Feels out of control.  Shaking .  Can't sleep.  Feels disconnected.   Hated being hosp bc locked up.   Taking quetiapine  100 hS and lorazepam and still can't sleep. Started sertraline  50 and stopped Lexapro . Quetiapine  100 mg HS Plan: Stop hydroxyzine Increase quetiapine  200 then 300 mg at night for sleep and depression  Stop lorazepam and return to alprazolam  1 mg at night for sleep Increase sertraline  to 100 mg daily or 2 of 50 mg nightly  04/23/23 appt noted: Here for first appt with Spravato .  Highly anxious about getting Spravato  today.  Doesn't like feeling out of control and worries over this. Still very depressed.  Doesn't think her sx as noted are due to depression.  Thinks she has a neuro problem.  But she has no other neuro sx like  numbness, weakness, balance px, or anything other than dep and anxiety sx. Received Spravato  56 mg today.  It was horrible! Bc she felt out of control and was anxious receiving it.  Thought it would last forever re: dissociation.  It was scary to her.  Couldn't relax for it.  No N, V, HA.  Ambivalent about continuing Spravato  but open. No specific concerns with meds.    04/25/23 appt noted: Needed Xanax  before visit today to overcome fear of coming to the appt. She was more disciplined about controlling neg thought during Spravato  admin and had a much better experience.  It was not scary nor associated with sig fear.  She is still dep and anxious without change otherwise since seen a couple of days ago. Tolerating med changes from last week.   Received Spravato  56 mg again and tolerated it without adverse SE.  Typical SE dissociation, no NV, palpitations, HA.  Motivated to continue the Spravato  and agrees to increase it.  Better experience than the first time. Plan: Increase quetiapine  to 400 mg HS  04/26/23 TC:  Patient taking 300 mg of Seroquel  and 1 mg of Xanax  for sleep. She can get to sleep, just can't stay asleep. Doesn't say how long she is sleeping, but says she needs 9 hours of sleep. Reporting social isolation is bad, she won't leave the house, but doesn't like being home alone.  Reports she has never been this sick mentally and feels like she may need to go to the hospital. No SI, just doesn't like being alone.     MD resp:  Pt just seen yesterday and received 2nd Spravato  56 mg .  That was positive experience. However she is still severely depressed and ruminating and reassurance seeking..  She doesn't need to  go to the hospital. She told me yesterday she slept 7 hours but needs 9 hours.  7 hours is not bad.   She is taking quetiapne 300 mg HS with Xanax  1 mg HS.  No increase in Xanax  but can increase quetiapine  to 400 mg HS (max dose 800) and this could help sleep and anxiety and  depression. She should keep appt next week for next Spravato .  In case she asks, Spravato  is not making her worse in any way.     05/01/23 appt noted;H present also Current meds; quetiapine  400 mg HS, sertraline  100 mg daily, alprazolam  0.5 mg TID and 1 mg HS. Tolerating med changes.    Received Spravato  84 mg first time.  Typical SE dissociation, no NV, palpitations, HA.  However she had a bad experience DT severe anxiety before starting the Spravato .   Very fearful, scared.  Without specific reasons.  Severe anxiety dep, hopeless.  No SI but doesn't want to live like this.  So anxious hard to sleep and some crying spells. Seroquel  not helping sleep.  05/03/23 appt noted: Current meds; quetiapine  400 mg HS, sertraline  100 mg daily, alprazolam  0.5 mg TID and 1 mg HS. Tolerating med changes.    Received Spravato  84 mg.  Typical SE dissociation, no NV, palpitations, HA.  However she had a bad experience DT severe anxiety before starting the Spravato .   Highly anxious, fearful of everything including Spravato  admin.  Afraid she will forget who she is despite having it before.  Still anxious and trouble sleeping at home.  Sleep no better with quetiapine  and still ruminatiing.   Plan increase quetiapine  to 500 mg HS  05/08/23 appt noted: Meds: quetiapine  500 mg HS, sertraline  100 mg daily, alprazolam  0.5 mg TID and 1 mg HS. Received Spravato  84 mg 3rd time.  Typical SE dissociation, no NV, palpitations, HA.  However she had a bad experience DT severe anxiety before starting the Spravato .   She is not better so far.  Ongoing severe depression anxiety, avoidance, anhedonia, rumination, afraid to be alone. All not typical of herself.   No SE with meds except constipation and sweating at night.  Plan:  Increase quetiapine  to 600 mg at night for sleep and depression & rumination and next week to 600 HS increased alprazolam  1 mg at night for sleep and 0.5 mg TID Increase sertraline  150 mg daily   Spravato  84 mg AM twice weekly for severe TRD  05/10/23 appt noted:  Meds: quetiapine  600 mg HS, sertraline  150 mg daily, alprazolam  0.5 mg TID and 1 mg HS. Received Spravato  84 mg 4th time.  Typical SE dissociation, no NV, palpitations, HA.  However she had a bad experience DT severe anxiety before starting the Spravato .   She is not better so far.  Ongoing severe depression anxiety, avoidance, anhedonia, rumination, afraid to be alone. All not typical of herself.   SE constipation she finds unmanageable  Did sleep better with increased quetiapine  600 mg Hs but cannot continue dT constipation.  However dep and anxiety are no better with Spravato  or the other treatments except better sleep.  Anxiety ongoing severe with rumination.  05/17/23 appt noted:  Meds: switched to olanzapine  15 mg HS, sertraline  150, alprazolam   Tremor seems worse but H disagrees. Mood continually worse.  She is dependent on H and afraid to be alone.  No sweating. Xanax  helped but wears off after a couple of hours.  She is open to antoher option Tolerating med  changes. No improvement except slept a little better last night but still not enough. No Spravato  today bc 2 weeks of it didn't help.  Agrees to pursue ECT but hoping med changes will help Crying spells.  07/10/23 appt noted: Great.  Andrea [name] back at work.  Playing golf again.   Med: sertraline  200, propranolol  10 BID, olanzapine  15 pm, clonazepam  1 mg HS Did not require ECT.  All of a sudden something clicked and gradually better .  Doesn't even recognize the person she was with depression.   No negative thoughts.  Laughing again.  More social and talking again.  Happy in the am.  Sleep very well 10-11 hours.  Everything back to normal .  Doesn't need Andrea [name] with her now.  Ok being alone.  Anxiety gone also. SE carb craving and wt gain 10#.   Plan: Propranolol  10 mg tablet 2-3 tablets twice daily as needed for tremor Continue clonazepam  1 mg tablet 1/2 twice  daily and 1 tablet at night Continue sertraline  200 tablets daily. Switch olanzapine  15 to Lybalvi 10 DT wt gain.    08/10/23 appt noted: Psych med: sertraline  200, propranolol  10 BID, Lybalvi 10 pm, clonazepam  1 mg HS Up to 141# with Lybalvi.  Went to wt reducing clinic and got shot semaglutide.  $75/ week.  That took appetite away.   Was a little more down after a couple of missed doses of Lybalvi. Mood had been great  until the missed dose.  Sleeping a lot.  No problems with change from olanzapine  15 to Lybalvi 10-10 daily but no benefit with appetite reduction.   Party tomorrow night.  Got house decorated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

## 2024-06-13 NOTE — Patient Instructions (Signed)
 Stop Celebrex  Reduce sertraline  to 1 tablet daily.   Reduce quetiapine  to 1 and 1/2 tablets at night for 1 week then reduce to 1 tablets.  3-7 days after surgery reduce sertraline  again to 1/2 tablet for 7 days then stop it.   Wait 10 days and call to begin the Parnate.

## 2024-06-18 ENCOUNTER — Encounter (HOSPITAL_COMMUNITY): Payer: Self-pay

## 2024-06-18 NOTE — Pre-Procedure Instructions (Signed)
 Surgical Instructions   Your procedure is scheduled on June 23, 2024. Report to [facility] Main Entrance A at 10:15 A.M., then check in with the Admitting office. Any questions or running late day of surgery: call [phone]  Questions prior to your surgery date: call [phone], Monday-Friday, 8am-4pm. If you experience any cold or flu symptoms such as cough, fever, chills, shortness of breath, etc. between now and your scheduled surgery, please notify us  at the above number.     Remember:  Do not eat after midnight the night before your surgery   You may drink clear liquids until 9:15 AM the morning of your surgery.   Clear liquids allowed are: Water, Non-Citrus Juices (without pulp), Carbonated Beverages, Clear Tea (no milk, honey, etc.), Black Coffee Only (NO MILK, CREAM OR POWDERED CREAMER of any kind), and Gatorade.    Take these medicines the morning of surgery with A SIP OF WATER: atorvastatin  (LIPITOR)  levothyroxine  (SYNTHROID )  minoxidil (LONITEN)  propranolol  (INDERAL )  raloxifene  (EVISTA )    May take these medicines IF NEEDED: ALPRAZolam  (XANAX )  fluticasone  (FLONASE ) nasal spray    One week prior to surgery, STOP taking any Aspirin (unless otherwise instructed by your surgeon) Aleve, Naproxen, Ibuprofen, Motrin, Advil, Goody's, BC's, all herbal medications, fish oil, and non-prescription vitamins.                     Do NOT Smoke (Tobacco/Vaping) for 24 hours prior to your procedure.  If you use a CPAP at night, you may bring your mask/headgear for your overnight stay.   You will be asked to remove any contacts, glasses, piercing's, hearing aid's, dentures/partials prior to surgery. Please bring cases for these items if needed.    Patients discharged the day of surgery will not be allowed to drive home, and someone needs to stay with them for 24 hours.  SURGICAL WAITING ROOM VISITATION Patients may have no more than 2 support people in the waiting  area - these visitors may rotate.   Pre-op nurse will coordinate an appropriate time for 1 ADULT support person, who may not rotate, to accompany patient in pre-op.  Children under the age of 31 must have an adult with them who is not the patient and must remain in the main waiting area with an adult.  If the patient needs to stay at the hospital during part of their recovery, the visitor guidelines for inpatient rooms apply.  Please refer to the [facility] website for the visitor guidelines for any additional information.   If you received a COVID test during your pre-op visit  it is requested that you wear a mask when out in public, stay away from anyone that may not be feeling well and notify your surgeon if you develop symptoms. If you have been in contact with anyone that has tested positive in the last 10 days please notify you surgeon.      Pre-operative CHG Bathing Instructions   You can play a key role in reducing the risk of infection after surgery. Your skin needs to be as free of germs as possible. You can reduce the number of germs on your skin by washing with CHG (chlorhexidine gluconate) soap before surgery. CHG is an antiseptic soap that kills germs and continues to kill germs even after washing.   DO NOT use if you have an allergy to chlorhexidine/CHG or antibacterial soaps. If your skin becomes reddened or irritated, stop using the CHG and notify one  of our RNs at [phone].              TAKE A SHOWER THE NIGHT BEFORE SURGERY AND THE DAY OF SURGERY    Please keep in mind the following:  DO NOT shave, including legs and underarms, 48 hours prior to surgery.   You may shave your face before/day of surgery.  Place clean sheets on your bed the night before surgery Use a clean washcloth (not used since being washed) for each shower. DO NOT sleep with pet's night before surgery.  CHG Shower Instructions:  Wash your face and private area with normal soap. If you choose to  wash your hair, wash first with your normal shampoo.  After you use shampoo/soap, rinse your hair and body thoroughly to remove shampoo/soap residue.  Turn the water OFF and apply half the bottle of CHG soap to a CLEAN washcloth.  Apply CHG soap ONLY FROM YOUR NECK DOWN TO YOUR TOES (washing for 3-5 minutes)  DO NOT use CHG soap on face, private areas, open wounds, or sores.  Pay special attention to the area where your surgery is being performed.  If you are having back surgery, having someone wash your back for you may be helpful. Wait 2 minutes after CHG soap is applied, then you may rinse off the CHG soap.  Pat dry with a clean towel  Put on clean pajamas    Additional instructions for the day of surgery: DO NOT APPLY any lotions, deodorants, cologne, or perfumes.   Do not wear jewelry or makeup Do not wear nail polish, gel polish, artificial nails, or any other type of covering on natural nails (fingers and toes) Do not bring valuables to the hospital. [facility] is not responsible for valuables/personal belongings. Put on clean/comfortable clothes.  Please brush your teeth.  Ask your nurse before applying any prescription medications to the skin.

## 2024-06-19 ENCOUNTER — Encounter (HOSPITAL_COMMUNITY): Payer: Self-pay

## 2024-06-19 ENCOUNTER — Other Ambulatory Visit: Payer: Self-pay

## 2024-06-19 ENCOUNTER — Encounter (HOSPITAL_COMMUNITY)
Admission: RE | Admit: 2024-06-19 | Discharge: 2024-06-19 | Disposition: A | Discharge status: Home / Self Care | Source: Ambulatory Visit | Attending: General Surgery | Admitting: General Surgery

## 2024-06-19 VITALS — BP 133/83 | HR 81 | Temp 98.2°F | Resp 16 | Ht 62.0 in | Wt 145.9 lb

## 2024-06-19 DIAGNOSIS — Z01818 Encounter for other preprocedural examination: Secondary | ICD-10-CM | POA: Diagnosis not present

## 2024-06-19 HISTORY — DX: Depression, unspecified: F32.A

## 2024-06-19 HISTORY — DX: Essential (primary) hypertension: I10

## 2024-06-19 HISTORY — DX: Nausea with vomiting, unspecified: R11.2

## 2024-06-19 HISTORY — DX: Insomnia, unspecified: G47.00

## 2024-06-19 HISTORY — DX: Attention-deficit hyperactivity disorder, unspecified type: F90.9

## 2024-06-19 HISTORY — DX: Other seasonal allergic rhinitis: J30.2

## 2024-06-19 HISTORY — DX: Prediabetes: R73.03

## 2024-06-19 HISTORY — DX: Unspecified hemorrhoids: K64.9

## 2024-06-19 HISTORY — DX: Headache, unspecified: R51.9

## 2024-06-19 LAB — BASIC METABOLIC PANEL WITH GFR
Anion gap: 9 (ref 5–15)
BUN: 15 mg/dL (ref 8–23)
CO2: 28 mmol/L (ref 22–32)
Calcium: 9 mg/dL (ref 8.9–10.3)
Chloride: 95 mmol/L — ABNORMAL LOW (ref 98–111)
Creatinine, Ser: 0.67 mg/dL (ref 0.44–1.00)
GFR, Estimated: >60 mL/min (ref >60)
Glucose, Bld: 92 mg/dL (ref 70–99)
Potassium: 4.1 mmol/L (ref 3.5–5.1)
Sodium: 132 mmol/L — ABNORMAL LOW (ref 135–145)

## 2024-06-19 LAB — CBC
HCT: 36.2 % (ref 36.0–46.0)
Hemoglobin: 12.4 g/dL (ref 12.0–15.0)
MCH: 33 pg (ref 26.0–34.0)
MCHC: 34.3 g/dL (ref 30.0–36.0)
MCV: 96.3 fL (ref 80.0–100.0)
Platelets: 297 K/uL (ref 150–400)
RBC: 3.76 MIL/uL — ABNORMAL LOW (ref 3.87–5.11)
RDW: 12.1 % (ref 11.5–15.5)
WBC: 7.7 K/uL (ref 4.0–10.5)
nRBC: 0 % (ref 0.0–0.2)

## 2024-06-19 NOTE — Progress Notes (Signed)
 PCP - Comer Gaskins, NP Cardiologist - none Behavioral Health - Dr Lorene Macintosh  Chest x-ray - n/a EKG - 06/19/24 Stress Test - n/a ECHO - n/a Cardiac Cath - n/a  ICD Pacemaker/Loop - n/a  Sleep Study -  n/a  Diabetes - n/a  Aspirin & Blood Thinner Instructions:  n/a  ERAS - clear liquids til 9:15 AM DOS.  Anesthesia review: no  STOP now taking any Aspirin (unless otherwise instructed by your surgeon), Aleve, Naproxen, Ibuprofen, Motrin, Celebrex , Advil, Goody's, BC's, all herbal medications, fish oil, and all vitamins.   Coronavirus Screening Do you have any of the following symptoms:  Cough yes/no: No Fever (>100.38F)  yes/no: No Runny nose yes/no: No Sore throat yes/no: No Difficulty breathing/shortness of breath  yes/no: No  Have you traveled in the last 14 days and where? yes/no: No  Patient verbalized understanding of instructions that were given to them at the PAT appointment. Patient was also instructed that they will need to review over the PAT instructions again at home before surgery.

## 2024-06-23 ENCOUNTER — Other Ambulatory Visit: Payer: Self-pay

## 2024-06-23 ENCOUNTER — Encounter (HOSPITAL_COMMUNITY)
Admission: RE | Disposition: A | Discharge status: Home / Self Care | Payer: Self-pay | Source: Home / Self Care | Attending: General Surgery

## 2024-06-23 ENCOUNTER — Ambulatory Visit (HOSPITAL_COMMUNITY): Payer: Self-pay | Admitting: Anesthesiology

## 2024-06-23 ENCOUNTER — Encounter (HOSPITAL_COMMUNITY): Payer: Self-pay | Admitting: General Surgery

## 2024-06-23 ENCOUNTER — Inpatient Hospital Stay (HOSPITAL_COMMUNITY)
Admission: RE | Admit: 2024-06-23 | Discharge: 2024-06-25 | DRG: 354 | Disposition: A | Discharge status: Home / Self Care | Attending: General Surgery | Admitting: General Surgery

## 2024-06-23 DIAGNOSIS — E039 Hypothyroidism, unspecified: Secondary | ICD-10-CM | POA: Diagnosis not present

## 2024-06-23 DIAGNOSIS — Z79890 Hormone replacement therapy (postmenopausal): Secondary | ICD-10-CM | POA: Diagnosis not present

## 2024-06-23 DIAGNOSIS — Z90710 Acquired absence of both cervix and uterus: Secondary | ICD-10-CM | POA: Diagnosis not present

## 2024-06-23 DIAGNOSIS — K439 Ventral hernia without obstruction or gangrene: Principal | ICD-10-CM | POA: Diagnosis present

## 2024-06-23 DIAGNOSIS — G8918 Other acute postprocedural pain: Secondary | ICD-10-CM | POA: Diagnosis present

## 2024-06-23 DIAGNOSIS — F32A Depression, unspecified: Secondary | ICD-10-CM | POA: Diagnosis present

## 2024-06-23 DIAGNOSIS — Z9882 Breast implant status: Secondary | ICD-10-CM

## 2024-06-23 DIAGNOSIS — K432 Incisional hernia without obstruction or gangrene: Secondary | ICD-10-CM | POA: Diagnosis present

## 2024-06-23 DIAGNOSIS — Z79899 Other long term (current) drug therapy: Secondary | ICD-10-CM | POA: Diagnosis not present

## 2024-06-23 DIAGNOSIS — K567 Ileus, unspecified: Secondary | ICD-10-CM | POA: Diagnosis present

## 2024-06-23 DIAGNOSIS — F419 Anxiety disorder, unspecified: Secondary | ICD-10-CM | POA: Diagnosis not present

## 2024-06-23 DIAGNOSIS — K9189 Other postprocedural complications and disorders of digestive system: Secondary | ICD-10-CM | POA: Diagnosis present

## 2024-06-23 DIAGNOSIS — E7800 Pure hypercholesterolemia, unspecified: Secondary | ICD-10-CM | POA: Diagnosis not present

## 2024-06-23 HISTORY — PX: VENTRAL HERNIA REPAIR: SHX424

## 2024-06-23 SURGERY — REPAIR, HERNIA, VENTRAL, LAPAROSCOPIC
Anesthesia: General | Site: Abdomen

## 2024-06-23 MED ORDER — ONDANSETRON HCL 4 MG/2ML IJ SOLN
4.0000 mg | Freq: Four times a day (QID) | INTRAMUSCULAR | Status: DC | PRN
  Administered 2024-06-23 – 2024-06-24 (×3): 4 mg via INTRAVENOUS
  Filled 2024-06-23 (×3): qty 2

## 2024-06-23 MED ORDER — SODIUM CHLORIDE 0.9 % IV SOLN: INTRAVENOUS | Status: AC

## 2024-06-23 MED ORDER — FENTANYL CITRATE (PF) 100 MCG/2ML IJ SOLN
25.0000 ug | INTRAMUSCULAR | Status: DC | PRN
  Administered 2024-06-23: 50 ug via INTRAVENOUS
  Administered 2024-06-23: 25 ug via INTRAVENOUS
  Administered 2024-06-23 (×2): 50 ug via INTRAVENOUS

## 2024-06-23 MED ORDER — ACETAMINOPHEN 500 MG PO TABS
1000.0000 mg | ORAL_TABLET | Freq: Once | ORAL | Status: AC
  Administered 2024-06-23: 1000 mg via ORAL

## 2024-06-23 MED ORDER — SCOPOLAMINE 1 MG/3DAYS TD PT72
MEDICATED_PATCH | TRANSDERMAL | Status: DC | PRN
  Administered 2024-06-23: 1 via TRANSDERMAL

## 2024-06-23 MED ORDER — ACETAMINOPHEN 500 MG PO TABS
ORAL_TABLET | ORAL | Status: AC
  Filled 2024-06-23: qty 2

## 2024-06-23 MED ORDER — CHLORHEXIDINE GLUCONATE 0.12 % MT SOLN
OROMUCOSAL | Status: AC
  Administered 2024-06-23: 15 mL via OROMUCOSAL
  Filled 2024-06-23: qty 15

## 2024-06-23 MED ORDER — OXYCODONE HCL 5 MG PO TABS
5.0000 mg | ORAL_TABLET | ORAL | Status: DC | PRN
  Administered 2024-06-23: 5 mg via ORAL

## 2024-06-23 MED ORDER — OXYCODONE HCL 5 MG PO TABS
ORAL_TABLET | ORAL | Status: AC
  Filled 2024-06-23: qty 1

## 2024-06-23 MED ORDER — PROPOFOL 500 MG/50ML IV EMUL
INTRAVENOUS | Status: DC | PRN
  Administered 2024-06-23: 150 ug/kg/min via INTRAVENOUS
  Administered 2024-06-23: 175 ug/kg/min via INTRAVENOUS

## 2024-06-23 MED ORDER — ONDANSETRON HCL 4 MG/2ML IJ SOLN: 4.0000 mg | Freq: Once | INTRAMUSCULAR | Status: DC | PRN

## 2024-06-23 MED ORDER — LACTATED RINGERS IV SOLN
INTRAVENOUS | Status: DC
Start: 2024-06-23 — End: 2024-06-23

## 2024-06-23 MED ORDER — LIDOCAINE 2% (20 MG/ML) 5 ML SYRINGE
INTRAMUSCULAR | Status: DC | PRN
  Administered 2024-06-23: 60 mg via INTRAVENOUS

## 2024-06-23 MED ORDER — ALPRAZOLAM 0.5 MG PO TABS
0.5000 mg | ORAL_TABLET | Freq: Two times a day (BID) | ORAL | Status: DC | PRN
  Administered 2024-06-23 – 2024-06-24 (×2): 0.5 mg via ORAL
  Filled 2024-06-23 (×3): qty 1

## 2024-06-23 MED ORDER — PROPOFOL 10 MG/ML IV BOLUS
INTRAVENOUS | Status: AC
  Filled 2024-06-23: qty 20

## 2024-06-23 MED ORDER — FENTANYL CITRATE (PF) 250 MCG/5ML IJ SOLN
INTRAMUSCULAR | Status: AC
  Filled 2024-06-23: qty 5

## 2024-06-23 MED ORDER — METHOCARBAMOL 500 MG PO TABS
500.0000 mg | ORAL_TABLET | Freq: Four times a day (QID) | ORAL | Status: DC | PRN
  Administered 2024-06-23 – 2024-06-24 (×3): 500 mg via ORAL
  Filled 2024-06-23 (×2): qty 1

## 2024-06-23 MED ORDER — PROPOFOL 10 MG/ML IV BOLUS
INTRAVENOUS | Status: DC | PRN
  Administered 2024-06-23: 20 mg via INTRAVENOUS
  Administered 2024-06-23: 30 mg via INTRAVENOUS
  Administered 2024-06-23: 120 mg via INTRAVENOUS

## 2024-06-23 MED ORDER — DEXAMETHASONE SODIUM PHOSPHATE 10 MG/ML IJ SOLN
INTRAMUSCULAR | Status: DC | PRN
  Administered 2024-06-23: 10 mg via INTRAVENOUS

## 2024-06-23 MED ORDER — PANTOPRAZOLE SODIUM 40 MG IV SOLR
40.0000 mg | Freq: Every day | INTRAVENOUS | Status: DC
Start: 2024-06-23 — End: 2024-06-25
  Administered 2024-06-23 – 2024-06-24 (×2): 40 mg via INTRAVENOUS
  Filled 2024-06-23 (×2): qty 10

## 2024-06-23 MED ORDER — CHLORHEXIDINE GLUCONATE 0.12 % MT SOLN: 15.0000 mL | Freq: Once | OROMUCOSAL | Status: AC

## 2024-06-23 MED ORDER — BUPIVACAINE-EPINEPHRINE 0.25% -1:200000 IJ SOLN
INTRAMUSCULAR | Status: DC | PRN
  Administered 2024-06-23: 10 mL

## 2024-06-23 MED ORDER — FENTANYL CITRATE (PF) 100 MCG/2ML IJ SOLN
INTRAMUSCULAR | Status: AC
  Filled 2024-06-23: qty 2

## 2024-06-23 MED ORDER — PHENYLEPHRINE HCL-NACL 20-0.9 MG/250ML-% IV SOLN
INTRAVENOUS | Status: DC | PRN
  Administered 2024-06-23: 60 ug/min via INTRAVENOUS

## 2024-06-23 MED ORDER — SODIUM CHLORIDE 0.9 % IR SOLN
Status: DC | PRN
  Administered 2024-06-23: 1000 mL

## 2024-06-23 MED ORDER — SERTRALINE HCL 50 MG PO TABS
150.0000 mg | ORAL_TABLET | Freq: Every day | ORAL | Status: DC
  Administered 2024-06-24 – 2024-06-25 (×2): 150 mg via ORAL
  Filled 2024-06-23: qty 2
  Filled 2024-06-23: qty 1
  Filled 2024-06-23: qty 2
  Filled 2024-06-23: qty 1

## 2024-06-23 MED ORDER — CHLORHEXIDINE GLUCONATE CLOTH 2 % EX PADS: 6.0000 | MEDICATED_PAD | Freq: Once | CUTANEOUS | Status: DC

## 2024-06-23 MED ORDER — ONDANSETRON 4 MG PO TBDP: 4.0000 mg | ORAL_TABLET | Freq: Four times a day (QID) | ORAL | Status: DC | PRN

## 2024-06-23 MED ORDER — KETOROLAC TROMETHAMINE 30 MG/ML IJ SOLN
INTRAMUSCULAR | Status: AC
  Filled 2024-06-23: qty 2

## 2024-06-23 MED ORDER — ONDANSETRON HCL 4 MG/2ML IJ SOLN
INTRAMUSCULAR | Status: AC
  Filled 2024-06-23: qty 4

## 2024-06-23 MED ORDER — BUPIVACAINE-EPINEPHRINE (PF) 0.25% -1:200000 IJ SOLN
INTRAMUSCULAR | Status: AC
  Filled 2024-06-23: qty 30

## 2024-06-23 MED ORDER — SCOPOLAMINE 1 MG/3DAYS TD PT72
MEDICATED_PATCH | TRANSDERMAL | Status: AC
  Filled 2024-06-23: qty 1

## 2024-06-23 MED ORDER — RALOXIFENE HCL 60 MG PO TABS
60.0000 mg | ORAL_TABLET | Freq: Every day | ORAL | Status: DC
  Administered 2024-06-24 – 2024-06-25 (×2): 60 mg via ORAL
  Filled 2024-06-23 (×3): qty 1

## 2024-06-23 MED ORDER — FENTANYL CITRATE PF 50 MCG/ML IJ SOSY
12.5000 ug | PREFILLED_SYRINGE | INTRAMUSCULAR | Status: DC | PRN
  Administered 2024-06-23 – 2024-06-24 (×2): 25 ug via INTRAVENOUS
  Filled 2024-06-23 (×2): qty 1

## 2024-06-23 MED ORDER — GABAPENTIN 100 MG PO CAPS
100.0000 mg | ORAL_CAPSULE | ORAL | Status: AC
  Administered 2024-06-23: 100 mg via ORAL
  Filled 2024-06-23: qty 1

## 2024-06-23 MED ORDER — LEVOTHYROXINE SODIUM 75 MCG PO TABS
75.0000 ug | ORAL_TABLET | Freq: Every day | ORAL | Status: DC
  Administered 2024-06-24 – 2024-06-25 (×2): 75 ug via ORAL
  Filled 2024-06-23 (×2): qty 1

## 2024-06-23 MED ORDER — HYDRALAZINE HCL 20 MG/ML IJ SOLN
INTRAMUSCULAR | Status: AC
Start: 2024-06-23 — End: 2024-06-23
  Filled 2024-06-23: qty 1

## 2024-06-23 MED ORDER — ORAL CARE MOUTH RINSE: 15.0000 mL | Freq: Once | OROMUCOSAL | Status: AC

## 2024-06-23 MED ORDER — EPHEDRINE 5 MG/ML INJ
INTRAVENOUS | Status: AC
  Filled 2024-06-23: qty 5

## 2024-06-23 MED ORDER — METHOCARBAMOL 500 MG PO TABS
ORAL_TABLET | ORAL | Status: AC
Start: 2024-06-23 — End: 2024-06-23
  Filled 2024-06-23: qty 1

## 2024-06-23 MED ORDER — FENTANYL CITRATE (PF) 250 MCG/5ML IJ SOLN
INTRAMUSCULAR | Status: DC | PRN
  Administered 2024-06-23: 50 ug via INTRAVENOUS
  Administered 2024-06-23: 100 ug via INTRAVENOUS
  Administered 2024-06-23 (×2): 50 ug via INTRAVENOUS

## 2024-06-23 MED ORDER — CEFAZOLIN SODIUM-DEXTROSE 2-4 GM/100ML-% IV SOLN
2.0000 g | INTRAVENOUS | Status: AC
Start: 2024-06-23 — End: 2024-06-23
  Administered 2024-06-23: 2 g via INTRAVENOUS
  Filled 2024-06-23: qty 100

## 2024-06-23 MED ORDER — GLYCOPYRROLATE PF 0.2 MG/ML IJ SOSY
PREFILLED_SYRINGE | INTRAMUSCULAR | Status: AC
  Filled 2024-06-23: qty 1

## 2024-06-23 MED ORDER — DEXAMETHASONE SODIUM PHOSPHATE 10 MG/ML IJ SOLN
INTRAMUSCULAR | Status: AC
  Filled 2024-06-23: qty 2

## 2024-06-23 MED ORDER — QUETIAPINE FUMARATE ER 300 MG PO TB24
600.0000 mg | ORAL_TABLET | Freq: Every day | ORAL | Status: DC
  Administered 2024-06-23 – 2024-06-24 (×2): 600 mg via ORAL
  Filled 2024-06-23 (×2): qty 2

## 2024-06-23 MED ORDER — IPRATROPIUM-ALBUTEROL 0.5-2.5 (3) MG/3ML IN SOLN
3.0000 mL | RESPIRATORY_TRACT | Status: DC
  Administered 2024-06-23: 3 mL via RESPIRATORY_TRACT

## 2024-06-23 MED ORDER — HYDRALAZINE HCL 20 MG/ML IJ SOLN
10.0000 mg | Freq: Once | INTRAMUSCULAR | Status: AC
  Administered 2024-06-23: 10 mg via INTRAVENOUS

## 2024-06-23 MED ORDER — LIDOCAINE 2% (20 MG/ML) 5 ML SYRINGE
INTRAMUSCULAR | Status: AC
  Filled 2024-06-23: qty 10

## 2024-06-23 MED ORDER — PHENYLEPHRINE 80 MCG/ML (10ML) SYRINGE FOR IV PUSH (FOR BLOOD PRESSURE SUPPORT)
PREFILLED_SYRINGE | INTRAVENOUS | Status: AC
  Filled 2024-06-23: qty 10

## 2024-06-23 MED ORDER — PROPRANOLOL HCL 20 MG PO TABS
20.0000 mg | ORAL_TABLET | Freq: Every day | ORAL | Status: DC
  Administered 2024-06-24 – 2024-06-25 (×2): 20 mg via ORAL
  Filled 2024-06-23 (×2): qty 1

## 2024-06-23 MED ORDER — ONDANSETRON HCL 4 MG/2ML IJ SOLN
INTRAMUSCULAR | Status: DC | PRN
  Administered 2024-06-23: 4 mg via INTRAVENOUS

## 2024-06-23 MED ORDER — IPRATROPIUM-ALBUTEROL 0.5-2.5 (3) MG/3ML IN SOLN
RESPIRATORY_TRACT | Status: AC
  Filled 2024-06-23: qty 3

## 2024-06-23 MED ORDER — ROCURONIUM BROMIDE 10 MG/ML (PF) SYRINGE
PREFILLED_SYRINGE | INTRAVENOUS | Status: AC
  Filled 2024-06-23: qty 20

## 2024-06-23 MED ORDER — ROCURONIUM BROMIDE 10 MG/ML (PF) SYRINGE
PREFILLED_SYRINGE | INTRAVENOUS | Status: DC | PRN
  Administered 2024-06-23: 50 mg via INTRAVENOUS
  Administered 2024-06-23: 20 mg via INTRAVENOUS

## 2024-06-23 MED ORDER — 0.9 % SODIUM CHLORIDE (POUR BTL) OPTIME
TOPICAL | Status: DC | PRN
  Administered 2024-06-23: 1000 mL

## 2024-06-23 MED ORDER — OLANZAPINE 5 MG PO TABS
5.0000 mg | ORAL_TABLET | Freq: Every day | ORAL | Status: DC
  Administered 2024-06-23 – 2024-06-24 (×2): 5 mg via ORAL
  Filled 2024-06-23 (×2): qty 1

## 2024-06-23 MED ORDER — SUGAMMADEX SODIUM 200 MG/2ML IV SOLN
INTRAVENOUS | Status: DC | PRN
Start: 2024-06-23 — End: 2024-06-23
  Administered 2024-06-23: 200 mg via INTRAVENOUS

## 2024-06-23 SURGICAL SUPPLY — 41 items
BAG COUNTER SPONGE SURGICOUNT (BAG) ×1 IMPLANT
BINDER ABDOMINAL 12 ML 46-62 (SOFTGOODS) IMPLANT
CANISTER SUCTION 3000ML PPV (SUCTIONS) IMPLANT
CHLORAPREP W/TINT 26 (MISCELLANEOUS) ×1 IMPLANT
COVER SURGICAL LIGHT HANDLE (MISCELLANEOUS) ×1 IMPLANT
DERMABOND ADVANCED .7 DNX12 (GAUZE/BANDAGES/DRESSINGS) ×1 IMPLANT
DEVICE SECURE STRAP 25 ABSORB (INSTRUMENTS) ×1 IMPLANT
DEVICE TROCAR PUNCTURE CLOSURE (ENDOMECHANICALS) ×1 IMPLANT
DRAPE INCISE IOBAN 66X45 STRL (DRAPES) ×1 IMPLANT
DRAPE LAPAROSCOPIC ABDOMINAL (DRAPES) ×1 IMPLANT
ELECT CAUTERY BLADE 6.4 (BLADE) ×1 IMPLANT
ELECTRODE REM PT RTRN 9FT ADLT (ELECTROSURGICAL) ×1 IMPLANT
GLOVE BIO SURGEON STRL SZ7.5 (GLOVE) ×1 IMPLANT
GOWN STRL REUS W/ TWL LRG LVL3 (GOWN DISPOSABLE) ×3 IMPLANT
IRRIGATION SUCT STRKRFLW 2 WTP (MISCELLANEOUS) IMPLANT
KIT BASIN OR (CUSTOM PROCEDURE TRAY) ×1 IMPLANT
KIT TURNOVER KIT B (KITS) ×1 IMPLANT
MARKER SKIN DUAL TIP RULER LAB (MISCELLANEOUS) ×1 IMPLANT
MESH OVITEX CORE PERM 12X18 4L (Mesh General) IMPLANT
NDL SPNL 22GX3.5 QUINCKE BK (NEEDLE) ×1 IMPLANT
NEEDLE SPNL 22GX3.5 QUINCKE BK (NEEDLE) ×1 IMPLANT
PAD ARMBOARD POSITIONER FOAM (MISCELLANEOUS) ×2 IMPLANT
PENCIL BUTTON HOLSTER BLD 10FT (ELECTRODE) ×1 IMPLANT
SET TUBE SMOKE EVAC HIGH FLOW (TUBING) ×1 IMPLANT
SHEARS HARMONIC 36 ACE (MISCELLANEOUS) IMPLANT
SLEEVE Z-THREAD 5X100MM (TROCAR) ×1 IMPLANT
SOLN 0.9% NACL 1000 ML (IV SOLUTION) ×1 IMPLANT
SOLN 0.9% NACL POUR BTL 1000ML (IV SOLUTION) ×1 IMPLANT
SOLN STERILE WATER 1000 ML (IV SOLUTION) ×1 IMPLANT
SOLN STERILE WATER BTL 1000 ML (IV SOLUTION) ×1 IMPLANT
SUT MNCRL AB 4-0 PS2 18 (SUTURE) ×1 IMPLANT
SUT NOVA NAB DX-16 0-1 5-0 T12 (SUTURE) ×1 IMPLANT
SUT VIC AB 3-0 SH 27XBRD (SUTURE) ×1 IMPLANT
SUT VICRYL 0 UR6 27IN ABS (SUTURE) IMPLANT
TOWEL GREEN STERILE (TOWEL DISPOSABLE) ×1 IMPLANT
TOWEL GREEN STERILE FF (TOWEL DISPOSABLE) ×1 IMPLANT
TRAY FOLEY W/BAG SLVR 16FR ST (SET/KITS/TRAYS/PACK) ×1 IMPLANT
TRAY LAPAROSCOPIC MC (CUSTOM PROCEDURE TRAY) ×1 IMPLANT
TROCAR 11X100 Z THREAD (TROCAR) IMPLANT
TROCAR XCEL NON-BLD 5MMX100MML (ENDOMECHANICALS) IMPLANT
WARMER LAPAROSCOPE (MISCELLANEOUS) ×1 IMPLANT

## 2024-06-23 NOTE — Anesthesia Procedure Notes (Signed)
 Procedure Name: Intubation Date/Time: 06/23/2024 12:02 PM  Performed by: Vertie Russell NOVAK, CRNAPre-anesthesia Checklist: Patient identified, Emergency Drugs available, Suction available and Patient being monitored Patient Re-evaluated:Patient Re-evaluated prior to induction Oxygen Delivery Method: Circle System Utilized Preoxygenation: Pre-oxygenation with 100% oxygen Induction Type: IV induction Ventilation: Mask ventilation without difficulty and Oral airway inserted - appropriate to patient size Laryngoscope Size: Mac and 4 Grade View: Grade I Tube type: Oral Tube size: 7.0 mm Number of attempts: 1 Airway Equipment and Method: Stylet and Oral airway Placement Confirmation: ETT inserted through vocal cords under direct vision, positive ETCO2 and breath sounds checked- equal and bilateral Secured at: 22 cm Tube secured with: Tape Dental Injury: Teeth and Oropharynx as per pre-operative assessment

## 2024-06-23 NOTE — Anesthesia Preprocedure Evaluation (Addendum)
 Anesthesia Evaluation  Patient identified by MRN, date of birth, ID band Patient awake    Reviewed: Allergy & Precautions, NPO status , Patient's Chart, lab work & pertinent test results, reviewed documented beta blocker date and time   History of Anesthesia Complications (+) PONV and history of anesthetic complications  Airway Mallampati: II  TM Distance: >3 FB Neck ROM: Full    Dental  (+) Teeth Intact, Dental Advisory Given, Caps,    Pulmonary neg pulmonary ROS   Pulmonary exam normal breath sounds clear to auscultation       Cardiovascular negative cardio ROS Normal cardiovascular exam Rhythm:Regular Rate:Normal     Neuro/Psych  Headaches PSYCHIATRIC DISORDERS Anxiety Depression       GI/Hepatic Neg liver ROS,,,Incisional hernia, without obstruction or gangrene   Endo/Other  Hypothyroidism    Renal/GU negative Renal ROS     Musculoskeletal negative musculoskeletal ROS (+)    Abdominal   Peds  (+) ADHD Hematology negative hematology ROS (+)   Anesthesia Other Findings Day of surgery medications reviewed with the patient.  Reproductive/Obstetrics                              Anesthesia Physical Anesthesia Plan  ASA: 2  Anesthesia Plan: General   Post-op Pain Management: Gabapentin PO (pre-op)*, Tylenol  PO (pre-op)* and Toradol  IV (intra-op)*   Induction: Intravenous  PONV Risk Score and Plan: 4 or greater and Midazolam , Propofol  infusion, Dexamethasone  and Ondansetron   Airway Management Planned: Oral ETT  Additional Equipment:   Intra-op Plan:   Post-operative Plan: Extubation in OR  Informed Consent: I have reviewed the patients History and Physical, chart, labs and discussed the procedure including the risks, benefits and alternatives for the proposed anesthesia with the patient or authorized representative who has indicated his/her understanding and acceptance.      Dental advisory given  Plan Discussed with: CRNA  Anesthesia Plan Comments:          Anesthesia Quick Evaluation

## 2024-06-23 NOTE — Op Note (Signed)
 06/23/2024  2:01 PM  PATIENT:  Andrea [name]  73 y.o. female  PRE-OPERATIVE DIAGNOSIS:  ventral hernia  POST-OPERATIVE DIAGNOSIS:  ventral hernia  PROCEDURE:  Procedure(s) with comments: LAPAROSCOPIC VENTRAL HERNIA REPAIR WITH MESH   SURGEON:  Surgeons and Role:    * Curvin Deward MOULD, MD - Primary  PHYSICIAN ASSISTANT:   ASSISTANTS: none   ANESTHESIA:   local and general  EBL:  20cc   BLOOD ADMINISTERED:none  DRAINS: none   LOCAL MEDICATIONS USED:  MARCAINE      SPECIMEN:  No Specimen  DISPOSITION OF SPECIMEN:  N/A  COUNTS:  YES  TOURNIQUET:  * No tourniquets in log *  DICTATION: .Dragon Dictation  After informed consent was obtained the patient was brought to the operating room and placed in the supine position on the operating room table.  After adequate induction of general anesthesia the patient's abdomen was prepped with ChloraPrep, allowed to dry, and draped in usual sterile manner including the use of an Ioban drape.  An appropriate timeout was performed.  A site was chosen in the right upper quadrant to access the abdominal cavity.  The area was infiltrated with quarter percent Marcaine .  A small stab incision is made with a 15 blade knife.  A 5 mm Optiview port and camera were used to bluntly dissected the layers of the abdominal wall under direct vision until access was gained to the abdominal cavity.  The abdomen was then insufflated with carbon oxide without difficulty.  Another 5 mm port was placed in the right lower quadrant under direct vision and a third port in the left mid abdomen and also under direct vision.  There was 1 filmy adhesions of the omentum to the anterior abdominal wall.  This was taken down sharply with the harmonic scalpel.  Once this was accomplished the entire anterior abdominal wall was free they could be visualized.  The fascial defect was estimated to be 6 cm in diameter.  I chose a 12 x 18 cm piece of Ovitex LPR mesh.  Six #1 Novafil stitches  were placed at equidistant points around the edge of the mesh.  The mesh was oriented with the blue side towards the anterior abdominal wall.  Next a small incision was made through the middle of the hernia defect with a 15 blade knife.  The incision was carried through the skin and subcutaneous tissue sharply with the electrocautery until the hernia sac was opened.  The hernia sac was excised sharply with the electrocautery.  The fascial edges were identified.  I then placed the mesh with the appropriate orientation into the abdominal cavity through this opening.  The fascial defect was then closed with multiple interrupted #1 Novafil stitches.  The abdomen was then insufflated again.  6 small stab incisions were made at points corresponding to the 6 stitches.  A suture passer was used to bring the tails of each stitch through the abdominal wall at each site.  Each of the stitches was then cinched down and tied.  The mesh was observed to be in good apposition to the anterior abdominal wall.  The gaps between the stitches were then filled in with a secure strap absorbable tacker.  Once this was accomplished and the mesh was observed to still be in good apposition to the anterior abdominal wall without any gaps or redundancy.  The area was examined and found to be hemostatic.  The rest of the abdomen was examined and no other abnormalities were  noted.  The gas was then allowed to escape and the mesh was observed to maintain good apposition to the abdominal wall.  Next the subcutaneous tissue of the hernia incision was closed with a running 3-0 Vicryl stitch.  The skin incisions were then closed with 4-0 Monocryl subcuticular stitches.  Dermabond dressings were applied.  The patient tolerated the procedure well.  At the end of the case all needle sponge and instrument counts were correct.  The patient was then awakened and taken to recovery in stable condition.  PLAN OF CARE: Admit for overnight  observation  PATIENT DISPOSITION:  PACU - hemodynamically stable.   Delay start of Pharmacological VTE agent (>24hrs) due to surgical blood loss or risk of bleeding: yes

## 2024-06-23 NOTE — Anesthesia Postprocedure Evaluation (Signed)
 Anesthesia Post Note  Patient: Andrea [name]  Procedure(s) Performed: LAPAROSCOPIC VENTRAL HERNIA REPAIR WITH MESH (Abdomen)     Patient location during evaluation: PACU Anesthesia Type: General Level of consciousness: awake and alert Pain management: pain level controlled Vital Signs Assessment: post-procedure vital signs reviewed and stable Respiratory status: spontaneous breathing, nonlabored ventilation, respiratory function stable and patient connected to nasal cannula oxygen Cardiovascular status: blood pressure returned to baseline and stable Postop Assessment: no apparent nausea or vomiting Anesthetic complications: no   No notable events documented.  Last Vitals:  Vitals:   06/23/24 1419 06/23/24 1430  BP: (!) 166/104 (!) 177/108  Pulse: 83 82  Resp: 11 14  Temp: (!) 36.4 C   SpO2: 94% 92%    Last Pain:  Vitals:   06/23/24 1431  TempSrc:   PainSc: 7                  Garnette FORBES Skillern

## 2024-06-23 NOTE — H&P (Signed)
 REFERRING PHYSICIAN: Mat Rosaline CROME, MD PROVIDER: DEWARD GARNETTE NULL, MD MRN: [id number] DOB: 06-14-1951 Subjective   Chief Complaint: New Problem ( incisional hernia w/o obst or gang.)  History of Present Illness: Andrea [name] is a 73 y.o. female who is seen today as an office consultation for evaluation of New Problem ( incisional hernia w/o obst or gang.)  We are asked to see the patient in consultation by Dr. Mat to evaluate her for a ventral hernia. The patient is a 73 year old white female who had an open appendectomy at [facility] about a year ago. Since that time she has felt bloated with some bulging along her lower abdominal incision. She denies any acute pain. She denies any nausea or vomiting. Her appetite is good and her bowels are working normally. She also had a vaginal hysterectomy about 6 weeks ago but has no problems recovering from that.  Review of Systems: A complete review of systems was obtained from the patient. I have reviewed this information and discussed as appropriate with the patient. See HPI as well for other ROS.  ROS   Medical History: Past Medical History:  Diagnosis Date  Allergic state  Anxiety  High cholesterol  Thyroid  disease   Patient Active Problem List  Diagnosis  Incisional hernia without obstruction or gangrene   Past Surgical History:  Procedure Laterality Date  FRACTURE SURGERY 04/16/2012  Open reduction and internal fixation of right proximal humerus  APPENDECTOMY  breast implants  FUNCTIONAL ENDOSCOPIC SINUS SURGERY    Allergies  Allergen Reactions  Codeine Nausea  Hydrocodone Nausea  Morphine  Other (See Comments)  Passes out  Vicodin [Hydrocodone-Acetaminophen ] Other (See Comments)  Passes out   Current Outpatient Medications on File Prior to Visit  Medication Sig Dispense Refill  ALPRAZolam  (XANAX ) 0.5 MG tablet Take 0.5 mg by mouth nightly as needed for Sleep.  atorvastatin  (LIPITOR) 10 MG tablet Take 10 mg by mouth  once daily.  calcium  citrate-vitamin D3 (CITRACAL+D) 315-200 mg-unit tablet Take 2 tablets by mouth 2 (two) times daily with meals.  citalopram (CELEXA) 20 MG tablet Take 20 mg by mouth once daily. (Patient not taking: Reported on 01/21/2024)  clonazePAM  (KLONOPIN ) 1 MG tablet TAKE 1 TABLET BY MOUTH AT BEDTIME. (STOP ALPRAZOLAM )  levocetirizine (XYZAL) 5 MG tablet Take 5 mg by mouth every evening.  levothyroxine  (SYNTHROID ) 75 MCG tablet Take 75 mcg by mouth once daily  levothyroxine  50 mcg Take 50 mcg by mouth once daily. (Patient not taking: Reported on 01/21/2024)  methylphenidate  HCl (CONCERTA ) 54 MG ER tablet Take 54 mg by mouth every morning  minoxidiL 2.5 MG tablet Take 2.5 mg by mouth once daily  nitrofurantoin, macrocrystal-monohydrate, (MACROBID) 100 MG capsule TAKE 1 CAPSULE (ORAL) 2 TIMES PER DAY FOR 5 DAYS MUST ADMINISTER WITH A MEAL/FOOD  predniSONE (DELTASONE) 10 MG tablet 6 PO Q D X 1 DAY, THEN 5 PO Q D X 1 DAY, THEN 4 PO Q D X 1 DAY, THEN 3 PO Q D X 1 DAY, THEN 2 PO Q D X 1 DAY, THEN 1 PO Q D X 1 DAY (Patient not taking: Reported on 01/21/2024) 21 tablet 0  promethazine  (PHENERGAN ) 25 MG tablet Take 1 tablet (25 mg total) by mouth every 6 (six) hours as needed for Nausea. 30 tablet 1  propranoloL  (INDERAL ) 10 MG tablet TAKE 2 TO 3 TABLETS BY MOUTH TWICE A DAY AS NEEDED FOR TREMORS  QUEtiapine  (SEROQUEL  XR) 50 mg XR tablet TAKE 1 TABLET AT NIGHT FOR 2 NIGHTS,  THEN 2 TABS AT NIGHT FOR 2 NIGHTS , THEN 3 TABS NIGHTLY  raloxifene  (EVISTA ) 60 mg tablet Take 60 mg by mouth once daily.  sertraline  (ZOLOFT ) 100 MG tablet Take 200 mg by mouth at bedtime  sertraline  (ZOLOFT ) 50 MG tablet Take 100 mg by mouth at bedtime   No current facility-administered medications on file prior to visit.   Family History  Problem Relation Age of Onset  Parkinsonism Father    Social History   Tobacco Use  Smoking Status Never  Smokeless Tobacco Never    Social History   Socioeconomic History   Marital status: Married  Tobacco Use  Smoking status: Never  Smokeless tobacco: Never  Vaping Use  Vaping status: Never Used  Substance and Sexual Activity  Alcohol use: No  Drug use: No  Sexual activity: Yes  Partners: Male  Birth control/protection: Post-menopausal   Social Drivers of Corporate investment banker Strain: Low Risk (09/21/2023)  Received from [facility]  Overall Financial Resource Strain (CARDIA)  Difficulty of Paying Living Expenses: Not hard at all  Food Insecurity: No Food Insecurity (09/21/2023)  Received from [facility]  Hunger Vital Sign  Within the past 12 months, you worried that your food would run out before you got the money to buy more.: Never true  Within the past 12 months, the food you bought just didn't last and you didn't have money to get more.: Never true  Transportation Needs: No Transportation Needs (09/21/2023)  Received from [facility] - [facility]  Lack of Transportation (Medical): No  Lack of Transportation (Non-Medical): No  Physical Activity: Sufficiently Active (09/21/2023)  Received from [facility]  Exercise Vital Sign  On average, how many days per week do you engage in moderate to strenuous exercise (like a brisk walk)?: 5 days  On average, how many minutes do you engage in exercise at this level?: 90 min  Stress: No Stress Concern Present (09/21/2023)  Received from [facility] - Occupational Stress Questionnaire  Feeling of Stress : Not at all  Social Connections: Socially Integrated (09/21/2023)  Received from [facility]  Social Connection and Isolation Panel  In a typical week, how many times do you talk on the phone with family, friends, or neighbors?: Three times a week  How often do you get together with friends or relatives?: Once a week  How often do you attend church or religious services?: More than 4 times per year  Do you belong to any clubs or  organizations such as church groups, unions, fraternal or athletic groups, or school groups?: Yes  How often do you attend meetings of the clubs or organizations you belong to?: More than 4 times per year  Are you married, widowed, divorced, separated, never married, or living with a partner?: Married  Housing Stability: Unknown (01/21/2024)  Housing Stability Vital Sign  Homeless in the Last Year: No   Objective:   Vitals:  BP: 128/85  Pulse: 94  Resp: 16  Temp: 36.8 C (98.2 F)  SpO2: 98%  Weight: 66.1 kg (145 lb 12.8 oz)  Height: 157.5 cm (5' 2)  PainSc: 0-No pain   Body mass index is 26.67 kg/m.  Physical Exam Vitals reviewed.  Constitutional:  General: She is not in acute distress. Appearance: Normal appearance.  HENT:  Head: Normocephalic and atraumatic.  Right Ear: External ear normal.  Left Ear: External ear normal.  Nose: Nose normal.  Mouth/Throat:  Mouth:  Mucous membranes are moist.  Pharynx: Oropharynx is clear.  Eyes:  General: No scleral icterus. Extraocular Movements: Extraocular movements intact.  Conjunctiva/sclera: Conjunctivae normal.  Pupils: Pupils are equal, round, and reactive to light.  Cardiovascular:  Rate and Rhythm: Normal rate and regular rhythm.  Pulses: Normal pulses.  Heart sounds: Normal heart sounds.  Pulmonary:  Effort: Pulmonary effort is normal. No respiratory distress.  Breath sounds: Normal breath sounds.  Abdominal:  General: Bowel sounds are normal.  Palpations: Abdomen is soft.  Tenderness: There is no abdominal tenderness.  Comments: The abdomen is soft and nontender. There is a moderate size bulge just below the umbilicus. I can palpate a fascial defect. The hernia itself feels about 6 cm in diameter and reduces easily  Musculoskeletal:  General: No swelling, tenderness or deformity. Normal range of motion.  Cervical back: Normal range of motion and neck supple.  Skin: General: Skin is warm and dry.  Coloration:  Skin is not jaundiced.  Neurological:  General: No focal deficit present.  Mental Status: She is alert and oriented to person, place, and time.  Psychiatric:  Mood and Affect: Mood normal.  Behavior: Behavior normal.     Labs, Imaging and Diagnostic Testing:  Assessment and Plan:   Diagnoses and all orders for this visit:  Incisional hernia without obstruction or gangrene - [facility] Case Posting Request; Future   The patient appears to have a ventral incisional hernia likely related to her surgery about a year ago. Because of the risk of incarceration and strangulation I feel she would benefit from having the hernia fixed. She would also like to have this done. I have discussed with her in detail the risks and benefits of the operation as well as some of the technical aspects including use of mesh and the risk of chronic pain as well as the risk of bowel injury and she understands and wishes to proceed. She would be a good candidate for a laparoscopic assisted type repair. We will move forward with surgical scheduling

## 2024-06-23 NOTE — Interval H&P Note (Signed)
 History and Physical Interval Note:  06/23/2024 11:24 AM  Andrea [name]  has presented today for surgery, with the diagnosis of ventral hernia.  The various methods of treatment have been discussed with the patient and family. After consideration of risks, benefits and other options for treatment, the patient has consented to  Procedure(s) with comments: REPAIR, HERNIA, VENTRAL, LAPAROSCOPIC (N/A) - LAPAROSCOPIC VENTRAL HERNIA REPAIR WITH MESH as a surgical intervention.  The patient's history has been reviewed, patient examined, no change in status, stable for surgery.  I have reviewed the patient's chart and labs.  Questions were answered to the patient's satisfaction.     Deward Null III

## 2024-06-23 NOTE — Transfer of Care (Signed)
 Immediate Anesthesia Transfer of Care Note  Patient: Andrea [name]  Procedure(s) Performed: LAPAROSCOPIC VENTRAL HERNIA REPAIR WITH MESH (Abdomen)  Patient Location: PACU  Anesthesia Type:General  Level of Consciousness: awake and alert   Airway & Oxygen Therapy: Patient Spontanous Breathing and Patient connected to nasal cannula oxygen  Post-op Assessment: Report given to RN and Post -op Vital signs reviewed and stable  Post vital signs: Reviewed and stable  Last Vitals:  Vitals Value Taken Time  BP 166/104 06/23/24 14:19  Temp    Pulse 83 06/23/24 14:23  Resp 15 06/23/24 14:23  SpO2 90 % 06/23/24 14:23  Vitals shown include unfiled device data.  Last Pain:  Vitals:   06/23/24 1055  TempSrc:   PainSc: 0-No pain         Complications: No notable events documented.

## 2024-06-24 ENCOUNTER — Encounter (HOSPITAL_COMMUNITY): Payer: Self-pay | Admitting: General Surgery

## 2024-06-24 DIAGNOSIS — E039 Hypothyroidism, unspecified: Secondary | ICD-10-CM | POA: Diagnosis present

## 2024-06-24 DIAGNOSIS — F419 Anxiety disorder, unspecified: Secondary | ICD-10-CM | POA: Diagnosis present

## 2024-06-24 DIAGNOSIS — G8918 Other acute postprocedural pain: Secondary | ICD-10-CM | POA: Diagnosis present

## 2024-06-24 DIAGNOSIS — K567 Ileus, unspecified: Secondary | ICD-10-CM | POA: Diagnosis present

## 2024-06-24 DIAGNOSIS — E7800 Pure hypercholesterolemia, unspecified: Secondary | ICD-10-CM | POA: Diagnosis present

## 2024-06-24 DIAGNOSIS — Z79890 Hormone replacement therapy (postmenopausal): Secondary | ICD-10-CM | POA: Diagnosis not present

## 2024-06-24 DIAGNOSIS — K9189 Other postprocedural complications and disorders of digestive system: Secondary | ICD-10-CM | POA: Diagnosis present

## 2024-06-24 DIAGNOSIS — Z79899 Other long term (current) drug therapy: Secondary | ICD-10-CM | POA: Diagnosis not present

## 2024-06-24 DIAGNOSIS — F32A Depression, unspecified: Secondary | ICD-10-CM | POA: Diagnosis present

## 2024-06-24 DIAGNOSIS — Z90710 Acquired absence of both cervix and uterus: Secondary | ICD-10-CM | POA: Diagnosis not present

## 2024-06-24 DIAGNOSIS — K432 Incisional hernia without obstruction or gangrene: Secondary | ICD-10-CM | POA: Diagnosis present

## 2024-06-24 DIAGNOSIS — Z9882 Breast implant status: Secondary | ICD-10-CM | POA: Diagnosis not present

## 2024-06-24 LAB — CBC
HCT: 35.4 % — ABNORMAL LOW (ref 36.0–46.0)
Hemoglobin: 12.2 g/dL (ref 12.0–15.0)
MCH: 32.6 pg (ref 26.0–34.0)
MCHC: 34.5 g/dL (ref 30.0–36.0)
MCV: 94.7 fL (ref 80.0–100.0)
Platelets: 261 K/uL (ref 150–400)
RBC: 3.74 MIL/uL — ABNORMAL LOW (ref 3.87–5.11)
RDW: 12.4 % (ref 11.5–15.5)
WBC: 12.7 K/uL — ABNORMAL HIGH (ref 4.0–10.5)
nRBC: 0 % (ref 0.0–0.2)

## 2024-06-24 LAB — BASIC METABOLIC PANEL WITH GFR
Anion gap: 10 (ref 5–15)
BUN: 5 mg/dL — ABNORMAL LOW (ref 8–23)
CO2: 22 mmol/L (ref 22–32)
Calcium: 8.2 mg/dL — ABNORMAL LOW (ref 8.9–10.3)
Chloride: 99 mmol/L (ref 98–111)
Creatinine, Ser: 0.54 mg/dL (ref 0.44–1.00)
GFR, Estimated: >60 mL/min (ref >60)
Glucose, Bld: 122 mg/dL — ABNORMAL HIGH (ref 70–99)
Potassium: 3.8 mmol/L (ref 3.5–5.1)
Sodium: 131 mmol/L — ABNORMAL LOW (ref 135–145)

## 2024-06-24 MED ORDER — KETOROLAC TROMETHAMINE 30 MG/ML IJ SOLN
30.0000 mg | Freq: Three times a day (TID) | INTRAMUSCULAR | Status: DC
  Administered 2024-06-24 – 2024-06-25 (×4): 30 mg via INTRAVENOUS
  Filled 2024-06-24 (×4): qty 1

## 2024-06-24 MED ORDER — ACETAMINOPHEN 10 MG/ML IV SOLN
1000.0000 mg | Freq: Four times a day (QID) | INTRAVENOUS | Status: AC
  Administered 2024-06-24 – 2024-06-25 (×4): 1000 mg via INTRAVENOUS
  Filled 2024-06-24 (×4): qty 100

## 2024-06-24 MED ORDER — ACETAMINOPHEN 325 MG PO TABS
650.0000 mg | ORAL_TABLET | Freq: Four times a day (QID) | ORAL | Status: DC | PRN
  Filled 2024-06-24: qty 2

## 2024-06-24 MED ORDER — SODIUM CHLORIDE 0.9 % IV SOLN
12.5000 mg | Freq: Four times a day (QID) | INTRAVENOUS | Status: DC | PRN
  Administered 2024-06-24 (×2): 12.5 mg via INTRAVENOUS
  Filled 2024-06-24 (×3): qty 0.5

## 2024-06-24 NOTE — Progress Notes (Signed)
 Transition of Care [facility]) - Inpatient Brief Assessment   Patient Details  Name: Andrea [name] MRN: [id number] Date of Birth: 1951/07/27  Transition of Care [facility]) CM/SW Contact:    Rosaline JONELLE Joe, RN Phone Number: 06/24/2024, 10:53 AM   Clinical Narrative: CM met with the patient and spouse at the bedside - S/P hernia repair.  Patient is being followed by [facility].  No IP Care management needs at this time.   Transition of Care Asessment: Insurance and Status: (P) Insurance coverage has been reviewed Patient has primary care physician: (P) Yes Home environment has been reviewed: (P) from home with spouse Prior level of function:: (P) self Prior/Current Home Services: (P) No current home services Social Drivers of Health Review: (P) SDOH reviewed no interventions necessary Readmission risk has been reviewed: (P) Yes Transition of care needs: (P) no transition of care needs at this time

## 2024-06-24 NOTE — Plan of Care (Signed)

## 2024-06-24 NOTE — Progress Notes (Signed)
 1 Day Post-Op   Subjective/Chief Complaint: Complains of pain and nausea overnight   Objective: Vital signs in last 24 hours: Temp:  [96.4 F (35.8 C)-98.6 F (37 C)] 98.6 F (37 C) (09/30 0156) Pulse Rate:  [81-97] 84 (09/30 0156) Resp:  [10-20] 18 (09/30 0156) BP: (137-192)/(75-117) 144/80 (09/30 0156) SpO2:  [92 %-100 %] 96 % (09/30 0156) Weight:  [61.7 kg] 61.7 kg (09/29 1022) Last BM Date : 06/21/24  Intake/Output from previous day: 09/29 0701 - 09/30 0700 In: 1527.1 [P.O.:237; I.V.:1290.1] Out: 15 [Blood:15] Intake/Output this shift: No intake/output data recorded.  General appearance: alert and cooperative Resp: clear to auscultation bilaterally Cardio: regular rate and rhythm GI: soft, quiet. Incisions look good  Lab Results:  No results for input(s): WBC, HGB, HCT, PLT in the last 72 hours. BMET No results for input(s): NA, K, CL, CO2, GLUCOSE, BUN, CREATININE, CALCIUM  in the last 72 hours. PT/INR No results for input(s): LABPROT, INR in the last 72 hours. ABG No results for input(s): PHART, HCO3 in the last 72 hours.  Invalid input(s): PCO2, PO2  Studies/Results: No results found.  Anti-infectives: Anti-infectives (From admission, onward)    Start     Dose/Rate Route Frequency Ordered Stop   06/23/24 1030  ceFAZolin (ANCEF) IVPB 2g/100 mL premix        2 g 200 mL/hr over 30 Minutes Intravenous On call to O.R. 06/23/24 1017 06/23/24 1205       Assessment/Plan: s/p Procedure(s) with comments: LAPAROSCOPIC VENTRAL HERNIA REPAIR WITH MESH (N/A) - LAPAROSCOPIC VENTRAL HERNIA REPAIR WITH MESH POD 1 Work on pain control and nausea Admit to inpatient  LOS: 0 days    Andrea [name] III 06/24/2024

## 2024-06-24 NOTE — Progress Notes (Signed)
 Mobility Specialist Progress Note:   06/24/24 1200  Mobility  Activity Ambulated with assistance  Level of Assistance Contact guard assist, steadying assist  Assistive Device Other (Comment) (HHA + IV pole)  Distance Ambulated (ft) 200 ft  Activity Response Tolerated fair (abdominal pain)  Mobility Referral Yes  Mobility visit 1 Mobility  Mobility Specialist Start Time (ACUTE ONLY) 1200  Mobility Specialist Stop Time (ACUTE ONLY) 1216  Mobility Specialist Time Calculation (min) (ACUTE ONLY) 16 min   Pt ambulating in hallway with daughter and IV pole. C/o significant abdominal pain. Able to stand again from EOB with light minA and educated on log-roll technique for bed mobility without much relief. Pt back in the bed with all needs met, encouraged frequent OOB mobility.   Therisa Rana Mobility Specialist Please contact via SecureChat or  Rehab office at [phone]

## 2024-06-25 ENCOUNTER — Other Ambulatory Visit: Payer: Self-pay | Admitting: Psychiatry

## 2024-06-25 DIAGNOSIS — F339 Major depressive disorder, recurrent, unspecified: Secondary | ICD-10-CM

## 2024-06-25 MED ORDER — ONDANSETRON 4 MG PO TBDP: 4.0000 mg | ORAL_TABLET | Freq: Four times a day (QID) | ORAL | 0 refills | Status: AC | PRN

## 2024-06-25 MED ORDER — DOCUSATE SODIUM 100 MG PO CAPS
100.0000 mg | ORAL_CAPSULE | Freq: Two times a day (BID) | ORAL | Status: DC
  Administered 2024-06-25: 100 mg via ORAL
  Filled 2024-06-25: qty 1

## 2024-06-25 NOTE — Telephone Encounter (Signed)
 Ineffective augmentation for dep

## 2024-06-25 NOTE — Progress Notes (Signed)
 2 Days Post-Op   Subjective/Chief Complaint: Feels better today. Nausea has resolved   Objective: Vital signs in last 24 hours: Temp:  [97.4 F (36.3 C)-99.3 F (37.4 C)] 97.4 F (36.3 C) (10/01 0601) Pulse Rate:  [81-101] 85 (10/01 0601) Resp:  [16-18] 18 (09/30 2355) BP: (122-147)/(61-85) 134/84 (10/01 0601) SpO2:  [91 %-96 %] 92 % (10/01 0601) Last BM Date : 06/21/24  Intake/Output from previous day: 09/30 0701 - 10/01 0700 In: 250 [IV Piggyback:250] Out: -  Intake/Output this shift: No intake/output data recorded.  General appearance: alert and cooperative Resp: clear to auscultation bilaterally Cardio: regular rate and rhythm GI: soft, mild tenderness. Good bs. Incisions good  Lab Results:  Recent Labs    06/24/24 0847  WBC 12.7*  HGB 12.2  HCT 35.4*  PLT 261   BMET Recent Labs    06/24/24 0847  NA 131*  K 3.8  CL 99  CO2 22  GLUCOSE 122*  BUN 5*  CREATININE 0.54  CALCIUM  8.2*   PT/INR No results for input(s): LABPROT, INR in the last 72 hours. ABG No results for input(s): PHART, HCO3 in the last 72 hours.  Invalid input(s): PCO2, PO2  Studies/Results: No results found.  Anti-infectives: Anti-infectives (From admission, onward)    Start     Dose/Rate Route Frequency Ordered Stop   06/23/24 1030  ceFAZolin (ANCEF) IVPB 2g/100 mL premix        2 g 200 mL/hr over 30 Minutes Intravenous On call to O.R. 06/23/24 1017 06/23/24 1205       Assessment/Plan: s/p Procedure(s) with comments: LAPAROSCOPIC VENTRAL HERNIA REPAIR WITH MESH (N/A) - LAPAROSCOPIC VENTRAL HERNIA REPAIR WITH MESH Advance diet Discharge POD 2  LOS: 1 day    Deward Null III 06/25/2024

## 2024-06-25 NOTE — Plan of Care (Signed)

## 2024-06-26 ENCOUNTER — Other Ambulatory Visit: Payer: Self-pay

## 2024-06-26 ENCOUNTER — Emergency Department (HOSPITAL_COMMUNITY)

## 2024-06-26 ENCOUNTER — Encounter (HOSPITAL_COMMUNITY): Payer: Self-pay

## 2024-06-26 ENCOUNTER — Emergency Department (HOSPITAL_COMMUNITY)
Admission: EM | Admit: 2024-06-26 | Discharge: 2024-06-27 | Disposition: A | Discharge status: Home / Self Care | Attending: Emergency Medicine | Admitting: Emergency Medicine

## 2024-06-26 DIAGNOSIS — E871 Hypo-osmolality and hyponatremia: Secondary | ICD-10-CM | POA: Diagnosis not present

## 2024-06-26 DIAGNOSIS — Z79899 Other long term (current) drug therapy: Secondary | ICD-10-CM | POA: Diagnosis not present

## 2024-06-26 DIAGNOSIS — D72829 Elevated white blood cell count, unspecified: Secondary | ICD-10-CM | POA: Diagnosis not present

## 2024-06-26 DIAGNOSIS — K6289 Other specified diseases of anus and rectum: Secondary | ICD-10-CM | POA: Diagnosis not present

## 2024-06-26 DIAGNOSIS — K5641 Fecal impaction: Secondary | ICD-10-CM | POA: Insufficient documentation

## 2024-06-26 DIAGNOSIS — I10 Essential (primary) hypertension: Secondary | ICD-10-CM | POA: Diagnosis not present

## 2024-06-26 DIAGNOSIS — R935 Abnormal findings on diagnostic imaging of other abdominal regions, including retroperitoneum: Secondary | ICD-10-CM | POA: Diagnosis not present

## 2024-06-26 DIAGNOSIS — G8918 Other acute postprocedural pain: Secondary | ICD-10-CM | POA: Insufficient documentation

## 2024-06-26 DIAGNOSIS — R188 Other ascites: Secondary | ICD-10-CM | POA: Diagnosis not present

## 2024-06-26 DIAGNOSIS — K439 Ventral hernia without obstruction or gangrene: Secondary | ICD-10-CM | POA: Diagnosis not present

## 2024-06-26 LAB — COMPREHENSIVE METABOLIC PANEL WITH GFR
ALT: 15 U/L (ref 0–44)
AST: 24 U/L (ref 15–41)
Albumin: 3.4 g/dL — ABNORMAL LOW (ref 3.5–5.0)
Alkaline Phosphatase: 54 U/L (ref 38–126)
Anion gap: 11 (ref 5–15)
BUN: 9 mg/dL (ref 8–23)
CO2: 22 mmol/L (ref 22–32)
Calcium: 8.8 mg/dL — ABNORMAL LOW (ref 8.9–10.3)
Chloride: 93 mmol/L — ABNORMAL LOW (ref 98–111)
Creatinine, Ser: 0.71 mg/dL (ref 0.44–1.00)
GFR, Estimated: >60 mL/min (ref >60)
Glucose, Bld: 113 mg/dL — ABNORMAL HIGH (ref 70–99)
Potassium: 4.1 mmol/L (ref 3.5–5.1)
Sodium: 126 mmol/L — ABNORMAL LOW (ref 135–145)
Total Bilirubin: 0.5 mg/dL (ref 0.0–1.2)
Total Protein: 6.9 g/dL (ref 6.5–8.1)

## 2024-06-26 LAB — CBC
HCT: 35.6 % — ABNORMAL LOW (ref 36.0–46.0)
Hemoglobin: 12 g/dL (ref 12.0–15.0)
MCH: 32.2 pg (ref 26.0–34.0)
MCHC: 33.7 g/dL (ref 30.0–36.0)
MCV: 95.4 fL (ref 80.0–100.0)
Platelets: 309 K/uL (ref 150–400)
RBC: 3.73 MIL/uL — ABNORMAL LOW (ref 3.87–5.11)
RDW: 12.2 % (ref 11.5–15.5)
WBC: 11 K/uL — ABNORMAL HIGH (ref 4.0–10.5)
nRBC: 0 % (ref 0.0–0.2)

## 2024-06-26 LAB — LIPASE, BLOOD: Lipase: 23 U/L (ref 11–51)

## 2024-06-26 MED ORDER — IOHEXOL 350 MG/ML SOLN
65.0000 mL | Freq: Once | INTRAVENOUS | Status: AC | PRN
  Administered 2024-06-26: 65 mL via INTRAVENOUS

## 2024-06-26 MED ORDER — SODIUM CHLORIDE 0.9 % IV BOLUS
1000.0000 mL | Freq: Once | INTRAVENOUS | Status: AC
  Administered 2024-06-26: 1000 mL via INTRAVENOUS

## 2024-06-26 MED ORDER — ONDANSETRON HCL 4 MG/2ML IJ SOLN
4.0000 mg | Freq: Once | INTRAMUSCULAR | Status: AC
  Administered 2024-06-26: 4 mg via INTRAVENOUS
  Filled 2024-06-26: qty 2

## 2024-06-26 NOTE — ED Provider Notes (Signed)
 [place] EMERGENCY DEPARTMENT AT [facility] Provider Note   CSN: [id number] Arrival date & time: 06/26/24  2012     Patient presents with: Constipation   Andrea [name] is a 73 y.o. female who is 3 days postop from laparoscopic ventral hernia repair with mesh who presents with concern for severe rectal pain, no bowel movements x 7 days, patient denies flatulence since discharge home. Denies fevers or chills, abdominal pain which the patient feels appropriate postoperatively.  Nausea but no vomiting.  Patient has used Colace, MiraLAX, suppositories, and attempted manual disimpaction at home unsuccessfully.  {Add pertinent medical, surgical, social history, OB history to HPI:32947} HPI     Prior to Admission medications   Medication Sig Start Date End Date Taking? Authorizing Provider  ALPRAZolam  (XANAX ) 0.5 MG tablet Take 1 tablet (0.5 mg total) by mouth 2 (two) times daily as needed for anxiety. Patient taking differently: Take 0.5 mg by mouth at bedtime. 05/27/24   Cottle, Lorene KANDICE Raddle., MD  atorvastatin  (LIPITOR) 20 MG tablet Take 20 mg by mouth daily. 02/20/18   [provider]  calcium  citrate-vitamin D  (CITRACAL+D) 315-200 MG-UNIT tablet Take 1 tablet by mouth daily.    [provider]  celecoxib  (CELEBREX ) 400 MG capsule Take 1 capsule (400 mg total) by mouth daily after breakfast. Patient not taking: Reported on 06/17/2024 05/02/24   Cottle, Lorene KANDICE Raddle., MD  fluticasone  (FLONASE ) 50 MCG/ACT nasal spray Place 2 sprays into both nostrils daily as needed for allergies. 10/06/14   [provider]  L-Methylfolate (DEPLIN FC) 15 MG CAPS Take 15 mg by mouth daily at 12 noon. 03/12/24   Cottle, Lorene KANDICE Raddle., MD  levothyroxine  (SYNTHROID ) 75 MCG tablet Take 75 mcg by mouth daily before breakfast.    [provider]  methylphenidate  (CONCERTA ) 27 MG PO CR tablet Take 1 tablet (27 mg total) by mouth every morning. 06/06/24   Cottle, Lorene KANDICE Raddle., MD   minoxidil (LONITEN) 2.5 MG tablet Take 2.5 mg by mouth daily. Uses for hair loss    [provider]  Multiple Vitamin (MULTIVITAMIN WITH MINERALS) TABS tablet Take 1 tablet by mouth daily.    [provider]  OLANZapine  (ZYPREXA ) 5 MG tablet TAKE 1 TABLET BY MOUTH EVERYDAY AT BEDTIME Patient taking differently: Take 5 mg by mouth every evening. 04/29/24   Cottle, Lorene KANDICE Raddle., MD  ondansetron  (ZOFRAN -ODT) 4 MG disintegrating tablet Take 1 tablet (4 mg total) by mouth every 6 (six) hours as needed for nausea. 06/25/24   Curvin Mt III, MD  propranolol  (INDERAL ) 10 MG tablet Take 20 mg by mouth in the morning. Takes for tremors- hands    [provider]  QUEtiapine  (SEROQUEL  XR) 300 MG 24 hr tablet TAKE 2 TABLETS (600 MG TOTAL) BY MOUTH AT BEDTIME. Patient taking differently: Take 600 mg by mouth daily. Takes at 6 PM. 05/12/24   Cottle, Lorene KANDICE Raddle., MD  raloxifene  (EVISTA ) 60 MG tablet Take 60 mg by mouth daily. 10/07/14   [provider]  sertraline  (ZOLOFT ) 100 MG tablet Take 1.5 tablets (150 mg total) by mouth at bedtime. Patient taking differently: Take 150 mg by mouth in the morning. 03/12/24   Cottle, Lorene KANDICE Raddle., MD    Allergies: Hydrocodone-acetaminophen  and Morphine     Review of Systems  Constitutional: Negative.   HENT: Negative.    Respiratory: Negative.    Gastrointestinal:  Positive for abdominal pain, constipation, nausea and rectal pain. Negative for vomiting.  Genitourinary: Negative.     Updated Vital Signs BP (!) 154/98 (BP Location: Right Arm)   Pulse 96   Temp 100.2 F (37.9 C)   Resp 20   SpO2 93%   Physical Exam Vitals and nursing note reviewed. Exam conducted with a chaperone present (ED RN [place]).  Constitutional:      Appearance: She is not ill-appearing or toxic-appearing.  HENT:     Head: Normocephalic and atraumatic.     Mouth/Throat:     Mouth: Mucous membranes are moist.     Pharynx: No oropharyngeal exudate or  posterior oropharyngeal erythema.  Eyes:     General:        Right eye: No discharge.        Left eye: No discharge.     Conjunctiva/sclera: Conjunctivae normal.  Cardiovascular:     Rate and Rhythm: Normal rate and regular rhythm.     Pulses: Normal pulses.     Heart sounds: Normal heart sounds. No murmur heard. Pulmonary:     Effort: Pulmonary effort is normal. No respiratory distress.     Breath sounds: Normal breath sounds. No wheezing or rales.  Abdominal:     General: A surgical scar is present. Bowel sounds are normal. There is no distension.     Palpations: Abdomen is soft.     Tenderness: There is no abdominal tenderness. There is no right CVA tenderness, left CVA tenderness, guarding or rebound.     Comments: Well-appearing surgical incisions from recent laparoscopic hernia repair  Genitourinary:    Comments: Kau stool present on the buttocks. DRE performed with finding of large moderately firm stool ball, palpable ~ 4 cm into the rectum. No palpable hemorrhoids on exam.  Musculoskeletal:        General: No deformity.     Cervical back: Neck supple.     Right lower leg: No edema.     Left lower leg: No edema.  Skin:    General: Skin is warm and dry.     Capillary Refill: Capillary refill takes less than 2 seconds.  Neurological:     General: No focal deficit present.     Mental Status: She is alert and oriented to person, place, and time. Mental status is at baseline.  Psychiatric:        Mood and Affect: Mood normal.    (all labs ordered are listed, but only abnormal results are displayed) Labs Reviewed  COMPREHENSIVE METABOLIC PANEL WITH GFR - Abnormal; Notable for the following components:      Result Value   Sodium 126 (*)    Chloride 93 (*)    Glucose, Bld 113 (*)    Calcium  8.8 (*)    Albumin 3.4 (*)    All other components within normal limits  CBC - Abnormal; Notable for the following components:   WBC 11.0 (*)    RBC 3.73 (*)    HCT 35.6 (*)     All other components within normal limits  LIPASE, BLOOD  URINALYSIS, ROUTINE W REFLEX MICROSCOPIC    EKG: None  Radiology: No results found.  {Document cardiac monitor, telemetry assessment procedure when appropriate:32947} Procedures   Medications Ordered in the ED - No data to display    {Click here for ABCD2, HEART and other calculators REFRESH Note before signing:1}  Medical Decision Making 73 y/o female who presents with rectal pain, constipation 3 dpo from lap ventral hernia repair with mesh.   HTN on intake VS otherwise normal, abdominal exam with generalized mild TTP without guarding or rebound. DRE as above.   DDX includes but is not limited to ileus, bowel obstruction, fecal impaction, post-op infection.   Amount and/or Complexity of Data Reviewed Labs: ordered.    Details: CBC with mild leukocytosis of 11,000, CMP with hyponatremia of 126, normal renal and hepatic function.  Lipase is normal.   Radiology: ordered.  Risk Prescription drug management.   ***  {Document critical care time when appropriate  Document review of labs and clinical decision tools ie CHADS2VASC2, etc  Document your independent review of radiology images and any outside records  Document your discussion with family members, caretakers and with consultants  Document social determinants of health affecting pt's care  Document your decision making why or why not admission, treatments were needed:32947:::1}   Final diagnoses:  None    ED Discharge Orders     None

## 2024-06-26 NOTE — ED Triage Notes (Signed)
 Pt c/o lower abd pain and feeling constipated. Recently had hernia repair on Monday. Not on opioids for pain. Last BM approximately one week ago. Pt also c/o nausea without vomiting.

## 2024-06-26 NOTE — Discharge Summary (Signed)
 Physician Discharge Summary  Patient ID: Andrea [name] MRN: [id number] DOB/AGE: 71/05/1951 71 y.o.  Admit date: 06/23/2024 Discharge date: 06/26/2024  Admission Diagnoses:  Discharge Diagnoses:  Principal Problem:   Ventral hernia without obstruction or gangrene   Discharged Condition: good  Hospital Course: the pt underwent lap assisted ventral hernia repair with mesh. On pod 1 she had an ileus. This resolved quickly and on pod 2 she was ready for d/c home  Consults: None  Significant Diagnostic Studies: none  Treatments: surgery: as above  Discharge Exam: Blood pressure (!) 142/87, pulse 80, temperature 97.8 F (36.6 C), resp. rate 18, height 5' 2 (1.575 m), weight 61.7 kg, SpO2 93%. GI: soft with mild tenderness. Good bs  Disposition: Discharge disposition: 01-Home or Self Care       Discharge Instructions     Call MD for:  difficulty breathing, headache or visual disturbances   Complete by: As directed    Call MD for:  extreme fatigue   Complete by: As directed    Call MD for:  hives   Complete by: As directed    Call MD for:  persistant dizziness or light-headedness   Complete by: As directed    Call MD for:  persistant nausea and vomiting   Complete by: As directed    Call MD for:  redness, tenderness, or signs of infection (pain, swelling, redness, odor or green/yellow discharge around incision site)   Complete by: As directed    Call MD for:  severe uncontrolled pain   Complete by: As directed    Call MD for:  temperature >100.4   Complete by: As directed    Diet - low sodium heart healthy   Complete by: As directed    Discharge instructions   Complete by: As directed    May shower. Diet as tolerated. Do not lift more than 10 lbs for the next 6 weeks. Wear binder if comfortable   Increase activity slowly   Complete by: As directed    No wound care   Complete by: As directed       Allergies as of 06/25/2024       Reactions    Hydrocodone-acetaminophen  Other (See Comments)   Passes out   Morphine  Other (See Comments)   Passes out        Medication List     TAKE these medications    ALPRAZolam  0.5 MG tablet Commonly known as: XANAX  Take 1 tablet (0.5 mg total) by mouth 2 (two) times daily as needed for anxiety. What changed: when to take this   atorvastatin  20 MG tablet Commonly known as: LIPITOR Take 20 mg by mouth daily.   calcium  citrate-vitamin D  315-200 MG-UNIT tablet Commonly known as: CITRACAL+D Take 1 tablet by mouth daily.   celecoxib  400 MG capsule Commonly known as: CeleBREX  Take 1 capsule (400 mg total) by mouth daily after breakfast.   Deplin FC 15 MG Caps Take 15 mg by mouth daily at 12 noon.   fluticasone  50 MCG/ACT nasal spray Commonly known as: FLONASE  Place 2 sprays into both nostrils daily as needed for allergies.   levothyroxine  75 MCG tablet Commonly known as: SYNTHROID  Take 75 mcg by mouth daily before breakfast.   methylphenidate  27 MG CR tablet Commonly known as: Concerta  Take 1 tablet (27 mg total) by mouth every morning.   minoxidil 2.5 MG tablet Commonly known as: LONITEN Take 2.5 mg by mouth daily. Uses for hair loss   multivitamin with minerals  Tabs tablet Take 1 tablet by mouth daily.   OLANZapine  5 MG tablet Commonly known as: ZYPREXA  TAKE 1 TABLET BY MOUTH EVERYDAY AT BEDTIME What changed: See the new instructions.   ondansetron  4 MG disintegrating tablet Commonly known as: ZOFRAN -ODT Take 1 tablet (4 mg total) by mouth every 6 (six) hours as needed for nausea.   propranolol  10 MG tablet Commonly known as: INDERAL  Take 20 mg by mouth in the morning. Takes for tremors- hands   QUEtiapine  300 MG 24 hr tablet Commonly known as: SEROQUEL  XR TAKE 2 TABLETS (600 MG TOTAL) BY MOUTH AT BEDTIME. What changed:  when to take this additional instructions   raloxifene  60 MG tablet Commonly known as: EVISTA  Take 60 mg by mouth daily.   sertraline   100 MG tablet Commonly known as: ZOLOFT  Take 1.5 tablets (150 mg total) by mouth at bedtime. What changed: when to take this        Follow-up Information     Andrea Andrea III, MD Follow up in 3 week(s).   Specialty: General Surgery Contact information: [address] [place] [address] [place] [address] [phone]                 Signed: Mt Andrea [name] 06/26/2024, 10:39 AM

## 2024-06-26 NOTE — ED Triage Notes (Signed)
 Patient had surgery Monday and she has not had a bowel movement since last Friday 06/20/24. Family states she is constipated.

## 2024-06-27 ENCOUNTER — Encounter: Payer: Self-pay | Admitting: Psychiatry

## 2024-06-27 DIAGNOSIS — K6289 Other specified diseases of anus and rectum: Secondary | ICD-10-CM | POA: Diagnosis not present

## 2024-06-27 MED ORDER — ACETAMINOPHEN 500 MG PO TABS
1000.0000 mg | ORAL_TABLET | Freq: Once | ORAL | Status: AC
  Administered 2024-06-27: 1000 mg via ORAL
  Filled 2024-06-27: qty 2

## 2024-06-27 MED ORDER — LIDOCAINE HCL URETHRAL/MUCOSAL 2 % EX GEL
1.0000 | Freq: Once | CUTANEOUS | Status: AC
  Administered 2024-06-27: 1 via TOPICAL
  Filled 2024-06-27: qty 11

## 2024-06-27 MED ORDER — FLEET ENEMA RE ENEM
1.0000 | ENEMA | Freq: Once | RECTAL | Status: AC
  Administered 2024-06-27: 1 via RECTAL
  Filled 2024-06-27: qty 1

## 2024-06-27 NOTE — Discharge Instructions (Addendum)
 You were seen in the ER for your constipation and rectal pain. Fortunately, your stool impaction was relieved with interventions in the ER. Please take daily stool softeners; you may use miralax or colace. Titrate to achieve at least one soft, easy-to-pass bowel movement daily. Follow up with your surgeon as previously scheduled and return to the ER if you develop any fever, chills, worsening abdominal pian, or any other new severe symptoms.

## 2024-06-27 NOTE — Telephone Encounter (Signed)
 Pt Lvm @ 2:15p stating she wanted to leave a message for Dawna that she had surgery but she had to get back on all of her medicines.     Next appt 10/10

## 2024-06-27 NOTE — ED Notes (Signed)
 Patient able to have a bowel movement after Fleet enema administered.

## 2024-07-01 ENCOUNTER — Telehealth: Payer: Self-pay | Admitting: Psychiatry

## 2024-07-01 ENCOUNTER — Other Ambulatory Visit: Payer: Self-pay

## 2024-07-01 DIAGNOSIS — F339 Major depressive disorder, recurrent, unspecified: Secondary | ICD-10-CM

## 2024-07-01 DIAGNOSIS — F5105 Insomnia due to other mental disorder: Secondary | ICD-10-CM

## 2024-07-01 MED ORDER — ALPRAZOLAM 0.5 MG PO TABS: 0.5000 mg | ORAL_TABLET | Freq: Every evening | ORAL | 0 refills | Status: DC | PRN

## 2024-07-01 NOTE — Telephone Encounter (Signed)
 Pended alprazolam . LF Concerta  9/12, due 10/10.

## 2024-07-01 NOTE — Telephone Encounter (Signed)
 Pt called and said that she needs refills on her xanax  and concerta . She also would like bridget to call her as well

## 2024-07-03 ENCOUNTER — Other Ambulatory Visit: Payer: Self-pay

## 2024-07-03 DIAGNOSIS — F339 Major depressive disorder, recurrent, unspecified: Secondary | ICD-10-CM

## 2024-07-03 NOTE — Telephone Encounter (Signed)
 Pt called requesting Concerta  Rx to [facility] [place]. Not on on file

## 2024-07-03 NOTE — Telephone Encounter (Signed)
 Pended

## 2024-07-04 ENCOUNTER — Encounter: Payer: Self-pay | Admitting: Psychiatry

## 2024-07-04 ENCOUNTER — Ambulatory Visit (INDEPENDENT_AMBULATORY_CARE_PROVIDER_SITE_OTHER): Admitting: Psychiatry

## 2024-07-04 DIAGNOSIS — F339 Major depressive disorder, recurrent, unspecified: Secondary | ICD-10-CM | POA: Diagnosis not present

## 2024-07-04 DIAGNOSIS — F99 Mental disorder, not otherwise specified: Secondary | ICD-10-CM | POA: Diagnosis not present

## 2024-07-04 DIAGNOSIS — R251 Tremor, unspecified: Secondary | ICD-10-CM

## 2024-07-04 DIAGNOSIS — F411 Generalized anxiety disorder: Secondary | ICD-10-CM | POA: Diagnosis not present

## 2024-07-04 DIAGNOSIS — F5105 Insomnia due to other mental disorder: Secondary | ICD-10-CM

## 2024-07-04 MED ORDER — METHYLPHENIDATE HCL ER (OSM) 27 MG PO TBCR: 27.0000 mg | EXTENDED_RELEASE_TABLET | ORAL | 0 refills | Status: DC

## 2024-07-04 NOTE — Progress Notes (Signed)
 Andrea [name] [id number] 1951/04/23 73 y.o.  Virtual Visit via Telephone Note  I connected with pt by telephone and verified that I am speaking with the correct person using two identifiers.   I discussed the limitations, risks, security and privacy concerns of performing an evaluation and management service by telephone and the availability of in person appointments. I also discussed with the patient that there may be a patient responsible charge related to this service. The patient expressed understanding and agreed to proceed.  I discussed the assessment and treatment plan with the patient. The patient was provided an opportunity to ask questions and all were answered. The patient agreed with the plan and demonstrated an understanding of the instructions.   The patient was advised to call back or seek an in-person evaluation if the symptoms worsen or if the condition fails to improve as anticipated.  I provided 30  minutes of non-face-to-face time during this encounter. The call started at 200 and ended at 230. The patient was located in car as passenger and the provider was located office.   Subjective:   Patient ID:  Andrea [name] is a 73 y.o. (DOB [birth year]) female.  Chief Complaint:  Chief Complaint  Patient presents with   Follow-up   Depression   Anxiety   Fatigue   Medication Reaction    HPI Andrea [name] presents to the office today for follow-up of MDE and anxiety disorder.  seen 09/2019.  No meds were changed.  Been on Lexapro  20 since early 2017.  02/27/2020 phone call from patient: Pt experiencing more anxiety than she normally has. Pt would like to know what are some options that she has as far as her medication. Pt will be in an appt  MD response: If the anxiety is just occasional then using alprazolam  as needed would be an okay thing to do.  If it is been fairly persistent for a couple of weeks or more than the simplest solution would be to increase the Lexapro  to 1-1/2  tablets daily.  Historically she has done well on Lexapro  20 mg daily for an extended period of time.  If she is under temporary stress then once that is resolved we could drop it back to 1 daily.  If this does not work as expected then she should schedule an earlier appointment. Pt response: Patient called back and she's been having temporary stress, but it's not bad. Things are great, she's working out and feeling okay for the most part. It's been going on for a bit so she agrees to the increase Lexapro  20 mg 1.5 tablets daily. She has been taking alprazolam  at hs occasionally to help sleep better and it does help.   12/06/2020 appointment with the following noted:  No covid.  H Covid and recovered.   Had increased Lexapro  to 30 mg for 90 days and saw a difference but started seeing weight gain and was doing oK and able to drop back and been OK. Still renovating a house for a year.  Sold her house and mourns the house. Calmed down.  Kept 3 gkids and dogs 10 days.   Andrea [name] for new house May 1. Rare alprazolam  for HS. Stress moving to condo and would have crying spell over the move and some problems she was running into dealing with it.  Hard with change.  But taking time to adjust to the idea of moving from her house of 23 years.  Initially refused but  he left it up to her and she's come to peace about it.  Andrea [name].   Good response to medication and no SE.  Exercising is good medicine.  If doesn't then doesn't feel as good. Satisfied. Plan: no med changes  08/01/21  appt noted:  seen with [name] died 2024-08-01.  A lot of ups and downs per D.  Hard dips. Got worse when retired and dealing with the new  house.  Had trouble getting rid of things from the old house causing regrets over sellling the house.  D notes doesn't do well with instability  and stressors.  Ruminates on past mistakes. Blamed [name] for the  difficulty renovating the house.  Neg self talk and beats herself up. When  not enough sleep will get depressed.  Does better if busy.   Dreads things.  Patient denies difficulty with sleep initiation or maintenance. Denies appetite disturbance.  Patient reports that energy and motivation have been good. Patient denies any difficulty with concentration.  Patient denies any suicidal ideation.  Plan: Abilify  2.5 mg daily for a week, then 5 mg daily. Continue Lexapro  20 mg daily  08/31/2021 appt noted: It worked immediately.  Insomnia with EMA. Normally 9 hours.  It's like I'm high.  Not tired.  Average 3-4 hours. Not depressed and no crazy negative thoughts.  Good response.  Energetic but up in middle of night. Plan: Okay to stop Abilify  because of insomnia.   09/29/2021 appointment with the following noted: Parties were great.  Sleep problem resolved.  A lot of rest at the beach. Maybe the last week or 2 less motivation and socialization.  Not laying in the bed.  Just not as good as she was.  Not as outgoing as normal. No alcohol now.  Only had 1-2 at night.  Not ruminating.   Willing to restart Abilify  at lower dose 2 mg daily. 3 grandsons  Plan: Relapse depression recurs she can resume Abilify   but lower dose 2 mg daily or every other day to try to avoid the insomnia where she can contact our office and we can discussed the possibility of an alternative antidepressant such as Trintellix  or duloxetine .  12/28/2021 appointment noted: Several phone calls since she was here.  Abilify  plus Lexapro  failed.  Switch to duloxetine  90 mg which she started 12/02/2018 2023 Started lamotrigine . Also started Rexulti  Sister has had cycling depression that has responded well to lamotrigine  with poor response to SSRIs [name] with a little dementia. SE tremor 90 mg duloxetine , ? Wt gain over a few weeks.. Exercise and wt control Social isolation, low self esteem, worry about the way she looks and not usually that way. Crying better with duloxetine .  More dependent on H.   Anhedonia.   Black hole.  Never this low. No SI Having to use Xanax .   Asks about day treatment and TMS Plan: rec TMS Continue lamotrigine  as prescribed Increase Rexulti  to 1 mg daily Switch to Trintellix :  reduce duloxetine  to 2 of the 30 mg capsules and start Trintellix  5 mg daily for 5 days,  Then Increase Trintellix  to 10 mg daily and reduce duloxetine  to 1 capsule for 1 week. Then stop duloxetine .  02/15/22 appt noted: On Trintellix  , Rexulti  1 and lamotrigine  I feel good.  Walks 3 miles daily is great for mental health. TMS would not be covered by [facility]. Started feeling better after a week or 2 after the last visit. SE appetite increased. No nausea.  Not crying anymore. Sleeping well and no longer ruminates. Plan: Continue Trintellix  but DC Rexulti  due to weight gain.  Continue lamotrigine   03/06/2022 appointment with the following noted: Last 2 weeks has been very sad and nervous. Trintellix  was increased to 20 mg daily  03/10/2022 complaining of ongoing depression, feeling jittery, negative thinking, early morning awakening, ruminating about past decisions.  Wanting to consider TMS because she is desperate for improvement. Plan we will schedule urgent appointment  03/15/22 urgent appt noted:  seen with Andrea [name] Very depressed, worst ever.  Don't want to be alone, ongoing worry, ruminating on past decisions.  Racing negative thoughts.  No motivation.  Trouble staying asleep.  Getting 4 hours and used to 9 hours. Dread getting up. Using Xanax  Increased Trintellix  to 20 mg 9 days ago. H agrees sleep is critical. Isolating.   Need to be better by July 11. Plan: Clonazepam  1 mg HS. Continue trintellix  20 mg daily she is only been on this dose 9 days and it needs more time. Re lamotrigine  and increase to 150 mg daily..    Vraylar  1.5 mg every other day Option TMS  03/24/22 appt noted: So much better.  Thinking straighter and not negative and not sad.  Sleeping better 8-9 hours.. No crying. H  notices progressively better every day.  Mind clarity is much better.   No SE noted.  No hangover.  No nausea. Found a therapist, Heather Jungling.   Feels well enough to go to [place] and kind of excited.  Back July 16.   Plan: Clonazepam  1 mg HS. This resolved insomnia without SE Continue trintellix  20 mg daily she is only been on this dose 20 days and it needs more time. Re lamotriginecontinue 150 mg daily..    Vraylar  1.5 mg every other day Option TMS  04/12/22 appt noted: Great.  Went to [place].  Was herself and enjoyed it.  Anxiety was managed.   [facility] and liked her. 3# wt gain.   Patient reports stable mood and denies depressed or irritable moods.  Patient denies any recent difficulty with anxiety.  Patient denies difficulty with sleep initiation or maintenance. Denies appetite disturbance.  Patient reports that energy and motivation have been good.  Patient denies any difficulty with concentration.  Patient denies any suicidal ideation. Sleeping well than not anxious. Plan: Trintellix  20  07/10/22 TC depression returned recently.  08/15/22 appt noted: Still blunted  and diminished motivation.  Some days better than others.  Going to gym.  Partially better.   Still has a lot of anxiety and doesn't want to be alone. Functioning but not normal.  Dreads parties.   SE tremor for a couple of week.sleep great.  Dep 7/10. Current Vraylar  1.5 mg daily, increase lamotrigine  200 mg daily, Trintellix  20 , clonazepam  0.5 mg HS Plan: Reduce Clonazepam  1/2  mg HS. This resolved insomnia without SE Continue trintellix  20 mg daily only mildly effective. lamotrigine  continue 200 mg daily..    DC Vraylar  DT lost response Auvelity  Stop Vraylar  After Thanksgiving stop Trintellix  Wait 3 days then Start Auvelity  1 in the morning for 1 week,  and if no side effects then increase Auvelity  to 1 in the AM and 1 in the PM  09/04/22 TC: Complaining of persistent depression and  wanting to do something else to help.  Added lithium  CR 300 mg nightly  09/11/2022 phone call complaining of no improvement with Auvelity  and lithium .  Appointment was moved up.  09/13/22  appt noted: Nothing changed. No better and no worse.  Awakens sad and tearful.  Sleeps well. Pushing herself to the gym.  Has a trainer. On clonazepam  0.5 mg HS She remains anxious when alone for no apparent reason.  Feelings of inferiority.  She is normally extroverted but now she is wanting to isolate from people.  All tasks seem monumental.  No enjoyment and not looking forward to anything.  Everything is a Personal assistant.  She remains generally worried and anxious which is not typical for her. Plan: Clonazepam  1/2  mg HS. This resolved insomnia without SE Stop Auvelity  Start nortriptyline  1 of the 25 mg capsules at night for 4 nights then 2 at night for 4 nights then 3 at night, then wait 1 week and get the blood test in the morning at [facility] Reduce lamotrigine  to 1 and 1/2 tablets daily  Lamotrigine  has not been helpful to him 100 mg daily.  Start reducing lamotrigine  150 mg daily and will continue to taper at next appointment.  09/27/21 urgent appt :  she wanted urgent appt bc desperate to feel better.  Seen with H Called at least twice since here for the same reasons of depression. Not sleeping well even with clonazepam  1 mg HS. Doesn't want to be alone. Shakey.   On lithium  300 mg daily, nortriptyline  75 mg HS.   Doesn't want to live like this but not acutely suicidal. Tolerating meds. Plan: pursue [facility] protocol TMS at [facility] bc faster optin than alternatives  10/30/22 TC: finished 50 TMS tx in the week and wants appt ASAP bc still struggling with crying and depresssion..  11/03/22 urgent appt : Completed TMS last week. Tue-Thur better days and then Friday nose-dived. Not done well since got back.   H notes high anxiety and crying in AM and PM and doesn't want to be alone.  Xanxax seems to help. Xanax   helps and taking 0.5 mg AM and 1 mg HS H notes memory is sharper and she's more alert after treatment. Very anxioius all the time.  Get up sad.  Exercising. Sleep good with Xanax  1 mg HS. Plan: Increase Lexapro  20 mg dailly (should help crying and maybe anxiety but unlikely to resolve depression) Increase alprazolam  to 0.5 mg TID and 1 mg HS for severe anxiety. H notes it helps. Pramipexole  0.25 BID for 5 days and if NR then 0.5 mg Bid off label. Reduce lamotrigine  100 mg for 2 weeks then 50 mg daily for 2 weeks then stop it. She wants to pursue Spravato  if pramipexole  fails  11/17/22 emergency work in appt: Pramipexole  was not sent in and MD not aware until yesterday.  She is not any better and is desperate for a med change. Sleeping well and taking Xanax  1 mg HS Mornings are worse.  Not crying much.  Last Friday was a bad day and she didn't want to be alone.  But did let her H play golf once this week. Reduced lamotrigine  today to 50 BID and weaning off. H says better this week than last week. Increased Lexapro  to 20 mg daily. Plan: Increased Lexapro  20 mg dailly (helped crying and maybe anxiety but unlikely to resolve depression) Continue alprazolam  to 0.5 mg TID prn anxiety and 1 mg HS. H notes it helps. Pramipexole  0.25 mg BID for 3 days then 0.5 mg BID Reduce lamotrigine  50 mg daily for 2 weeks then stop it. She wants to pursue Spravato  if pramipexole  fails  12/25/22 appt urgently. COMPLETED 1  WEEK TMS [facility] without benefit except TMS took away brain fog.   She feels need to take Xanax  0.5 mg TID  Some sleepiness with Xanax  . Mornings are better and fine while at the gym but when home gets depressed again.   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 Needs Xanax  to do social things. Plan: Increased Lexapro  20 mg dailly (helped crying and maybe anxiety but unlikely to resolve depression) Continue alprazolam  to 0.5 mg TID prn anxiety and 1 mg HS. H notes it helps. Change pramipexole  0.5mg   tablets, 1 tablet four times daily for 1 week,  Then if not better 1and 1/2 tablets in the AM and dinner and 1 tablet at lunch and dinner.    4/9-4/15 hosp for ruptured appendix and missed meds: Can restart Lexapro  at 1/2 tablet daily for 3 days then 1 daily. Restart 1/2 pramipexole  tablet 3 times daily for 3 days then 1 tablet 3 times daily for 3 days then 1 and 1/2 tablet 3 times daily.      01/12/23 appt noted: No GI sx now.  No appetite but working on protein.   I think I'm doing real well mentally.  Has resumed meds.  Mood has been great. Pramipexole  0.5mg   1 and 1/2 TID Lexapro  20 Xanax  reduced to 1 mg HS H sees a difference.   No SE with meds.   No med changes  01/25/23 appt noted: Meds:  pramipexole  0.75 mg TID, Xanax  1.5 mg HS and 0.5 mg prn, Lexapro  20 mg daily. Doing great.  Staples removed and healing from surgery appendectomy. Back in the gym helps her.   Satisfied with meds. Getting out socially and feeling like her old self.  Chelsea's H Brad went to ER vomiting blood.   Plan: no med changes  02/07/23 appt noted: Back from [facility] yesterday.  Had ear infection.  Then conjunctivitis.  Eyes hurt.   Before the surgery was getting better and feels like she is sliding back some.  Gradually a little worse since here.  Wonders if she could get better with joy and social drive.   Off Abx .  Sleep is good with meds.  With depression then gets anxious too.  Is functioning.   Meds:  pramipexole  0.75 mg TID, Xanax  1.5 mg HS and 0.5 mg prn, Lexapro  20 mg daily. Consistent and no SE. Plan: Continue Lexapro  20 mg dailly (helped crying and maybe anxiety but unlikely to resolve depression) Continue alprazolam   1 mg HS. H notes it helps. Increase pramipexole  0.5mg  tablets, 2 tablets 3 times daily  01/31/23 TC:Con Andrea [name], CMA  to Me     02/20/23  4:56 PM Note Patient reporting she is not feeling as good as she thinks she should. Her anxiety is the biggest concern. She said she can  go to the gym in the mornings and does ok, but she had a closing to go to recently and had to take 1/4 of a Xanax , reports it took the edge off. She reports daily tremors that are helped by Xanax . She is not crying, she is getting things done, sleeping okay. Will have her grandchildren next week and she isn't excited about it like she should be.    Taking 6 mirapex , 1.5 Xanax  and Lexapro  20 mg.     MD resp:  CC   02/20/23  5:48 PM Note We just increased the pramipexole  recently and I see her next week.  I do not want to increase the medicine any  further until I see her again.      03/02/23 urgent appt noted: Some improvement with incr pramipexole  1 mg TID. Before felt worse in AM now feels better in AM and goes to gym. Otherwise no excitement.  Hard to make decisions. Less social and smiling.  Subdued more than normal.  Not confident.  Not enjoying present and ruminates in past.   No SE.  No impulsivity Has tremor for awhile .  Is mild for a month. Hard to conc.  Brain fog was better with TMS but is back. Continue Lexapro  20 mg dailly (helped crying and maybe anxiety but unlikely to resolve depression) Continue alprazolam   1 mg HS. H notes it helps. Increase pramipexole  1 mg QID times daily  03/16/23 appt noted: Meds as above SE: If not enough food makes her excitable, urgent, talking fast.  Does it a bit too much.  Energy fine.   Feels better in the AM at the gym and when home a dark shadow comes over her.   Still feels dep overall.  Went to work with friends and didn't feel natural and didn't enjoy it like she should.  Pushes herself to stay active.   Takes 1.5 mg Xanax  HS and then awakens and takes another 0.5 mg HS but gets enough sleep. A lot of brain fog.  Feels inferior and doesn't usually feel that way. Infrequent crying spells. Plan:  Continue Lexapro  20 mg dailly (helped crying and maybe anxiety but unlikely to resolve depression) Continue alprazolam   1 mg HS. H notes it  helps. Reduce pramipexole  1 mg TID for 3 days, then 0.5 mg QID for 3 days, then 0.5 mg BID for 3 days, then 0.25 mg BID for 3 days, then stop it. Now start Latuda  (lurasidone ) 40 mg 1/2 tablet for 1 week.  If not benefit then 1 tablet daily with 350 calories  04/13/23 urgent appt note: with [facility] 04/05/23 for dep with SI Worst I've ever been.  Feels out of control.  Shaking .  Can't sleep.  Feels disconnected.   Hated being hosp bc locked up.   Taking quetiapine  100 hS and lorazepam and still can't sleep. Started sertraline  50 and stopped Lexapro . Quetiapine  100 mg HS Plan: Stop hydroxyzine Increase quetiapine  200 then 300 mg at night for sleep and depression  Stop lorazepam and return to alprazolam  1 mg at night for sleep Increase sertraline  to 100 mg daily or 2 of 50 mg nightly  04/23/23 appt noted: Here for first appt with Spravato .  Highly anxious about getting Spravato  today.  Doesn't like feeling out of control and worries over this. Still very depressed.  Doesn't think her sx as noted are due to depression.  Thinks she has a neuro problem.  But she has no other neuro sx like numbness, weakness, balance px, or anything other than dep and anxiety sx. Received Spravato  56 mg today.  It was horrible! Bc she felt out of control and was anxious receiving it.  Thought it would last forever re: dissociation.  It was scary to her.  Couldn't relax for it.  No N, V, HA.  Ambivalent about continuing Spravato  but open. No specific concerns with meds.    04/25/23 appt noted: Needed Xanax  before visit today to overcome fear of coming to the appt. She was more disciplined about controlling neg thought during Spravato  admin and had a much better experience.  It was not scary nor associated with sig fear.  She is still  dep and anxious without change otherwise since seen a couple of days ago. Tolerating med changes from last week.   Received Spravato  56 mg again and tolerated it without adverse SE.   Typical SE dissociation, no NV, palpitations, HA.  Motivated to continue the Spravato  and agrees to increase it.  Better experience than the first time. Plan: Increase quetiapine  to 400 mg HS  04/26/23 TC:  Patient taking 300 mg of Seroquel  and 1 mg of Xanax  for sleep. She can get to sleep, just can't stay asleep. Doesn't say how long she is sleeping, but says she needs 9 hours of sleep. Reporting social isolation is bad, she won't leave the house, but doesn't like being home alone.  Reports she has never been this sick mentally and feels like she may need to go to the hospital. No SI, just doesn't like being alone.     MD resp:  Pt just seen yesterday and received 2nd Spravato  56 mg .  That was positive experience. However she is still severely depressed and ruminating and reassurance seeking..  She doesn't need to go to the hospital. She told me yesterday she slept 7 hours but needs 9 hours.  7 hours is not bad.   She is taking quetiapne 300 mg HS with Xanax  1 mg HS.  No increase in Xanax  but can increase quetiapine  to 400 mg HS (max dose 800) and this could help sleep and anxiety and depression. She should keep appt next week for next Spravato .  In case she asks, Spravato  is not making her worse in any way.     05/01/23 appt noted;H present also Current meds; quetiapine  400 mg HS, sertraline  100 mg daily, alprazolam  0.5 mg TID and 1 mg HS. Tolerating med changes.    Received Spravato  84 mg first time.  Typical SE dissociation, no NV, palpitations, HA.  However she had a bad experience DT severe anxiety before starting the Spravato .   Very fearful, scared.  Without specific reasons.  Severe anxiety dep, hopeless.  No SI but doesn't want to live like this.  So anxious hard to sleep and some crying spells. Seroquel  not helping sleep.  05/03/23 appt noted: Current meds; quetiapine  400 mg HS, sertraline  100 mg daily, alprazolam  0.5 mg TID and 1 mg HS. Tolerating med changes.    Received Spravato  84  mg.  Typical SE dissociation, no NV, palpitations, HA.  However she had a bad experience DT severe anxiety before starting the Spravato .   Highly anxious, fearful of everything including Spravato  admin.  Afraid she will forget who she is despite having it before.  Still anxious and trouble sleeping at home.  Sleep no better with quetiapine  and still ruminatiing.   Plan increase quetiapine  to 500 mg HS  05/08/23 appt noted: Meds: quetiapine  500 mg HS, sertraline  100 mg daily, alprazolam  0.5 mg TID and 1 mg HS. Received Spravato  84 mg 3rd time.  Typical SE dissociation, no NV, palpitations, HA.  However she had a bad experience DT severe anxiety before starting the Spravato .   She is not better so far.  Ongoing severe depression anxiety, avoidance, anhedonia, rumination, afraid to be alone. All not typical of herself.   No SE with meds except constipation and sweating at night.  Plan:  Increase quetiapine  to 600 mg at night for sleep and depression & rumination and next week to 600 HS increased alprazolam  1 mg at night for sleep and 0.5 mg TID Increase sertraline  150 mg daily  Spravato  84 mg AM twice weekly for severe TRD  05/10/23 appt noted:  Meds: quetiapine  600 mg HS, sertraline  150 mg daily, alprazolam  0.5 mg TID and 1 mg HS. Received Spravato  84 mg 4th time.  Typical SE dissociation, no NV, palpitations, HA.  However she had a bad experience DT severe anxiety before starting the Spravato .   She is not better so far.  Ongoing severe depression anxiety, avoidance, anhedonia, rumination, afraid to be alone. All not typical of herself.   SE constipation she finds unmanageable  Did sleep better with increased quetiapine  600 mg Hs but cannot continue dT constipation.  However dep and anxiety are no better with Spravato  or the other treatments except better sleep.  Anxiety ongoing severe with rumination.  05/17/23 appt noted:  Meds: switched to olanzapine  15 mg HS, sertraline  150, alprazolam    Tremor seems worse but H disagrees. Mood continually worse.  She is dependent on H and afraid to be alone.  No sweating. Xanax  helped but wears off after a couple of hours.  She is open to antoher option Tolerating med changes. No improvement except slept a little better last night but still not enough. No Spravato  today bc 2 weeks of it didn't help.  Agrees to pursue ECT but hoping med changes will help Crying spells.  07/10/23 appt noted: Great.  Andrea [name] back at work.  Playing golf again.   Med: sertraline  200, propranolol  10 BID, olanzapine  15 pm, clonazepam  1 mg HS Did not require ECT.  All of a sudden something clicked and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

## 2024-07-04 NOTE — Patient Instructions (Addendum)
 Plan:  Reduce sertraline  to 1 tablet daily.   Reduce quetiapine  to 1 and 1/2 tablets at night for 1 week then reduce to 1 tablets. 3-7 days after surgery reduce sertraline  again to 1/2 tablet for 7 days then stop it.   Wait 10 days and call to begin the [facility].

## 2024-07-10 DIAGNOSIS — E785 Hyperlipidemia, unspecified: Secondary | ICD-10-CM | POA: Diagnosis not present

## 2024-07-10 DIAGNOSIS — F411 Generalized anxiety disorder: Secondary | ICD-10-CM | POA: Diagnosis not present

## 2024-07-10 DIAGNOSIS — Z780 Asymptomatic menopausal state: Secondary | ICD-10-CM | POA: Diagnosis not present

## 2024-07-10 DIAGNOSIS — K432 Incisional hernia without obstruction or gangrene: Secondary | ICD-10-CM | POA: Diagnosis not present

## 2024-07-10 DIAGNOSIS — R799 Abnormal finding of blood chemistry, unspecified: Secondary | ICD-10-CM | POA: Diagnosis not present

## 2024-07-10 DIAGNOSIS — E039 Hypothyroidism, unspecified: Secondary | ICD-10-CM | POA: Diagnosis not present

## 2024-07-10 DIAGNOSIS — R5382 Chronic fatigue, unspecified: Secondary | ICD-10-CM | POA: Diagnosis not present

## 2024-07-10 DIAGNOSIS — M8580 Other specified disorders of bone density and structure, unspecified site: Secondary | ICD-10-CM | POA: Diagnosis not present

## 2024-07-10 DIAGNOSIS — R9089 Other abnormal findings on diagnostic imaging of central nervous system: Secondary | ICD-10-CM | POA: Diagnosis not present

## 2024-07-10 DIAGNOSIS — Z7689 Persons encountering health services in other specified circumstances: Secondary | ICD-10-CM | POA: Diagnosis not present

## 2024-07-10 DIAGNOSIS — F331 Major depressive disorder, recurrent, moderate: Secondary | ICD-10-CM | POA: Diagnosis not present

## 2024-07-10 DIAGNOSIS — Z1331 Encounter for screening for depression: Secondary | ICD-10-CM | POA: Diagnosis not present

## 2024-07-15 ENCOUNTER — Other Ambulatory Visit: Payer: Self-pay | Admitting: Psychiatry

## 2024-07-15 DIAGNOSIS — F339 Major depressive disorder, recurrent, unspecified: Secondary | ICD-10-CM

## 2024-07-15 NOTE — Telephone Encounter (Signed)
 DC sertraline  bc switching to MAOI

## 2024-07-22 ENCOUNTER — Ambulatory Visit: Admitting: Psychiatry

## 2024-07-22 ENCOUNTER — Encounter: Payer: Self-pay | Admitting: Psychiatry

## 2024-07-22 DIAGNOSIS — F339 Major depressive disorder, recurrent, unspecified: Secondary | ICD-10-CM | POA: Diagnosis not present

## 2024-07-22 DIAGNOSIS — F411 Generalized anxiety disorder: Secondary | ICD-10-CM

## 2024-07-22 DIAGNOSIS — F5105 Insomnia due to other mental disorder: Secondary | ICD-10-CM | POA: Diagnosis not present

## 2024-07-22 DIAGNOSIS — Z780 Asymptomatic menopausal state: Secondary | ICD-10-CM | POA: Diagnosis not present

## 2024-07-22 DIAGNOSIS — R251 Tremor, unspecified: Secondary | ICD-10-CM

## 2024-07-22 NOTE — Progress Notes (Signed)
 Andrea [name] [id number] 03/04/1951 73 y.o.  Virtual Visit via Telephone Note  I connected with pt by telephone and verified that I am speaking with the correct person using two identifiers.   I discussed the limitations, risks, security and privacy concerns of performing an evaluation and management service by telephone and the availability of in person appointments. I also discussed with the patient that there may be a patient responsible charge related to this service. The patient expressed understanding and agreed to proceed.  I discussed the assessment and treatment plan with the patient. The patient was provided an opportunity to ask questions and all were answered. The patient agreed with the plan and demonstrated an understanding of the instructions.   The patient was advised to call back or seek an in-person evaluation if the symptoms worsen or if the condition fails to improve as anticipated.  I provided 30  minutes of non-face-to-face time during this encounter. The call started at 430 and ended at 500. The patient was located in car as passenger and the provider was located office.   Subjective:   Patient ID:  Andrea [name] is a 73 y.o. (DOB [birth year]) female.  Chief Complaint:  Chief Complaint  Patient presents with   Follow-up   Depression   Anxiety   Medication Reaction    HPI Andrea [name] presents to the office today for follow-up of MDE and anxiety disorder.  seen 09/2019.  No meds were changed.  Been on Lexapro  20 since early 2017.  02/27/2020 phone call from patient: Pt experiencing more anxiety than she normally has. Pt would like to know what are some options that she has as far as her medication. Pt will be in an appt  Andrea [name] response: If the anxiety is just occasional then using alprazolam  as needed would be an okay thing to do.  If it is been fairly persistent for a couple of weeks or more than the simplest solution would be to increase the Lexapro  to 1-1/2 tablets daily.   Historically she has done well on Lexapro  20 mg daily for an extended period of time.  If she is under temporary stress then once that is resolved we could drop it back to 1 daily.  If this does not work as expected then she should schedule an earlier appointment. Pt response: Patient called back and she's been having temporary stress, but it's not bad. Things are great, she's working out and feeling okay for the most part. It's been going on for a bit so she agrees to the increase Lexapro  20 mg 1.5 tablets daily. She has been taking alprazolam  at hs occasionally to help sleep better and it does help.   12/06/2020 appointment with the following noted:  No covid.  H Covid and recovered.   Had increased Lexapro  to 30 mg for 90 days and saw a difference but started seeing weight gain and was doing oK and able to drop back and been OK. Still renovating a house for a year.  Sold her house and mourns the house. Calmed down.  Kept 3 gkids and dogs 10 days.   Andrea [name] Hopes for new house May 1. Rare alprazolam  for HS. Stress moving to condo and would have crying spell over the move and some problems she was running into dealing with it.  Hard with change.  But taking time to adjust to the idea of moving from her house of 23 years.  Initially refused but he left it  up to her and she's come to peace about it.  Andrea [name].   Good response to medication and no SE.  Exercising is good medicine.  If doesn't then doesn't feel as good. Satisfied. Plan: no med changes  08/01/21  appt noted:  seen with D Andrea [name] died 07/25/24.  A lot of ups and downs per D.  Hard dips. Got worse when retired and dealing with the new  house.  Had trouble getting rid of things from the old house causing regrets over sellling the house.  D notes doesn't do well with instability  and stressors.  Ruminates on past mistakes. Blamed [name] for the  difficulty renovating the house.  Neg self talk and beats herself up. When not enough  sleep will get depressed.  Does better if busy.   Dreads things.  Patient denies difficulty with sleep initiation or maintenance. Denies appetite disturbance.  Patient reports that energy and motivation have been good. Patient denies any difficulty with concentration.  Patient denies any suicidal ideation.  Plan: Abilify  2.5 mg daily for a week, then 5 mg daily. Continue Lexapro  20 mg daily  08/31/2021 appt noted: It worked immediately.  Insomnia with EMA. Normally 9 hours.  It's like I'm high.  Not tired.  Average 3-4 hours. Not depressed and no crazy negative thoughts.  Good response.  Energetic but up in middle of night. Plan: Okay to stop Abilify  because of insomnia.   09/29/2021 appointment with the following noted: Parties were great.  Sleep problem resolved.  A lot of rest at the beach. Maybe the last week or 2 less motivation and socialization.  Not laying in the bed.  Just not as good as she was.  Not as outgoing as normal. No alcohol now.  Only had 1-2 at night.  Not ruminating.   Willing to restart Abilify  at lower dose 2 mg daily. 3 grandsons  Plan: Relapse depression recurs she can resume Abilify   but lower dose 2 mg daily or every other day to try to avoid the insomnia where she can contact our office and we can discussed the possibility of an alternative antidepressant such as Trintellix  or duloxetine .  12/28/2021 appointment noted: Several phone calls since she was here.  Abilify  plus Lexapro  failed.  Switch to duloxetine  90 mg which she started 12/02/2018 2023 Started lamotrigine . Also started Rexulti  Sister has had cycling depression that has responded well to lamotrigine  with poor response to SSRIs [name] with a little dementia. SE tremor 90 mg duloxetine , ? Wt gain over a few weeks.. Exercise and wt control Social isolation, low self esteem, worry about the way she looks and not usually that way. Crying better with duloxetine .  More dependent on H.   Anhedonia.  Black hole.   Never this low. No SI Having to use Xanax .   Asks about day treatment and TMS Plan: rec TMS Continue lamotrigine  as prescribed Increase Rexulti  to 1 mg daily Switch to Trintellix :  reduce duloxetine  to 2 of the 30 mg capsules and start Trintellix  5 mg daily for 5 days,  Then Increase Trintellix  to 10 mg daily and reduce duloxetine  to 1 capsule for 1 week. Then stop duloxetine .  02/15/22 appt noted: On Trintellix  , Rexulti  1 and lamotrigine  I feel good.  Walks 3 miles daily is great for mental health. TMS [facility] not be covered by [facility]. Started feeling better after a week or 2 after the last visit. SE appetite increased. No nausea. Not crying anymore.  Sleeping well and no longer ruminates. Plan: Continue Trintellix  but DC Rexulti  due to weight gain.  Continue lamotrigine   03/06/2022 appointment with the following noted: Last 2 weeks has been very sad and nervous. Trintellix  was increased to 20 mg daily  03/10/2022 complaining of ongoing depression, feeling jittery, negative thinking, early morning awakening, ruminating about past decisions.  Wanting to consider TMS because she is desperate for improvement. Plan we will schedule urgent appointment  03/15/22 urgent appt noted:  seen with Andrea [name] Very depressed, worst ever.  Don't want to be alone, ongoing worry, ruminating on past decisions.  Racing negative thoughts.  No motivation.  Trouble staying asleep.  Getting 4 hours and used to 9 hours. Dread getting up. Using Xanax  Increased Trintellix  to 20 mg 9 days ago. H agrees sleep is critical. Isolating.   Need to be better by July 11. Plan: Clonazepam  1 mg HS. Continue trintellix  20 mg daily she is only been on this dose 9 days and it needs more time. Re lamotrigine  and increase to 150 mg daily..    Vraylar  1.5 mg every other day Option TMS  03/24/22 appt noted: So much better.  Thinking straighter and not negative and not sad.  Sleeping better 8-9 hours.. No crying. H notices  progressively better every day.  Mind clarity is much better.   No SE noted.  No hangover.  No nausea. Found a therapist, Andrea [name].   Feels well enough to go to [place] and kind of excited.  Back July 16.   Plan: Clonazepam  1 mg HS. This resolved insomnia without SE Continue trintellix  20 mg daily she is only been on this dose 20 days and it needs more time. Re lamotriginecontinue 150 mg daily..    Vraylar  1.5 mg every other day Option TMS  04/12/22 appt noted: Great.  Went to [place].  Was herself and enjoyed it.  Anxiety was managed.   [facility] and liked her. 3# wt gain.   Patient reports stable mood and denies depressed or irritable moods.  Patient denies any recent difficulty with anxiety.  Patient denies difficulty with sleep initiation or maintenance. Denies appetite disturbance.  Patient reports that energy and motivation have been good.  Patient denies any difficulty with concentration.  Patient denies any suicidal ideation. Sleeping well than not anxious. Plan: Trintellix  20  07/10/22 TC depression returned recently.  08/15/22 appt noted: Still blunted  and diminished motivation.  Some days better than others.  Going to gym.  Partially better.   Still has a lot of anxiety and doesn't want to be alone. Functioning but not normal.  Dreads parties.   SE tremor for a couple of week.sleep great.  Dep 7/10. Current Vraylar  1.5 mg daily, increase lamotrigine  200 mg daily, Trintellix  20 , clonazepam  0.5 mg HS Plan: Reduce Clonazepam  1/2  mg HS. This resolved insomnia without SE Continue trintellix  20 mg daily only mildly effective. lamotrigine  continue 200 mg daily..    DC Vraylar  DT lost response Auvelity  Stop Vraylar  After Thanksgiving stop Trintellix  Wait 3 days then Start Auvelity  1 in the morning for 1 week,  and if no side effects then increase Auvelity  to 1 in the AM and 1 in the PM  09/04/22 TC: Complaining of persistent depression and wanting to  do something else to help.  Added lithium  CR 300 mg nightly  09/11/2022 phone call complaining of no improvement with Auvelity  and lithium .  Appointment was moved up.  09/13/22 appt noted: Nothing  changed. No better and no worse.  Awakens sad and tearful.  Sleeps well. Pushing herself to the gym.  Has a trainer. On clonazepam  0.5 mg HS She remains anxious when alone for no apparent reason.  Feelings of inferiority.  She is normally extroverted but now she is wanting to isolate from people.  All tasks seem monumental.  No enjoyment and not looking forward to anything.  Everything is a personal assistant.  She remains generally worried and anxious which is not typical for her. Plan: Clonazepam  1/2  mg HS. This resolved insomnia without SE Stop Auvelity  Start nortriptyline  1 of the 25 mg capsules at night for 4 nights then 2 at night for 4 nights then 3 at night, then wait 1 week and get the blood test in the morning at [facility] Reduce lamotrigine  to 1 and 1/2 tablets daily  Lamotrigine  has not been helpful to him 100 mg daily.  Start reducing lamotrigine  150 mg daily and will continue to taper at next appointment.  09/27/21 urgent appt :  she wanted urgent appt bc desperate to feel better.  Seen with H Called at least twice since here for the same reasons of depression. Not sleeping well even with clonazepam  1 mg HS. Doesn't want to be alone. Shakey.   On lithium  300 mg daily, nortriptyline  75 mg HS.   Doesn't want to live like this but not acutely suicidal. Tolerating meds. Plan: pursue [facility] protocol TMS at [facility] bc faster optin than alternatives  10/30/22 TC: finished 50 TMS tx in the week and wants appt ASAP bc still struggling with crying and depresssion..  11/03/22 urgent appt : Completed TMS last week. Tue-Thur better days and then Friday nose-dived. Not done well since got back.   H notes high anxiety and crying in AM and PM and doesn't want to be alone.  Xanxax seems to help. Xanax  helps and  taking 0.5 mg AM and 1 mg HS H notes memory is sharper and she's more alert after treatment. Very anxioius all the time.  Get up sad.  Exercising. Sleep good with Xanax  1 mg HS. Plan: Increase Lexapro  20 mg dailly (should help crying and maybe anxiety but unlikely to resolve depression) Increase alprazolam  to 0.5 mg TID and 1 mg HS for severe anxiety. H notes it helps. Pramipexole  0.25 BID for 5 days and if NR then 0.5 mg Bid off label. Reduce lamotrigine  100 mg for 2 weeks then 50 mg daily for 2 weeks then stop it. She wants to pursue Spravato  if pramipexole  fails  11/17/22 emergency work in appt: Pramipexole  was not sent in and Andrea [name] not aware until yesterday.  She is not any better and is desperate for a med change. Sleeping well and taking Xanax  1 mg HS Mornings are worse.  Not crying much.  Last Friday was a bad day and she didn't want to be alone.  But did let her H play golf once this week. Reduced lamotrigine  today to 50 BID and weaning off. H says better this week than last week. Increased Lexapro  to 20 mg daily. Plan: Increased Lexapro  20 mg dailly (helped crying and maybe anxiety but unlikely to resolve depression) Continue alprazolam  to 0.5 mg TID prn anxiety and 1 mg HS. H notes it helps. Pramipexole  0.25 mg BID for 3 days then 0.5 mg BID Reduce lamotrigine  50 mg daily for 2 weeks then stop it. She wants to pursue Spravato  if pramipexole  fails  12/25/22 appt urgently. COMPLETED 1 WEEK TMS [facility]  without benefit except TMS took away brain fog.   She feels need to take Xanax  0.5 mg TID  Some sleepiness with Xanax  . Mornings are better and fine while at the gym but when home gets depressed again.   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 Needs Xanax  to do social things. Plan: Increased Lexapro  20 mg dailly (helped crying and maybe anxiety but unlikely to resolve depression) Continue alprazolam  to 0.5 mg TID prn anxiety and 1 mg HS. H notes it helps. Change pramipexole  0.5mg  tablets, 1  tablet four times daily for 1 week,  Then if not better 1and 1/2 tablets in the AM and dinner and 1 tablet at lunch and dinner.    4/9-4/15 hosp for ruptured appendix and missed meds: Can restart Lexapro  at 1/2 tablet daily for 3 days then 1 daily. Restart 1/2 pramipexole  tablet 3 times daily for 3 days then 1 tablet 3 times daily for 3 days then 1 and 1/2 tablet 3 times daily.      01/12/23 appt noted: No GI sx now.  No appetite but working on protein.   I think I'm doing real well mentally.  Has resumed meds.  Mood has been great. Pramipexole  0.5mg   1 and 1/2 TID Lexapro  20 Xanax  reduced to 1 mg HS H sees a difference.   No SE with meds.   No med changes  01/25/23 appt noted: Meds:  pramipexole  0.75 mg TID, Xanax  1.5 mg HS and 0.5 mg prn, Lexapro  20 mg daily. Doing great.  Staples removed and healing from surgery appendectomy. Back in the gym helps her.   Satisfied with meds. Getting out socially and feeling like her old self.  Andrea's H Brad went to ER vomiting blood.   Plan: no med changes  02/07/23 appt noted: Back from [facility] yesterday.  Had ear infection.  Then conjunctivitis.  Eyes hurt.   Before the surgery was getting better and feels like she is sliding back some.  Gradually a little worse since here.  Wonders if she could get better with joy and social drive.   Off Abx .  Sleep is good with meds.  With depression then gets anxious too.  Is functioning.   Meds:  pramipexole  0.75 mg TID, Xanax  1.5 mg HS and 0.5 mg prn, Lexapro  20 mg daily. Consistent and no SE. Plan: Continue Lexapro  20 mg dailly (helped crying and maybe anxiety but unlikely to resolve depression) Continue alprazolam   1 mg HS. H notes it helps. Increase pramipexole  0.5mg  tablets, 2 tablets 3 times daily  01/31/23 TC:Con Andrea [name], CMA  to Me     02/20/23  4:56 PM Note Patient reporting she is not feeling as good as she thinks she should. Her anxiety is the biggest concern. She said she can go to the  gym in the mornings and does ok, but she had a closing to go to recently and had to take 1/4 of a Xanax , reports it took the edge off. She reports daily tremors that are helped by Xanax . She is not crying, she is getting things done, sleeping okay. Will have her grandchildren next week and she isn't excited about it like she should be.    Taking 6 mirapex , 1.5 Xanax  and Lexapro  20 mg.     Andrea [name] resp:  CC   02/20/23  5:48 PM Note We just increased the pramipexole  recently and I see her next week.  I do not want to increase the medicine any further until I  see her again.      03/02/23 urgent appt noted: Some improvement with incr pramipexole  1 mg TID. Before felt worse in AM now feels better in AM and goes to gym. Otherwise no excitement.  Hard to make decisions. Less social and smiling.  Subdued more than normal.  Not confident.  Not enjoying present and ruminates in past.   No SE.  No impulsivity Has tremor for awhile .  Is mild for a month. Hard to conc.  Brain fog was better with TMS but is back. Continue Lexapro  20 mg dailly (helped crying and maybe anxiety but unlikely to resolve depression) Continue alprazolam   1 mg HS. H notes it helps. Increase pramipexole  1 mg QID times daily  03/16/23 appt noted: Meds as above SE: If not enough food makes her excitable, urgent, talking fast.  Does it a bit too much.  Energy fine.   Feels better in the AM at the gym and when home a dark shadow comes over her.   Still feels dep overall.  Went to work with friends and didn't feel natural and didn't enjoy it like she should.  Pushes herself to stay active.   Takes 1.5 mg Xanax  HS and then awakens and takes another 0.5 mg HS but gets enough sleep. A lot of brain fog.  Feels inferior and doesn't usually feel that way. Infrequent crying spells. Plan:  Continue Lexapro  20 mg dailly (helped crying and maybe anxiety but unlikely to resolve depression) Continue alprazolam   1 mg HS. H notes it helps. Reduce  pramipexole  1 mg TID for 3 days, then 0.5 mg QID for 3 days, then 0.5 mg BID for 3 days, then 0.25 mg BID for 3 days, then stop it. Now start Latuda  (lurasidone ) 40 mg 1/2 tablet for 1 week.  If not benefit then 1 tablet daily with 350 calories  04/13/23 urgent appt note: with [facility] 04/05/23 for dep with SI Worst I've ever been.  Feels out of control.  Shaking .  Can't sleep.  Feels disconnected.   Hated being hosp bc locked up.   Taking quetiapine  100 hS and lorazepam and still can't sleep. Started sertraline  50 and stopped Lexapro . Quetiapine  100 mg HS Plan: Stop hydroxyzine Increase quetiapine  200 then 300 mg at night for sleep and depression  Stop lorazepam and return to alprazolam  1 mg at night for sleep Increase sertraline  to 100 mg daily or 2 of 50 mg nightly  04/23/23 appt noted: Here for first appt with Spravato .  Highly anxious about getting Spravato  today.  Doesn't like feeling out of control and worries over this. Still very depressed.  Doesn't think her sx as noted are due to depression.  Thinks she has a neuro problem.  But she has no other neuro sx like numbness, weakness, balance px, or anything other than dep and anxiety sx. Received Spravato  56 mg today.  It was horrible! Bc she felt out of control and was anxious receiving it.  Thought it would last forever re: dissociation.  It was scary to her.  Couldn't relax for it.  No N, V, HA.  Ambivalent about continuing Spravato  but open. No specific concerns with meds.    04/25/23 appt noted: Needed Xanax  before visit today to overcome fear of coming to the appt. She was more disciplined about controlling neg thought during Spravato  admin and had a much better experience.  It was not scary nor associated with sig fear.  She is still dep and anxious  without change otherwise since seen a couple of days ago. Tolerating med changes from last week.   Received Spravato  56 mg again and tolerated it without adverse SE.  Typical SE  dissociation, no NV, palpitations, HA.  Motivated to continue the Spravato  and agrees to increase it.  Better experience than the first time. Plan: Increase quetiapine  to 400 mg HS  04/26/23 TC:  Patient taking 300 mg of Seroquel  and 1 mg of Xanax  for sleep. She can get to sleep, just can't stay asleep. Doesn't say how long she is sleeping, but says she needs 9 hours of sleep. Reporting social isolation is bad, she won't leave the house, but doesn't like being home alone.  Reports she has never been this sick mentally and feels like she may need to go to the hospital. No SI, just doesn't like being alone.     Andrea [name] resp:  Pt just seen yesterday and received 2nd Spravato  56 mg .  That was positive experience. However she is still severely depressed and ruminating and reassurance seeking..  She doesn't need to go to the hospital. She told me yesterday she slept 7 hours but needs 9 hours.  7 hours is not bad.   She is taking quetiapne 300 mg HS with Xanax  1 mg HS.  No increase in Xanax  but can increase quetiapine  to 400 mg HS (max dose 800) and this could help sleep and anxiety and depression. She should keep appt next week for next Spravato .  In case she asks, Spravato  is not making her worse in any way.     05/01/23 appt noted;H present also Current meds; quetiapine  400 mg HS, sertraline  100 mg daily, alprazolam  0.5 mg TID and 1 mg HS. Tolerating med changes.    Received Spravato  84 mg first time.  Typical SE dissociation, no NV, palpitations, HA.  However she had a bad experience DT severe anxiety before starting the Spravato .   Very fearful, scared.  Without specific reasons.  Severe anxiety dep, hopeless.  No SI but doesn't want to live like this.  So anxious hard to sleep and some crying spells. Seroquel  not helping sleep.  05/03/23 appt noted: Current meds; quetiapine  400 mg HS, sertraline  100 mg daily, alprazolam  0.5 mg TID and 1 mg HS. Tolerating med changes.    Received Spravato  84 mg.   Typical SE dissociation, no NV, palpitations, HA.  However she had a bad experience DT severe anxiety before starting the Spravato .   Highly anxious, fearful of everything including Spravato  admin.  Afraid she will forget who she is despite having it before.  Still anxious and trouble sleeping at home.  Sleep no better with quetiapine  and still ruminatiing.   Plan increase quetiapine  to 500 mg HS  05/08/23 appt noted: Meds: quetiapine  500 mg HS, sertraline  100 mg daily, alprazolam  0.5 mg TID and 1 mg HS. Received Spravato  84 mg 3rd time.  Typical SE dissociation, no NV, palpitations, HA.  However she had a bad experience DT severe anxiety before starting the Spravato .   She is not better so far.  Ongoing severe depression anxiety, avoidance, anhedonia, rumination, afraid to be alone. All not typical of herself.   No SE with meds except constipation and sweating at night.  Plan:  Increase quetiapine  to 600 mg at night for sleep and depression & rumination and next week to 600 HS increased alprazolam  1 mg at night for sleep and 0.5 mg TID Increase sertraline  150 mg daily  Spravato  84  mg AM twice weekly for severe TRD  05/10/23 appt noted:  Meds: quetiapine  600 mg HS, sertraline  150 mg daily, alprazolam  0.5 mg TID and 1 mg HS. Received Spravato  84 mg 4th time.  Typical SE dissociation, no NV, palpitations, HA.  However she had a bad experience DT severe anxiety before starting the Spravato .   She is not better so far.  Ongoing severe depression anxiety, avoidance, anhedonia, rumination, afraid to be alone. All not typical of herself.   SE constipation she finds unmanageable  Did sleep better with increased quetiapine  600 mg Hs but cannot continue dT constipation.  However dep and anxiety are no better with Spravato  or the other treatments except better sleep.  Anxiety ongoing severe with rumination.  05/17/23 appt noted:  Meds: switched to olanzapine  15 mg HS, sertraline  150, alprazolam   Tremor  seems worse but H disagrees. Mood continually worse.  She is dependent on H and afraid to be alone.  No sweating. Xanax  helped but wears off after a couple of hours.  She is open to antoher option Tolerating med changes. No improvement except slept a little better last night but still not enough. No Spravato  today bc 2 weeks of it didn't help.  Agrees to pursue ECT but hoping med changes will help Crying spells.  07/10/23 appt noted: Great.  Gerlean back at work.  Playing golf again.   Med: sertraline  200, propranolol  10 BID, olanzapine  15 pm, clonazepam  1 mg HS Did not require ECT.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 consultation.  She is planning to get at [facility].    Plan:  Reduce sertraline  to 1/2 tablet daily for 1 week and then stop it.  Wait 10 days and call to  begin the Parnate.  Will continue olanzapine  and Seroquel  for now in hopes of buffering mood from the expected worsening while weaning off sertraline .  Also bc she felt worse trying to reduce either of these meds.  FU 4 wks  Andrea Macintosh, Andrea [name], Andrea [name]     Future Appointments  Date Time Provider Department Center  09/23/2024  2:20 PM [facility] ANNUAL WELLNESS VISIT 1 [facility] [address]       No orders of the defined types were placed in this encounter.      -------------------------------me

## 2024-07-25 ENCOUNTER — Telehealth: Payer: Self-pay | Admitting: Psychiatry

## 2024-07-25 ENCOUNTER — Other Ambulatory Visit: Payer: Self-pay | Admitting: Psychiatry

## 2024-07-25 MED ORDER — OLANZAPINE 5 MG PO TABS: 5.0000 mg | ORAL_TABLET | Freq: Every day | ORAL | 0 refills | Status: DC

## 2024-07-25 NOTE — Telephone Encounter (Signed)
 Olanzapine  RF sent. Xanax  LF 10/7, due 11/4. She was notified and has enough Xanax  to get her to next RF.

## 2024-07-25 NOTE — Telephone Encounter (Signed)
 Andrea [name] lvm that she needs refills on her xanax  and olanzapine . She said that she was going out of town and needed her meds. Please call her at [phone]

## 2024-07-26 DIAGNOSIS — J019 Acute sinusitis, unspecified: Secondary | ICD-10-CM | POA: Diagnosis not present

## 2024-07-26 DIAGNOSIS — R350 Frequency of micturition: Secondary | ICD-10-CM | POA: Diagnosis not present

## 2024-07-28 ENCOUNTER — Telehealth: Payer: Self-pay | Admitting: Psychiatry

## 2024-07-28 ENCOUNTER — Other Ambulatory Visit: Payer: Self-pay | Admitting: Psychiatry

## 2024-07-28 DIAGNOSIS — F5105 Insomnia due to other mental disorder: Secondary | ICD-10-CM

## 2024-07-28 NOTE — Telephone Encounter (Signed)
 Andrea [name]  called and said that she only has 2 pills left of her xanax . She needs a refill and she is willing to pay out of pocket. She knows that it is early.

## 2024-07-28 NOTE — Telephone Encounter (Signed)
 Pt is due for RF 11/4. Pended to Dr. Geoffry.

## 2024-07-29 DIAGNOSIS — Z4889 Encounter for other specified surgical aftercare: Secondary | ICD-10-CM | POA: Diagnosis not present

## 2024-07-29 NOTE — Telephone Encounter (Signed)
 Xanax  RF addressed thru pharmacy interface.

## 2024-08-06 ENCOUNTER — Telehealth: Payer: Self-pay | Admitting: Psychiatry

## 2024-08-06 NOTE — Telephone Encounter (Signed)
 Call Andrea [name] and cancel her appt with me at 1 pm telling her an emergency came up that I have to handle.  Call Andrea [name] and tell him I will call him at 1 pm and do an emergency conference call with Andrea [name] (unless they can drive here but that is not necessary.  We can do it virutually.)

## 2024-08-06 NOTE — Telephone Encounter (Signed)
 Gerlean Breeonna [name]'s husband called and said that Andrea [name] is in a state of depression and says she has no hope. Please call her they are aware of the med changes dr. Geoffry is doing. Please call Gerlean at [phone]

## 2024-08-06 NOTE — Telephone Encounter (Signed)
 LVM to [facility]

## 2024-08-06 NOTE — Telephone Encounter (Signed)
 Pt seen 10/28 with FU 11/21.  Plan:  Reduce sertraline  to 1/2 tablet daily for 1 week and then stop it.   Wait 10 days and call to begin the Parnate.   Will continue olanzapine  and Seroquel  for now in hopes of buffering mood from the expected worsening while weaning off sertraline .  Also bc she felt worse trying to reduce either of these meds.   She reports taking the last dose of sertraline  on 11/6. She said she is really low, thinking weird thoughts, no SI. I reviewed note with her, asking if there is anything you can do now.

## 2024-08-07 ENCOUNTER — Ambulatory Visit (INDEPENDENT_AMBULATORY_CARE_PROVIDER_SITE_OTHER): Admitting: Psychiatry

## 2024-08-07 ENCOUNTER — Other Ambulatory Visit: Payer: Self-pay

## 2024-08-07 DIAGNOSIS — R251 Tremor, unspecified: Secondary | ICD-10-CM | POA: Diagnosis not present

## 2024-08-07 DIAGNOSIS — F339 Major depressive disorder, recurrent, unspecified: Secondary | ICD-10-CM

## 2024-08-07 DIAGNOSIS — F411 Generalized anxiety disorder: Secondary | ICD-10-CM | POA: Diagnosis not present

## 2024-08-07 DIAGNOSIS — F5105 Insomnia due to other mental disorder: Secondary | ICD-10-CM | POA: Diagnosis not present

## 2024-08-07 MED ORDER — NIFEDIPINE 10 MG PO CAPS: ORAL_CAPSULE | ORAL | 0 refills | Status: AC

## 2024-08-07 MED ORDER — TRANYLCYPROMINE SULFATE 10 MG PO TABS: ORAL_TABLET | ORAL | 0 refills | Status: DC

## 2024-08-07 NOTE — Progress Notes (Unsigned)
 EGAN SAHLIN [id number] 04/02/1951 73 y.o.   Subjective:   Patient ID:  Andrea [name] is a 73 y.o. (DOB 1950-10-27) female.  Chief Complaint:  Chief Complaint  Patient presents with  . Follow-up  . Depression  . Anxiety    HPI ANGELLICA MADDISON presents to the office today for follow-up of MDE and anxiety disorder.  seen 09/2019.  No meds were changed.  Been on Lexapro  20 since early 2017.  02/27/2020 phone call from patient: Pt experiencing more anxiety than she normally has. Pt would like to know what are some options that she has as far as her medication. Pt will be in an appt  MD response: If the anxiety is just occasional then using alprazolam  as needed would be an okay thing to do.  If it is been fairly persistent for a couple of weeks or more than the simplest solution would be to increase the Lexapro  to 1-1/2 tablets daily.  Historically she has done well on Lexapro  20 mg daily for an extended period of time.  If she is under temporary stress then once that is resolved we could drop it back to 1 daily.  If this does not work as expected then she should schedule an earlier appointment. Pt response: Patient called back and she's been having temporary stress, but it's not bad. Things are great, she's working out and feeling okay for the most part. It's been going on for a bit so she agrees to the increase Lexapro  20 mg 1.5 tablets daily. She has been taking alprazolam  at hs occasionally to help sleep better and it does help.   12/06/2020 appointment with the following noted:  No covid.  H Covid and recovered.   Had increased Lexapro  to 30 mg for 90 days and saw a difference but started seeing weight gain and was doing oK and able to drop back and been OK. Still renovating a house for a year.  Sold her house and mourns the house. Calmed down.  Kept 3 gkids and dogs 10 days.   VEAR Collum Hopes for new house May 1. Rare alprazolam  for HS. Stress moving to condo and would have crying spell over the  move and some problems she was running into dealing with it.  Hard with change.  But taking time to adjust to the idea of moving from her house of 23 years.  Initially refused but he left it up to her and she's come to peace about it.  VEAR Collum.   Good response to medication and no SE.  Exercising is good medicine.  If doesn't then doesn't feel as good. Satisfied. Plan: no med changes  08/01/21  appt noted:  seen with [name] died 07/28/2024.  A lot of ups and downs per D.  Hard dips. Got worse when retired and dealing with the new  house.  Had trouble getting rid of things from the old house causing regrets over sellling the house.  D notes doesn't do well with instability  and stressors.  Ruminates on past mistakes. Blamed Collum for the  difficulty renovating the house.  Neg self talk and beats herself up. When not enough sleep will get depressed.  Does better if busy.   Dreads things.  Patient denies difficulty with sleep initiation or maintenance. Denies appetite disturbance.  Patient reports that energy and motivation have been good. Patient denies any difficulty with concentration.  Patient denies any suicidal ideation.  Plan: Abilify  2.5 mg  daily for a week, then 5 mg daily. Continue Lexapro  20 mg daily  08/31/2021 appt noted: It worked immediately.  Insomnia with EMA. Normally 9 hours.  It's like I'm high.  Not tired.  Average 3-4 hours. Not depressed and no crazy negative thoughts.  Good response.  Energetic but up in middle of night. Plan: Okay to stop Abilify  because of insomnia.   09/29/2021 appointment with the following noted: Parties were great.  Sleep problem resolved.  A lot of rest at the beach. Maybe the last week or 2 less motivation and socialization.  Not laying in the bed.  Just not as good as she was.  Not as outgoing as normal. No alcohol now.  Only had 1-2 at night.  Not ruminating.   Willing to restart Abilify  at lower dose 2 mg daily. 3 grandsons  Plan: Relapse  depression recurs she can resume Abilify   but lower dose 2 mg daily or every other day to try to avoid the insomnia where she can contact our office and we can discussed the possibility of an alternative antidepressant such as Trintellix  or duloxetine .  12/28/2021 appointment noted: Several phone calls since she was here.  Abilify  plus Lexapro  failed.  Switch to duloxetine  90 mg which she started 12/02/2018 2023 Started lamotrigine . Also started Rexulti  Sister has had cycling depression that has responded well to lamotrigine  with poor response to SSRIs [name] with a little dementia. SE tremor 90 mg duloxetine , ? Wt gain over a few weeks.. Exercise and wt control Social isolation, low self esteem, worry about the way she looks and not usually that way. Crying better with duloxetine .  More dependent on H.   Anhedonia.  Black hole.  Never this low. No SI Having to use Xanax .   Asks about day treatment and TMS Plan: rec TMS Continue lamotrigine  as prescribed Increase Rexulti  to 1 mg daily Switch to Trintellix :  reduce duloxetine  to 2 of the 30 mg capsules and start Trintellix  5 mg daily for 5 days,  Then Increase Trintellix  to 10 mg daily and reduce duloxetine  to 1 capsule for 1 week. Then stop duloxetine .  02/15/22 appt noted: On Trintellix  , Rexulti  1 and lamotrigine  I feel good.  Walks 3 miles daily is great for mental health. TMS would not be covered by [facility]. Started feeling better after a week or 2 after the last visit. SE appetite increased. No nausea. Not crying anymore. Sleeping well and no longer ruminates. Plan: Continue Trintellix  but DC Rexulti  due to weight gain.  Continue lamotrigine   03/06/2022 appointment with the following noted: Last 2 weeks has been very sad and nervous. Trintellix  was increased to 20 mg daily  03/10/2022 complaining of ongoing depression, feeling jittery, negative thinking, early morning awakening, ruminating about past decisions.  Wanting to consider TMS  because she is desperate for improvement. Plan we will schedule urgent appointment  03/15/22 urgent appt noted:  seen with VEAR Collum Very depressed, worst ever.  Don't want to be alone, ongoing worry, ruminating on past decisions.  Racing negative thoughts.  No motivation.  Trouble staying asleep.  Getting 4 hours and used to 9 hours. Dread getting up. Using Xanax  Increased Trintellix  to 20 mg 9 days ago. H agrees sleep is critical. Isolating.   Need to be better by July 11. Plan: Clonazepam  1 mg HS. Continue trintellix  20 mg daily she is only been on this dose 9 days and it needs more time. Re lamotrigine  and increase to 150 mg daily.SABRA  Vraylar  1.5 mg every other day Option TMS  03/24/22 appt noted: So much better.  Thinking straighter and not negative and not sad.  Sleeping better 8-9 hours.. No crying. H notices progressively better every day.  Mind clarity is much better.   No SE noted.  No hangover.  No nausea. Found a therapist, Heather Jungling.   Feels well enough to go to [place] and kind of excited.  Back July 16.   Plan: Clonazepam  1 mg HS. This resolved insomnia without SE Continue trintellix  20 mg daily she is only been on this dose 20 days and it needs more time. Re lamotriginecontinue 150 mg daily..    Vraylar  1.5 mg every other day Option TMS  04/12/22 appt noted: Great.  Went to [place].  Was herself and enjoyed it.  Anxiety was managed.   [facility] and liked her. 3# wt gain.   Patient reports stable mood and denies depressed or irritable moods.  Patient denies any recent difficulty with anxiety.  Patient denies difficulty with sleep initiation or maintenance. Denies appetite disturbance.  Patient reports that energy and motivation have been good.  Patient denies any difficulty with concentration.  Patient denies any suicidal ideation. Sleeping well than not anxious. Plan: Trintellix  20  07/10/22 TC depression returned recently.  08/15/22 appt  noted: Still blunted  and diminished motivation.  Some days better than others.  Going to gym.  Partially better.   Still has a lot of anxiety and doesn't want to be alone. Functioning but not normal.  Dreads parties.   SE tremor for a couple of week.sleep great.  Dep 7/10. Current Vraylar  1.5 mg daily, increase lamotrigine  200 mg daily, Trintellix  20 , clonazepam  0.5 mg HS Plan: Reduce Clonazepam  1/2  mg HS. This resolved insomnia without SE Continue trintellix  20 mg daily only mildly effective. lamotrigine  continue 200 mg daily..    DC Vraylar  DT lost response Auvelity  Stop Vraylar  After Thanksgiving stop Trintellix  Wait 3 days then Start Auvelity  1 in the morning for 1 week,  and if no side effects then increase Auvelity  to 1 in the AM and 1 in the PM  09/04/22 TC: Complaining of persistent depression and wanting to do something else to help.  Added lithium  CR 300 mg nightly  09/11/2022 phone call complaining of no improvement with Auvelity  and lithium .  Appointment was moved up.  09/13/22 appt noted: Nothing changed. No better and no worse.  Awakens sad and tearful.  Sleeps well. Pushing herself to the gym.  Has a trainer. On clonazepam  0.5 mg HS She remains anxious when alone for no apparent reason.  Feelings of inferiority.  She is normally extroverted but now she is wanting to isolate from people.  All tasks seem monumental.  No enjoyment and not looking forward to anything.  Everything is a personal assistant.  She remains generally worried and anxious which is not typical for her. Plan: Clonazepam  1/2  mg HS. This resolved insomnia without SE Stop Auvelity  Start nortriptyline  1 of the 25 mg capsules at night for 4 nights then 2 at night for 4 nights then 3 at night, then wait 1 week and get the blood test in the morning at [facility] Reduce lamotrigine  to 1 and 1/2 tablets daily  Lamotrigine  has not been helpful to him 100 mg daily.  Start reducing lamotrigine  150 mg daily and will continue to  taper at next appointment.  09/27/21 urgent appt :  she wanted urgent appt bc desperate to  feel better.  Seen with H Called at least twice since here for the same reasons of depression. Not sleeping well even with clonazepam  1 mg HS. Doesn't want to be alone. Shakey.   On lithium  300 mg daily, nortriptyline  75 mg HS.   Doesn't want to live like this but not acutely suicidal. Tolerating meds. Plan: pursue [facility] protocol TMS at [facility] bc faster optin than alternatives  10/30/22 TC: finished 50 TMS tx in the week and wants appt ASAP bc still struggling with crying and depresssion..  11/03/22 urgent appt : Completed TMS last week. Tue-Thur better days and then Friday nose-dived. Not done well since got back.   H notes high anxiety and crying in AM and PM and doesn't want to be alone.  Xanxax seems to help. Xanax  helps and taking 0.5 mg AM and 1 mg HS H notes memory is sharper and she's more alert after treatment. Very anxioius all the time.  Get up sad.  Exercising. Sleep good with Xanax  1 mg HS. Plan: Increase Lexapro  20 mg dailly (should help crying and maybe anxiety but unlikely to resolve depression) Increase alprazolam  to 0.5 mg TID and 1 mg HS for severe anxiety. H notes it helps. Pramipexole  0.25 BID for 5 days and if NR then 0.5 mg Bid off label. Reduce lamotrigine  100 mg for 2 weeks then 50 mg daily for 2 weeks then stop it. She wants to pursue Spravato  if pramipexole  fails  11/17/22 emergency work in appt: Pramipexole  was not sent in and MD not aware until yesterday.  She is not any better and is desperate for a med change. Sleeping well and taking Xanax  1 mg HS Mornings are worse.  Not crying much.  Last Friday was a bad day and she didn't want to be alone.  But did let her H play golf once this week. Reduced lamotrigine  today to 50 BID and weaning off. H says better this week than last week. Increased Lexapro  to 20 mg daily. Plan: Increased Lexapro  20 mg dailly (helped crying  and maybe anxiety but unlikely to resolve depression) Continue alprazolam  to 0.5 mg TID prn anxiety and 1 mg HS. H notes it helps. Pramipexole  0.25 mg BID for 3 days then 0.5 mg BID Reduce lamotrigine  50 mg daily for 2 weeks then stop it. She wants to pursue Spravato  if pramipexole  fails  12/25/22 appt urgently. COMPLETED 1 WEEK TMS [facility] without benefit except TMS took away brain fog.   She feels need to take Xanax  0.5 mg TID  Some sleepiness with Xanax  . Mornings are better and fine while at the gym but when home gets depressed again.   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 Needs Xanax  to do social things. Plan: Increased Lexapro  20 mg dailly (helped crying and maybe anxiety but unlikely to resolve depression) Continue alprazolam  to 0.5 mg TID prn anxiety and 1 mg HS. H notes it helps. Change pramipexole  0.5mg  tablets, 1 tablet four times daily for 1 week,  Then if not better 1and 1/2 tablets in the AM and dinner and 1 tablet at lunch and dinner.    4/9-4/15 hosp for ruptured appendix and missed meds: Can restart Lexapro  at 1/2 tablet daily for 3 days then 1 daily. Restart 1/2 pramipexole  tablet 3 times daily for 3 days then 1 tablet 3 times daily for 3 days then 1 and 1/2 tablet 3 times daily.      01/12/23 appt noted: No GI sx now.  No appetite but  working on protein.   I think I'm doing real well mentally.  Has resumed meds.  Mood has been great. Pramipexole  0.5mg   1 and 1/2 TID Lexapro  20 Xanax  reduced to 1 mg HS H sees a difference.   No SE with meds.   No med changes  01/25/23 appt noted: Meds:  pramipexole  0.75 mg TID, Xanax  1.5 mg HS and 0.5 mg prn, Lexapro  20 mg daily. Doing great.  Staples removed and healing from surgery appendectomy. Back in the gym helps her.   Satisfied with meds. Getting out socially and feeling like her old self.  Chelsea's H Brad went to ER vomiting blood.   Plan: no med changes  02/07/23 appt noted: Back from [facility] yesterday.  Had ear infection.   Then conjunctivitis.  Eyes hurt.   Before the surgery was getting better and feels like she is sliding back some.  Gradually a little worse since here.  Wonders if she could get better with joy and social drive.   Off Abx .  Sleep is good with meds.  With depression then gets anxious too.  Is functioning.   Meds:  pramipexole  0.75 mg TID, Xanax  1.5 mg HS and 0.5 mg prn, Lexapro  20 mg daily. Consistent and no SE. Plan: Continue Lexapro  20 mg dailly (helped crying and maybe anxiety but unlikely to resolve depression) Continue alprazolam   1 mg HS. H notes it helps. Increase pramipexole  0.5mg  tablets, 2 tablets 3 times daily  01/31/23 TC:Con Dawna DASEN, CMA  to Me     02/20/23  4:56 PM Note Patient reporting she is not feeling as good as she thinks she should. Her anxiety is the biggest concern. She said she can go to the gym in the mornings and does ok, but she had a closing to go to recently and had to take 1/4 of a Xanax , reports it took the edge off. She reports daily tremors that are helped by Xanax . She is not crying, she is getting things done, sleeping okay. Will have her grandchildren next week and she isn't excited about it like she should be.    Taking 6 mirapex , 1.5 Xanax  and Lexapro  20 mg.     MD resp:  CC   02/20/23  5:48 PM Note We just increased the pramipexole  recently and I see her next week.  I do not want to increase the medicine any further until I see her again.      03/02/23 urgent appt noted: Some improvement with incr pramipexole  1 mg TID. Before felt worse in AM now feels better in AM and goes to gym. Otherwise no excitement.  Hard to make decisions. Less social and smiling.  Subdued more than normal.  Not confident.  Not enjoying present and ruminates in past.   No SE.  No impulsivity Has tremor for awhile .  Is mild for a month. Hard to conc.  Brain fog was better with TMS but is back. Continue Lexapro  20 mg dailly (helped crying and maybe anxiety but unlikely  to resolve depression) Continue alprazolam   1 mg HS. H notes it helps. Increase pramipexole  1 mg QID times daily  03/16/23 appt noted: Meds as above SE: If not enough food makes her excitable, urgent, talking fast.  Does it a bit too much.  Energy fine.   Feels better in the AM at the gym and when home a dark shadow comes over her.   Still feels dep overall.  Went to work with friends and  didn't feel natural and didn't enjoy it like she should.  Pushes herself to stay active.   Takes 1.5 mg Xanax  HS and then awakens and takes another 0.5 mg HS but gets enough sleep. A lot of brain fog.  Feels inferior and doesn't usually feel that way. Infrequent crying spells. Plan:  Continue Lexapro  20 mg dailly (helped crying and maybe anxiety but unlikely to resolve depression) Continue alprazolam   1 mg HS. H notes it helps. Reduce pramipexole  1 mg TID for 3 days, then 0.5 mg QID for 3 days, then 0.5 mg BID for 3 days, then 0.25 mg BID for 3 days, then stop it. Now start Latuda  (lurasidone ) 40 mg 1/2 tablet for 1 week.  If not benefit then 1 tablet daily with 350 calories  04/13/23 urgent appt note: with [facility] 04/05/23 for dep with SI Worst I've ever been.  Feels out of control.  Shaking .  Can't sleep.  Feels disconnected.   Hated being hosp bc locked up.   Taking quetiapine  100 hS and lorazepam and still can't sleep. Started sertraline  50 and stopped Lexapro . Quetiapine  100 mg HS Plan: Stop hydroxyzine Increase quetiapine  200 then 300 mg at night for sleep and depression  Stop lorazepam and return to alprazolam  1 mg at night for sleep Increase sertraline  to 100 mg daily or 2 of 50 mg nightly  04/23/23 appt noted: Here for first appt with Spravato .  Highly anxious about getting Spravato  today.  Doesn't like feeling out of control and worries over this. Still very depressed.  Doesn't think her sx as noted are due to depression.  Thinks she has a neuro problem.  But she has no other neuro sx like  numbness, weakness, balance px, or anything other than dep and anxiety sx. Received Spravato  56 mg today.  It was horrible! Bc she felt out of control and was anxious receiving it.  Thought it would last forever re: dissociation.  It was scary to her.  Couldn't relax for it.  No N, V, HA.  Ambivalent about continuing Spravato  but open. No specific concerns with meds.    04/25/23 appt noted: Needed Xanax  before visit today to overcome fear of coming to the appt. She was more disciplined about controlling neg thought during Spravato  admin and had a much better experience.  It was not scary nor associated with sig fear.  She is still dep and anxious without change otherwise since seen a couple of days ago. Tolerating med changes from last week.   Received Spravato  56 mg again and tolerated it without adverse SE.  Typical SE dissociation, no NV, palpitations, HA.  Motivated to continue the Spravato  and agrees to increase it.  Better experience than the first time. Plan: Increase quetiapine  to 400 mg HS  04/26/23 TC:  Patient taking 300 mg of Seroquel  and 1 mg of Xanax  for sleep. She can get to sleep, just can't stay asleep. Doesn't say how long she is sleeping, but says she needs 9 hours of sleep. Reporting social isolation is bad, she won't leave the house, but doesn't like being home alone.  Reports she has never been this sick mentally and feels like she may need to go to the hospital. No SI, just doesn't like being alone.     MD resp:  Pt just seen yesterday and received 2nd Spravato  56 mg .  That was positive experience. However she is still severely depressed and ruminating and reassurance seeking..  She doesn't need to  go to the hospital. She told me yesterday she slept 7 hours but needs 9 hours.  7 hours is not bad.   She is taking quetiapne 300 mg HS with Xanax  1 mg HS.  No increase in Xanax  but can increase quetiapine  to 400 mg HS (max dose 800) and this could help sleep and anxiety and  depression. She should keep appt next week for next Spravato .  In case she asks, Spravato  is not making her worse in any way.     05/01/23 appt noted;H present also Current meds; quetiapine  400 mg HS, sertraline  100 mg daily, alprazolam  0.5 mg TID and 1 mg HS. Tolerating med changes.    Received Spravato  84 mg first time.  Typical SE dissociation, no NV, palpitations, HA.  However she had a bad experience DT severe anxiety before starting the Spravato .   Very fearful, scared.  Without specific reasons.  Severe anxiety dep, hopeless.  No SI but doesn't want to live like this.  So anxious hard to sleep and some crying spells. Seroquel  not helping sleep.  05/03/23 appt noted: Current meds; quetiapine  400 mg HS, sertraline  100 mg daily, alprazolam  0.5 mg TID and 1 mg HS. Tolerating med changes.    Received Spravato  84 mg.  Typical SE dissociation, no NV, palpitations, HA.  However she had a bad experience DT severe anxiety before starting the Spravato .   Highly anxious, fearful of everything including Spravato  admin.  Afraid she will forget who she is despite having it before.  Still anxious and trouble sleeping at home.  Sleep no better with quetiapine  and still ruminatiing.   Plan increase quetiapine  to 500 mg HS  05/08/23 appt noted: Meds: quetiapine  500 mg HS, sertraline  100 mg daily, alprazolam  0.5 mg TID and 1 mg HS. Received Spravato  84 mg 3rd time.  Typical SE dissociation, no NV, palpitations, HA.  However she had a bad experience DT severe anxiety before starting the Spravato .   She is not better so far.  Ongoing severe depression anxiety, avoidance, anhedonia, rumination, afraid to be alone. All not typical of herself.   No SE with meds except constipation and sweating at night.  Plan:  Increase quetiapine  to 600 mg at night for sleep and depression & rumination and next week to 600 HS increased alprazolam  1 mg at night for sleep and 0.5 mg TID Increase sertraline  150 mg daily   Spravato  84 mg AM twice weekly for severe TRD  05/10/23 appt noted:  Meds: quetiapine  600 mg HS, sertraline  150 mg daily, alprazolam  0.5 mg TID and 1 mg HS. Received Spravato  84 mg 4th time.  Typical SE dissociation, no NV, palpitations, HA.  However she had a bad experience DT severe anxiety before starting the Spravato .   She is not better so far.  Ongoing severe depression anxiety, avoidance, anhedonia, rumination, afraid to be alone. All not typical of herself.   SE constipation she finds unmanageable  Did sleep better with increased quetiapine  600 mg Hs but cannot continue dT constipation.  However dep and anxiety are no better with Spravato  or the other treatments except better sleep.  Anxiety ongoing severe with rumination.  05/17/23 appt noted:  Meds: switched to olanzapine  15 mg HS, sertraline  150, alprazolam   Tremor seems worse but H disagrees. Mood continually worse.  She is dependent on H and afraid to be alone.  No sweating. Xanax  helped but wears off after a couple of hours.  She is open to antoher option Tolerating med  changes. No improvement except slept a little better last night but still not enough. No Spravato  today bc 2 weeks of it didn't help.  Agrees to pursue ECT but hoping med changes will help Crying spells.  07/10/23 appt noted: Great.  Gerlean back at work.  Playing golf again.   Med: sertraline  200, propranolol  10 BID, olanzapine  15 pm, clonazepam  1 mg HS Did not require ECT.  All of a sudden something clicked and gradually better .  Doesn't even recognize the person she was with depression.   No negative thoughts.  Laughing again.  More social and talking again.  Happy in the am.  Sleep very well 10-11 hours.  Everything back to normal .  Doesn't need Gerlean with her now.  Ok being alone.  Anxiety gone also. SE carb craving and wt gain 10#.   Plan: Propranolol  10 mg tablet 2-3 tablets twice daily as needed for tremor Continue clonazepam  1 mg tablet 1/2 twice  daily and 1 tablet at night Continue sertraline  200 tablets daily. Switch olanzapine  15 to Lybalvi 10 DT wt gain.    08/10/23 appt noted: Psych med: sertraline  200, propranolol  10 BID, Lybalvi 10 pm, clonazepam  1 mg HS Up to 141# with Lybalvi.  Went to wt reducing clinic and got shot semaglutide.  $75/ week.  That took appetite away.   Was a little more down after a couple of missed doses of Lybalvi. Mood had been great  until the missed dose.  Sleeping a lot.  No problems with change from olanzapine  15 to Lybalvi 10-10 daily but no benefit with appetite reduction.   Party tomorrow night.  Got house decorated yesterday.   Since appendectomy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 Lorene KANDICE Raddle., MD [facility] None  09/23/2024  2:20  PM [facility] ANNUAL WELLNESS VISIT 1 [facility] [address]  10/14/2024  2:30 PM Cottle, Lorene KANDICE Raddle., MD [facility] None       No orders of the defined types were placed in this encounter.      -------------------------------me

## 2024-08-08 ENCOUNTER — Telehealth: Payer: Self-pay | Admitting: Psychiatry

## 2024-08-08 ENCOUNTER — Encounter: Payer: Self-pay | Admitting: Psychiatry

## 2024-08-08 NOTE — Telephone Encounter (Signed)
 Yes, tell her to stop Concerta  as of today, that is don't take it tomorrow.  Ok if she took it today

## 2024-08-08 NOTE — Telephone Encounter (Signed)
 Pt wants to verify if she is suppose to stop the Concerta . She's looking at her AVS and its 2 asterisks by the medication and she doesn't know what they mean.

## 2024-08-08 NOTE — Telephone Encounter (Signed)
 Patient said she thought she was supposed to stop the Concerta  and didn't take today, just wanted to confirm. She asked for a link to be able to send MyChart messages instead of calling all the time. I sent her a link to send message to clinical staff.

## 2024-08-08 NOTE — Telephone Encounter (Signed)
 Pt asks if she is supposed to stop Concerta . I don't see any mention of his in your note from yesterday.

## 2024-08-09 ENCOUNTER — Other Ambulatory Visit: Payer: Self-pay | Admitting: Psychiatry

## 2024-08-12 ENCOUNTER — Telehealth: Payer: Self-pay

## 2024-08-12 NOTE — Telephone Encounter (Signed)
 Sent to Dr. Jennelle Human via [facility]

## 2024-08-12 NOTE — Telephone Encounter (Signed)
 From MyChart:  Hi.  This is Andrea [name]. 06/15/1951. I am on my second dose of parnate and I am extremely tired and very weak.  Please let me know what to do.   I am sleepy and my legs feel Like I have cinder block on my feet.  Please answer me back.  I am not doing good.

## 2024-08-12 NOTE — Telephone Encounter (Signed)
Reviewed recommendations with the patient.

## 2024-08-12 NOTE — Telephone Encounter (Signed)
 Of course she just started this and it is an AD so will take a few weeks to help probably.  If she thinks it is making her tired, as opposed to the dep makin her tired, then try switching it from morning to evening and see if that helps.  Also have her check BP sitting and standing and let us  know the results.

## 2024-08-13 ENCOUNTER — Ambulatory Visit
Admission: EM | Admit: 2024-08-13 | Discharge: 2024-08-13 | Disposition: A | Discharge status: Home / Self Care | Attending: Emergency Medicine | Admitting: Emergency Medicine

## 2024-08-13 ENCOUNTER — Telehealth: Payer: Self-pay

## 2024-08-13 DIAGNOSIS — R300 Dysuria: Secondary | ICD-10-CM | POA: Insufficient documentation

## 2024-08-13 LAB — POCT URINE DIPSTICK
Bilirubin, UA: NEGATIVE
Glucose, UA: NEGATIVE mg/dL
Ketones, POC UA: NEGATIVE mg/dL
Nitrite, UA: NEGATIVE
POC PROTEIN,UA: 30 — AB
Spec Grav, UA: <=1.005 — AB (ref 1.010–1.025)
Urobilinogen, UA: 0.2 U/dL
pH, UA: 6 (ref 5.0–8.0)

## 2024-08-13 MED ORDER — CEPHALEXIN 500 MG PO CAPS: 500.0000 mg | ORAL_CAPSULE | Freq: Three times a day (TID) | ORAL | 0 refills | Status: AC

## 2024-08-13 MED ORDER — CEPHALEXIN 500 MG PO CAPS: 500.0000 mg | ORAL_CAPSULE | Freq: Three times a day (TID) | ORAL | 0 refills | Status: DC

## 2024-08-13 NOTE — Telephone Encounter (Signed)
Sent response via MyChart.

## 2024-08-13 NOTE — ED Triage Notes (Signed)
 Patient to Urgent Care with complaints of urinary frequency/ urgency.  Symptoms x2 days.

## 2024-08-13 NOTE — Discharge Instructions (Addendum)
Take the antibiotic as directed.  The urine culture is pending.  We will call you if it shows the need to change or discontinue your antibiotic.    Follow up with your primary care provider.    
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
## 2024-08-13 NOTE — Telephone Encounter (Signed)
 Noted.  Thank you.  So she is not orthostatic and can proceed with med plan without changes

## 2024-08-13 NOTE — Telephone Encounter (Signed)
 Sitting. 123/79. Pulse 93. Standing 125/86 Pulse 99

## 2024-08-15 ENCOUNTER — Encounter: Payer: Self-pay | Admitting: Psychiatry

## 2024-08-15 ENCOUNTER — Ambulatory Visit: Admitting: Psychiatry

## 2024-08-15 ENCOUNTER — Ambulatory Visit (HOSPITAL_COMMUNITY): Payer: Self-pay

## 2024-08-15 VITALS — BP 119/81 | HR 94

## 2024-08-15 DIAGNOSIS — R251 Tremor, unspecified: Secondary | ICD-10-CM

## 2024-08-15 DIAGNOSIS — F339 Major depressive disorder, recurrent, unspecified: Secondary | ICD-10-CM

## 2024-08-15 DIAGNOSIS — F5105 Insomnia due to other mental disorder: Secondary | ICD-10-CM

## 2024-08-15 DIAGNOSIS — F411 Generalized anxiety disorder: Secondary | ICD-10-CM

## 2024-08-15 LAB — URINE CULTURE: Culture: NO GROWTH

## 2024-08-15 MED ORDER — OLANZAPINE 2.5 MG PO TABS: 2.5000 mg | ORAL_TABLET | Freq: Every day | ORAL | 0 refills | Status: DC

## 2024-08-15 NOTE — Progress Notes (Signed)
 Andrea Andrea [name] [id number] 1951-03-02 73 y.o.   Subjective:   Patient ID:  Andrea Andrea [name] is a 73 y.o. (DOB 03-01-1951) female.  Chief Complaint:  Chief Complaint  Patient presents with   Follow-up   Depression   Anxiety   Medication Reaction    HPI Andrea Andrea [name] presents to the office today for follow-up of MDE and anxiety disorder.  seen 09/2019.  No meds were changed.  Been on Lexapro  20 since early 2017.  02/27/2020 phone call from patient: Pt experiencing more anxiety than she normally has. Pt would like to know what are some options that she has as far as her medication. Pt will be in an appt  MD response: If the anxiety is just occasional then using alprazolam  as needed would be an okay thing to do.  If it is been fairly persistent for a couple of weeks or more than the simplest solution would be to increase the Lexapro  to 1-1/2 tablets daily.  Historically she has done well on Lexapro  20 mg daily for an extended period of time.  If she is under temporary stress then once that is resolved we could drop it back to 1 daily.  If this does not work as expected then she should schedule an earlier appointment. Pt response: Patient called back and she's been having temporary stress, but it's not bad. Things are great, she's working out and feeling okay for the most part. It's been going on for a bit so she agrees to the increase Lexapro  20 mg 1.5 tablets daily. She has been taking alprazolam  at hs occasionally to help sleep better and it does help.   12/06/2020 appointment with the following noted:  No covid.  H Covid and recovered.   Had increased Lexapro  to 30 mg for 90 days and saw a difference but started seeing weight gain and was doing oK and able to drop back and been OK. Still renovating a house for a year.  Sold her house and mourns the house. Calmed down.  Kept 3 gkids and dogs 10 days.   Andrea Andrea [name] Hopes for new house May 1. Rare alprazolam  for HS. Stress moving to condo and would have  crying spell over the move and some problems she was running into dealing with it.  Hard with change.  But taking time to adjust to the idea of moving from her house of 23 years.  Initially refused but he left it up to her and she's come to peace about it.  Andrea Andrea [name].   Good response to medication and no SE.  Exercising is good medicine.  If doesn't then doesn't feel as good. Satisfied. Plan: no med changes  08/01/21  appt noted:  seen with D Andrea Andrea [name] died 08-05-2024.  A Andrea [name] of ups and downs per D.  Hard dips. Got worse when retired and dealing with the new  house.  Had trouble getting rid of things from the old house causing regrets over sellling the house.  D notes doesn't do well with instability  and stressors.  Ruminates on past mistakes. Blamed Andrea [name] for the  difficulty renovating the house.  Neg self talk and beats herself up. When not enough sleep will get depressed.  Does better if busy.   Dreads things.  Patient denies difficulty with sleep initiation or maintenance. Denies appetite disturbance.  Patient Andrea [name] that energy and motivation have been good. Patient denies any difficulty with concentration.  Patient denies any suicidal ideation.  Plan: Abilify  2.5 mg daily for a week, then 5 mg daily. Continue Lexapro  20 mg daily  08/31/2021 appt noted: It worked immediately.  Insomnia with EMA. Normally 9 hours.  It's like I'm high.  Not tired.  Average 3-4 hours. Not depressed and no crazy negative thoughts.  Good response.  Energetic but up in middle of night. Plan: Okay to stop Abilify  because of insomnia.   09/29/2021 appointment with the following noted: Parties were great.  Sleep problem resolved.  A Andrea [name] of rest at the beach. Maybe the last week or 2 less motivation and socialization.  Not laying in the bed.  Just not as good as she was.  Not as outgoing as normal. No alcohol now.  Only had 1-2 at night.  Not ruminating.   Willing to restart Abilify  at lower dose 2 mg daily. 3  grandsons  Plan: Relapse depression recurs she can resume Abilify   but lower dose 2 mg daily or every other day to try to avoid the insomnia where she can contact our office and we can discussed the possibility of an alternative antidepressant such as Trintellix  or duloxetine .  12/28/2021 appointment noted: Several phone calls since she was here.  Abilify  plus Lexapro  failed.  Switch to duloxetine  90 mg which she started 12/02/2018 2023 Started lamotrigine . Also started Rexulti  Sister has had cycling depression that has responded well to lamotrigine  with poor response to SSRIs [name] with a little dementia. SE tremor 90 mg duloxetine , ? Wt gain over a few weeks.. Exercise and wt control Social isolation, low self esteem, worry about the way she looks and not usually that way. Crying better with duloxetine .  More dependent on H.   Anhedonia.  Black hole.  Never this low. No SI Having to use Xanax .   Asks about day treatment and TMS Plan: rec TMS Continue lamotrigine  as prescribed Increase Rexulti  to 1 mg daily Switch to Trintellix :  reduce duloxetine  to 2 of the 30 mg capsules and start Trintellix  5 mg daily for 5 days,  Then Increase Trintellix  to 10 mg daily and reduce duloxetine  to 1 capsule for 1 week. Then stop duloxetine .  02/15/22 appt noted: On Trintellix  , Rexulti  1 and lamotrigine  I feel good.  Walks 3 miles daily is great for mental health. TMS would not be covered by [facility]. Started feeling better after a week or 2 after the last visit. SE appetite increased. No nausea. Not crying anymore. Sleeping well and no longer ruminates. Plan: Continue Trintellix  but DC Rexulti  due to weight gain.  Continue lamotrigine   03/06/2022 appointment with the following noted: Last 2 weeks has been very sad and nervous. Trintellix  was increased to 20 mg daily  03/10/2022 complaining of ongoing depression, feeling jittery, negative thinking, early morning awakening, ruminating about past decisions.   Wanting to consider TMS because she is desperate for improvement. Plan we will schedule urgent appointment  03/15/22 urgent appt noted:  seen with Andrea Andrea [name] Very depressed, worst ever.  Don't want to be alone, ongoing worry, ruminating on past decisions.  Racing negative thoughts.  No motivation.  Trouble staying asleep.  Getting 4 hours and used to 9 hours. Dread getting up. Using Xanax  Increased Trintellix  to 20 mg 9 days ago. H agrees sleep is critical. Isolating.   Need to be better by July 11. Plan: Clonazepam  1 mg HS. Continue trintellix  20 mg daily she is only been on this dose 9 days and it needs more time. Re lamotrigine  and increase  to 150 mg daily..    Andrea Andrea [name]  1.5 mg every other day Option TMS  03/24/22 appt noted: So much better.  Thinking straighter and not negative and not sad.  Sleeping better 8-9 hours.. No crying. H notices progressively better every day.  Mind clarity is much better.   No SE noted.  No hangover.  No nausea. Found a therapist, Andrea Andrea [name].   Feels well enough to go to [place] and kind of excited.  Back July 16.   Plan: Clonazepam  1 mg HS. This resolved insomnia without SE Continue trintellix  20 mg daily she is only been on this dose 20 days and it needs more time. Re lamotriginecontinue 150 mg daily..    Andrea Andrea [name]  1.5 mg every other day Option TMS  04/12/22 appt noted: Great.  Went to [place].  Was herself and enjoyed it.  Anxiety was managed.   [facility] and liked her. 3# wt gain.   Patient Andrea [name] stable mood and denies depressed or irritable moods.  Patient denies any recent difficulty with anxiety.  Patient denies difficulty with sleep initiation or maintenance. Denies appetite disturbance.  Patient Andrea [name] that energy and motivation have been good.  Patient denies any difficulty with concentration.  Patient denies any suicidal ideation. Sleeping well than not anxious. Plan: Trintellix  20  07/10/22 TC depression returned  recently.  08/15/22 appt noted: Still blunted  and diminished motivation.  Some days better than others.  Going to gym.  Partially better.   Still has a Andrea [name] of anxiety and doesn't want to be alone. Functioning but not normal.  Dreads parties.   SE tremor for a couple of week.sleep great.  Dep 7/10. Current Andrea Andrea [name]  1.5 mg daily, increase lamotrigine  200 mg daily, Trintellix  20 , clonazepam  0.5 mg HS Plan: Reduce Clonazepam  1/2  mg HS. This resolved insomnia without SE Continue trintellix  20 mg daily only mildly effective. lamotrigine  continue 200 mg daily..    DC Andrea Andrea [name]  DT lost response Auvelity  Stop Andrea Andrea [name]  After Thanksgiving stop Trintellix  Wait 3 days then Start Auvelity  1 in the morning for 1 week,  and if no side effects then increase Auvelity  to 1 in the AM and 1 in the PM  09/04/22 TC: Complaining of persistent depression and wanting to do something else to help.  Added lithium  CR 300 mg nightly  09/11/2022 phone call complaining of no improvement with Auvelity  and lithium .  Appointment was moved up.  09/13/22 appt noted: Nothing changed. No better and no worse.  Awakens sad and tearful.  Sleeps well. Pushing herself to the gym.  Has a trainer. On clonazepam  0.5 mg HS She remains anxious when alone for no apparent reason.  Feelings of inferiority.  She is normally extroverted but now she is wanting to isolate from people.  All tasks seem monumental.  No enjoyment and not looking forward to anything.  Everything is a personal assistant.  She remains generally worried and anxious which is not typical for her. Plan: Clonazepam  1/2  mg HS. This resolved insomnia without SE Stop Auvelity  Start nortriptyline  1 of the 25 mg capsules at night for 4 nights then 2 at night for 4 nights then 3 at night, then wait 1 week and get the blood test in the morning at [facility] Reduce lamotrigine  to 1 and 1/2 tablets daily  Lamotrigine  has not been helpful to him 100 mg daily.  Start reducing lamotrigine  150 mg  daily and will continue to taper at next appointment.  09/27/21 urgent appt :  she wanted urgent appt bc desperate to feel better.  Seen with H Called at least twice since here for the same reasons of depression. Not sleeping well even with clonazepam  1 mg HS. Doesn't want to be alone. Shakey.   On lithium  300 mg daily, nortriptyline  75 mg HS.   Doesn't want to live like this but not acutely suicidal. Tolerating meds. Plan: pursue [facility] protocol TMS at [facility] bc faster optin than alternatives  10/30/22 TC: finished 50 TMS tx in the week and wants appt ASAP bc still struggling with crying and depresssion..  11/03/22 urgent appt : Completed TMS last week. Tue-Thur better days and then Friday nose-dived. Not done well since got back.   H notes high anxiety and crying in AM and PM and doesn't want to be alone.  Xanxax seems to help. Xanax  helps and taking 0.5 mg AM and 1 mg HS H notes memory is sharper and she's more alert after treatment. Very anxioius all the time.  Get up sad.  Exercising. Sleep good with Xanax  1 mg HS. Plan: Increase Lexapro  20 mg dailly (should help crying and maybe anxiety but unlikely to resolve depression) Increase alprazolam  to 0.5 mg TID and 1 mg HS for severe anxiety. H notes it helps. Pramipexole  0.25 BID for 5 days and if NR then 0.5 mg Bid off label. Reduce lamotrigine  100 mg for 2 weeks then 50 mg daily for 2 weeks then stop it. She wants to pursue Spravato  if pramipexole  fails  11/17/22 emergency work in appt: Pramipexole  was not sent in and MD not aware until yesterday.  She is not any better and is desperate for a med change. Sleeping well and taking Xanax  1 mg HS Mornings are worse.  Not crying much.  Last Friday was a bad day and she didn't want to be alone.  But did let her H play golf once this week. Reduced lamotrigine  today to 50 BID and weaning off. H says better this week than last week. Increased Lexapro  to 20 mg daily. Plan: Increased Lexapro  20  mg dailly (helped crying and maybe anxiety but unlikely to resolve depression) Continue alprazolam  to 0.5 mg TID prn anxiety and 1 mg HS. H notes it helps. Pramipexole  0.25 mg BID for 3 days then 0.5 mg BID Reduce lamotrigine  50 mg daily for 2 weeks then stop it. She wants to pursue Spravato  if pramipexole  fails  12/25/22 appt urgently. COMPLETED 1 WEEK TMS [facility] without benefit except TMS took away brain fog.   She feels need to take Xanax  0.5 mg TID  Some sleepiness with Xanax  . Mornings are better and fine while at the gym but when home gets depressed again.   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 Needs Xanax  to do social things. Plan: Increased Lexapro  20 mg dailly (helped crying and maybe anxiety but unlikely to resolve depression) Continue alprazolam  to 0.5 mg TID prn anxiety and 1 mg HS. H notes it helps. Change pramipexole  0.5mg  tablets, 1 tablet four times daily for 1 week,  Then if not better 1and 1/2 tablets in the AM and dinner and 1 tablet at lunch and dinner.    4/9-4/15 hosp for ruptured appendix and missed meds: Can restart Lexapro  at 1/2 tablet daily for 3 days then 1 daily. Restart 1/2 pramipexole  tablet 3 times daily for 3 days then 1 tablet 3 times daily for 3 days then 1 and 1/2 tablet 3 times daily.      01/12/23 appt noted: No  GI sx now.  No appetite but working on protein.   I think I'm doing real well mentally.  Has resumed meds.  Mood has been great. Pramipexole  0.5mg   1 and 1/2 TID Lexapro  20 Xanax  reduced to 1 mg HS H sees a difference.   No SE with meds.   No med changes  01/25/23 appt noted: Meds:  pramipexole  0.75 mg TID, Xanax  1.5 mg HS and 0.5 mg prn, Lexapro  20 mg daily. Doing great.  Staples removed and healing from surgery appendectomy. Back in the gym helps her.   Satisfied with meds. Getting out socially and feeling like her old self.  Andrea's H Andrea Andrea [name] went to ER vomiting blood.   Plan: no med changes  02/07/23 appt noted: Back from [facility]  yesterday.  Had ear infection.  Then conjunctivitis.  Eyes hurt.   Before the surgery was getting better and feels like she is sliding back some.  Gradually a little worse since here.  Wonders if she could get better with joy and social drive.   Off Abx .  Sleep is good with meds.  With depression then gets anxious too.  Is functioning.   Meds:  pramipexole  0.75 mg TID, Xanax  1.5 mg HS and 0.5 mg prn, Lexapro  20 mg daily. Consistent and no SE. Plan: Continue Lexapro  20 mg dailly (helped crying and maybe anxiety but unlikely to resolve depression) Continue alprazolam   1 mg HS. H notes it helps. Increase pramipexole  0.5mg  tablets, 2 tablets 3 times daily  01/31/23 TC:Con Andrea Andrea [name], CMA  to Me     02/20/23  4:56 PM Note Patient reporting she is not feeling as good as she thinks she should. Her anxiety is the biggest concern. She said she can go to the gym in the mornings and does ok, but she had a closing to go to recently and had to take 1/4 of a Xanax , Andrea [name] it took the edge off. She Andrea [name] daily tremors that are helped by Xanax . She is not crying, she is getting things done, sleeping okay. Will have her grandchildren next week and she isn't excited about it like she should be.    Taking 6 mirapex , 1.5 Xanax  and Lexapro  20 mg.     MD resp:  CC   02/20/23  5:48 PM Note We just increased the pramipexole  recently and I see her next week.  I do not want to increase the medicine any further until I see her again.      03/02/23 urgent appt noted: Some improvement with incr pramipexole  1 mg TID. Before felt worse in AM now feels better in AM and goes to gym. Otherwise no excitement.  Hard to make decisions. Less social and smiling.  Subdued more than normal.  Not confident.  Not enjoying present and ruminates in past.   No SE.  No impulsivity Has tremor for awhile .  Is mild for a month. Hard to conc.  Brain fog was better with TMS but is back. Continue Lexapro  20 mg dailly (helped crying  and maybe anxiety but unlikely to resolve depression) Continue alprazolam   1 mg HS. H notes it helps. Increase pramipexole  1 mg QID times daily  03/16/23 appt noted: Meds as above SE: If not enough food makes her excitable, urgent, talking fast.  Does it a bit too much.  Energy fine.   Feels better in the AM at the gym and when home a dark shadow comes over her.   Still feels dep overall.  Went to work with friends and didn't feel natural and didn't enjoy it like she should.  Pushes herself to stay active.   Takes 1.5 mg Xanax  HS and then awakens and takes another 0.5 mg HS but gets enough sleep. A Andrea [name] of brain fog.  Feels inferior and doesn't usually feel that way. Infrequent crying spells. Plan:  Continue Lexapro  20 mg dailly (helped crying and maybe anxiety but unlikely to resolve depression) Continue alprazolam   1 mg HS. H notes it helps. Reduce pramipexole  1 mg TID for 3 days, then 0.5 mg QID for 3 days, then 0.5 mg BID for 3 days, then 0.25 mg BID for 3 days, then stop it. Now start Latuda  (lurasidone ) 40 mg 1/2 tablet for 1 week.  If not benefit then 1 tablet daily with 350 calories  04/13/23 urgent appt note: with [facility] 04/05/23 for dep with SI Worst I've ever been.  Feels out of control.  Shaking .  Can't sleep.  Feels disconnected.   Hated being hosp bc locked up.   Taking quetiapine  100 hS and lorazepam and still can't sleep. Started sertraline  50 and stopped Lexapro . Quetiapine  100 mg HS Plan: Stop hydroxyzine Increase quetiapine  200 then 300 mg at night for sleep and depression  Stop lorazepam and return to alprazolam  1 mg at night for sleep Increase sertraline  to 100 mg daily or 2 of 50 mg nightly  04/23/23 appt noted: Here for first appt with Spravato .  Highly anxious about getting Spravato  today.  Doesn't like feeling out of control and worries over this. Still very depressed.  Doesn't think her sx as noted are due to depression.  Thinks she has a neuro problem.  But she  has no other neuro sx like numbness, weakness, balance px, or anything other than dep and anxiety sx. Received Spravato  56 mg today.  It was horrible! Bc she felt out of control and was anxious receiving it.  Thought it would last forever re: dissociation.  It was scary to her.  Couldn't relax for it.  No N, V, HA.  Ambivalent about continuing Spravato  but open. No specific concerns with meds.    04/25/23 appt noted: Needed Xanax  before visit today to overcome fear of coming to the appt. She was more disciplined about controlling neg thought during Spravato  admin and had a much better experience.  It was not scary nor associated with sig fear.  She is still dep and anxious without change otherwise since seen a couple of days ago. Tolerating med changes from last week.   Received Spravato  56 mg again and tolerated it without adverse SE.  Typical SE dissociation, no NV, palpitations, HA.  Motivated to continue the Spravato  and agrees to increase it.  Better experience than the first time. Plan: Increase quetiapine  to 400 mg HS  04/26/23 TC:  Patient taking 300 mg of Seroquel  and 1 mg of Xanax  for sleep. She can get to sleep, just can't stay asleep. Doesn't say how long she is sleeping, but says she needs 9 hours of sleep. Reporting social isolation is bad, she won't leave the house, but doesn't like being home alone.  Andrea [name] she has never been this sick mentally and feels like she may need to go to the hospital. No SI, just doesn't like being alone.     MD resp:  Pt just seen yesterday and received 2nd Spravato  56 mg .  That was positive experience. However she is still severely depressed and ruminating and reassurance  seeking..  She doesn't need to go to the hospital. She told me yesterday she slept 7 hours but needs 9 hours.  7 hours is not bad.   She is taking quetiapne 300 mg HS with Xanax  1 mg HS.  No increase in Xanax  but can increase quetiapine  to 400 mg HS (max dose 800) and this could help  sleep and anxiety and depression. She should keep appt next week for next Spravato .  In case she asks, Spravato  is not making her worse in any way.     05/01/23 appt noted;H present also Current meds; quetiapine  400 mg HS, sertraline  100 mg daily, alprazolam  0.5 mg TID and 1 mg HS. Tolerating med changes.    Received Spravato  84 mg first time.  Typical SE dissociation, no NV, palpitations, HA.  However she had a bad experience DT severe anxiety before starting the Spravato .   Very fearful, scared.  Without specific reasons.  Severe anxiety dep, hopeless.  No SI but doesn't want to live like this.  So anxious hard to sleep and some crying spells. Seroquel  not helping sleep.  05/03/23 appt noted: Current meds; quetiapine  400 mg HS, sertraline  100 mg daily, alprazolam  0.5 mg TID and 1 mg HS. Tolerating med changes.    Received Spravato  84 mg.  Typical SE dissociation, no NV, palpitations, HA.  However she had a bad experience DT severe anxiety before starting the Spravato .   Highly anxious, fearful of everything including Spravato  admin.  Afraid she will forget who she is despite having it before.  Still anxious and trouble sleeping at home.  Sleep no better with quetiapine  and still ruminatiing.   Plan increase quetiapine  to 500 mg HS  05/08/23 appt noted: Meds: quetiapine  500 mg HS, sertraline  100 mg daily, alprazolam  0.5 mg TID and 1 mg HS. Received Spravato  84 mg 3rd time.  Typical SE dissociation, no NV, palpitations, HA.  However she had a bad experience DT severe anxiety before starting the Spravato .   She is not better so far.  Ongoing severe depression anxiety, avoidance, anhedonia, rumination, afraid to be alone. All not typical of herself.   No SE with meds except constipation and sweating at night.  Plan:  Increase quetiapine  to 600 mg at night for sleep and depression & rumination and next week to 600 HS increased alprazolam  1 mg at night for sleep and 0.5 mg TID Increase sertraline   150 mg daily  Spravato  84 mg AM twice weekly for severe TRD  05/10/23 appt noted:  Meds: quetiapine  600 mg HS, sertraline  150 mg daily, alprazolam  0.5 mg TID and 1 mg HS. Received Spravato  84 mg 4th time.  Typical SE dissociation, no NV, palpitations, HA.  However she had a bad experience DT severe anxiety before starting the Spravato .   She is not better so far.  Ongoing severe depression anxiety, avoidance, anhedonia, rumination, afraid to be alone. All not typical of herself.   SE constipation she finds unmanageable  Did sleep better with increased quetiapine  600 mg Hs but cannot continue dT constipation.  However dep and anxiety are no better with Spravato  or the other treatments except better sleep.  Anxiety ongoing severe with rumination.  05/17/23 appt noted:  Meds: switched to olanzapine  15 mg HS, sertraline  150, alprazolam   Tremor seems worse but H disagrees. Mood continually worse.  She is dependent on H and afraid to be alone.  No sweating. Xanax  helped but wears off after a couple of hours.  She is  open to antoher option Tolerating med changes. No improvement except slept a little better last night but still not enough. No Spravato  today bc 2 weeks of it didn't help.  Agrees to pursue ECT but hoping med changes will help Crying spells.  07/10/23 appt noted: Great.  Andrea Andrea [name] back at work.  Playing golf again.   Med: sertraline  200, propranolol  10 BID, olanzapine  15 pm, clonazepam  1 mg HS Did not require ECT.  All of a sudden something clicked and gradually better .  Doesn't even recognize the person she was with depression.   No negative thoughts.  Laughing again.  More social and talking again.  Happy in the am.  Sleep very well 10-11 hours.  Everything back to normal .  Doesn't need Andrea Andrea [name] with her now.  Ok being alone.  Anxiety gone also. SE carb craving and wt gain 10#.   Plan: Propranolol  10 mg tablet 2-3 tablets twice daily as needed for tremor Continue clonazepam  1 mg tablet  1/2 twice daily and 1 tablet at night Continue sertraline  200 tablets daily. Switch olanzapine  15 to Lybalvi 10 DT wt gain.    08/10/23 appt noted: Psych med: sertraline  200, propranolol  10 BID, Lybalvi 10 pm, clonazepam  1 mg HS Up to 141# with Lybalvi.  Went to wt reducing clinic and got shot semaglutide.  $75/ week.  That took appetite away.   Was a little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 review with patient.  Poor folate metabolism.  Otherwise unremarkable. Deplin 15 mg daily with samples.   Disc criteria for hosp and option if gets any worse.  Doing online counseling.  Not much change.   2-4 stool softeners daily with Miralax daily  Use alprazolam  0.5 mg at night if needed for sleep  Rec second opinion for TRD consultation.  She is planning to get at [facility].    Plan:  Will continue olanzapine  and Seroquel  for now in  hopes of buffering mood from the expected worsening while weaning off sertraline .  Also bc she felt worse trying to reduce either of these meds.  Increase Parnate  gradually to 40 mg and if needed higher.  Once seeing benefit will reduce olanzapine  to 2.5 mg daily.  FU 4 wks  Lorene Macintosh, MD, DFAPA     Future Appointments  Date Time Provider Department Center  09/12/2024  3:30 PM Cottle, Lorene KANDICE Raddle., MD [facility] None  09/23/2024  2:20 PM [facility] ANNUAL WELLNESS VISIT 1 [facility] [address]  10/14/2024  2:30 PM Cottle, Lorene KANDICE Raddle., MD [facility] None       No orders of the defined types were placed in this encounter.      -------------------------------me

## 2024-08-15 NOTE — Patient Instructions (Signed)
 Increase Tranycypramine to 40 mg  as planned.  Once you start significantly feeling less depressed, then reduce olanzapine  to 2.5 mg nightly.  You have written RX

## 2024-08-25 DIAGNOSIS — N898 Other specified noninflammatory disorders of vagina: Secondary | ICD-10-CM | POA: Diagnosis not present

## 2024-08-25 DIAGNOSIS — R319 Hematuria, unspecified: Secondary | ICD-10-CM | POA: Diagnosis not present

## 2024-08-25 DIAGNOSIS — R300 Dysuria: Secondary | ICD-10-CM | POA: Diagnosis not present

## 2024-08-27 ENCOUNTER — Other Ambulatory Visit: Payer: Self-pay

## 2024-08-27 DIAGNOSIS — F5105 Insomnia due to other mental disorder: Secondary | ICD-10-CM

## 2024-08-27 MED ORDER — ALPRAZOLAM 0.5 MG PO TABS: ORAL_TABLET | ORAL | 0 refills | Status: DC

## 2024-08-28 DIAGNOSIS — E039 Hypothyroidism, unspecified: Secondary | ICD-10-CM | POA: Diagnosis not present

## 2024-08-28 DIAGNOSIS — M810 Age-related osteoporosis without current pathological fracture: Secondary | ICD-10-CM | POA: Diagnosis not present

## 2024-08-28 DIAGNOSIS — R5383 Other fatigue: Secondary | ICD-10-CM | POA: Diagnosis not present

## 2024-08-28 DIAGNOSIS — I872 Venous insufficiency (chronic) (peripheral): Secondary | ICD-10-CM | POA: Diagnosis not present

## 2024-08-28 DIAGNOSIS — R251 Tremor, unspecified: Secondary | ICD-10-CM | POA: Diagnosis not present

## 2024-08-28 DIAGNOSIS — F329 Major depressive disorder, single episode, unspecified: Secondary | ICD-10-CM | POA: Diagnosis not present

## 2024-08-30 ENCOUNTER — Other Ambulatory Visit: Payer: Self-pay | Admitting: Psychiatry

## 2024-08-30 DIAGNOSIS — F339 Major depressive disorder, recurrent, unspecified: Secondary | ICD-10-CM

## 2024-09-04 ENCOUNTER — Other Ambulatory Visit: Payer: Self-pay | Admitting: Psychiatry

## 2024-09-08 DIAGNOSIS — R319 Hematuria, unspecified: Secondary | ICD-10-CM | POA: Diagnosis not present

## 2024-09-08 DIAGNOSIS — R3915 Urgency of urination: Secondary | ICD-10-CM | POA: Diagnosis not present

## 2024-09-09 DIAGNOSIS — M810 Age-related osteoporosis without current pathological fracture: Secondary | ICD-10-CM | POA: Diagnosis not present

## 2024-09-12 ENCOUNTER — Encounter: Payer: Self-pay | Admitting: Psychiatry

## 2024-09-12 ENCOUNTER — Ambulatory Visit: Admitting: Psychiatry

## 2024-09-12 VITALS — BP 126/81 | HR 96

## 2024-09-12 DIAGNOSIS — R251 Tremor, unspecified: Secondary | ICD-10-CM | POA: Diagnosis not present

## 2024-09-12 DIAGNOSIS — F339 Major depressive disorder, recurrent, unspecified: Secondary | ICD-10-CM | POA: Diagnosis not present

## 2024-09-12 DIAGNOSIS — F411 Generalized anxiety disorder: Secondary | ICD-10-CM

## 2024-09-12 NOTE — Patient Instructions (Signed)
 Stop olanzapine  Reduce quetiapine  to 1 and 1/2 tablets at night. About Jan 7 reduce quetiapine  to 1 tablet at night

## 2024-09-12 NOTE — Progress Notes (Signed)
 [name] [id number] 07-15-1951 73 y.o.   Subjective:   Patient ID:  Andrea [name] is a 73 y.o. (DOB 1951/05/20) female.  Chief Complaint:  Chief Complaint  Patient presents with   Follow-up   Depression   Anxiety   Medication Problem    HPI Andrea SCHMELING presents to the office today for follow-up of MDE and anxiety disorder.  seen 09/2019.  No meds were changed.  Been on Lexapro  20 since early 2017.  02/27/2020 phone call from patient: Pt experiencing more anxiety than she normally has. Pt would like to know what are some options that she has as far as her medication. Pt will be in an appt  MD response: If the anxiety is just occasional then using alprazolam  as needed would be an okay thing to do.  If it is been fairly persistent for a couple of weeks or more than the simplest solution would be to increase the Lexapro  to 1-1/2 tablets daily.  Historically she has done well on Lexapro  20 mg daily for an extended period of time.  If she is under temporary stress then once that is resolved we could drop it back to 1 daily.  If this does not work as expected then she should schedule an earlier appointment. Pt response: Patient called back and she's been having temporary stress, but it's not bad. Things are great, she's working out and feeling okay for the most part. It's been going on for a bit so she agrees to the increase Lexapro  20 mg 1.5 tablets daily. She has been taking alprazolam  at hs occasionally to help sleep better and it does help.   12/06/2020 appointment with the following noted:  No covid.  Andrea [name] Covid and recovered.   Had increased Lexapro  to 30 mg for 90 days and saw a difference but started seeing weight gain and was doing oK and able to drop back and been OK. Still renovating a house for a year.  Sold her house and mourns the house. Calmed down.  Kept 3 gkids and dogs 10 days.   VEAR Collum Hopes for new house May 1. Rare alprazolam  for HS. Stress moving to condo and would have  crying spell over the move and some problems she was running into dealing with it.  Hard with change.  But taking time to adjust to the idea of moving from her house of 23 years.  Initially refused but he left it up to her and she's come to peace about it.  VEAR Collum.   Good response to medication and no SE.  Exercising is good medicine.  If doesn't then doesn't feel as good. Satisfied. Plan: no med changes  08/01/21  appt noted:  seen with D Andrea [name] died 2024-08-04.  A lot of ups and downs per D.  Hard dips. Got worse when retired and dealing with the new  house.  Had trouble getting rid of things from the old house causing regrets over sellling the house.  D notes doesn't do well with instability  and stressors.  Ruminates on past mistakes. Blamed Collum for the  difficulty renovating the house.  Neg self talk and beats herself up. When not enough sleep will get depressed.  Does better if busy.   Dreads things.  Patient denies difficulty with sleep initiation or maintenance. Denies appetite disturbance.  Patient reports that energy and motivation have been good. Patient denies any difficulty with concentration.  Patient denies any suicidal ideation.  Plan: Abilify  2.5 mg daily for a week, then 5 mg daily. Continue Lexapro  20 mg daily  08/31/2021 appt noted: It worked immediately.  Insomnia with EMA. Normally 9 hours.  It's like I'm high.  Not tired.  Average 3-4 hours. Not depressed and no crazy negative thoughts.  Good response.  Energetic but up in middle of night. Plan: Okay to stop Abilify  because of insomnia.   09/29/2021 appointment with the following noted: Parties were great.  Sleep problem resolved.  A lot of rest at the beach. Maybe the last week or 2 less motivation and socialization.  Not laying in the bed.  Just not as good as she was.  Not as outgoing as normal. No alcohol now.  Only had 1-2 at night.  Not ruminating.   Willing to restart Abilify  at lower dose 2 mg daily. 3  grandsons  Plan: Relapse depression recurs she can resume Abilify   but lower dose 2 mg daily or every other day to try to avoid the insomnia where she can contact our office and we can discussed the possibility of an alternative antidepressant such as Trintellix  or duloxetine .  12/28/2021 appointment noted: Several phone calls since she was here.  Abilify  plus Lexapro  failed.  Switch to duloxetine  90 mg which she started 12/02/2018 2023 Started lamotrigine . Also started Rexulti  Sister has had cycling depression that has responded well to lamotrigine  with poor response to SSRIs [name] with a little dementia. SE tremor 90 mg duloxetine , ? Wt gain over a few weeks.. Exercise and wt control Social isolation, low self esteem, worry about the way she looks and not usually that way. Crying better with duloxetine .  More dependent on Andrea [name].   Anhedonia.  Black hole.  Never this low. No SI Having to use Xanax .   Asks about day treatment and TMS Plan: rec TMS Continue lamotrigine  as prescribed Increase Rexulti  to 1 mg daily Switch to Trintellix :  reduce duloxetine  to 2 of the 30 mg capsules and start Trintellix  5 mg daily for 5 days,  Then Increase Trintellix  to 10 mg daily and reduce duloxetine  to 1 capsule for 1 week. Then stop duloxetine .  02/15/22 appt noted: On Trintellix  , Rexulti  1 and lamotrigine  I feel good.  Walks 3 miles daily is great for mental health. TMS would not be covered by [facility]. Started feeling better after a week or 2 after the last visit. SE appetite increased. No nausea. Not crying anymore. Sleeping well and no longer ruminates. Plan: Continue Trintellix  but DC Rexulti  due to weight gain.  Continue lamotrigine   03/06/2022 appointment with the following noted: Last 2 weeks has been very sad and nervous. Trintellix  was increased to 20 mg daily  03/10/2022 complaining of ongoing depression, feeling jittery, negative thinking, early morning awakening, ruminating about past decisions.   Wanting to consider TMS because she is desperate for improvement. Plan we will schedule urgent appointment  03/15/22 urgent appt noted:  seen with VEAR Collum Very depressed, worst ever.  Don't want to be alone, ongoing worry, ruminating on past decisions.  Racing negative thoughts.  No motivation.  Trouble staying asleep.  Getting 4 hours and used to 9 hours. Dread getting up. Using Xanax  Increased Trintellix  to 20 mg 9 days ago. Andrea [name] agrees sleep is critical. Isolating.   Need to be better by July 11. Plan: Clonazepam  1 mg HS. Continue trintellix  20 mg daily she is only been on this dose 9 days and it needs more time. Re lamotrigine  and increase  to 150 mg daily..    Vraylar  1.5 mg every other day Option TMS  03/24/22 appt noted: So much better.  Thinking straighter and not negative and not sad.  Sleeping better 8-9 hours.. No crying. Andrea [name] notices progressively better every day.  Mind clarity is much better.   No SE noted.  No hangover.  No nausea. Found a therapist, Heather Jungling.   Feels well enough to go to [place] and kind of excited.  Back July 16.   Plan: Clonazepam  1 mg HS. This resolved insomnia without SE Continue trintellix  20 mg daily she is only been on this dose 20 days and it needs more time. Re lamotriginecontinue 150 mg daily..    Vraylar  1.5 mg every other day Option TMS  04/12/22 appt noted: Great.  Went to [place].  Was herself and enjoyed it.  Anxiety was managed.   [facility] and liked her. 3# wt gain.   Patient reports stable mood and denies depressed or irritable moods.  Patient denies any recent difficulty with anxiety.  Patient denies difficulty with sleep initiation or maintenance. Denies appetite disturbance.  Patient reports that energy and motivation have been good.  Patient denies any difficulty with concentration.  Patient denies any suicidal ideation. Sleeping well than not anxious. Plan: Trintellix  20  07/10/22 TC depression returned  recently.  08/15/22 appt noted: Still blunted  and diminished motivation.  Some days better than others.  Going to gym.  Partially better.   Still has a lot of anxiety and doesn't want to be alone. Functioning but not normal.  Dreads parties.   SE tremor for a couple of week.sleep great.  Dep 7/10. Current Vraylar  1.5 mg daily, increase lamotrigine  200 mg daily, Trintellix  20 , clonazepam  0.5 mg HS Plan: Reduce Clonazepam  1/2  mg HS. This resolved insomnia without SE Continue trintellix  20 mg daily only mildly effective. lamotrigine  continue 200 mg daily..    DC Vraylar  DT lost response Auvelity  Stop Vraylar  After Thanksgiving stop Trintellix  Wait 3 days then Start Auvelity  1 in the morning for 1 week,  and if no side effects then increase Auvelity  to 1 in the AM and 1 in the PM  09/04/22 TC: Complaining of persistent depression and wanting to do something else to help.  Added lithium  CR 300 mg nightly  09/11/2022 phone call complaining of no improvement with Auvelity  and lithium .  Appointment was moved up.  09/13/22 appt noted: Nothing changed. No better and no worse.  Awakens sad and tearful.  Sleeps well. Pushing herself to the gym.  Has a trainer. On clonazepam  0.5 mg HS She remains anxious when alone for no apparent reason.  Feelings of inferiority.  She is normally extroverted but now she is wanting to isolate from people.  All tasks seem monumental.  No enjoyment and not looking forward to anything.  Everything is a personal assistant.  She remains generally worried and anxious which is not typical for her. Plan: Clonazepam  1/2  mg HS. This resolved insomnia without SE Stop Auvelity  Start nortriptyline  1 of the 25 mg capsules at night for 4 nights then 2 at night for 4 nights then 3 at night, then wait 1 week and get the blood test in the morning at [facility] Reduce lamotrigine  to 1 and 1/2 tablets daily  Lamotrigine  has not been helpful to him 100 mg daily.  Start reducing lamotrigine  150 mg  daily and will continue to taper at next appointment.  09/27/21 urgent appt :  she wanted urgent appt bc desperate to feel better.  Seen with Andrea [name] Called at least twice since here for the same reasons of depression. Not sleeping well even with clonazepam  1 mg HS. Doesn't want to be alone. Shakey.   On lithium  300 mg daily, nortriptyline  75 mg HS.   Doesn't want to live like this but not acutely suicidal. Tolerating meds. Plan: pursue [facility] protocol [facility] at [facility] bc faster optin than alternatives  10/30/22 TC: finished 50 TMS tx in the week and wants appt ASAP bc still struggling with crying and depresssion..  11/03/22 urgent appt : Completed TMS last week. Tue-Thur better days and then Friday nose-dived. Not done well since got back.   Andrea [name] notes high anxiety and crying in AM and PM and doesn't want to be alone.  Xanxax seems to help. Xanax  helps and taking 0.5 mg AM and 1 mg HS Andrea [name] notes memory is sharper and she's more alert after treatment. Very anxioius all the time.  Get up sad.  Exercising. Sleep good with Xanax  1 mg HS. Plan: Increase Lexapro  20 mg dailly (should help crying and maybe anxiety but unlikely to resolve depression) Increase alprazolam  to 0.5 mg TID and 1 mg HS for severe anxiety. Andrea [name] notes it helps. Pramipexole  0.25 BID for 5 days and if NR then 0.5 mg Bid off label. Reduce lamotrigine  100 mg for 2 weeks then 50 mg daily for 2 weeks then stop it. She wants to pursue Spravato  if pramipexole  fails  11/17/22 emergency work in appt: Pramipexole  was not sent in and MD not aware until yesterday.  She is not any better and is desperate for a med change. Sleeping well and taking Xanax  1 mg HS Mornings are worse.  Not crying much.  Last Friday was a bad day and she didn't want to be alone.  But did let her Andrea [name] play golf once this week. Reduced lamotrigine  today to 50 BID and weaning off. Andrea [name] says better this week than last week. Increased Lexapro  to 20 mg daily. Plan: Increased Lexapro  20  mg dailly (helped crying and maybe anxiety but unlikely to resolve depression) Continue alprazolam  to 0.5 mg TID prn anxiety and 1 mg HS. Andrea [name] notes it helps. Pramipexole  0.25 mg BID for 3 days then 0.5 mg BID Reduce lamotrigine  50 mg daily for 2 weeks then stop it. She wants to pursue Spravato  if pramipexole  fails  12/25/22 appt urgently. COMPLETED 1 WEEK TMS [facility] without benefit except TMS took away brain fog.   She feels need to take Xanax  0.5 mg TID  Some sleepiness with Xanax  . Mornings are better and fine while at the gym but when home gets depressed again.   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 Needs Xanax  to do social things. Plan: Increased Lexapro  20 mg dailly (helped crying and maybe anxiety but unlikely to resolve depression) Continue alprazolam  to 0.5 mg TID prn anxiety and 1 mg HS. Andrea [name] notes it helps. Change pramipexole  0.5mg  tablets, 1 tablet four times daily for 1 week,  Then if not better 1and 1/2 tablets in the AM and dinner and 1 tablet at lunch and dinner.    4/9-4/15 hosp for ruptured appendix and missed meds: Can restart Lexapro  at 1/2 tablet daily for 3 days then 1 daily. Restart 1/2 pramipexole  tablet 3 times daily for 3 days then 1 tablet 3 times daily for 3 days then 1 and 1/2 tablet 3 times daily.      01/12/23 appt noted: No  GI sx now.  No appetite but working on protein.   I think I'm doing real well mentally.  Has resumed meds.  Mood has been great. Pramipexole  0.5mg   1 and 1/2 TID Lexapro  20 Xanax  reduced to 1 mg HS Andrea [name] sees a difference.   No SE with meds.   No med changes  01/25/23 appt noted: Meds:  pramipexole  0.75 mg TID, Xanax  1.5 mg HS and 0.5 mg prn, Lexapro  20 mg daily. Doing great.  Staples removed and healing from surgery appendectomy. Back in the gym helps her.   Satisfied with meds. Getting out socially and feeling like her old self.  Andrea's Andrea [name] Brad went to ER vomiting blood.   Plan: no med changes  02/07/23 appt noted: Back from [facility]  yesterday.  Had ear infection.  Then conjunctivitis.  Eyes hurt.   Before the surgery was getting better and feels like she is sliding back some.  Gradually a little worse since here.  Wonders if she could get better with joy and social drive.   Off Abx .  Sleep is good with meds.  With depression then gets anxious too.  Is functioning.   Meds:  pramipexole  0.75 mg TID, Xanax  1.5 mg HS and 0.5 mg prn, Lexapro  20 mg daily. Consistent and no SE. Plan: Continue Lexapro  20 mg dailly (helped crying and maybe anxiety but unlikely to resolve depression) Continue alprazolam   1 mg HS. Andrea [name] notes it helps. Increase pramipexole  0.5mg  tablets, 2 tablets 3 times daily  01/31/23 TC:Con Dawna DASEN, CMA  to Me     02/20/23  4:56 PM Note Patient reporting she is not feeling as good as she thinks she should. Her anxiety is the biggest concern. She said she can go to the gym in the mornings and does ok, but she had a closing to go to recently and had to take 1/4 of a Xanax , reports it took the edge off. She reports daily tremors that are helped by Xanax . She is not crying, she is getting things done, sleeping okay. Will have her grandchildren next week and she isn't excited about it like she should be.    Taking 6 mirapex , 1.5 Xanax  and Lexapro  20 mg.     MD resp:  CC   02/20/23  5:48 PM Note We just increased the pramipexole  recently and I see her next week.  I do not want to increase the medicine any further until I see her again.      03/02/23 urgent appt noted: Some improvement with incr pramipexole  1 mg TID. Before felt worse in AM now feels better in AM and goes to gym. Otherwise no excitement.  Hard to make decisions. Less social and smiling.  Subdued more than normal.  Not confident.  Not enjoying present and ruminates in past.   No SE.  No impulsivity Has tremor for awhile .  Is mild for a month. Hard to conc.  Brain fog was better with TMS but is back. Continue Lexapro  20 mg dailly (helped crying  and maybe anxiety but unlikely to resolve depression) Continue alprazolam   1 mg HS. Andrea [name] notes it helps. Increase pramipexole  1 mg QID times daily  03/16/23 appt noted: Meds as above SE: If not enough food makes her excitable, urgent, talking fast.  Does it a bit too much.  Energy fine.   Feels better in the AM at the gym and when home a dark shadow comes over her.   Still feels dep overall.  Went to work with friends and didn't feel natural and didn't enjoy it like she should.  Pushes herself to stay active.   Takes 1.5 mg Xanax  HS and then awakens and takes another 0.5 mg HS but gets enough sleep. A lot of brain fog.  Feels inferior and doesn't usually feel that way. Infrequent crying spells. Plan:  Continue Lexapro  20 mg dailly (helped crying and maybe anxiety but unlikely to resolve depression) Continue alprazolam   1 mg HS. Andrea [name] notes it helps. Reduce pramipexole  1 mg TID for 3 days, then 0.5 mg QID for 3 days, then 0.5 mg BID for 3 days, then 0.25 mg BID for 3 days, then stop it. Now start Latuda  (lurasidone ) 40 mg 1/2 tablet for 1 week.  If not benefit then 1 tablet daily with 350 calories  04/13/23 urgent appt note: with [facility] 04/05/23 for dep with SI Worst I've ever been.  Feels out of control.  Shaking .  Can't sleep.  Feels disconnected.   Hated being hosp bc locked up.   Taking quetiapine  100 hS and lorazepam and still can't sleep. Started sertraline  50 and stopped Lexapro . Quetiapine  100 mg HS Plan: Stop hydroxyzine Increase quetiapine  200 then 300 mg at night for sleep and depression  Stop lorazepam and return to alprazolam  1 mg at night for sleep Increase sertraline  to 100 mg daily or 2 of 50 mg nightly  04/23/23 appt noted: Here for first appt with Spravato .  Highly anxious about getting Spravato  today.  Doesn't like feeling out of control and worries over this. Still very depressed.  Doesn't think her sx as noted are due to depression.  Thinks she has a neuro problem.  But she  has no other neuro sx like numbness, weakness, balance px, or anything other than dep and anxiety sx. Received Spravato  56 mg today.  It was horrible! Bc she felt out of control and was anxious receiving it.  Thought it would last forever re: dissociation.  It was scary to her.  Couldn't relax for it.  No N, V, HA.  Ambivalent about continuing Spravato  but open. No specific concerns with meds.    04/25/23 appt noted: Needed Xanax  before visit today to overcome fear of coming to the appt. She was more disciplined about controlling neg thought during Spravato  admin and had a much better experience.  It was not scary nor associated with sig fear.  She is still dep and anxious without change otherwise since seen a couple of days ago. Tolerating med changes from last week.   Received Spravato  56 mg again and tolerated it without adverse SE.  Typical SE dissociation, no NV, palpitations, HA.  Motivated to continue the Spravato  and agrees to increase it.  Better experience than the first time. Plan: Increase quetiapine  to 400 mg HS  04/26/23 TC:  Patient taking 300 mg of Seroquel  and 1 mg of Xanax  for sleep. She can get to sleep, just can't stay asleep. Doesn't say how long she is sleeping, but says she needs 9 hours of sleep. Reporting social isolation is bad, she won't leave the house, but doesn't like being home alone.  Reports she has never been this sick mentally and feels like she may need to go to the hospital. No SI, just doesn't like being alone.     MD resp:  Pt just seen yesterday and received 2nd Spravato  56 mg .  That was positive experience. However she is still severely depressed and ruminating and reassurance  seeking..  She doesn't need to go to the hospital. She told me yesterday she slept 7 hours but needs 9 hours.  7 hours is not bad.   She is taking quetiapne 300 mg HS with Xanax  1 mg HS.  No increase in Xanax  but can increase quetiapine  to 400 mg HS (max dose 800) and this could help  sleep and anxiety and depression. She should keep appt next week for next Spravato .  In case she asks, Spravato  is not making her worse in any way.     05/01/23 appt noted;Andrea [name] present also Current meds; quetiapine  400 mg HS, sertraline  100 mg daily, alprazolam  0.5 mg TID and 1 mg HS. Tolerating med changes.    Received Spravato  84 mg first time.  Typical SE dissociation, no NV, palpitations, HA.  However she had a bad experience DT severe anxiety before starting the Spravato .   Very fearful, scared.  Without specific reasons.  Severe anxiety dep, hopeless.  No SI but doesn't want to live like this.  So anxious hard to sleep and some crying spells. Seroquel  not helping sleep.  05/03/23 appt noted: Current meds; quetiapine  400 mg HS, sertraline  100 mg daily, alprazolam  0.5 mg TID and 1 mg HS. Tolerating med changes.    Received Spravato  84 mg.  Typical SE dissociation, no NV, palpitations, HA.  However she had a bad experience DT severe anxiety before starting the Spravato .   Highly anxious, fearful of everything including Spravato  admin.  Afraid she will forget who she is despite having it before.  Still anxious and trouble sleeping at home.  Sleep no better with quetiapine  and still ruminatiing.   Plan increase quetiapine  to 500 mg HS  05/08/23 appt noted: Meds: quetiapine  500 mg HS, sertraline  100 mg daily, alprazolam  0.5 mg TID and 1 mg HS. Received Spravato  84 mg 3rd time.  Typical SE dissociation, no NV, palpitations, HA.  However she had a bad experience DT severe anxiety before starting the Spravato .   She is not better so far.  Ongoing severe depression anxiety, avoidance, anhedonia, rumination, afraid to be alone. All not typical of herself.   No SE with meds except constipation and sweating at night.  Plan:  Increase quetiapine  to 600 mg at night for sleep and depression & rumination and next week to 600 HS increased alprazolam  1 mg at night for sleep and 0.5 mg TID Increase sertraline   150 mg daily  Spravato  84 mg AM twice weekly for severe TRD  05/10/23 appt noted:  Meds: quetiapine  600 mg HS, sertraline  150 mg daily, alprazolam  0.5 mg TID and 1 mg HS. Received Spravato  84 mg 4th time.  Typical SE dissociation, no NV, palpitations, HA.  However she had a bad experience DT severe anxiety before starting the Spravato .   She is not better so far.  Ongoing severe depression anxiety, avoidance, anhedonia, rumination, afraid to be alone. All not typical of herself.   SE constipation she finds unmanageable  Did sleep better with increased quetiapine  600 mg Hs but cannot continue dT constipation.  However dep and anxiety are no better with Spravato  or the other treatments except better sleep.  Anxiety ongoing severe with rumination.  05/17/23 appt noted:  Meds: switched to olanzapine  15 mg HS, sertraline  150, alprazolam   Tremor seems worse but Andrea [name] disagrees. Mood continually worse.  She is dependent on Andrea [name] and afraid to be alone.  No sweating. Xanax  helped but wears off after a couple of hours.  She is  open to antoher option Tolerating med changes. No improvement except slept a little better last night but still not enough. No Spravato  today bc 2 weeks of it didn't help.  Agrees to pursue ECT but hoping med changes will help Crying spells.  07/10/23 appt noted: Great.  Gerlean back at work.  Playing golf again.   Med: sertraline  200, propranolol  10 BID, olanzapine  15 pm, clonazepam  1 mg HS Did not require ECT.  All of a sudden something clicked and gradually better .  Doesn't even recognize the person she was with depression.   No negative thoughts.  Laughing again.  More social and talking again.  Happy in the am.  Sleep very well 10-11 hours.  Everything back to normal .  Doesn't need Gerlean with her now.  Ok being alone.  Anxiety gone also. SE carb craving and wt gain 10#.   Plan: Propranolol  10 mg tablet 2-3 tablets twice daily as needed for tremor Continue clonazepam  1 mg tablet  1/2 twice daily and 1 tablet at night Continue sertraline  200 tablets daily. Switch olanzapine  15 to Lybalvi 10 DT wt gain.    08/10/23 appt noted: Psych med: sertraline  200, propranolol  10 BID, Lybalvi 10 pm, clonazepam  1 mg HS Up to 141# with Lybalvi.  Went to wt reducing clinic and got shot semaglutide.  $75/ week.  That took appetite away.   Was a little more down after a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 option is like MAOI, like Parnate .  High risk med managment  Disc parnate  SE and interactions with food and meds. Gave written info sheet on MAOI restrictions.   Discussed potential metabolic side effects associated with atypical antipsychotics, as well as potential risk for  movement side effects. Advised pt to contact office if movement side effects occur.   She got [employer] to determine if high metabolizer.  Extensive review with patient.  Poor folate metabolism.  Otherwise unremarkable. Deplin 15 mg daily with samples.   Disc criteria for hosp and option if gets any worse.  Doing online counseling.  Not much change.   2-4 stool softeners daily with Miralax daily  Use alprazolam  0.5 mg at night if needed for sleep  Rec second opinion for TRD consultation.  She is planning to get at [facility].    Plan: continue Parnate  40 mg daily.  Can go higher if needed.  Tolerated. Stop olanzapine  Reduce quetiapine  to 1 and 1/2 tablets at night. About Jan 7 reduce quetiapine  to 1 tablet at night  stop olanzapine  to 2.5 mg daily.  FU 4 wks  Lorene Macintosh, MD, DFAPA     Future Appointments  Date Time Provider Department Center  09/23/2024  2:20 PM [facility] [place] VISIT 1 [facility] [address]  10/14/2024  2:30 PM Cottle, Lorene KANDICE Raddle., MD [facility] None       No orders of the defined types were placed in this encounter.      -------------------------------me

## 2024-09-25 ENCOUNTER — Other Ambulatory Visit: Payer: Self-pay | Admitting: Psychiatry

## 2024-09-25 DIAGNOSIS — F339 Major depressive disorder, recurrent, unspecified: Secondary | ICD-10-CM

## 2024-09-29 ENCOUNTER — Telehealth: Payer: Self-pay | Admitting: Psychiatry

## 2024-09-29 NOTE — Telephone Encounter (Signed)
 Reviewed recommendations with patient. Previously was told take AM and noon. She reports having gone to the [place] and was crying all the time, couldn't laugh or enjoy herself.

## 2024-09-29 NOTE — Telephone Encounter (Signed)
"  Please see message.   "

## 2024-09-29 NOTE — Telephone Encounter (Signed)
 We stopped olanzapine  2.5 mg HS and reduced quetiapine  to 450 mg HS.  We need to try to continue these reductions if possible for SE reasons.   To help mood more increase Parnate  to30 mg in AM and 20 mg PM (total 50 mg daily).   Lorene Macintosh, MD, DFAPA

## 2024-09-29 NOTE — Telephone Encounter (Signed)
 Pt states she has been having a hard time. States Dr. Geoffry cut back on a lot of her meds and its got her all messed up.

## 2024-10-01 ENCOUNTER — Other Ambulatory Visit: Payer: Self-pay | Admitting: Psychiatry

## 2024-10-01 ENCOUNTER — Other Ambulatory Visit: Payer: Self-pay

## 2024-10-01 DIAGNOSIS — F5105 Insomnia due to other mental disorder: Secondary | ICD-10-CM

## 2024-10-01 NOTE — Telephone Encounter (Signed)
 Patient called in for refill on Xananx 0.5mg . PH: [phone] Appt 1/20 Pharmacy [facility] [address] [place], [place]

## 2024-10-01 NOTE — Telephone Encounter (Signed)
 Addressed via pharmacy interface

## 2024-10-07 ENCOUNTER — Other Ambulatory Visit: Payer: Self-pay | Admitting: Psychiatry

## 2024-10-07 NOTE — Telephone Encounter (Signed)
 LVM to [facility] - have to ask her about her Seroquel 

## 2024-10-08 ENCOUNTER — Telehealth: Payer: Self-pay

## 2024-10-08 NOTE — Telephone Encounter (Signed)
 Spoke with pt, explained your message to her, she said Thank you, she was not going to do anything without your approval.

## 2024-10-08 NOTE — Telephone Encounter (Signed)
 6 of the Parnate  10 mg tablets is the most common dose, so I agree with the increase.  I see her next week.

## 2024-10-14 ENCOUNTER — Encounter: Payer: Self-pay | Admitting: Psychiatry

## 2024-10-14 ENCOUNTER — Ambulatory Visit: Admitting: Psychiatry

## 2024-10-14 DIAGNOSIS — F339 Major depressive disorder, recurrent, unspecified: Secondary | ICD-10-CM | POA: Diagnosis not present

## 2024-10-14 DIAGNOSIS — F5105 Insomnia due to other mental disorder: Secondary | ICD-10-CM

## 2024-10-14 DIAGNOSIS — F411 Generalized anxiety disorder: Secondary | ICD-10-CM

## 2024-10-14 MED ORDER — TRANYLCYPROMINE SULFATE 10 MG PO TABS: ORAL_TABLET | ORAL | 0 refills | Status: AC

## 2024-10-14 MED ORDER — QUETIAPINE FUMARATE 50 MG PO TABS: 50.0000 mg | ORAL_TABLET | Freq: Every day | ORAL | 1 refills | Status: AC

## 2024-10-14 NOTE — Progress Notes (Signed)
 Andrea Andrea [name] [id number] 02-13-1951 73 y.o.   Subjective:   Patient ID:  Andrea Andrea [name] is a 74 y.o. (DOB [birth year]) female.  Chief Complaint:  Chief Complaint  Patient presents with   Follow-up   Depression   Anxiety    HPI Andrea Andrea [name] presents to the office today for follow-up of MDE and anxiety disorder.  seen 09/2019.  No meds were changed.  Been on Lexapro  20 since early 2017.  02/27/2020 phone call from patient: Pt experiencing more anxiety than she normally has. Pt would like to know what are some options that she has as far as her medication. Pt will be in an appt  MD response: If the anxiety is just occasional then using alprazolam  as needed would be an okay thing to do.  If it is been fairly persistent for a couple of weeks or more than the simplest solution would be to increase the Lexapro  to 1-1/2 tablets daily.  Historically she has done well on Lexapro  20 mg daily for an extended period of time.  If she is under temporary stress then once that is resolved we could drop it back to 1 daily.  If this does not work as expected then she should schedule an earlier appointment. Pt response: Patient called back and she's been having temporary stress, but it's not bad. Things are great, she's working out and feeling okay for the most part. It's been going on for a bit so she agrees to the increase Lexapro  20 mg 1.5 tablets daily. She has been taking alprazolam  at hs occasionally to help sleep better and it does help.   12/06/2020 appointment with the following noted:  No covid.  H Covid and recovered.   Had increased Lexapro  to 30 mg for 90 days and saw a difference but started seeing weight gain and was doing oK and able to drop back and been OK. Still renovating a house for a year.  Sold her house and mourns the house. Calmed down.  Kept 3 gkids and dogs 10 days.   Andrea Andrea Andrea [name] for new house May 1. Rare alprazolam  for HS. Stress moving to condo and would have crying spell over the move  and some problems she was running into dealing with it.  Hard with change.  But taking time to adjust to the idea of moving from her house of 23 years.  Initially refused but he left it up to her and she's come to peace about it.  Andrea Andrea [name].   Good response to medication and no SE.  Exercising is good medicine.  If doesn't then doesn't feel as good. Satisfied. Plan: no med changes  08/01/21  appt noted:  seen with D Andrea Andrea [name] Mo died 07-25-25.  A lot of ups and downs per D.  Hard dips. Got worse when retired and dealing with the new  house.  Had trouble getting rid of things from the old house causing regrets over sellling the house.  D notes doesn't do well with instability  and stressors.  Ruminates on past mistakes. Blamed Andrea [name] for the  difficulty renovating the house.  Neg self talk and beats herself up. When not enough sleep will get depressed.  Does better if busy.   Dreads things.  Patient denies difficulty with sleep initiation or maintenance. Denies appetite disturbance.  Patient reports that energy and motivation have been good. Patient denies any difficulty with concentration.  Patient denies any suicidal ideation.  Plan: Abilify  2.5 mg  daily for a week, then 5 mg daily. Continue Lexapro  20 mg daily  08/31/2021 appt noted: It worked immediately.  Insomnia with EMA. Normally 9 hours.  It's like I'm high.  Not tired.  Average 3-4 hours. Not depressed and no crazy negative thoughts.  Good response.  Energetic but up in middle of night. Plan: Okay to stop Abilify  because of insomnia.   09/29/2021 appointment with the following noted: Parties were great.  Sleep problem resolved.  A lot of rest at the beach. Maybe the last week or 2 less motivation and socialization.  Not laying in the bed.  Just not as good as she was.  Not as outgoing as normal. No alcohol now.  Only had 1-2 at night.  Not ruminating.   Willing to restart Abilify  at lower dose 2 mg daily. 3 grandsons  Plan: Relapse  depression recurs she can resume Abilify   but lower dose 2 mg daily or every other day to try to avoid the insomnia where she can contact our office and we can discussed the possibility of an alternative antidepressant such as Trintellix  or duloxetine .  12/28/2021 appointment noted: Several phone calls since she was here.  Abilify  plus Lexapro  failed.  Switch to duloxetine  90 mg which she started 12/02/2018 2023 Started lamotrigine . Also started Rexulti  Sister has had cycling depression that has responded well to lamotrigine  with poor response to SSRIs [name] with a little dementia. SE tremor 90 mg duloxetine , ? Wt gain over a few weeks.. Exercise and wt control Social isolation, low self esteem, worry about the way she looks and not usually that way. Crying better with duloxetine .  More dependent on H.   Anhedonia.  Black hole.  Never this low. No SI Having to use Xanax .   Asks about day treatment and TMS Plan: rec TMS Continue lamotrigine  as prescribed Increase Rexulti  to 1 mg daily Switch to Trintellix :  reduce duloxetine  to 2 of the 30 mg capsules and start Trintellix  5 mg daily for 5 days,  Then Increase Trintellix  to 10 mg daily and reduce duloxetine  to 1 capsule for 1 week. Then stop duloxetine .  02/15/22 appt noted: On Trintellix  , Rexulti  1 and lamotrigine  I feel good.  Walks 3 miles daily is great for mental health. TMS would not be covered by [facility]. Started feeling better after a week or 2 after the last visit. SE appetite increased. No nausea. Not crying anymore. Sleeping well and no longer ruminates. Plan: Continue Trintellix  but DC Rexulti  due to weight gain.  Continue lamotrigine   03/06/2022 appointment with the following noted: Last 2 weeks has been very sad and nervous. Trintellix  was increased to 20 mg daily  03/10/2022 complaining of ongoing depression, feeling jittery, negative thinking, early morning awakening, ruminating about past decisions.  Wanting to consider TMS  because she is desperate for improvement. Plan we will schedule urgent appointment  03/15/22 urgent appt noted:  seen with Andrea Andrea [name] Very depressed, worst ever.  Don't want to be alone, ongoing worry, ruminating on past decisions.  Racing negative thoughts.  No motivation.  Trouble staying asleep.  Getting 4 hours and used to 9 hours. Dread getting up. Using Xanax  Increased Trintellix  to 20 mg 9 days ago. H agrees sleep is critical. Isolating.   Need to be better by July 11. Plan: Clonazepam  1 mg HS. Continue trintellix  20 mg daily she is only been on this dose 9 days and it needs more time. Re lamotrigine  and increase to 150 mg daily.Andrea Andrea [name]  Vraylar  1.5 mg every other day Option TMS  03/24/22 appt noted: So much better.  Thinking straighter and not negative and not sad.  Sleeping better 8-9 hours.. No crying. H notices progressively better every day.  Mind clarity is much better.   No SE noted.  No hangover.  No nausea. Found a therapist, Andrea Andrea [name].   Feels well enough to go to [place] and kind of excited.  Back July 16.   Plan: Clonazepam  1 mg HS. This resolved insomnia without SE Continue trintellix  20 mg daily she is only been on this dose 20 days and it needs more time. Re lamotriginecontinue 150 mg daily..    Vraylar  1.5 mg every other day Option TMS  04/12/22 appt noted: Great.  Went to [place].  Was herself and enjoyed it.  Anxiety was managed.   [facility] and liked her. 3# wt gain.   Patient reports stable mood and denies depressed or irritable moods.  Patient denies any recent difficulty with anxiety.  Patient denies difficulty with sleep initiation or maintenance. Denies appetite disturbance.  Patient reports that energy and motivation have been good.  Patient denies any difficulty with concentration.  Patient denies any suicidal ideation. Sleeping well than not anxious. Plan: Trintellix  20  07/10/22 TC depression returned recently.  08/15/22 appt  noted: Still blunted  and diminished motivation.  Some days better than others.  Going to gym.  Partially better.   Still has a lot of anxiety and doesn't want to be alone. Functioning but not normal.  Dreads parties.   SE tremor for a couple of week.sleep great.  Dep 7/10. Current Vraylar  1.5 mg daily, increase lamotrigine  200 mg daily, Trintellix  20 , clonazepam  0.5 mg HS Plan: Reduce Clonazepam  1/2  mg HS. This resolved insomnia without SE Continue trintellix  20 mg daily only mildly effective. lamotrigine  continue 200 mg daily..    DC Vraylar  DT lost response Auvelity  Stop Vraylar  After Thanksgiving stop Trintellix  Wait 3 days then Start Auvelity  1 in the morning for 1 week,  and if no side effects then increase Auvelity  to 1 in the AM and 1 in the PM  09/04/22 TC: Complaining of persistent depression and wanting to do something else to help.  Added lithium  CR 300 mg nightly  09/11/2022 phone call complaining of no improvement with Auvelity  and lithium .  Appointment was moved up.  09/13/22 appt noted: Nothing changed. No better and no worse.  Awakens sad and tearful.  Sleeps well. Pushing herself to the gym.  Has a trainer. On clonazepam  0.5 mg HS She remains anxious when alone for no apparent reason.  Feelings of inferiority.  She is normally extroverted but now she is wanting to isolate from people.  All tasks seem monumental.  No enjoyment and not looking forward to anything.  Everything is a personal assistant.  She remains generally worried and anxious which is not typical for her. Plan: Clonazepam  1/2  mg HS. This resolved insomnia without SE Stop Auvelity  Start nortriptyline  1 of the 25 mg capsules at night for 4 nights then 2 at night for 4 nights then 3 at night, then wait 1 week and get the blood test in the morning at [facility] Reduce lamotrigine  to 1 and 1/2 tablets daily  Lamotrigine  has not been helpful to him 100 mg daily.  Start reducing lamotrigine  150 mg daily and will continue to  taper at next appointment.  09/27/21 urgent appt :  she wanted urgent appt bc desperate to  feel better.  Seen with H Called at least twice since here for the same reasons of depression. Not sleeping well even with clonazepam  1 mg HS. Doesn't want to be alone. Shakey.   On lithium  300 mg daily, nortriptyline  75 mg HS.   Doesn't want to live like this but not acutely suicidal. Tolerating meds. Plan: pursue [facility] protocol TMS at [facility] bc faster optin than alternatives  10/30/22 TC: finished 50 TMS tx in the week and wants appt ASAP bc still struggling with crying and depresssion..  11/03/22 urgent appt : Completed TMS last week. Tue-Thur better days and then Friday nose-dived. Not done well since got back.   H notes high anxiety and crying in AM and PM and doesn't want to be alone.  Xanxax seems to help. Xanax  helps and taking 0.5 mg AM and 1 mg HS H notes memory is sharper and she's more alert after treatment. Very anxioius all the time.  Get up sad.  Exercising. Sleep good with Xanax  1 mg HS. Plan: Increase Lexapro  20 mg dailly (should help crying and maybe anxiety but unlikely to resolve depression) Increase alprazolam  to 0.5 mg TID and 1 mg HS for severe anxiety. H notes it helps. Pramipexole  0.25 BID for 5 days and if NR then 0.5 mg Bid off label. Reduce lamotrigine  100 mg for 2 weeks then 50 mg daily for 2 weeks then stop it. She wants to pursue Spravato  if pramipexole  fails  11/17/22 emergency work in appt: Pramipexole  was not sent in and MD not aware until yesterday.  She is not any better and is desperate for a med change. Sleeping well and taking Xanax  1 mg HS Mornings are worse.  Not crying much.  Last Friday was a bad day and she didn't want to be alone.  But did let her H play golf once this week. Reduced lamotrigine  today to 50 BID and weaning off. H says better this week than last week. Increased Lexapro  to 20 mg daily. Plan: Increased Lexapro  20 mg dailly (helped crying  and maybe anxiety but unlikely to resolve depression) Continue alprazolam  to 0.5 mg TID prn anxiety and 1 mg HS. H notes it helps. Pramipexole  0.25 mg BID for 3 days then 0.5 mg BID Reduce lamotrigine  50 mg daily for 2 weeks then stop it. She wants to pursue Spravato  if pramipexole  fails  12/25/22 appt urgently. COMPLETED 1 WEEK TMS [facility] without benefit except TMS took away brain fog.   She feels need to take Xanax  0.5 mg TID  Some sleepiness with Xanax  . Mornings are better and fine while at the gym but when home gets depressed again.   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 Needs Xanax  to do social things. Plan: Increased Lexapro  20 mg dailly (helped crying and maybe anxiety but unlikely to resolve depression) Continue alprazolam  to 0.5 mg TID prn anxiety and 1 mg HS. H notes it helps. Change pramipexole  0.5mg  tablets, 1 tablet four times daily for 1 week,  Then if not better 1and 1/2 tablets in the AM and dinner and 1 tablet at lunch and dinner.    4/9-4/15 hosp for ruptured appendix and missed meds: Can restart Lexapro  at 1/2 tablet daily for 3 days then 1 daily. Restart 1/2 pramipexole  tablet 3 times daily for 3 days then 1 tablet 3 times daily for 3 days then 1 and 1/2 tablet 3 times daily.      01/12/23 appt noted: No GI sx now.  No appetite but  working on protein.   I think I'm doing real well mentally.  Has resumed meds.  Mood has been great. Pramipexole  0.5mg   1 and 1/2 TID Lexapro  20 Xanax  reduced to 1 mg HS H sees a difference.   No SE with meds.   No med changes  01/25/23 appt noted: Meds:  pramipexole  0.75 mg TID, Xanax  1.5 mg HS and 0.5 mg prn, Lexapro  20 mg daily. Doing great.  Staples removed and healing from surgery appendectomy. Back in the gym helps her.   Satisfied with meds. Getting out socially and feeling like her old self.  Andrea Andrea [name]'s H Andrea Andrea [name] went to ER vomiting blood.   Plan: no med changes  02/07/23 appt noted: Back from [facility] yesterday.  Had ear infection.   Then conjunctivitis.  Eyes hurt.   Before the surgery was getting better and feels like she is sliding back some.  Gradually a little worse since here.  Wonders if she could get better with joy and social drive.   Off Abx .  Sleep is good with meds.  With depression then gets anxious too.  Is functioning.   Meds:  pramipexole  0.75 mg TID, Xanax  1.5 mg HS and 0.5 mg prn, Lexapro  20 mg daily. Consistent and no SE. Plan: Continue Lexapro  20 mg dailly (helped crying and maybe anxiety but unlikely to resolve depression) Continue alprazolam   1 mg HS. H notes it helps. Increase pramipexole  0.5mg  tablets, 2 tablets 3 times daily  01/31/23 TC:Con Andrea Andrea [name], CMA  to Me     02/20/23  4:56 PM Note Patient reporting she is not feeling as good as she thinks she should. Her anxiety is the biggest concern. She said she can go to the gym in the mornings and does ok, but she had a closing to go to recently and had to take 1/4 of a Xanax , reports it took the edge off. She reports daily tremors that are helped by Xanax . She is not crying, she is getting things done, sleeping okay. Will have her grandchildren next week and she isn't excited about it like she should be.    Taking 6 mirapex , 1.5 Xanax  and Lexapro  20 mg.     MD resp:  CC   02/20/23  5:48 PM Note We just increased the pramipexole  recently and I see her next week.  I do not want to increase the medicine any further until I see her again.      03/02/23 urgent appt noted: Some improvement with incr pramipexole  1 mg TID. Before felt worse in AM now feels better in AM and goes to gym. Otherwise no excitement.  Hard to make decisions. Less social and smiling.  Subdued more than normal.  Not confident.  Not enjoying present and ruminates in past.   No SE.  No impulsivity Has tremor for awhile .  Is mild for a month. Hard to conc.  Brain fog was better with TMS but is back. Continue Lexapro  20 mg dailly (helped crying and maybe anxiety but unlikely  to resolve depression) Continue alprazolam   1 mg HS. H notes it helps. Increase pramipexole  1 mg QID times daily  03/16/23 appt noted: Meds as above SE: If not enough food makes her excitable, urgent, talking fast.  Does it a bit too much.  Energy fine.   Feels better in the AM at the gym and when home a dark shadow comes over her.   Still feels dep overall.  Went to work with friends and  didn't feel natural and didn't enjoy it like she should.  Pushes herself to stay active.   Takes 1.5 mg Xanax  HS and then awakens and takes another 0.5 mg HS but gets enough sleep. A lot of brain fog.  Feels inferior and doesn't usually feel that way. Infrequent crying spells. Plan:  Continue Lexapro  20 mg dailly (helped crying and maybe anxiety but unlikely to resolve depression) Continue alprazolam   1 mg HS. H notes it helps. Reduce pramipexole  1 mg TID for 3 days, then 0.5 mg QID for 3 days, then 0.5 mg BID for 3 days, then 0.25 mg BID for 3 days, then stop it. Now start Latuda  (lurasidone ) 40 mg 1/2 tablet for 1 week.  If not benefit then 1 tablet daily with 350 calories  04/13/23 urgent appt note: with [facility] 04/05/23 for dep with SI Worst I've ever been.  Feels out of control.  Shaking .  Can't sleep.  Feels disconnected.   Hated being hosp bc locked up.   Taking quetiapine  100 hS and lorazepam and still can't sleep. Started sertraline  50 and stopped Lexapro . Quetiapine  100 mg HS Plan: Stop hydroxyzine Increase quetiapine  200 then 300 mg at night for sleep and depression  Stop lorazepam and return to alprazolam  1 mg at night for sleep Increase sertraline  to 100 mg daily or 2 of 50 mg nightly  04/23/23 appt noted: Here for first appt with Spravato .  Highly anxious about getting Spravato  today.  Doesn't like feeling out of control and worries over this. Still very depressed.  Doesn't think her sx as noted are due to depression.  Thinks she has a neuro problem.  But she has no other neuro sx like  numbness, weakness, balance px, or anything other than dep and anxiety sx. Received Spravato  56 mg today.  It was horrible! Bc she felt out of control and was anxious receiving it.  Thought it would last forever re: dissociation.  It was scary to her.  Couldn't relax for it.  No N, V, HA.  Ambivalent about continuing Spravato  but open. No specific concerns with meds.    04/25/23 appt noted: Needed Xanax  before visit today to overcome fear of coming to the appt. She was more disciplined about controlling neg thought during Spravato  admin and had a much better experience.  It was not scary nor associated with sig fear.  She is still dep and anxious without change otherwise since seen a couple of days ago. Tolerating med changes from last week.   Received Spravato  56 mg again and tolerated it without adverse SE.  Typical SE dissociation, no NV, palpitations, HA.  Motivated to continue the Spravato  and agrees to increase it.  Better experience than the first time. Plan: Increase quetiapine  to 400 mg HS  04/26/23 TC:  Patient taking 300 mg of Seroquel  and 1 mg of Xanax  for sleep. She can get to sleep, just can't stay asleep. Doesn't say how long she is sleeping, but says she needs 9 hours of sleep. Reporting social isolation is bad, she won't leave the house, but doesn't like being home alone.  Reports she has never been this sick mentally and feels like she may need to go to the hospital. No SI, just doesn't like being alone.     MD resp:  Pt just seen yesterday and received 2nd Spravato  56 mg .  That was positive experience. However she is still severely depressed and ruminating and reassurance seeking..  She doesn't need to  go to the hospital. She told me yesterday she slept 7 hours but needs 9 hours.  7 hours is not bad.   She is taking quetiapne 300 mg HS with Xanax  1 mg HS.  No increase in Xanax  but can increase quetiapine  to 400 mg HS (max dose 800) and this could help sleep and anxiety and  depression. She should keep appt next week for next Spravato .  In case she asks, Spravato  is not making her worse in any way.     05/01/23 appt noted;H present also Current meds; quetiapine  400 mg HS, sertraline  100 mg daily, alprazolam  0.5 mg TID and 1 mg HS. Tolerating med changes.    Received Spravato  84 mg first time.  Typical SE dissociation, no NV, palpitations, HA.  However she had a bad experience DT severe anxiety before starting the Spravato .   Very fearful, scared.  Without specific reasons.  Severe anxiety dep, hopeless.  No SI but doesn't want to live like this.  So anxious hard to sleep and some crying spells. Seroquel  not helping sleep.  05/03/23 appt noted: Current meds; quetiapine  400 mg HS, sertraline  100 mg daily, alprazolam  0.5 mg TID and 1 mg HS. Tolerating med changes.    Received Spravato  84 mg.  Typical SE dissociation, no NV, palpitations, HA.  However she had a bad experience DT severe anxiety before starting the Spravato .   Highly anxious, fearful of everything including Spravato  admin.  Afraid she will forget who she is despite having it before.  Still anxious and trouble sleeping at home.  Sleep no better with quetiapine  and still ruminatiing.   Plan increase quetiapine  to 500 mg HS  05/08/23 appt noted: Meds: quetiapine  500 mg HS, sertraline  100 mg daily, alprazolam  0.5 mg TID and 1 mg HS. Received Spravato  84 mg 3rd time.  Typical SE dissociation, no NV, palpitations, HA.  However she had a bad experience DT severe anxiety before starting the Spravato .   She is not better so far.  Ongoing severe depression anxiety, avoidance, anhedonia, rumination, afraid to be alone. All not typical of herself.   No SE with meds except constipation and sweating at night.  Plan:  Increase quetiapine  to 600 mg at night for sleep and depression & rumination and next week to 600 HS increased alprazolam  1 mg at night for sleep and 0.5 mg TID Increase sertraline  150 mg daily   Spravato  84 mg AM twice weekly for severe TRD  05/10/23 appt noted:  Meds: quetiapine  600 mg HS, sertraline  150 mg daily, alprazolam  0.5 mg TID and 1 mg HS. Received Spravato  84 mg 4th time.  Typical SE dissociation, no NV, palpitations, HA.  However she had a bad experience DT severe anxiety before starting the Spravato .   She is not better so far.  Ongoing severe depression anxiety, avoidance, anhedonia, rumination, afraid to be alone. All not typical of herself.   SE constipation she finds unmanageable  Did sleep better with increased quetiapine  600 mg Hs but cannot continue dT constipation.  However dep and anxiety are no better with Spravato  or the other treatments except better sleep.  Anxiety ongoing severe with rumination.  05/17/23 appt noted:  Meds: switched to olanzapine  15 mg HS, sertraline  150, alprazolam   Tremor seems worse but H disagrees. Mood continually worse.  She is dependent on H and afraid to be alone.  No sweating. Xanax  helped but wears off after a couple of hours.  She is open to antoher option Tolerating med  changes. No improvement except slept a little better last night but still not enough. No Spravato  today bc 2 weeks of it didn't help.  Agrees to pursue ECT but hoping med changes will help Crying spells.  07/10/23 appt noted: Great.  Andrea Andrea [name] back at work.  Playing golf again.   Med: sertraline  200, propranolol  10 BID, olanzapine  15 pm, clonazepam  1 mg HS Did not require ECT.  All of a sudden something clicked and gradually better .  Doesn't even recognize the person she was with depression.   No negative thoughts.  Laughing again.  More social and talking again.  Happy in the am.  Sleep very well 10-11 hours.  Everything back to normal .  Doesn't need Andrea Andrea [name] with her now.  Ok being alone.  Anxiety gone also. SE carb craving and wt gain 10#.   Plan: Propranolol  10 mg tablet 2-3 tablets twice daily as needed for tremor Continue clonazepam  1 mg tablet 1/2 twice  daily and 1 tablet at night Continue sertraline  200 tablets daily. Switch olanzapine  15 to Lybalvi 10 DT wt gain.    08/10/23 appt noted: Psych med: sertraline  200, propranolol  10 BID, Lybalvi 10 pm, clonazepam  1 mg HS Up to 141# with Lybalvi.  Went to wt reducing clinic and got shot semaglutide.  $75/ week.  That took appetite away.   Was a little more down after a couple of missed doses of Lybalvi. Mood had been great  until the missed dose.  Sleeping a lot.  No problems with change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 tablet   45 min session Recent severe depression with severe anxiety associated and Severe rumination.  Failed several meds with TRD.  NR TMS.  No reason for depression.  Completely resolved  with sertraline  200 and olanzapine  15 mg added but then relapsed.. It is likely that it was the olanzapine  rather than the increase in sertraline  to the dramatic and relatively rapid response and resolution of the depression and anxiety.  But unfortunately relapsed again. As of May 2025 depression and  sx anhedonia and social avoidance resolved.  As noted in hx, dep recurred when stopped olanzapine  and resolved when added 5 mg back.  Then dep relapsed post-surgical after hysterectomy.   She has failed multiple meds as noted above including all the usual categories of antidepressants with the exception of  MAO inhibitors.  Consider venlafaxine,  higher dose Trintellix  or other meds; like Parnate .   Disc SE and drug interactions. BC resp Ritalin  consider switch selegiline to eliminate polypharmacy.   ECT option but she'd rather try more med options first.    Most effective option is like MAOI, like Parnate .  High risk med managment  Disc parnate  SE and interactions with food and meds. Gave written info sheet on MAOI restrictions.   Discussed potential metabolic side effects associated with atypical antipsychotics, as well as potential risk for movement side effects. Advised pt to contact office if movement side effects occur.   She got [employer] to determine if high metabolizer.  Extensive review with patient.  Poor folate metabolism.  Otherwise unremarkable. Deplin 15 mg daily with samples.   Disc criteria for hosp and option if gets any worse.  Doing online counseling.  Not much change.   2-4 stool softeners daily with Miralax daily  Use alprazolam  0.5 mg at night if needed for sleep  Rec second opinion for TRD consultation.  She is planning to get at [facility].    Plan: continue Parnate  60 mg daily.    Tolerated. 1/2 of Seroquel  ER 300 mg nightly for 2 weeks,  Then reduce to Seroquel  50 mg for 2 week s and stop it as long as you can still sleep ok.  FU 4 wks  Lorene Macintosh, MD,  DFAPA     Future Appointments  Date Time Provider Department Center  10/30/2024  3:00 PM [facility] ANNUAL WELLNESS VISIT 2 [facility] [address]       No orders of the defined types  were placed in this encounter.      -------------------------------me

## 2024-10-14 NOTE — Patient Instructions (Signed)
 1/2 of Seroquel  ER 300 mg nightly for 2 weeks,  Then reduce to Seroquel  50 mg for 2 week s and stop it as long as you can still sleep ok.

## 2024-10-28 ENCOUNTER — Telehealth: Payer: Self-pay | Admitting: Psychiatry

## 2024-10-29 ENCOUNTER — Other Ambulatory Visit: Payer: Self-pay

## 2024-10-29 DIAGNOSIS — F5105 Insomnia due to other mental disorder: Secondary | ICD-10-CM

## 2024-10-29 MED ORDER — ALPRAZOLAM 0.5 MG PO TABS: ORAL_TABLET | ORAL | 0 refills | Status: AC

## 2024-10-29 NOTE — Telephone Encounter (Signed)
 Pended

## 2024-10-29 NOTE — Telephone Encounter (Signed)
 Pt needs Xanax  rf     [facility] [address]  [place]

## 2024-10-30 ENCOUNTER — Ambulatory Visit

## 2024-10-30 VITALS — BP 130/80 | HR 75 | Temp 97.9°F | Ht 61.0 in | Wt 152.6 lb

## 2024-10-30 DIAGNOSIS — Z0000 Encounter for general adult medical examination without abnormal findings: Secondary | ICD-10-CM | POA: Diagnosis not present
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
## 2024-10-30 NOTE — Patient Instructions (Signed)
 Ms. Fifer,  Thank you for taking the time for your Medicare Wellness Visit. I appreciate your continued commitment to your health goals. Please review the care plan we discussed, and feel free to reach out if I can assist you further.  Please note that Annual Wellness Visits do not include a physical exam. Some assessments may be limited, especially if the visit was conducted virtually. If needed, we may recommend an in-person follow-up with your provider.  Ongoing Care Seeing your primary care provider every 3 to 6 months helps us  monitor your health and provide consistent, personalized care.   Referrals If a referral was made during today's visit and you haven't received any updates within two weeks, please contact the referred provider directly to check on the status.  Recommended Screenings:  Health Maintenance  Topic Date Due   Hepatitis C Screening  Never done   Pneumococcal Vaccine for age over 79 (2 of 2 - PCV) 10/10/2015   Breast Cancer Screening  01/17/2024   Flu Shot  04/25/2024   COVID-19 Vaccine (4 - 2025-26 season) 05/26/2024   Medicare Annual Wellness Visit  09/20/2024   DTaP/Tdap/Td vaccine (2 - Tdap) 06/23/2029   Colon Cancer Screening  10/13/2030   Osteoporosis screening with Bone Density Scan  Completed   Zoster (Shingles) Vaccine  Completed   Meningitis B Vaccine  Aged Out       10/30/2024    3:19 PM  Advanced Directives  Does Patient Have a Medical Advance Directive? Yes  Type of Estate Agent of [place];Living will  Does patient want to make changes to medical advance directive? No - Patient declined  Copy of Healthcare Power of Attorney in Chart? No - copy requested    Vision: Annual vision screenings are recommended for early detection of glaucoma, cataracts, and diabetic retinopathy. These exams can also reveal signs of chronic conditions such as diabetes and high blood pressure.  Dental: Annual dental screenings help detect early  signs of oral cancer, gum disease, and other conditions linked to overall health, including heart disease and diabetes.  Please see the attached documents for additional preventive care recommendations.

## 2024-10-31 ENCOUNTER — Other Ambulatory Visit: Payer: Self-pay | Admitting: Psychiatry

## 2024-10-31 ENCOUNTER — Telehealth: Payer: Self-pay | Admitting: Psychiatry

## 2024-10-31 DIAGNOSIS — F339 Major depressive disorder, recurrent, unspecified: Secondary | ICD-10-CM

## 2024-10-31 NOTE — Telephone Encounter (Signed)
 Rx was sent 2/4 at 6:10 PM. Pt will call pharmacy, did not show up on the app.

## 2024-10-31 NOTE — Telephone Encounter (Signed)
 Pt called at 11:00a requesting refill of Xanax  to  [id number] [address] [facility] [address] [place] [address] Phone: [phone]  Fax: [phone]

## 2024-11-14 ENCOUNTER — Ambulatory Visit: Admitting: Psychiatry

## 2024-12-17 ENCOUNTER — Ambulatory Visit: Admitting: Psychiatry

## 2025-01-07 ENCOUNTER — Ambulatory Visit: Admitting: Primary Care

## 2025-11-05 ENCOUNTER — Ambulatory Visit
